# Patient Record
Sex: Male | Born: 1945 | Race: White | Hispanic: No | Smoking: Current every day smoker
Health system: Southern US, Community
[De-identification: ages and names within clinical notes are randomized; demographics above are authoritative.]

## PROBLEM LIST (undated history)

## (undated) DIAGNOSIS — L97919 Non-pressure chronic ulcer of unspecified part of right lower leg with unspecified severity: Secondary | ICD-10-CM

## (undated) DIAGNOSIS — I83019 Varicose veins of right lower extremity with ulcer of unspecified site: Secondary | ICD-10-CM

## (undated) DIAGNOSIS — I4729 Other ventricular tachycardia: Secondary | ICD-10-CM

## (undated) DIAGNOSIS — I255 Ischemic cardiomyopathy: Principal | ICD-10-CM

## (undated) DIAGNOSIS — I5022 Chronic systolic (congestive) heart failure: Secondary | ICD-10-CM

## (undated) DIAGNOSIS — D696 Thrombocytopenia, unspecified: Secondary | ICD-10-CM

## (undated) DIAGNOSIS — Z720 Tobacco use: Secondary | ICD-10-CM

## (undated) DIAGNOSIS — Z95810 Presence of automatic (implantable) cardiac defibrillator: Secondary | ICD-10-CM

## (undated) DIAGNOSIS — J9610 Chronic respiratory failure, unspecified whether with hypoxia or hypercapnia: Principal | ICD-10-CM

## (undated) DIAGNOSIS — I272 Pulmonary hypertension, unspecified: Secondary | ICD-10-CM

## (undated) DIAGNOSIS — J449 Chronic obstructive pulmonary disease, unspecified: Secondary | ICD-10-CM

## (undated) DIAGNOSIS — I10 Essential (primary) hypertension: Secondary | ICD-10-CM

## (undated) DIAGNOSIS — I472 Ventricular tachycardia: Secondary | ICD-10-CM

## (undated) DIAGNOSIS — R55 Syncope and collapse: Secondary | ICD-10-CM

## (undated) DIAGNOSIS — L97929 Non-pressure chronic ulcer of unspecified part of left lower leg with unspecified severity: Secondary | ICD-10-CM

## (undated) DIAGNOSIS — I83029 Varicose veins of left lower extremity with ulcer of unspecified site: Secondary | ICD-10-CM

## (undated) DIAGNOSIS — C189 Malignant neoplasm of colon, unspecified: Secondary | ICD-10-CM

## (undated) DIAGNOSIS — I2510 Atherosclerotic heart disease of native coronary artery without angina pectoris: Secondary | ICD-10-CM

## (undated) DIAGNOSIS — I959 Hypotension, unspecified: Secondary | ICD-10-CM

## (undated) HISTORY — PX: CORONARY ANGIOPLASTY: SHX604

## (undated) HISTORY — DX: Tobacco use: Z72.0

## (undated) HISTORY — DX: Ischemic cardiomyopathy: I25.5

## (undated) HISTORY — PX: COLON SURGERY: SHX602

## (undated) HISTORY — DX: Malignant neoplasm of colon, unspecified: C18.9

## (undated) HISTORY — DX: Chronic obstructive pulmonary disease, unspecified: J44.9

---

## 1995-01-30 HISTORY — PX: OTHER SURGICAL HISTORY: SHX169

## 1995-01-30 HISTORY — PX: CORONARY ARTERY BYPASS GRAFT: SHX141

## 2003-09-25 ENCOUNTER — Inpatient Hospital Stay (HOSPITAL_COMMUNITY): Admission: EM | Admit: 2003-09-25 | Discharge: 2003-09-29 | Payer: Self-pay | Admitting: Emergency Medicine

## 2009-03-23 ENCOUNTER — Encounter: Admission: RE | Admit: 2009-03-23 | Discharge: 2009-03-23 | Payer: Self-pay | Admitting: Family Medicine

## 2010-11-16 NOTE — Discharge Summary (Signed)
NAME:  James Moon, James Moon                        ACCOUNT NO.:  [id number]   MEDICAL RECORD NO.:  [id number]                   PATIENT TYPE:  INP   LOCATION:  3702                                 FACILITY:  [facility]   PHYSICIAN:  Jackie Plum, M.D.             DATE OF BIRTH:  March 15, 1946   DATE OF ADMISSION:  09/24/2003  DATE OF DISCHARGE:                                 DISCHARGE SUMMARY   ADDENDUM:  The patient was to be discharged yesterday, however, on account  of downward trend of his platelet count, he was evaluated by hematology.  The etiology of his thrombocytopenia is overall unclear.  Hematology saw the  patient and they recommended __________ and holding aspirin for now until  the patient is seen by his PCP within one week.  Advised hematology followup  should there be further drop in his platelet count at the outpatient level.  The platelet count was then repeated.  The discharge platelet count is as  noted on the previous discharge summary.  This morning, the patient does not  have any complaints.  He does not have any evidence of any bleeding  diathesis.  His vital signs are within normal limits.  The CBG is 106 mg/dl.  Saturation is 92% on room air.  He is discharged home in stable and  satisfactory condition.                                                Jackie Plum, M.D.    GO/MEDQ  D:  09/29/2003  T:  09/29/2003  Job:  [id number]

## 2010-11-16 NOTE — Discharge Summary (Signed)
NAME:  James Moon, James Moon                        ACCOUNT NO.:  [id number]   MEDICAL RECORD NO.:  [id number]                   PATIENT TYPE:  INP   LOCATION:  3702                                 FACILITY:  [facility]   PHYSICIAN:  James Moon, M.D.             DATE OF BIRTH:  Jul 15, 1945   DATE OF ADMISSION:  09/24/2003  DATE OF DISCHARGE:  09/28/2003                                 DISCHARGE SUMMARY   DISCHARGE DIAGNOSES:  1. Diabetic ketoacidosis, resolved.  2. Altered mental status secondary to diabetic ketoacidosis, resolved.  3. Dehydration with prerenal azotemia secondary to diabetic ketoacidosis,     resolved.  4. Hypothermia secondary to diabetic ketoacidosis, resolved.  The TSH was     within normal limits this hospitalization.  5. Hypotension secondary to dehydration with volume depletion, resolved.  6. History of insulin-dependent diabetes.  7. History of coronary artery disease, status post coronary artery bypass     graft 10 years ago.  8. History of colon cancer, status post resection.  9. History of ongoing severe smoking.     a. The patient counseled during hospitalization about the need to stop        cigarette smoking and the effect of smoking on his general well being.        Outpatient continued reinforcement and counseling recommended per        primary care physician.  10.      Thrombocytopenia.  11.      Rhabdomyolysis secondary to infectious etiology, resolving.   DISCHARGE MEDICATIONS:  The patient is being discharged home on regular  insulin 25 units subcutaneously and 25 units of Lente subcutaneously before  breakfast and at bedtime.  The patient is going to continue his aspirin 325  mg p.o. daily.  New medicines include Avelox 400 mg p.o. daily and Protonix  40 mg p.o. daily.   ACTIVITY:  As tolerated.   DIET:  To be a 2000 calorie ADA diet.   SPECIAL INSTRUCTIONS:  The patient is to report to M.D. if he experiences  any problems with chills or  difficulty breathing.   FOLLOWUP:  A followup appointment will be with his primary care physician,  Dr. Dagoberto Moon, early next week.   REASON FOR HOSPITALIZATION:  Altered mental status change and DKA secondary  to acute bronchitis as the exacerbating factor.   HISTORY OF PRESENT ILLNESS:  The patient presented to the [facility] ED on  September 24, 2003, with generalized weakness, fever, poor appetite, and nausea.  He was brought to the ED because he had been found confused on the floor and  could not be engaged in any meaningful conversation.  The history at that  time was mainly given by the patient's daughter on account of mental status  changes.  However, when the patient came around with resolution of his  mental status changes, he indicated having some cough which was productive  of  yellowish sputum prior to the onset of his worsening symptomatology.  He  also noted that he had not been feeling well for several weeks prior to  admission and was seen at an urgent care where it was felt that he had the  flu.  He had been vomiting as well.   PHYSICAL EXAMINATION:  VITAL SIGNS:  On admission, the patient's BP was  84/48 with a respiratory rate of 25 per minute and a temperature of 91.9  degrees Fahrenheit.  GENERAL APPEARANCE:  According to the admission H&P by Dr. Soyla Moon, the  patient had a shaking spell, was incomprehensible, and could not follow  simple commands.  HEENT:  His oropharynx was dry.  NECK:  Exam was notable for vesicular breath sounds.  CARDIAC:  Exam did not reveal any gallops or murmur.  ABDOMEN:  Soft and nontender.  EXTREMITIES:  Exam did not reveal any edema.  His skin exam did not reveal  any rashes.   LABORATORY DATA:  His admission laboratory work was notable for hyponatremia  at 126 with a glucose of 1155 mg/dl.  He had a CO2 of 12.  His liver  function tests were unremarkable.  He had a white count of 17,300 with a  hemoglobin of 16.9, hematocrit 58.6, and  platelet count of 188.  The initial  set of cardiac enzymes were unremarkable.  The EKG showed sinus rhythm at 61  without any obvious acute ST-T changes in contiguous leads.  On account of  hypoglycemia with acidosis with a pH of 7.1 and increased anion gap, the  patient was admitted to the hospital for altered mental status change with  dehydration and volume depletion secondary to diabetic ketoacidosis.   HOSPITAL COURSE:  Problem #1 - DIABETIC KETOACIDOSIS:  He was admitted to a  telemetry bed.  Aggressive IV fluid resuscitation was instituted on account  of his hypothermia.  ____________ was done, which was within normal limits.  He was put on a warming blankets.  Antibiotics were given in view of his  leukocytosis and concerns for possible sepsis.  His x-rays showed no acute  __________, however, there were increased bronchial markings consistent with  acute bronchitis.  The recommended protocol was instituted with IV regular  insulin.  His electrolytes were judiciously monitored and corrected as  needed.  He did not have any evidence of chest pain or evidence of ischemia.  By the next day, the patient's anion gap had closed and appropriate  adjustments in his insulin management were instituted with sliding scale  insulin and Lantus at night.  Urine cultures done on admission.  The final  report came out to be negative without any growth.  Blood cultures drawn on  September 25, 2003, had no growth any bugs at the time of discharge.  The final  report is pending.  It will need to be followed up at the outpatient level.  The patient is feeling well today.  His endurance level and energy level  have improved.  He is able to take a full diet and maintain this without any  problems.  He has been ambulatory without any problems.  His glucose levels  have improved.   Problem #2 - RHABDOMYOLYSIS:  The patient's CPK was more than 10 on admission and came down some during the hospitalization.   The cause of his  mild rhabdomyolysis is believed to be related to an infectious etiology  (acute bronchitis).  Metabolic causes could also be contributory.  The  patient is going home to continue increased fluid intake.  Total CPK and  renal function will need to be evaluated at the first outpatient visit.  The  patient's discharge CPK is 955.   Problem #3 - THROMBOCYTOPENIA:  The patient's platelet count decreased down  to 77,000 ___________ yesterday morning.  I believe that this may be related  to his acute illness.  There is no evidence of any petechiae or any skin  changes.  He does not have any evidence of acute bleeding.  The platelet  count will be repeated at discharge.  We will also take the opportunity to  get a peripheral blood smear with pathology review post discharge.  He will  be discharged home today.  This will be checked further at the outpatient  level.   On rounds this morning, James Moon is feeling well.  He does not have any  complaints of nausea, vomiting, fever, or chills.  He has no chest pain or  shortness of breath.  His BP is 121/68 mmHg.  His pulse rate is 74 per  minute.  His temperature is 97.9 degrees Fahrenheit.  His CBG was 89 mg/dl  this morning.  He is not in acute distress.  His lungs were clear to  auscultation.  The cardiac exam was notable for a regular rate and rhythm  without any gallops or murmur.  The abdomen was soft and nontender.  Bowel  sounds were present.  They were normoactive.  The extremity exam did not  reveal any edema or cords.  He is alert and oriented x 3.  No acute focal  deficits.  Laboratory work drawn at 0500 hours yesterday morning was notable  for resolution of his leukocytosis with a discharge WBC count of 9.2, a  hemoglobin of 16.5, a hematocrit of 48.6, an MCV of 93.9, and a platelet  count of 77.  The sodium was 149, potassium 4.0, chloride 116, CO2 28,  glucose 208, BUN 26, creatinine 1.0, and calcium 8.1.  Total  CPK 955, MB  26.5, troponin I 0.05.  The total cholesterol was 139, triglycerides 160,  HDL 21, and LDL 86.   His DKA has resolved.  He does not have any significant cardiopulmonary  symptomatology.  His hypothermia is also resolved.  He is deemed appropriate  for discharge today after repeat his CBC as noted above.  The patient is  self-pay.  We therefore asked care management to help him with his  medications.   James Moon' daughter had mentioned that he had not been taking good care  of himself.  Therefore, I took this opportunity to discuss all of the workup  done, the rationale behind this workup, the tailored point of care  recommended at the outpatient level, and the need to be compliant with his  medications and dietary restrictions.  He does not wish to be prescribed a  nicotine patch.  Therefore, continued outpatient encouragement and  reinforcement to be instituted as mentioned above.  DISPOSITION:  James Moon is going home.   CONDITION ON DISCHARGE:  Improved.                                                James Moon, M.D.    GO/MEDQ  D:  09/28/2003  T:  09/28/2003  Job:  [id number]   cc:  Alfonse Alpers. James Moon, M.D.  [address]  [place]  [place] [address]  Fax: [phone]

## 2011-06-01 DIAGNOSIS — I255 Ischemic cardiomyopathy: Secondary | ICD-10-CM

## 2011-06-01 HISTORY — DX: Ischemic cardiomyopathy: I25.5

## 2011-06-01 HISTORY — PX: CORONARY STENT PLACEMENT: SHX1402

## 2011-06-07 ENCOUNTER — Encounter: Payer: Self-pay | Admitting: *Deleted

## 2011-06-07 ENCOUNTER — Emergency Department (HOSPITAL_COMMUNITY): Payer: Medicare Other

## 2011-06-07 ENCOUNTER — Other Ambulatory Visit: Payer: Self-pay

## 2011-06-07 ENCOUNTER — Inpatient Hospital Stay (HOSPITAL_COMMUNITY)
Admission: EM | Admit: 2011-06-07 | Discharge: 2011-06-14 | DRG: 246 | Disposition: A | Discharge status: Home Health | Payer: Medicare Other | Attending: Cardiovascular Disease | Admitting: Cardiovascular Disease

## 2011-06-07 DIAGNOSIS — I10 Essential (primary) hypertension: Secondary | ICD-10-CM | POA: Diagnosis present

## 2011-06-07 DIAGNOSIS — Z951 Presence of aortocoronary bypass graft: Secondary | ICD-10-CM

## 2011-06-07 DIAGNOSIS — E875 Hyperkalemia: Secondary | ICD-10-CM | POA: Diagnosis present

## 2011-06-07 DIAGNOSIS — J449 Chronic obstructive pulmonary disease, unspecified: Secondary | ICD-10-CM

## 2011-06-07 DIAGNOSIS — E872 Acidosis, unspecified: Secondary | ICD-10-CM | POA: Diagnosis present

## 2011-06-07 DIAGNOSIS — Z794 Long term (current) use of insulin: Secondary | ICD-10-CM

## 2011-06-07 DIAGNOSIS — I5023 Acute on chronic systolic (congestive) heart failure: Secondary | ICD-10-CM | POA: Diagnosis present

## 2011-06-07 DIAGNOSIS — J4489 Other specified chronic obstructive pulmonary disease: Secondary | ICD-10-CM | POA: Diagnosis present

## 2011-06-07 DIAGNOSIS — J9610 Chronic respiratory failure, unspecified whether with hypoxia or hypercapnia: Secondary | ICD-10-CM | POA: Diagnosis present

## 2011-06-07 DIAGNOSIS — IMO0001 Reserved for inherently not codable concepts without codable children: Secondary | ICD-10-CM

## 2011-06-07 DIAGNOSIS — E119 Type 2 diabetes mellitus without complications: Secondary | ICD-10-CM | POA: Diagnosis present

## 2011-06-07 DIAGNOSIS — I214 Non-ST elevation (NSTEMI) myocardial infarction: Secondary | ICD-10-CM

## 2011-06-07 DIAGNOSIS — I2510 Atherosclerotic heart disease of native coronary artery without angina pectoris: Secondary | ICD-10-CM

## 2011-06-07 DIAGNOSIS — Z720 Tobacco use: Secondary | ICD-10-CM

## 2011-06-07 DIAGNOSIS — F17200 Nicotine dependence, unspecified, uncomplicated: Secondary | ICD-10-CM | POA: Diagnosis present

## 2011-06-07 DIAGNOSIS — I2589 Other forms of chronic ischemic heart disease: Secondary | ICD-10-CM | POA: Diagnosis present

## 2011-06-07 DIAGNOSIS — I5022 Chronic systolic (congestive) heart failure: Secondary | ICD-10-CM

## 2011-06-07 DIAGNOSIS — I498 Other specified cardiac arrhythmias: Secondary | ICD-10-CM | POA: Diagnosis present

## 2011-06-07 DIAGNOSIS — Z85038 Personal history of other malignant neoplasm of large intestine: Secondary | ICD-10-CM

## 2011-06-07 DIAGNOSIS — J90 Pleural effusion, not elsewhere classified: Secondary | ICD-10-CM | POA: Diagnosis present

## 2011-06-07 DIAGNOSIS — I509 Heart failure, unspecified: Secondary | ICD-10-CM

## 2011-06-07 DIAGNOSIS — Z7982 Long term (current) use of aspirin: Secondary | ICD-10-CM

## 2011-06-07 DIAGNOSIS — I951 Orthostatic hypotension: Secondary | ICD-10-CM | POA: Diagnosis present

## 2011-06-07 DIAGNOSIS — Z833 Family history of diabetes mellitus: Secondary | ICD-10-CM

## 2011-06-07 HISTORY — DX: Atherosclerotic heart disease of native coronary artery without angina pectoris: I25.10

## 2011-06-07 HISTORY — DX: Essential (primary) hypertension: I10

## 2011-06-07 LAB — POCT I-STAT 3, VENOUS BLOOD GAS (G3P V): Acid-Base Excess: 3 mmol/L — ABNORMAL HIGH (ref 0.0–2.0)

## 2011-06-07 LAB — CARDIAC PANEL(CRET KIN+CKTOT+MB+TROPI)
CK, MB: 28.5 ng/mL (ref 0.3–4.0)
Relative Index: 7.6 — ABNORMAL HIGH (ref 0.0–2.5)
Total CK: 288 U/L — ABNORMAL HIGH (ref 7–232)
Troponin I: 1.08 ng/mL (ref <0.30)

## 2011-06-07 LAB — COMPREHENSIVE METABOLIC PANEL
ALT: 43 U/L (ref 0–53)
AST: 53 U/L — ABNORMAL HIGH (ref 0–37)
Albumin: 3.1 g/dL — ABNORMAL LOW (ref 3.5–5.2)
BUN: 28 mg/dL — ABNORMAL HIGH (ref 6–23)
CO2: 29 mEq/L (ref 19–32)
Calcium: 8.8 mg/dL (ref 8.4–10.5)
Creatinine, Ser: 0.92 mg/dL (ref 0.50–1.35)
Glucose, Bld: 171 mg/dL — ABNORMAL HIGH (ref 70–99)
Potassium: 5.2 mEq/L — ABNORMAL HIGH (ref 3.5–5.1)
Total Protein: 7.1 g/dL (ref 6.0–8.3)

## 2011-06-07 LAB — CBC
HCT: 57.7 % — ABNORMAL HIGH (ref 39.0–52.0)
Hemoglobin: 18.1 g/dL — ABNORMAL HIGH (ref 13.0–17.0)
MCH: 31.4 pg (ref 26.0–34.0)
MCH: 31.6 pg (ref 26.0–34.0)
MCHC: 31.5 g/dL (ref 30.0–36.0)
MCHC: 31.9 g/dL (ref 30.0–36.0)
MCV: 99.7 fL (ref 78.0–100.0)
MCV: 99 fL (ref 78.0–100.0)
Platelets: 140 10*3/uL — ABNORMAL LOW (ref 150–400)
RBC: 5.76 MIL/uL (ref 4.22–5.81)
RDW: 15.1 % (ref 11.5–15.5)
WBC: 7.4 10*3/uL (ref 4.0–10.5)

## 2011-06-07 LAB — PRO B NATRIURETIC PEPTIDE: Pro B Natriuretic peptide (BNP): 1498 pg/mL — ABNORMAL HIGH (ref 0–125)

## 2011-06-07 LAB — HEMOGLOBIN A1C: Mean Plasma Glucose: 197 mg/dL — ABNORMAL HIGH (ref <117)

## 2011-06-07 MED ORDER — ASPIRIN 81 MG PO CHEW
324.0000 mg | CHEWABLE_TABLET | Freq: Once | ORAL | Status: AC
  Administered 2011-06-07: 243 mg via ORAL
  Filled 2011-06-07: qty 4

## 2011-06-07 MED ORDER — ASPIRIN 81 MG PO CHEW
324.0000 mg | CHEWABLE_TABLET | Freq: Once | ORAL | Status: DC
  Filled 2011-06-07 (×2): qty 1

## 2011-06-07 MED ORDER — FUROSEMIDE 10 MG/ML IJ SOLN
40.0000 mg | Freq: Two times a day (BID) | INTRAMUSCULAR | Status: DC
  Administered 2011-06-08 – 2011-06-10 (×6): 40 mg via INTRAVENOUS
  Filled 2011-06-07 (×9): qty 4

## 2011-06-07 MED ORDER — SODIUM CHLORIDE 0.9 % IJ SOLN
3.0000 mL | Freq: Two times a day (BID) | INTRAMUSCULAR | Status: DC
  Administered 2011-06-08 – 2011-06-10 (×4): 3 mL via INTRAVENOUS

## 2011-06-07 MED ORDER — SODIUM CHLORIDE 0.9 % IV SOLN: 250.0000 mL | INTRAVENOUS | Status: DC | PRN

## 2011-06-07 MED ORDER — SODIUM CHLORIDE 0.9 % IJ SOLN: 3.0000 mL | INTRAMUSCULAR | Status: DC | PRN

## 2011-06-07 MED ORDER — INSULIN ASPART 100 UNIT/ML ~~LOC~~ SOLN
0.0000 [IU] | Freq: Three times a day (TID) | SUBCUTANEOUS | Status: DC
  Administered 2011-06-08 (×2): 3 [IU] via SUBCUTANEOUS
  Administered 2011-06-08: 1 [IU] via SUBCUTANEOUS
  Administered 2011-06-09 (×2): 9 [IU] via SUBCUTANEOUS
  Administered 2011-06-09: 5 [IU] via SUBCUTANEOUS
  Administered 2011-06-10: 9 [IU] via SUBCUTANEOUS
  Administered 2011-06-10: 5 [IU] via SUBCUTANEOUS
  Administered 2011-06-10: 7 [IU] via SUBCUTANEOUS
  Administered 2011-06-11: 2 [IU] via SUBCUTANEOUS
  Administered 2011-06-11: 7 [IU] via SUBCUTANEOUS
  Administered 2011-06-11 – 2011-06-12 (×2): 5 [IU] via SUBCUTANEOUS
  Filled 2011-06-07: qty 3

## 2011-06-07 MED ORDER — ALBUTEROL SULFATE (5 MG/ML) 0.5% IN NEBU
2.5000 mg | INHALATION_SOLUTION | Freq: Once | RESPIRATORY_TRACT | Status: AC
Start: 2011-06-07 — End: 2011-06-07
  Administered 2011-06-07: 2.5 mg via RESPIRATORY_TRACT
  Filled 2011-06-07: qty 0.5

## 2011-06-07 MED ORDER — IPRATROPIUM BROMIDE 0.02 % IN SOLN: 0.5000 mg | RESPIRATORY_TRACT | Status: DC | PRN

## 2011-06-07 MED ORDER — ROSUVASTATIN CALCIUM 20 MG PO TABS
20.0000 mg | ORAL_TABLET | Freq: Every day | ORAL | Status: DC
  Administered 2011-06-08 – 2011-06-13 (×6): 20 mg via ORAL
  Filled 2011-06-07 (×8): qty 1

## 2011-06-07 MED ORDER — HEPARIN BOLUS VIA INFUSION
4000.0000 [IU] | Freq: Once | INTRAVENOUS | Status: AC
  Administered 2011-06-07: 4000 [IU] via INTRAVENOUS
  Filled 2011-06-07: qty 4000

## 2011-06-07 MED ORDER — NITROGLYCERIN 0.4 MG SL SUBL: 0.4000 mg | SUBLINGUAL_TABLET | SUBLINGUAL | Status: DC | PRN

## 2011-06-07 MED ORDER — LISINOPRIL 5 MG PO TABS
5.0000 mg | ORAL_TABLET | Freq: Every day | ORAL | Status: DC
  Administered 2011-06-08 – 2011-06-09 (×2): 5 mg via ORAL
  Filled 2011-06-07 (×3): qty 1

## 2011-06-07 MED ORDER — ALBUTEROL SULFATE (5 MG/ML) 0.5% IN NEBU
2.5000 mg | INHALATION_SOLUTION | RESPIRATORY_TRACT | Status: DC
  Administered 2011-06-08: 2.5 mg via RESPIRATORY_TRACT
  Filled 2011-06-07 (×2): qty 0.5

## 2011-06-07 MED ORDER — ONDANSETRON HCL 4 MG/2ML IJ SOLN: 4.0000 mg | Freq: Four times a day (QID) | INTRAMUSCULAR | Status: DC | PRN

## 2011-06-07 MED ORDER — FUROSEMIDE 10 MG/ML IJ SOLN
40.0000 mg | Freq: Once | INTRAMUSCULAR | Status: AC
  Administered 2011-06-07: 40 mg via INTRAVENOUS
  Filled 2011-06-07: qty 4

## 2011-06-07 MED ORDER — ACETAMINOPHEN 325 MG PO TABS: 650.0000 mg | ORAL_TABLET | ORAL | Status: DC | PRN

## 2011-06-07 MED ORDER — ASPIRIN 81 MG PO CHEW
81.0000 mg | CHEWABLE_TABLET | Freq: Every day | ORAL | Status: DC
  Administered 2011-06-08 – 2011-06-14 (×5): 81 mg via ORAL
  Filled 2011-06-07 (×4): qty 1

## 2011-06-07 MED ORDER — NITROGLYCERIN 2 % TD OINT
1.0000 [in_us] | TOPICAL_OINTMENT | Freq: Once | TRANSDERMAL | Status: AC
  Administered 2011-06-07: 1 [in_us] via TOPICAL
  Filled 2011-06-07: qty 1

## 2011-06-07 MED ORDER — CARVEDILOL 3.125 MG PO TABS
3.1250 mg | ORAL_TABLET | Freq: Two times a day (BID) | ORAL | Status: DC
  Administered 2011-06-08 – 2011-06-14 (×11): 3.125 mg via ORAL
  Filled 2011-06-07 (×17): qty 1

## 2011-06-07 MED ORDER — HEPARIN SOD (PORCINE) IN D5W 100 UNIT/ML IV SOLN
1200.0000 [IU]/h | INTRAVENOUS | Status: DC
  Administered 2011-06-07: 900 [IU]/h via INTRAVENOUS
  Administered 2011-06-08 – 2011-06-10 (×4): 1200 [IU]/h via INTRAVENOUS
  Filled 2011-06-07 (×7): qty 250

## 2011-06-07 NOTE — ED Notes (Signed)
Cardiology paged for positive Troponin.

## 2011-06-07 NOTE — ED Notes (Signed)
20 gauge in R hand removed due to flushing with difficulty, catheter intact upon removal, pt tolerated procedure well

## 2011-06-07 NOTE — ED Provider Notes (Signed)
History     CSN: [id number] Arrival date & time: 06/07/2011 11:07 AM   First MD Initiated Contact with Patient 06/07/11 1117      Chief Complaint  Patient presents with  . Dizziness    (Consider location/radiation/quality/duration/timing/severity/associated sxs/prior treatment) HPI Patient presents following a near syncopal episode. He notes that over the past 2-3 weeks he has had persistent dyspnea, decreasing exercise capacity, generalized fatigue. These symptoms began insidiously, since onset has worsened without clear provoking factors, and are not relieved by anything. Just prior to presentation the patient that over to pick something up, felt acutely lightheaded, got to the floor and called 911. No chest pain, no nausea, no vomiting, no disorientation, no fevers, no chills. The patient is an insulin-dependent diabetic, with a history of colon cancer as well as long smoking history. Past Medical History  Diagnosis Date  . Diabetes mellitus   . Cancer   . Hypertension     Past Surgical History  Procedure Date  . Colon surgery   . Open heart surgery     History reviewed. No pertinent family history.  History  Substance Use Topics  . Smoking status: Current Everyday Smoker -- 1.0 packs/day  . Smokeless tobacco: Not on file  . Alcohol Use: No      Review of Systems  Constitutional: Negative for fever and chills.  HENT: Negative for sore throat.   Eyes: Negative for visual disturbance.  Respiratory: Positive for shortness of breath. Negative for cough.   Cardiovascular: Positive for leg swelling. Negative for chest pain.  Gastrointestinal: Negative for nausea.  Genitourinary: Negative for dysuria.  Musculoskeletal: Negative for myalgias.  Neurological: Negative for headaches.  Psychiatric/Behavioral: Negative.     Allergies  Review of patient's allergies indicates no known allergies.  Home Medications  No current outpatient prescriptions on file.  There  were no vitals taken for this visit.  Physical Exam  HENT:  Head: Normocephalic and atraumatic.  Eyes: Conjunctivae are normal. Pupils are equal, round, and reactive to light.  Cardiovascular: Tachycardia present.   Pulmonary/Chest: Tachypnea noted. He has decreased breath sounds. He has wheezes.  Abdominal: Soft. He exhibits no distension.  Musculoskeletal: He exhibits no edema and no tenderness.  Neurological: He is alert.  Skin: Skin is warm and dry.  Psychiatric: He has a normal mood and affect.    ED Course  Procedures (including critical care time)   Labs Reviewed  POCT B-TYPE NATRIURETIC PEPTIDE(BNP)  CBC  CARDIAC PANEL(CRET KIN+CKTOT+MB+TROPI)  COMPREHENSIVE METABOLIC PANEL  MAGNESIUM  BLOOD GAS, VENOUS   No results found.   No diagnosis found.   Date: 06/07/2011  Rate:116  Rhythm: sinus tachycardia  QRS Axis: left  Intervals: normal  ST/T Wave abnormalities: nonspecific T wave changes  Conduction Disutrbances:nonspecific intraventricular conduction delay  Narrative Interpretation:   Old EKG Reviewed: unchanged  Pulse ox 97% on 2 L Cardiac monitor 120, sinus tach both abnormal  Cxr: fluid overloaded -reviewed by me  CRITICAL CARE Performed by: Gerhard Munch   Total critical care time: 35  Critical care time was exclusive of separately billable procedures and treating other patients.  Critical care was necessary to treat or prevent imminent or life-threatening deterioration.  Critical care was time spent personally by me on the following activities: development of treatment plan with patient and/or surrogate as well as nursing, discussions with consultants, evaluation of patient's response to treatment, examination of patient, obtaining history from patient or surrogate, ordering and performing treatments and interventions, ordering and  review of laboratory studies, ordering and review of radiographic studies, pulse oximetry and re-evaluation of  patient's condition.   MDM  This 65 year old male with history of prior cardiac intervention now presents following a syncopal episode. Prior to today's episode the patient notes prodromal generalized fatigue and decreasing energy.  On initial exam the patient is tachycardic tachypneic hypoxic. With this presentation of concern for ACS versus CHF versus infectious etiology. The patient's labs are notable for cardiac ischemia. The patient's chest x-ray is suggestive of overload status. Given these findings, the patient's persistent abnormal vital signs and his acidosis will be admitted for further evaluation by the cardiology service.        Gerhard Munch, MD 06/07/11 [phone]

## 2011-06-07 NOTE — ED Notes (Signed)
Talked with Cardiology about positive troponin, informed to keep the bed assignment the same.

## 2011-06-07 NOTE — Progress Notes (Signed)
PHARMACY - ANTICOAGULATION CONSULT INITIAL NOTE  Pharmacy Consult for: Heparin  Indication: chest pain/ACS    Patient Data:   Allergies: No Known Allergies  Patient Measurements: Height: 5\' 10"  (177.8 cm) Weight: 165 lb (74.844 kg) IBW/kg (Calculated) : 73  Adjusted Body Weight: 74.8 kg  Vital Signs: Temp:  [98.3 F (36.8 C)] 98.3 F (36.8 C) (12/07 1115) Pulse Rate:  [110] 110  (12/07 1115) BP: (134)/(65) 134/65 mmHg (12/07 1115) SpO2:  [98 %] 98 % (12/07 1115) Weight:  [165 lb (74.844 kg)] 165 lb (74.844 kg) (12/07 1500)  Intake/Output from previous day: No intake or output data in the 24 hours ending 06/07/11 1619  Labs:  Basename 06/07/11 1153 06/07/11 1146  HGB -- 18.1*  HCT -- 57.4*  PLT -- 154  APTT -- --  LABPROT -- --  INR -- --  HEPARINUNFRC -- --  CREATININE -- 0.92  CKTOTAL 288* --  CKMB 21.9* --  TROPONINI 0.39* --   Estimated Creatinine Clearance: 82.7 ml/min (by C-G formula based on Cr of 0.92).  Medical History: Past Medical History  Diagnosis Date  . Diabetes mellitus   . Cancer   . Hypertension   . CAD (coronary artery disease)     CABG in 1996    Scheduled medications:     . albuterol  2.5 mg Nebulization Once  . albuterol  2.5 mg Nebulization Q4H  . aspirin  324 mg Oral Once  . aspirin  324 mg Oral Once  . aspirin  81 mg Oral Daily  . carvedilol  3.125 mg Oral BID WC  . furosemide  40 mg Intravenous Once  . furosemide  40 mg Intravenous BID  . insulin aspart  0-9 Units Subcutaneous TID WC  . lisinopril  5 mg Oral Daily  . nitroGLYCERIN  1 inch Topical Once  . rosuvastatin  20 mg Oral q1800  . sodium chloride  3 mL Intravenous Q12H     Assessment:  65 y.o. male admitted on 06/07/2011, with NSTEMI. Pharmacy consulted to manage IV heparin.  Goal of Therapy:  1. Heparin level 0.3-0.7 units/ml  Plan:  1. Heparin IV bolus of 4000 units x 1, then IV infusion of 900 units./hr. 2. Heparin level 6 hours after starting  heparin. 3. Daily CBC, heparin level  Chrissy Ealey C. Thad Ranger, PharmD 06/07/2011, 4:19 PM

## 2011-06-07 NOTE — ED Notes (Signed)
Patient states he was getting dressed this am c/o dizziness was walking to the door and had a near syncopal episode dropped to his knees with sob, Denies chest pain no n/v. States  He has been having some dizziness with swelling to his lower ext. X 1 month. States he had already eaten this am and has took his insulin. Cbg was good this am.

## 2011-06-07 NOTE — Consult Note (Addendum)
Cardiology Consult Note    History and Physical   Patient ID: [facility] MRN: [id number], DOB/AGE: 07/15/1963   Admit date: 06/07/2011 Date of Consult: 06/07/2011  Primary Physician: PA at [facility] Primary Cardiologist: None  Pt. Profile: [facility] is a 65 yo with PMHx significant for CAD (CABG x 4 in 1996 utilizing SVGs harvested from R leg), IDDM, HTN, tobacco abuse (50 pack-year hx) and colon cancer (s/p colectomy) presenting to [facility] via EMS with worsening SOB, dizziness and weakness.    Problem List: Past Medical History  Diagnosis Date  . Diabetes mellitus   . Cancer   . Hypertension   . CAD (coronary artery disease)     CABG in 1996    Past Surgical History  Procedure Date  . Colon surgery   . Open heart surgery      Allergies: No Known Allergies  HPI:   He reports a 10-month history of worsening SOB accompanied by lightheadedness, weakness and occasional palpitations experienced after very mild exertion (bending over to tie shoes, walking from one end of the house to the other). He reports worsening ankle swelling, orthopnea, PND and nocturia. He denies chest pain, n/v, abdominal pain, cough, fevers, chills, changes in BM, blood in urine, bleeding, recent numbness, incoordination, imbalance, slurred speech, falling, extended travel and sick contacts.   He states that he has not followed a cardiologist in years and denies any cardiac work-up since open heart surgery. He used to follow a PA at [facility], but has not seen her in "a couple years" due to recent unemployment and lack of health insurance.  Today, he awoke, went to the bathroom and became lightheaded upon bending over. He became weak, short of breath and stumbled to his knees, but did not lose consciousness. 911 was called and he was transported to [facility] ED.  In the ED, EKG revealed questionable lateral ischemia, CK-MB and TnI positive at 21.9 and 0.39, respectively,  CXR revealed interstitial pulmonary edema and small bilateral pleural effusions, no cardiomegaly; of note, potassium, H/H mildly elevated; blood gases: pH 7.21, pCO2 89.3.  Inpatient Medications:     . albuterol  2.5 mg Nebulization Once  . aspirin  324 mg Oral Once  . furosemide  40 mg Intravenous Once  . nitroGLYCERIN  1 inch Topical Once  Outpatient Medications: ASA 81mg  Insulin  (Not in a hospital admission)  Family History  Problem Relation Age of Onset  . Heart attack Father   . Breast cancer Mother   . Diabetes Father      History   Social History  . Marital Status: Widowed    Spouse Name: N/A    Number of Children: N/A  . Years of Education: N/A   Occupational History  . Not on file.   Social History Main Topics  . Smoking status: Current Everyday Smoker -- 1.0 packs/day for 50 years  . Smokeless tobacco: Not on file  . Alcohol Use: No  . Drug Use: No  . Sexually Active: Not on file   Other Topics Concern  . Not on file   Social History Narrative   Lives in [place], has 1 child.  He is widowed .  He works at [employer].     Review of Systems: General: negative for chills, fever, night sweats or weight changes. Cardiovascular: negative for chest pain, positive for dyspnea on exertion, edema, orthopnea, palpitations, paroxysmal nocturnal dyspnea and shortness of breath Dermatological: negative for rash  Respiratory: negative for cough or wheezing Urologic: negative for hematuria Abdominal: negative for nausea, vomiting, diarrhea, bright red blood per rectum, melena, or hematemesis Neurologic: positive for pre-syncope and dizziness, negative for visual changes, syncope Musculoskeletal: positive for fasciculations and tetany All other systems reviewed and are otherwise negative except as noted above.  Physical Exam: Blood pressure 134/65, pulse 110, temperature 98.3 F (36.8 C), temperature source Oral, SpO2 98.00%.  General: Well developed, well  nourished, NAD Head: Normocephalic, atraumatic, sclera injected, pupils asymmetric ("since childhood" per patient), pupillary constriction direct and consensual bilaterally, EOM in tact bilaterally    Neck: Negative for carotid bruits. JVD not elevated. Supple.  Lungs: Rales noted in lower and mid lung bases, no wheezing or rhonchi , slightly decreased breath sounds Heart: Nondisplaced PMI, RRR with S1 S2. No murmurs, rubs, or gallops appreciated.  Abdomen: Soft, non-tender, non-distended with normoactive bowel sounds. No hepatomegaly. No rebound/guarding. No obvious abdominal masses. Msk:  Involuntary movements bilateral feet, strength and tone appears normal for age.  Extremities: 1+ edema bilateral ankles, no clubbing, cyanosis.  Distal pedal pulses are 2+ and equal bilaterally. Neuro: Alert and oriented X 3. Moves all extremities spontaneously. Psych:  Responds to questions appropriately with a normal affect.  Labs: Recent Labs  Basename 06/07/11 1146   WBC 7.6   HGB 18.1*   HCT 57.4*   MCV 99.7   PLT 154    Lab 06/07/11 1146  NA 135  K 5.2*  CL 98  CO2 29  BUN 28*  CREATININE 0.92  CALCIUM 8.8  PROT 7.1  BILITOT 0.2*  ALKPHOS 77  ALT 43  AST 53*  AMYLASE --  LIPASE --  GLUCOSE 171*   No results found for this basename: HGBA1C in the last 72 hours Recent Labs  Basename 06/07/11 1153   CKTOTAL 288*   CKMB 21.9*   CKMBINDEX --   TROPONINI 0.39*   Radiology/Studies: Dg Chest 2 View  06/07/2011  *RADIOLOGY REPORT*  Clinical Data: Near-syncope.  Shortness of breath.  CHEST - 2 VIEW  Comparison: Chest 03/23/2009.  Findings: There is interstitial pulmonary edema and small bilateral pleural effusions.  The patient is status post CABG.  Heart size normal.  IMPRESSION: Congestive heart failure.  Original Report Authenticated By: Bernadene Bell. D'ALESSIO, M.D.    EKG:  Sinus tachycardia at 119 bpm, LAD, ST depressions V5, V6, aVL  ASSESSMENT AND PLAN:   1. NSTEMI- pt with  multiple cardiac risk factors and known CAD in the past with poor cardiac follow-up and no recent cardiac work-up, positive initial CEs  - Will admit to telemetry  - Cycle cardiac enzymes  - Heparin gtt  - Continue home ASA  - Start BB, statin, ACEI  - NTG SL PRN if develops cp  - Plan for cath Monday  2. Acute congestive heart failure- likely in the setting of new ischemia, pt with rales on exam, pulmonary edema on CXR  - Will order BNP  - Lasix 40mg  IV BID  - Daily BMETs  - Strict I/Os, daily weights  - Will order 2D echo  - Hydration based on echo  3. Hyperkalemia- pt with fasciculations and mild tetany; K at 5.2 today  - Diuresing with Lasix  - Continue to monitor  4. IDDM  - SSI per protocol  5. Respiratory acidosis- pH 7.215, pCO2 89.3; O2 sat. 98% on 2L n/c; breathing well now  - Will order nebs  - PFTs after cath  - Repeat ABG in  a couple of days  6. Polycythemia- likely secondary to chronic hypoxemia  - Repeat CBC  7. Tobacco abuse- ~50 pack-year history, will need cessation counseling   Signed, R. Hurman Horn, PA-C 06/07/2011, 3:28 PM  Attending Note:   The patient was seen and examined.  Agree with assessment and plan as noted above.  This patient also has significant COPD.  His Hb. Is 18.  He has an acute respiratory acidosos.  His ABG also suggests that he has elevated CO2.  pH 7.215, pCO2 89.3; O2 sat. 98% on 2L n/c . Plan: Tune up over the weekend. Diurese. Aggressive pulmonary nebs, PFTs,  Cath on Monday   Vesta Mixer, Montez Hageman., MD, [facility] 06/07/2011, 3:30 PM

## 2011-06-07 NOTE — Progress Notes (Signed)
CHF home health consult occurs while inpatient. A referral is being sent to the CHF CM.

## 2011-06-08 ENCOUNTER — Other Ambulatory Visit: Payer: Self-pay

## 2011-06-08 DIAGNOSIS — I214 Non-ST elevation (NSTEMI) myocardial infarction: Secondary | ICD-10-CM

## 2011-06-08 LAB — HEPARIN LEVEL (UNFRACTIONATED)
Heparin Unfractionated: <0.1 IU/mL — ABNORMAL LOW (ref 0.30–0.70)
Heparin Unfractionated: 0.52 IU/mL (ref 0.30–0.70)

## 2011-06-08 LAB — BASIC METABOLIC PANEL
BUN: 25 mg/dL — ABNORMAL HIGH (ref 6–23)
Calcium: 9 mg/dL (ref 8.4–10.5)
Creatinine, Ser: 1.05 mg/dL (ref 0.50–1.35)
GFR calc non Af Amer: 73 mL/min — ABNORMAL LOW (ref >90)
Glucose, Bld: 102 mg/dL — ABNORMAL HIGH (ref 70–99)
Sodium: 139 mEq/L (ref 135–145)

## 2011-06-08 LAB — CARDIAC PANEL(CRET KIN+CKTOT+MB+TROPI)
CK, MB: 22.5 ng/mL (ref 0.3–4.0)
Relative Index: 6.7 — ABNORMAL HIGH (ref 0.0–2.5)
Relative Index: 7.6 — ABNORMAL HIGH (ref 0.0–2.5)
Total CK: 337 U/L — ABNORMAL HIGH (ref 7–232)
Troponin I: 1.63 ng/mL (ref <0.30)
Troponin I: 1.65 ng/mL (ref <0.30)

## 2011-06-08 LAB — GLUCOSE, CAPILLARY
Glucose-Capillary: 207 mg/dL — ABNORMAL HIGH (ref 70–99)
Glucose-Capillary: 226 mg/dL — ABNORMAL HIGH (ref 70–99)
Glucose-Capillary: 237 mg/dL — ABNORMAL HIGH (ref 70–99)
Glucose-Capillary: 57 mg/dL — ABNORMAL LOW (ref 70–99)
Glucose-Capillary: 77 mg/dL (ref 70–99)

## 2011-06-08 LAB — CBC
Hemoglobin: 16.5 g/dL (ref 13.0–17.0)
RBC: 5.35 MIL/uL (ref 4.22–5.81)
WBC: 8.1 10*3/uL (ref 4.0–10.5)

## 2011-06-08 MED ORDER — ALBUTEROL SULFATE (5 MG/ML) 0.5% IN NEBU
2.5000 mg | INHALATION_SOLUTION | Freq: Three times a day (TID) | RESPIRATORY_TRACT | Status: DC
  Administered 2011-06-08 – 2011-06-13 (×12): 2.5 mg via RESPIRATORY_TRACT
  Filled 2011-06-08 (×14): qty 0.5

## 2011-06-08 MED ORDER — HEPARIN BOLUS VIA INFUSION
2000.0000 [IU] | Freq: Once | INTRAVENOUS | Status: AC
  Administered 2011-06-08: 2000 [IU] via INTRAVENOUS
  Filled 2011-06-08: qty 2000

## 2011-06-08 NOTE — Progress Notes (Signed)
Pt arrival at the room via stretcher form ER. Alert and oriented x4. Ambulatory with standby assist due to weakness. Denies any chest pain upon assessment. Skin intact. From home with wife per pt. Pt oriented to room and equipment. No indications of distress noted at this time. Will cont to monitor.

## 2011-06-08 NOTE — Progress Notes (Signed)
ANTICOAGULATION CONSULT NOTE - Follow Up Consult  Pharmacy Consult for Heparin Indication: NSTEMI  No Known Allergies  Patient Measurements: Height: 5\' 10"  (177.8 cm) Weight: 167 lb 6.4 oz (75.932 kg) IBW/kg (Calculated) : 73   Vital Signs: Temp: 97.9 F (36.6 C) (12/08 0523) Temp src: Oral (12/08 0523) BP: 108/61 mmHg (12/08 0523) Pulse Rate: 103  (12/08 0523)  Labs:  Basename 06/08/11 0907 06/08/11 0500 06/07/11 2327 06/07/11 1643 06/07/11 1614 06/07/11 1146  HGB -- 16.5 -- -- 18.4* --  HCT -- 52.6* -- -- 57.7* 57.4*  PLT -- 149* -- -- 140* 154  APTT -- -- -- -- -- --  LABPROT -- -- -- 13.2 -- --  INR -- -- -- 0.98 -- --  HEPARINUNFRC 0.67 -- <0.10* -- -- --  CREATININE -- 1.05 -- -- -- 0.92  CKTOTAL 225 -- 337* -- 366* --  CKMB 17.1* -- 22.5* -- 28.5* --  TROPONINI 1.65* -- 1.63* -- 1.08* --   Estimated Creatinine Clearance: 72.4 ml/min (by C-G formula based on Cr of 1.05).   Medications:  Scheduled:     . albuterol  2.5 mg Nebulization Once  . albuterol  2.5 mg Nebulization Q4H  . aspirin  324 mg Oral Once  . aspirin  324 mg Oral Once  . aspirin  81 mg Oral Daily  . carvedilol  3.125 mg Oral BID WC  . furosemide  40 mg Intravenous Once  . furosemide  40 mg Intravenous BID  . heparin  2,000 Units Intravenous Once  . heparin  4,000 Units Intravenous Once  . insulin aspart  0-9 Units Subcutaneous TID WC  . lisinopril  5 mg Oral Daily  . nitroGLYCERIN  1 inch Topical Once  . rosuvastatin  20 mg Oral q1800  . sodium chloride  3 mL Intravenous Q12H   Infusions:     . heparin 1,200 Units/hr (06/08/11 0146)    Assessment: 65 yo male on heparin for NSTEMI. Heparin level is therapeutic. No bleeding noted. Plan is for cath Mon.  Goal of Therapy:  Heparin level 0.3-0.7 units/ml   Plan:  1. Continue heparin 1200 units/hr (12 ml/hr) 2. Confirmatory heparin level at 15:00  Lovell Sheehan PharmD BCPS 06/08/2011,11:08 AM

## 2011-06-08 NOTE — Progress Notes (Signed)
Patient ID: [facility], male   DOB: May 08, 1946, 65 y.o.   MRN: [id number] SUBJECTIVE:  [facility] was asleep when I walked in the room wearing 2 L of O2. [facility] has severe COPD his initial blood gas, elevated hematocrit, elevated CO2. I am concerned [facility] is retaining and developing CO2 narcosis.  [facility] has a 50-pack-year smoking history. [facility] came in with progressive lower extremity edema and had congestive heart failure on his chest x-ray. [facility] is ruled out for myocardial infarction. [facility] was acidotic on his blood gas.  His output is incomplete.Marland Kitchen [facility] still wearing his blue jeans.  Filed Vitals:   06/07/11 2302 06/08/11 0523 06/08/11 0700 06/08/11 0749  BP: 105/62 108/61    Pulse: 86 103    Temp: 98.3 F (36.8 C) 97.9 F (36.6 C)    TempSrc: Oral Oral    Resp: 18 20    Height: 5\' 10"  (1.778 m)     Weight: 74.9 kg (165 lb 2 oz)  75.932 kg (167 lb 6.4 oz)   SpO2: 94% 92%  94%    Intake/Output Summary (Last 24 hours) at 06/08/11 1312 Last data filed at 06/08/11 1191  Gross per 24 hour  Intake  828.1 ml  Output    400 ml  Net  428.1 ml    LABS: Basic Metabolic Panel:  Basename 06/08/11 0500 06/07/11 1146  NA 139 135  K 4.6 5.2*  CL 98 98  CO2 33* 29  GLUCOSE 102* 171*  BUN 25* 28*  CREATININE 1.05 0.92  CALCIUM 9.0 8.8  MG -- 2.0  PHOS -- --   Liver Function Tests:  Basename 06/07/11 1146  AST 53*  ALT 43  ALKPHOS 77  BILITOT 0.2*  PROT 7.1  ALBUMIN 3.1*   No results found for this basename: LIPASE:2,AMYLASE:2 in the last 72 hours CBC:  Basename 06/08/11 0500 06/07/11 1614  WBC 8.1 7.4  NEUTROABS -- --  HGB 16.5 18.4*  HCT 52.6* 57.7*  MCV 98.3 99.0  PLT 149* 140*   Cardiac Enzymes:  Basename 06/08/11 0907 06/07/11 2327 06/07/11 1614  CKTOTAL 225 337* 366*  CKMB 17.1* 22.5* 28.5*  CKMBINDEX -- -- --  TROPONINI 1.65* 1.63* 1.08*   BNP:  Basename 06/07/11 1453  POCBNP 1498.0*   D-Dimer: No results found for this basename: DDIMER:2 in the last 72  hours Hemoglobin A1C:  Basename 06/07/11 1614  HGBA1C 8.5*   Fasting Lipid Panel: No results found for this basename: CHOL,HDL,LDLCALC,TRIG,CHOLHDL,LDLDIRECT in the last 72 hours Thyroid Function Tests:  Basename 06/07/11 1614  TSH 1.661  T4TOTAL --  T3FREE --  THYROIDAB --   Anemia Panel: No results found for this basename: VITAMINB12,FOLATE,FERRITIN,TIBC,IRON,RETICCTPCT in the last 72 hours  RADIOLOGY: Dg Chest 2 View  06/07/2011  *RADIOLOGY REPORT*  Clinical Data: Near-syncope.  Shortness of breath.  CHEST - 2 VIEW  Comparison: Chest 03/23/2009.  Findings: There is interstitial pulmonary edema and small bilateral pleural effusions.  The patient is status post CABG.  Heart size normal.  IMPRESSION: Congestive heart failure.  Original Report Authenticated By: Bernadene Bell. Maricela Curet, M.D.    PHYSICAL EXAM General: Well developed, well nourished, in no acute distress Head: Eyes PERRLA, No xanthomas.   Normal cephalic and atramatic  Lungs: Markedly reduced breath sounds bilaterally. Mild expiratory wheezing Heart: HRRR, soft S1-S2  Pulses are 2+ & equal.            No carotid bruit. No JVD.  No abdominal bruits. No femoral bruits.  Abdomen: Bowel sounds are positive, abdomen soft and non-tender without masses or                  Hernia's noted. Msk:  Back normal, normal gait. Normal strength and tone for age. Extremities: No clubbing, cyanosis or edema.  DP +1 Neuro: Alert and oriented X 3. Psych:  Good affect, responds appropriately  TELEMETRY: Reviewed telemetry pt in normal sinus rhythm.  ASSESSMENT AND PLAN:  Principal Problem:  *NSTEMI (non-ST elevated myocardial infarction) Active Problems:  CAD (coronary artery disease)  CHF (congestive heart failure)  IDDM (insulin dependent diabetes mellitus)  Tobacco abuse  in addition to the above, [facility] has severe COPD with CO2 retention. I will decrease his O2 to keep his pulse ox around 92%. Careful input and output will be  ordered. We'll check electrolytes in the morning. Catheterization Monday  Valera Castle, MD 06/08/2011 1:12 PM

## 2011-06-08 NOTE — Progress Notes (Signed)
ANTICOAGULATION CONSULT NOTE - Follow Up Consult  Pharmacy Consult for Heparin Indication: NSTEMI  Labs:  Basename 06/08/11 1450 06/08/11 0907 06/08/11 0500 06/07/11 2327 06/07/11 1643 06/07/11 1614 06/07/11 1146  HGB -- -- 16.5 -- -- 18.4* --  HCT -- -- 52.6* -- -- 57.7* 57.4*  PLT -- -- 149* -- -- 140* 154  APTT -- -- -- -- -- -- --  LABPROT -- -- -- -- 13.2 -- --  INR -- -- -- -- 0.98 -- --  HEPARINUNFRC 0.52 0.67 -- <0.10* -- -- --  CREATININE -- -- 1.05 -- -- -- 0.92  CKTOTAL -- 225 -- 337* -- 366* --  CKMB -- 17.1* -- 22.5* -- 28.5* --  TROPONINI -- 1.65* -- 1.63* -- 1.08* --   Assessment: 65 yo male on heparin for NSTEMI. Confirmatory heparin level is therapeutic. No bleeding noted. Plan is for cath Mon.  Goal of Therapy:  Heparin level 0.3-0.7 units/ml   Plan:  1. Continue heparin 1200 units/hr (12 ml/hr) 2. Will f/u am heparin level.  Jeanice Lim, Maryanna Shape PharmD BCPS 06/08/2011,3:44 PM

## 2011-06-08 NOTE — Progress Notes (Signed)
ANTICOAGULATION CONSULT NOTE - Follow Up Consult  Pharmacy Consult for heparin Indication: NSTEMI  No Known Allergies  Patient Measurements: Height: 5\' 10"  (177.8 cm) Weight: 165 lb 2 oz (74.9 kg) IBW/kg (Calculated) : 73   Vital Signs: Temp: 98.3 F (36.8 C) (12/07 2302) Temp src: Oral (12/07 2302) BP: 105/62 mmHg (12/07 2302) Pulse Rate: 86  (12/07 2302)  Labs:  Basename 06/07/11 2327 06/07/11 1643 06/07/11 1614 06/07/11 1153 06/07/11 1146  HGB -- -- 18.4* -- 18.1*  HCT -- -- 57.7* -- 57.4*  PLT -- -- 140* -- 154  APTT -- -- -- -- --  LABPROT -- 13.2 -- -- --  INR -- 0.98 -- -- --  HEPARINUNFRC <0.10* -- -- -- --  CREATININE -- -- -- -- 0.92  CKTOTAL -- -- 366* 288* --  CKMB -- -- 28.5* 21.9* --  TROPONINI -- -- 1.08* 0.39* --   Estimated Creatinine Clearance: 82.7 ml/min (by C-G formula based on Cr of 0.92).   Medications:  Scheduled:    . albuterol  2.5 mg Nebulization Once  . albuterol  2.5 mg Nebulization Q4H  . aspirin  324 mg Oral Once  . aspirin  324 mg Oral Once  . aspirin  81 mg Oral Daily  . carvedilol  3.125 mg Oral BID WC  . furosemide  40 mg Intravenous Once  . furosemide  40 mg Intravenous BID  . heparin  2,000 Units Intravenous Once  . heparin  4,000 Units Intravenous Once  . insulin aspart  0-9 Units Subcutaneous TID WC  . lisinopril  5 mg Oral Daily  . nitroGLYCERIN  1 inch Topical Once  . rosuvastatin  20 mg Oral q1800  . sodium chloride  3 mL Intravenous Q12H   Infusions:    . heparin 900 Units/hr (06/07/11 1956)    Assessment: 65yo male undetectable on heparin with initial dosing for NSTEMI.  Goal of Therapy:  Heparin level 0.3-0.7 units/ml   Plan:  Will give a bolus of 2000 units and increase rate by 4 units/kg/hr to 1200 units/hr and check level in 6hr.  Colleen Can PharmD BCPS 06/08/2011,1:44 AM

## 2011-06-09 DIAGNOSIS — I519 Heart disease, unspecified: Secondary | ICD-10-CM

## 2011-06-09 LAB — CBC
HCT: 51.9 % (ref 39.0–52.0)
Hemoglobin: 16.3 g/dL (ref 13.0–17.0)
RDW: 15.1 % (ref 11.5–15.5)
WBC: 8.4 10*3/uL (ref 4.0–10.5)

## 2011-06-09 LAB — HEPARIN LEVEL (UNFRACTIONATED): Heparin Unfractionated: 0.56 IU/mL (ref 0.30–0.70)

## 2011-06-09 LAB — BLOOD GAS, ARTERIAL
Acid-Base Excess: 15 mmol/L — ABNORMAL HIGH (ref 0.0–2.0)
Bicarbonate: 40.7 mEq/L — ABNORMAL HIGH (ref 20.0–24.0)
TCO2: 42.8 mmol/L (ref 0–100)
pCO2 arterial: 68.4 mmHg (ref 35.0–45.0)

## 2011-06-09 LAB — BASIC METABOLIC PANEL
Chloride: 93 mEq/L — ABNORMAL LOW (ref 96–112)
Creatinine, Ser: 1.01 mg/dL (ref 0.50–1.35)
GFR calc Af Amer: 88 mL/min — ABNORMAL LOW (ref >90)
GFR calc non Af Amer: 76 mL/min — ABNORMAL LOW (ref >90)
Potassium: 4.7 mEq/L (ref 3.5–5.1)

## 2011-06-09 LAB — GLUCOSE, CAPILLARY
Glucose-Capillary: 415 mg/dL — ABNORMAL HIGH (ref 70–99)
Glucose-Capillary: 356 mg/dL — ABNORMAL HIGH (ref 70–99)
Glucose-Capillary: 363 mg/dL — ABNORMAL HIGH (ref 70–99)

## 2011-06-09 NOTE — Progress Notes (Signed)
Patient ID: [facility], male   DOB: 04-08-1946, 65 y.o.   MRN: [id number] SUBJECTIVE: No complaints of CP or SOB. Sleeping a lot. O2 sats in the mid 50's on room air. Diuresed -1520 cc yesterday. On 2 liters of O2 sats in the low 90's, quickly desats per [place] when goes to bathroom.  Filed Vitals:   06/08/11 1500 06/08/11 2016 06/08/11 2159 06/09/11 0417  BP: 100/70 92/52  99/57  Pulse: 70 78  95  Temp: 98 F (36.7 C) 98.3 F (36.8 C)  97.9 F (36.6 C)  TempSrc: Oral Oral  Oral  Resp: 18 19  19   Height:      Weight:    74.571 kg (164 lb 6.4 oz)  SpO2: 92% 92% 90% 91%    Intake/Output Summary (Last 24 hours) at 06/09/11 1151 Last data filed at 06/09/11 0418  Gross per 24 hour  Intake    480 ml  Output   2000 ml  Net  -1520 ml    LABS: Basic Metabolic Panel:  Basename 06/09/11 0610 06/08/11 0500 06/07/11 1146  NA 138 139 --  K 4.7 4.6 --  CL 93* 98 --  CO2 38* 33* --  GLUCOSE 249* 102* --  BUN 27* 25* --  CREATININE 1.01 1.05 --  CALCIUM 8.9 9.0 --  MG -- -- 2.0  PHOS -- -- --   Liver Function Tests:  Basename 06/07/11 1146  AST 53*  ALT 43  ALKPHOS 77  BILITOT 0.2*  PROT 7.1  ALBUMIN 3.1*   No results found for this basename: LIPASE:2,AMYLASE:2 in the last 72 hours CBC:  Basename 06/09/11 0610 06/08/11 0500  WBC 8.4 8.1  NEUTROABS -- --  HGB 16.3 16.5  HCT 51.9 52.6*  MCV 99.2 98.3  PLT 143* 149*   Cardiac Enzymes:  Basename 06/08/11 0907 06/07/11 2327 06/07/11 1614  CKTOTAL 225 337* 366*  CKMB 17.1* 22.5* 28.5*  CKMBINDEX -- -- --  TROPONINI 1.65* 1.63* 1.08*   BNP:  Basename 06/07/11 1453  POCBNP 1498.0*   D-Dimer: No results found for this basename: DDIMER:2 in the last 72 hours Hemoglobin A1C:  Basename 06/07/11 1614  HGBA1C 8.5*   Fasting Lipid Panel: No results found for this basename: CHOL,HDL,LDLCALC,TRIG,CHOLHDL,LDLDIRECT in the last 72 hours Thyroid Function Tests:  Basename 06/07/11 1614  TSH 1.661  T4TOTAL --    T3FREE --  THYROIDAB --   Anemia Panel: No results found for this basename: VITAMINB12,FOLATE,FERRITIN,TIBC,IRON,RETICCTPCT in the last 72 hours  RADIOLOGY: Dg Chest 2 View  06/07/2011  *RADIOLOGY REPORT*  Clinical Data: Near-syncope.  Shortness of breath.  CHEST - 2 VIEW  Comparison: Chest 03/23/2009.  Findings: There is interstitial pulmonary edema and small bilateral pleural effusions.  The patient is status post CABG.  Heart size normal.  IMPRESSION: Congestive heart failure.  Original Report Authenticated By: Bernadene Bell. Maricela Curet, M.D.    PHYSICAL EXAM General: Well developed, well nourished, in no acute distress, central and peripheral cyanosis on RA when I walked into the room Head: Eyes PERRLA, No xanthomas.   Normal cephalic and atramatic  Lungs: Markedly reduced BS and no rales. Heart: HRRR , soft S1 and S2  Pulses are 2+ & equal.            No carotid bruit. No JVD.  No abdominal bruits. No femoral bruits. Abdomen: Bowel sounds are positive, abdomen soft and non-tender without masses or  Hernia's noted. Msk:  Back normal, normal gait. Normal strength and tone for age. Extremities: No clubbing, cyanosis or edema.  DP +1 Neuro: Alert and oriented X 3. Psych:  Good affect, responds appropriately  TELEMETRY: Reviewed telemetry pt in NSR:  ASSESSMENT AND PLAN:  Principal Problem:  *NSTEMI (non-ST elevated myocardial infarction) Active Problems:  CAD (coronary artery disease)  CHF (congestive heart failure)  IDDM (insulin dependent diabetes mellitus)  Tobacco abuse  This man has severe COPD with RA sats in the 50's despite diuresis. Will check ABG on 2 liters with oximetry at 90-92%. Will diurese further today and plan cath tomorrow for ACS and NSTEMI. He is a candidate for PCI or medical therapy only. Indications, risks, benefit discussed...agrees to proceed  Valera Castle, MD 06/09/2011 11:51 AM

## 2011-06-09 NOTE — Progress Notes (Signed)
  Echocardiogram 2D Echocardiogram has been performed.  [facility] Nira Retort 06/09/2011, 9:36 AM

## 2011-06-09 NOTE — Progress Notes (Signed)
ANTICOAGULATION CONSULT NOTE - Follow Up Consult  Pharmacy Consult for heparin Indication: chest pain/ACS  No Known Allergies   Vital Signs: Temp: 97.9 F (36.6 C) (12/09 0417) Temp src: Oral (12/09 0417) BP: 99/57 mmHg (12/09 0417) Pulse Rate: 95  (12/09 0417)  Labs:  Basename 06/09/11 0610 06/08/11 1450 06/08/11 0907 06/08/11 0500 06/07/11 2327 06/07/11 1643 06/07/11 1614 06/07/11 1146  HGB 16.3 -- -- 16.5 -- -- -- --  HCT 51.9 -- -- 52.6* -- -- 57.7* --  PLT 143* -- -- 149* -- -- 140* --  APTT -- -- -- -- -- -- -- --  LABPROT -- -- -- -- -- 13.2 -- --  INR -- -- -- -- -- 0.98 -- --  HEPARINUNFRC 0.56 0.52 0.67 -- -- -- -- --  CREATININE 1.01 -- -- 1.05 -- -- -- 0.92  CKTOTAL -- -- 225 -- 337* -- 366* --  CKMB -- -- 17.1* -- 22.5* -- 28.5* --  TROPONINI -- -- 1.65* -- 1.63* -- 1.08* --   Estimated Creatinine Clearance: 75.3 ml/min (by C-G formula based on Cr of 1.01).   Assessment: On IV heparin for CP. Heparin has been therapeutic. No complication with anticoag noted.   Goal of Therapy:  Heparin level 0.3-0.7 units/ml   Plan:  1. Cont heparin drip at 1200 units/hr 2. F/u with AM heparin level  [facility] 06/09/2011,12:34 PM

## 2011-06-10 ENCOUNTER — Encounter (HOSPITAL_COMMUNITY)
Admission: EM | Disposition: A | Discharge status: Home Health | Payer: Self-pay | Source: Home / Self Care | Attending: Cardiovascular Disease

## 2011-06-10 DIAGNOSIS — I25810 Atherosclerosis of coronary artery bypass graft(s) without angina pectoris: Secondary | ICD-10-CM

## 2011-06-10 DIAGNOSIS — I739 Peripheral vascular disease, unspecified: Secondary | ICD-10-CM

## 2011-06-10 DIAGNOSIS — I2510 Atherosclerotic heart disease of native coronary artery without angina pectoris: Secondary | ICD-10-CM

## 2011-06-10 DIAGNOSIS — J438 Other emphysema: Secondary | ICD-10-CM

## 2011-06-10 DIAGNOSIS — J9610 Chronic respiratory failure, unspecified whether with hypoxia or hypercapnia: Secondary | ICD-10-CM

## 2011-06-10 DIAGNOSIS — I509 Heart failure, unspecified: Secondary | ICD-10-CM

## 2011-06-10 HISTORY — PX: GRAFT(S) ANGIOGRAM: SHX5479

## 2011-06-10 HISTORY — PX: LEFT HEART CATHETERIZATION WITH CORONARY ANGIOGRAM: SHX5451

## 2011-06-10 LAB — POCT I-STAT 3, ART BLOOD GAS (G3+)
O2 Saturation: 71 %
TCO2: 45 mmol/L (ref 0–100)
pCO2 arterial: 72 mmHg (ref 35.0–45.0)
pH, Arterial: 7.382 (ref 7.350–7.450)
pO2, Arterial: 40 mmHg — ABNORMAL LOW (ref 80.0–100.0)

## 2011-06-10 LAB — BASIC METABOLIC PANEL
CO2: 39 mEq/L — ABNORMAL HIGH (ref 19–32)
Chloride: 91 mEq/L — ABNORMAL LOW (ref 96–112)
GFR calc Af Amer: 89 mL/min — ABNORMAL LOW (ref >90)
Potassium: 4.8 mEq/L (ref 3.5–5.1)
Sodium: 136 mEq/L (ref 135–145)

## 2011-06-10 LAB — GLUCOSE, CAPILLARY
Glucose-Capillary: 414 mg/dL — ABNORMAL HIGH (ref 70–99)
Glucose-Capillary: 283 mg/dL — ABNORMAL HIGH (ref 70–99)

## 2011-06-10 LAB — CBC
Hemoglobin: 17.2 g/dL — ABNORMAL HIGH (ref 13.0–17.0)
MCH: 30.7 pg (ref 26.0–34.0)
Platelets: 135 10*3/uL — ABNORMAL LOW (ref 150–400)
RBC: 5.61 MIL/uL (ref 4.22–5.81)
WBC: 7.1 10*3/uL (ref 4.0–10.5)

## 2011-06-10 LAB — HEPARIN LEVEL (UNFRACTIONATED): Heparin Unfractionated: 0.39 IU/mL (ref 0.30–0.70)

## 2011-06-10 SURGERY — LEFT HEART CATHETERIZATION WITH CORONARY ANGIOGRAM
Anesthesia: LOCAL

## 2011-06-10 MED ORDER — HEPARIN (PORCINE) IN NACL 2-0.9 UNIT/ML-% IJ SOLN
INTRAMUSCULAR | Status: AC
  Filled 2011-06-10: qty 2000

## 2011-06-10 MED ORDER — HEPARIN SODIUM (PORCINE) 1000 UNIT/ML IJ SOLN
INTRAMUSCULAR | Status: AC
  Filled 2011-06-10: qty 1

## 2011-06-10 MED ORDER — DIAZEPAM 5 MG PO TABS
5.0000 mg | ORAL_TABLET | ORAL | Status: AC
  Administered 2011-06-10: 5 mg via ORAL
  Filled 2011-06-10 (×2): qty 1

## 2011-06-10 MED ORDER — FLUTICASONE-SALMETEROL 500-50 MCG/DOSE IN AEPB
1.0000 | INHALATION_SPRAY | Freq: Two times a day (BID) | RESPIRATORY_TRACT | Status: DC
  Administered 2011-06-11 – 2011-06-14 (×7): 1 via RESPIRATORY_TRACT
  Filled 2011-06-10: qty 14

## 2011-06-10 MED ORDER — LIDOCAINE HCL (PF) 1 % IJ SOLN
INTRAMUSCULAR | Status: AC
  Filled 2011-06-10: qty 30

## 2011-06-10 MED ORDER — NITROGLYCERIN 0.2 MG/ML ON CALL CATH LAB
INTRAVENOUS | Status: AC
  Filled 2011-06-10: qty 1

## 2011-06-10 MED ORDER — SODIUM CHLORIDE 0.9 % IV SOLN: 1.0000 mL/kg/h | INTRAVENOUS | Status: DC

## 2011-06-10 MED ORDER — SODIUM CHLORIDE 0.9 % IV SOLN
1.0000 mL/kg/h | INTRAVENOUS | Status: DC
  Administered 2011-06-10: 1 mL/kg/h via INTRAVENOUS

## 2011-06-10 MED ORDER — ASPIRIN 81 MG PO CHEW
324.0000 mg | CHEWABLE_TABLET | ORAL | Status: AC
  Administered 2011-06-10: 324 mg via ORAL
  Filled 2011-06-10: qty 4

## 2011-06-10 MED ORDER — TIOTROPIUM BROMIDE MONOHYDRATE 18 MCG IN CAPS
18.0000 ug | ORAL_CAPSULE | Freq: Every day | RESPIRATORY_TRACT | Status: DC
  Administered 2011-06-11 – 2011-06-14 (×4): 18 ug via RESPIRATORY_TRACT
  Filled 2011-06-10: qty 5

## 2011-06-10 MED ORDER — LISINOPRIL 5 MG PO TABS
5.0000 mg | ORAL_TABLET | Freq: Every day | ORAL | Status: DC
  Administered 2011-06-10 – 2011-06-14 (×3): 5 mg via ORAL
  Filled 2011-06-10 (×5): qty 1

## 2011-06-10 MED ORDER — HEPARIN SOD (PORCINE) IN D5W 100 UNIT/ML IV SOLN
1200.0000 [IU]/h | INTRAVENOUS | Status: DC
  Administered 2011-06-11: 1200 [IU]/h via INTRAVENOUS
  Filled 2011-06-10 (×2): qty 250

## 2011-06-10 MED ORDER — CLOPIDOGREL BISULFATE 75 MG PO TABS
300.0000 mg | ORAL_TABLET | Freq: Once | ORAL | Status: AC
  Administered 2011-06-10: 300 mg via ORAL
  Filled 2011-06-10: qty 4

## 2011-06-10 NOTE — Clinical Documentation Improvement (Signed)
CHF DOCUMENTATION CLARIFICATION QUERY  THIS DOCUMENT IS NOT A PERMANENT PART OF THE MEDICAL RECORD  TO RESPOND TO THE THIS QUERY, FOLLOW THE INSTRUCTIONS BELOW:  1. If needed, update documentation for the patient's encounter via the notes activity.  2. Access this query again and click edit on the [employer].  3. After updating, or not, click F2 to complete all highlighted (required) fields concerning your review. Select "additional documentation in the medical record" OR "no additional documentation provided".  4. Click Sign note button.  5. The deficiency will fall out of your InBasket *Please let us know if you are not able to compete this workflow by phone or e-mail (listed below).  Please update your documentation within the medical record to reflect your response to this query.                                                                                    06/10/11  Dear [facility],  In a better effort to capture your patient's severity of illness, reflect appropriate length of stay and utilization of resources, a review of the patient medical record has revealed the following indicators the diagnosis of Heart Failure.     Possible Clinical Conditions?  Acute Systolic Congestive Heart Failure Acute Diastolic Congestive Heart Failure Acute Systolic & Diastolic Congestive Heart Failure Acute on Chronic Systolic Congestive Heart Failure Acute on Chronic Diastolic Congestive Heart Failure Acute on Chronic Systolic & Diastolic  Congestive Heart Failure Other Condition________________________________________ Cannot Clinically Determine   Risk Factors:    Acute CHF noted per progress note of 06/10/11. Please specify Systolic vs Diastolic in the progress notes.  Reviewed: additional documentation in the medical record  Thank You,  Heywood Footman, BSN,  Clinical Documentation Specialist:  Pager: [phone]  Health Information Management [facility]

## 2011-06-10 NOTE — H&P (View-Only) (Signed)
Cardiology Progress Note Patient Name: James Moon Date of Encounter: 06/10/2011, 9:05 AM     Subjective  No overnight events. Pt feeling well this morning. Denies chest pain or shortness of breath.   Objective   Telemetry: Sinus rhythm 70s-90s with occasional PVCs.  Medications: . albuterol  2.5 mg Nebulization TID  . aspirin  81 mg Oral Daily  . carvedilol  3.125 mg Oral BID WC  . furosemide  40 mg Intravenous BID  . insulin aspart  0-9 Units Subcutaneous TID WC  . rosuvastatin  20 mg Oral q1800  . sodium chloride  3 mL Intravenous Q12H    Physical Exam: Temp:  [98.2 F (36.8 C)] 98.2 F (36.8 C) (12/10 0459) Pulse Rate:  [74-88] 88  (12/10 0856) Resp:  [18-19] 18  (12/10 0459) BP: (94-100)/(56-63) 100/63 mmHg (12/10 0856) SpO2:  [88 %-95 %] 88 % (12/10 0842) Weight:  [71.578 kg (157 lb 12.8 oz)] 157 lb 12.8 oz (71.578 kg) (12/10 0459)  General: Elderly white male in no acute distress. Head: Normocephalic, atraumatic, sclera non-icteric, nares are without discharge.  Neck: Supple. Negative for carotid bruits or JVD Lungs: Diminished throughout with fine bibasilar rales. No wheezes or rhonchi. Breathing is unlabored. Heart: RRR S1 S2 without murmurs, rubs, or gallops.  Abdomen: Soft, non-tender, non-distended with normoactive bowel sounds. No rebound/guarding. No obvious abdominal masses. Msk:  Strength and tone appear normal for age. Extremities: Trace bilat pedal edema. No clubbing or cyanosis. Distal pedal pulses are 2+ and equal bilaterally. Neuro: Alert and oriented X 3. Moves all extremities spontaneously. Psych:  Responds to questions appropriately with a normal affect.  Intake/Output Summary (Last 24 hours) at 06/10/11 0905 Last data filed at 06/10/11 0500  Gross per 24 hour  Intake      0 ml  Output   1000 ml  Net  -1000 ml   Labs:  [facility] 06/10/11 0710 06/09/11 0610 06/07/11 1146  NA 136 138 --  K 4.8 4.7 --  CL 91* 93* --  CO2 39* 38*  --  GLUCOSE 376* 249* --  BUN 27* 27* --  CREATININE 1.00 1.01 --  CALCIUM 9.3 8.9 --  MG -- -- 2.0  PHOS -- -- --   Basename 06/07/11 1146  AST 53*  ALT 43  ALKPHOS 77  BILITOT 0.2*  PROT 7.1  ALBUMIN 3.1*   Basename 06/10/11 0710 06/09/11 0610  WBC 7.1 8.4  HGB 17.2* 16.3  HCT 55.2* 51.9  MCV 98.4 99.2  PLT 135* 143*   Basename 06/08/11 0907 06/07/11 2327 06/07/11 1614 06/07/11 1153  CKTOTAL 225 337* 366* 288*  CKMB 17.1* 22.5* 28.5* 21.9*  TROPONINI 1.65* 1.63* 1.08* 0.39*   Basename 06/07/11 1453  POCBNP 1498.0*   Basename 06/07/11 1614  HGBA1C 8.5*   Basename 06/07/11 1614  TSH 1.661     06/09/2011 13:20  pH, Arterial 7.392  pCO2 arterial 68.4 (HH)  pO2, Arterial 67.8 (L)  Bicarbonate 40.7 (H)  TCO2 42.8  Acid-Base Excess 15.0 (H)  O2 Saturation 92.2   Radiology/Studies:   06/09/11 - 2D Echocardiogram - Left ventricle: The cavity size was mildly dilated. Wall thickness was normal. Systolic function was severely reduced. The estimated ejection fraction was in the range of 20% to 25%. There was an increased relative contribution of atrial contraction to ventricular filling. The deceleration time of the early transmitral flow velocity was decreased. Isovolumic relaxation time was decreased. Doppler parameters are consistent with  both elevated ventricular end-diastolic filling pressure and elevated left atrial filling pressure. E/E' medial TDI approximately 30 - Regional wall motion abnormality: Akinesis and scarring of the mid anteroseptal, mid inferoseptal, apical inferior, apical septal, and apical myocardium; severe hypokinesis of the apical anterior, basal-mid inferior, mid anterolateral, and apical lateral myocardium; hypokinesis of the basal inferoseptal and mid inferolateral myocardium; moderate hypokinesis of the mid anterior myocardium. - Mitral valve: Calcified annulus. Mildly thickened leaflets  - Right ventricle: Unable to evaluate RVSP Systolic  function was mildly reduced. - Atrial septum: No defect or patent foramen ovale was identified.  Dg Chest 2 View 06/07/2011   Findings: There is interstitial pulmonary edema and small bilateral pleural effusions.  The patient is status post CABG.  Heart size normal.  IMPRESSION: Congestive heart failure.     Assessment and Plan  65 yo with PMHx significant for CAD (CABG x 4 in 1996 utilizing SVGs harvested from R leg), IDDM, HTN, tobacco abuse (50 pack-year hx) and colon cancer (s/p colectomy) presenting to [facility] ED via EMS with worsening SOB, dizziness and weakness. He had pulmonary edema on CXR and an elevated BNP and ruled in for an NSTEMI.   1. NSTEMI/CAD:  Has not developed any chest pain. Will go for cath today.  - Cont ASA, BB, add ACE inhibitor and statin  2. Acute CHF: Has fine bibasilar rales with trace lower extremity edema. His breathing has improved and he is diuresing well. Echo showed EF 20-25% - Cont Lasix and add back  ACE inhibitor - Strict I/Os, daily weights  3. Respiratory Acidosis - Improved. pH 7.392, pCO2 68.4 - Doing well on 2L [place], however, desats to 50s without O2  4. IDDM - A1C 8.5 - Cont SSI per protocol  5. Tobacco abuse - Tobacco cessation counseling   Signed, [name], [name] PA-C  James Moon is an 65 y.o. male.     Attending Note:   The patient was seen and examined.  Agree with assessment and plan as noted above.  Pt is ready for cath.  He has baseline hypoxia but is feeling about normal.  He has stopped smoking.  ACE inhibitor had been stopped for ? Reason - we have added it back.  Hx of DM and CHF. Renal function.  Vesta Mixer, Montez Hageman., MD, [facility] 06/10/2011, 9:32 AM

## 2011-06-10 NOTE — Procedures (Signed)
  Cardiac Catheterization Procedure Note  Name: James Moon MRN: [id number] DOB: 06/08/1946  Procedure: Left Heart Cath, Selective Coronary Angiography, LV angiography  Indication:     Procedural details: The right groin was prepped, draped, and anesthetized with 1% lidocaine. Using modified Seldinger technique, a 5 French sheath was introduced into the right femoral artery. Standard Judkins catheters were used for coronary angiography and left ventriculography. Catheter exchanges were performed over a guidewire. There was some difficulty advancing the guide wire.  Therefore, I used a Investment banker, corporate and a long exchange wire.  I did a distal aortogram as well.There were no immediate procedural complications. The patient was transferred to the post catheterization recovery area for further monitoring.  Procedural Findings:   Hemodynamics:     AO 94/8     LV 95/51   Coronary angiography:   Coronary dominance: Right  Left mainstem:   Mid 25% stenosis.   Left anterior descending (LAD):   Heavy proximal and mid calcification. Long proximal 70% stenosis. The vessel is large wrapping the apex and is free of high-grade disease.  Left circumflex (LCx):  Moderate size mid obtuse marginal branching free of high-grade disease after an ostial occlusion. Posterior lateral is small to moderate sized and also occluded at the ostium. Both vessels perfuse via vein graft.  Right coronary artery (RCA):  Occluded proximally.  LIMA:  Widely patent.  SVG RCA: Proximal 99%. This is a focal lesion. The remainder of the graft is free of high-grade disease.   SVG OM1/OM2:  Proximal 25% stenosis. Mid 80-90% stenosis. There is a 25% stenosis after the anastomosis of the first obtuse marginal but before the second anastomosis. Mid 80-90% stenosis.  Left ventriculography: Left ventricular systolic function is normal, LVEF is estimated at 25%, there is no significant mitral regurgitation.  Severe apical  akinesis including the anterior apex and inferior apex  Distal aortogram:  Diffuse nonobstructive plaque extending into bilateral femorals.  Final Conclusions:  Native three-vessel coronary artery disease. Saphenous vein graft stenosis as described. Ischemic artery myopathy.  Recommendations: The patient will have percutaneous revascularization of both grafts. He needs to be considered for an ICD. This will have to be considered in the context of severe lung disease. He needs aggressive risk reduction and we talked about the need to stop smoking.  Rollene Rotunda 06/10/2011, 3:16 PM

## 2011-06-10 NOTE — Interval H&P Note (Signed)
History and Physical Interval Note:  06/10/2011 2:37 PM  James Moon  has presented today for surgery, with the diagnosis of chest pain  The various methods of treatment have been discussed with the patient and family. After consideration of risks, benefits and other options for treatment, the patient has consented to  Procedure(s): LEFT HEART CATHETERIZATION WITH CORONARY ANGIOGRAM GRAFT(S) ANGIOGRAM as a surgical intervention .  The patients' history has been reviewed, patient examined, no change in status, stable for surgery.  I have reviewed the patients' chart and labs.  Questions were answered to the patient's satisfaction.    Rollene Rotunda 06/10/2011 2:37 PM

## 2011-06-10 NOTE — Progress Notes (Signed)
Inpatient Diabetes Program Recommendations  AACE/ADA: New Consensus Statement on Inpatient Glycemic Control (2009)  Target Ranges:  Prepandial:   less than 140 mg/dL      Peak postprandial:   less than 180 mg/dL (1-2 hours)      Critically ill patients:  140 - 180 mg/dL   Reason for Visit: According to medication reconciliation, patient was on 70/30 15-25 units bid.  A1C=8.5% indicating sub-optimal glycemic control. CBG's today are 414 and 334 mg/dL.  Inpatient Diabetes Program Recommendations Insulin - Basal: Add Lantus 28 units daily (this is 80% of home insulin basal regimen). Insulin - Meal Coverage: Once eating, consider adding Novolog 4 units tid with meals (hold if patient eats less than 50%)  Note:

## 2011-06-10 NOTE — Progress Notes (Signed)
ANTICOAGULATION CONSULT NOTE - Follow Up Consult  Pharmacy Consult for Heparin Indication: ACS  No Known Allergies   Vital Signs: Temp: 98.2 F (36.8 C) (12/10 0459) Temp src: Oral (12/10 0459) BP: 100/63 mmHg (12/10 0856) Pulse Rate: 88  (12/10 0856)  Labs:  Basename 06/10/11 0710 06/09/11 0610 06/08/11 1450 06/08/11 0907 06/08/11 0500 06/07/11 2327 06/07/11 1643 06/07/11 1614  HGB 17.2* 16.3 -- -- -- -- -- --  HCT 55.2* 51.9 -- -- 52.6* -- -- --  PLT 135* 143* -- -- 149* -- -- --  APTT -- -- -- -- -- -- -- --  LABPROT -- -- -- -- -- -- 13.2 --  INR -- -- -- -- -- -- 0.98 --  HEPARINUNFRC 0.39 0.56 0.52 -- -- -- -- --  CREATININE 1.00 1.01 -- -- 1.05 -- -- --  CKTOTAL -- -- -- 225 -- 337* -- 366*  CKMB -- -- -- 17.1* -- 22.5* -- 28.5*  TROPONINI -- -- -- 1.65* -- 1.63* -- 1.08*   Estimated Creatinine Clearance: 74.6 ml/min (by C-G formula based on Cr of 1).  Assessment: 65yo male with NSTEMI, for cath today.  Heparin level is therapeutic on current rate and cbc is stable.  No bleeding problems noted.  Goal of Therapy:  Heparin level 0.3-0.7 units/ml   Plan:  Will follow-up after cath.  Ellasyn Swilling P 06/10/2011,10:16 AM

## 2011-06-10 NOTE — Consult Note (Signed)
Name: James Moon MRN: [id number] DOB: 1946-05-31    LOS: 3  PCCM CONSULTATION NOTE  Requesting physician:  Shawnie Pons, MD  Reason for consult:  Suspected COPD  History of Present Illness: 65 yo WM active smoker with CAD s/p CABG x 4 in 1996 who presented to [facility] ED on 12/07 with worsening SOB, dizziness and weakness. Cardiac catheterization on 12/10 demonstrated   Estimated EF of 25% and both grafts requiring revascularization.  Pulmonology consultation was requested given hypoxia, hypercarbia and suspected chronic obstructive pulmonary disease.  Today Mr. Egnor reports chronic productive cough, dyspnea (sometimes at rest and usually with mild to moderate exertion).  There was no recent fever or hemoptysis.  There is no chest pain at this time.   Past Medical History  Diagnosis Date  . Diabetes mellitus   . Cancer   . Hypertension   . CAD (coronary artery disease)     CABG in 1996   Past Surgical History  Procedure Date  . Colon surgery   . Open heart surgery    Prior to Admission medications   Medication Sig Start Date End Date Taking? Authorizing Provider  aspirin EC 81 MG tablet Take 81 mg by mouth daily.     Yes Historical Provider, MD  insulin NPH-insulin regular (NOVOLIN 70/30) (70-30) 100 UNIT/ML injection Inject 15-25 Units into the skin 2 (two) times daily with a meal. 15 units in the morning and 25 units in the evening.    Yes Historical Provider, MD   Allergies No Known Allergies  Family History Family History  Problem Relation Age of Onset  . Heart attack Father   . Breast cancer Mother   . Diabetes Father     Social History  reports that he has been smoking.  He does not have any smokeless tobacco history on file. He reports that he does not drink alcohol or use illicit drugs.  Review Of Systems  11 points review of systems is negative with an exception of listed in HPI.  Vital Signs: Temp:  [98.1 F (36.7 C)-98.3 F (36.8 C)] 98.3 F (36.8  C) (12/10 2018) Pulse Rate:  [74-89] 89  (12/10 2018) Resp:  [13-19] 13  (12/10 2018) BP: (94-103)/(56-63) 103/57 mmHg (12/10 2018) SpO2:  [88 %-95 %] 91 % (12/10 2018) Weight:  [71.578 kg (157 lb 12.8 oz)] 157 lb 12.8 oz (71.578 kg) (12/10 0459) I/O last 3 completed shifts: In: 360 [P.O.:360] Out: 2000 [Urine:2000]  Physical Examination: General:  No acute distress Neuro:  Awake, alert and cooperative   HEENT:  PERRL Neck:  No JVD or cervical adenopathy Cardiovascular:  S1/S2, no M/R/G Lungs:  Very diminished bilateral air entry, few bibasilar rales, prolonged expiratory phase Abdomen:  Soft, non tender, non distended Musculoskeletal:  Trace edema LE Skin:  Intact  Labs and Imaging:   CBC:  WBC 7.1, Hb 17.2 ABG:  pH 7.38, PaCO2 72, Pa02 40, HCO3 43 CXR:  Interstitial edema, small bilateral effusions  Assessment and Plan:  Strongly 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  Given need for supplemental oxygen would classify as severe.  Given hypercarbia suspect FEV1 under 1 L.  No evidence of acute exacerbation.  Dyspnea is likely multifactorial form COPD and pulmonary edema related to ischemic cardiomyopathy.  -->encouraged tobacco avoidance -->will continue bronchodilators and start inhaled steroids and Spiriva -->there is no indications for systemic steroids or antibiotics at this time -->will need home oxygen, goal SpO2 >  88% -->needs full PFT and Pulmonary follow up after discharge   Orlean Bradford, M.D. Pulmonary and Critical Care Medicine [facility] Cell: [phone] Pager: [phone]  06/10/2011, 8:55 PM

## 2011-06-10 NOTE — Progress Notes (Addendum)
Cardiology Progress Note Patient Name: James Moon Date of Encounter: 06/10/2011, 9:05 AM     Subjective  No overnight events. Pt feeling well this morning. Denies chest pain or shortness of breath.   Objective   Telemetry: Sinus rhythm 70s-90s with occasional PVCs.  Medications: . albuterol  2.5 mg Nebulization TID  . aspirin  81 mg Oral Daily  . carvedilol  3.125 mg Oral BID WC  . furosemide  40 mg Intravenous BID  . insulin aspart  0-9 Units Subcutaneous TID WC  . rosuvastatin  20 mg Oral q1800  . sodium chloride  3 mL Intravenous Q12H    Physical Exam: Temp:  [98.2 F (36.8 C)] 98.2 F (36.8 C) (12/10 0459) Pulse Rate:  [74-88] 88  (12/10 0856) Resp:  [18-19] 18  (12/10 0459) BP: (94-100)/(56-63) 100/63 mmHg (12/10 0856) SpO2:  [88 %-95 %] 88 % (12/10 0842) Weight:  [71.578 kg (157 lb 12.8 oz)] 157 lb 12.8 oz (71.578 kg) (12/10 0459)  General: Elderly white male in no acute distress. Head: Normocephalic, atraumatic, sclera non-icteric, nares are without discharge.  Neck: Supple. Negative for carotid bruits or JVD Lungs: Diminished throughout with fine bibasilar rales. No wheezes or rhonchi. Breathing is unlabored. Heart: RRR S1 S2 without murmurs, rubs, or gallops.  Abdomen: Soft, non-tender, non-distended with normoactive bowel sounds. No rebound/guarding. No obvious abdominal masses. Msk:  Strength and tone appear normal for age. Extremities: Trace bilat pedal edema. No clubbing or cyanosis. Distal pedal pulses are 2+ and equal bilaterally. Neuro: Alert and oriented X 3. Moves all extremities spontaneously. Psych:  Responds to questions appropriately with a normal affect.  Intake/Output Summary (Last 24 hours) at 06/10/11 0905 Last data filed at 06/10/11 0500  Gross per 24 hour  Intake      0 ml  Output   1000 ml  Net  -1000 ml   Labs:  [facility] 06/10/11 0710 06/09/11 0610 06/07/11 1146  NA 136 138 --  K 4.8 4.7 --  CL 91* 93* --  CO2 39* 38*  --  GLUCOSE 376* 249* --  BUN 27* 27* --  CREATININE 1.00 1.01 --  CALCIUM 9.3 8.9 --  MG -- -- 2.0  PHOS -- -- --   Basename 06/07/11 1146  AST 53*  ALT 43  ALKPHOS 77  BILITOT 0.2*  PROT 7.1  ALBUMIN 3.1*   Basename 06/10/11 0710 06/09/11 0610  WBC 7.1 8.4  HGB 17.2* 16.3  HCT 55.2* 51.9  MCV 98.4 99.2  PLT 135* 143*   Basename 06/08/11 0907 06/07/11 2327 06/07/11 1614 06/07/11 1153  CKTOTAL 225 337* 366* 288*  CKMB 17.1* 22.5* 28.5* 21.9*  TROPONINI 1.65* 1.63* 1.08* 0.39*   Basename 06/07/11 1453  POCBNP 1498.0*   Basename 06/07/11 1614  HGBA1C 8.5*   Basename 06/07/11 1614  TSH 1.661     06/09/2011 13:20  pH, Arterial 7.392  pCO2 arterial 68.4 (HH)  pO2, Arterial 67.8 (L)  Bicarbonate 40.7 (H)  TCO2 42.8  Acid-Base Excess 15.0 (H)  O2 Saturation 92.2   Radiology/Studies:   06/09/11 - 2D Echocardiogram - Left ventricle: The cavity size was mildly dilated. Wall thickness was normal. Systolic function was severely reduced. The estimated ejection fraction was in the range of 20% to 25%. There was an increased relative contribution of atrial contraction to ventricular filling. The deceleration time of the early transmitral flow velocity was decreased. Isovolumic relaxation time was decreased. Doppler parameters are consistent with  both elevated ventricular end-diastolic filling pressure and elevated left atrial filling pressure. E/E' medial TDI approximately 30 - Regional wall motion abnormality: Akinesis and scarring of the mid anteroseptal, mid inferoseptal, apical inferior, apical septal, and apical myocardium; severe hypokinesis of the apical anterior, basal-mid inferior, mid anterolateral, and apical lateral myocardium; hypokinesis of the basal inferoseptal and mid inferolateral myocardium; moderate hypokinesis of the mid anterior myocardium. - Mitral valve: Calcified annulus. Mildly thickened leaflets  - Right ventricle: Unable to evaluate RVSP Systolic  function was mildly reduced. - Atrial septum: No defect or patent foramen ovale was identified.  Dg Chest 2 View 06/07/2011   Findings: There is interstitial pulmonary edema and small bilateral pleural effusions.  The patient is status post CABG.  Heart size normal.  IMPRESSION: Congestive heart failure.     Assessment and Plan  65 yo with PMHx significant for CAD (CABG x 4 in 1996 utilizing SVGs harvested from R leg), IDDM, HTN, tobacco abuse (50 pack-year hx) and colon cancer (s/p colectomy) presenting to [facility] ED via EMS with worsening SOB, dizziness and weakness. He had pulmonary edema on CXR and an elevated BNP and ruled in for an NSTEMI.   1. NSTEMI/CAD:  Has not developed any chest pain. Will go for cath today.  - Cont ASA, BB, add ACE inhibitor and statin  2. Acute CHF: Has fine bibasilar rales with trace lower extremity edema. His breathing has improved and he is diuresing well. Echo showed EF 20-25% - Cont Lasix and add back  ACE inhibitor - Strict I/Os, daily weights  3. Respiratory Acidosis - Improved. pH 7.392, pCO2 68.4 - Doing well on 2L [place], however, desats to 50s without O2  4. IDDM - A1C 8.5 - Cont SSI per protocol  5. Tobacco abuse - Tobacco cessation counseling   Signed, Moon, [name] PA-C  ERHARDT James Moon is an 65 y.o. male.     Attending Note:   The patient was seen and examined.  Agree with assessment and plan as noted above.  Pt is ready for cath.  He has baseline hypoxia but is feeling about normal.  He has stopped smoking.  ACE inhibitor had been stopped for ? Reason - we have added it back.  Hx of DM and CHF. Renal function.  Vesta Mixer, Montez Hageman., MD, [facility] 06/10/2011, 9:32 AM   Addendum:  The patient has acute on chronic systolic heart failure.  Vesta Mixer, Montez Hageman., MD, [facility] 06/10/2011, 3:24 PM

## 2011-06-10 NOTE — H&P (Signed)
Cardiology History and Physical     The note written 06/07/11 and listed as a consult note is actually the History and Physcial.  It is copied below.   Patient ID: James Moon MRN: [id number], DOB/AGE: 65/07/1945   Admit date: 06/07/2011 Date of Consult: 06/10/2011  Primary Physician: PA at [facility] Primary Cardiologist: None  Pt. Profile: James Moon is a 65 yo with PMHx significant for CAD (CABG x 4 in 1996 utilizing SVGs harvested from R leg), IDDM, HTN, tobacco abuse (50 pack-year hx) and colon cancer (s/p colectomy) presenting to [facility] ED via EMS with worsening SOB, dizziness and weakness.    Problem List: Past Medical History  Diagnosis Date  . Diabetes mellitus   . Cancer   . Hypertension   . CAD (coronary artery disease)     CABG in 1996    Past Surgical History  Procedure Date  . Colon surgery   . Open heart surgery      Allergies: No Known Allergies  HPI:   He reports a 13-month history of worsening SOB accompanied by lightheadedness, weakness and occasional palpitations experienced after very mild exertion (bending over to tie shoes, walking from one end of the house to the other). He reports worsening ankle swelling, orthopnea, PND and nocturia. He denies chest pain, n/v, abdominal pain, cough, fevers, chills, changes in BM, blood in urine, bleeding, recent numbness, incoordination, imbalance, slurred speech, falling, extended travel and sick contacts.   He states that he has not followed a cardiologist in years and denies any cardiac work-up since open heart surgery. He used to follow a PA at [facility], but has not seen her in "a couple years" due to recent unemployment and lack of health insurance.  Today, he awoke, went to the bathroom and became lightheaded upon bending over. He became weak, short of breath and stumbled to his knees, but did not lose consciousness. 911 was called and he was transported to [facility] ED.  In the  ED, EKG revealed questionable lateral ischemia, CK-MB and TnI positive at 21.9 and 0.39, respectively, CXR revealed interstitial pulmonary edema and small bilateral pleural effusions, no cardiomegaly; of note, potassium, H/H mildly elevated; blood gases: pH 7.21, pCO2 89.3.  Inpatient Medications:     . albuterol  2.5 mg Nebulization TID  . aspirin  324 mg Oral Once  . aspirin  81 mg Oral Daily  . carvedilol  3.125 mg Oral BID WC  . furosemide  40 mg Intravenous BID  . insulin aspart  0-9 Units Subcutaneous TID WC  . rosuvastatin  20 mg Oral q1800  . sodium chloride  3 mL Intravenous Q12H  . DISCONTD: lisinopril  5 mg Oral Daily  Outpatient Medications: ASA 81mg  Insulin Prescriptions prior to admission  Medication Sig Dispense Refill  . aspirin EC 81 MG tablet Take 81 mg by mouth daily.        . insulin NPH-insulin regular (NOVOLIN 70/30) (70-30) 100 UNIT/ML injection Inject 15-25 Units into the skin 2 (two) times daily with a meal. 15 units in the morning and 25 units in the evening.         Family History  Problem Relation Age of Onset  . Heart attack Father   . Breast cancer Mother   . Diabetes Father      History   Social History  . Marital Status: Widowed    Spouse Name: N/A    Number of Children: N/A  . Years  of Education: N/A   Occupational History  . Not on file.   Social History Main Topics  . Smoking status: Current Everyday Smoker -- 1.0 packs/day for 50 years  . Smokeless tobacco: Not on file  . Alcohol Use: No  . Drug Use: No  . Sexually Active: Not on file   Other Topics Concern  . Not on file   Social History Narrative   Lives in [place], has 1 child.  He is widowed .  He works at [employer].     Review of Systems: General: negative for chills, fever, night sweats or weight changes. Cardiovascular: negative for chest pain, positive for dyspnea on exertion, edema, orthopnea, palpitations, paroxysmal nocturnal dyspnea and shortness of  breath Dermatological: negative for rash Respiratory: negative for cough or wheezing Urologic: negative for hematuria Abdominal: negative for nausea, vomiting, diarrhea, bright red blood per rectum, melena, or hematemesis Neurologic: positive for pre-syncope and dizziness, negative for visual changes, syncope Musculoskeletal: positive for fasciculations and tetany All other systems reviewed and are otherwise negative except as noted above.  Physical Exam: Blood pressure 99/58, pulse 83, temperature 98.2 F (36.8 C), temperature source Oral, resp. rate 18, height 5\' 10"  (1.778 m), weight 157 lb 12.8 oz (71.578 kg), SpO2 91.00%.  General: Well developed, well nourished, NAD Head: Normocephalic, atraumatic, sclera injected, pupils asymmetric ("since childhood" per patient), pupillary constriction direct and consensual bilaterally, EOM in tact bilaterally    Neck: Negative for carotid bruits. JVD not elevated. Supple.  Lungs: Rales noted in lower and mid lung bases, no wheezing or rhonchi , slightly decreased breath sounds Heart: Nondisplaced PMI, RRR with S1 S2. No murmurs, rubs, or gallops appreciated.  Abdomen: Soft, non-tender, non-distended with normoactive bowel sounds. No hepatomegaly. No rebound/guarding. No obvious abdominal masses. Msk:  Involuntary movements bilateral feet, strength and tone appears normal for age.  Extremities: 1+ edema bilateral ankles, no clubbing, cyanosis.  Distal pedal pulses are 2+ and equal bilaterally. Neuro: Alert and oriented X 3. Moves all extremities spontaneously. Psych:  Responds to questions appropriately with a normal affect.  Labs: Recent Labs  Basename 06/09/11 0610 06/08/11 0500   WBC 8.4 8.1   HGB 16.3 16.5   HCT 51.9 52.6*   MCV 99.2 98.3   PLT 143* 149*    Lab 06/09/11 0610 06/08/11 0500 06/07/11 1146  NA 138 139 135  K 4.7 4.6 5.2*  CL 93* 98 98  CO2 38* 33* 29  BUN 27* 25* 28*  CREATININE 1.01 1.05 0.92  CALCIUM 8.9 9.0 8.8   PROT -- -- 7.1  BILITOT -- -- 0.2*  ALKPHOS -- -- 77  ALT -- -- 43  AST -- -- 53*  AMYLASE -- -- --  LIPASE -- -- --  GLUCOSE 249* 102* 171*   Recent Labs  Basename 06/07/11 1614   HGBA1C 8.5*   Recent Labs  Basename 06/08/11 0907 06/07/11 2327 06/07/11 1614   CKTOTAL 225 337* 366*   CKMB 17.1* 22.5* 28.5*   CKMBINDEX -- -- --   TROPONINI 1.65* 1.63* 1.08*   Radiology/Studies: Dg Chest 2 View  06/07/2011  *RADIOLOGY REPORT*  Clinical Data: Near-syncope.  Shortness of breath.  CHEST - 2 VIEW  Comparison: Chest 03/23/2009.  Findings: There is interstitial pulmonary edema and small bilateral pleural effusions.  The patient is status post CABG.  Heart size normal.  IMPRESSION: Congestive heart failure.  Original Report Authenticated By: Bernadene Bell. D'ALESSIO, M.D.    EKG:  Sinus tachycardia at 119  bpm, LAD, ST depressions V5, V6, aVL  ASSESSMENT AND PLAN:   1. NSTEMI- pt with multiple cardiac risk factors and known CAD in the past with poor cardiac follow-up and no recent cardiac work-up, positive initial CEs  - Will admit to telemetry  - Cycle cardiac enzymes  - Heparin gtt  - Continue home ASA  - Start BB, statin, ACEI  - NTG SL PRN if develops cp  - Plan for cath Monday  2. Acute congestive heart failure- likely in the setting of new ischemia, pt with rales on exam, pulmonary edema on CXR  - Will order BNP  - Lasix 40mg  IV BID  - Daily BMETs  - Strict I/Os, daily weights  - Will order 2D echo  - Hydration based on echo  3. Hyperkalemia- pt with fasciculations and mild tetany; K at 5.2 today  - Diuresing with Lasix  - Continue to monitor  4. IDDM  - SSI per protocol  5. Respiratory acidosis- pH 7.215, pCO2 89.3; O2 sat. 98% on 2L n/c; breathing well now  - Will order nebs  - PFTs after cath  - Repeat ABG in a couple of days  6. Polycythemia- likely secondary to chronic hypoxemia  - Repeat CBC  7. Tobacco abuse- ~50 pack-year history, will need cessation  counseling   Signed, R. Hurman Horn, PA-C 06/10/2011, 5:32 AM  Attending Note:   The patient was seen and examined.  Agree with assessment and plan as noted above.  This patient also has significant COPD.  His Hb. Is 18.  He has an acute respiratory acidosos.  His ABG also suggests that he has elevated CO2.  pH 7.215, pCO2 89.3; O2 sat. 98% on 2L n/c . Plan: Tune up over the weekend. Diurese. Aggressive pulmonary nebs, PFTs,  Cath on Monday   Vesta Mixer, Montez Hageman., MD, [facility] 06/10/2011, 5:32 AM

## 2011-06-10 NOTE — Progress Notes (Signed)
   CARE MANAGEMENT NOTE 06/10/2011  Patient:  James Moon, James Moon   Account Number:  [id number]  Date Initiated:  06/10/2011  Documentation initiated by:  Shannan Harper  Subjective/Objective Assessment:   Patient admitted with NSTEMI and CHF component, Hx of severe COPD.     Action/Plan:   Patient lives at home alone, widowed, has one child here in [place].   Anticipated DC Date:     Anticipated DC Plan:        DC Planning Services  CM consult      Choice offered to / List presented to:             Status of service:  In process, will continue to follow Medicare Important Message given?   (If response is "NO", the following Medicare IM given date fields will be blank) Date Medicare IM given:   Date Additional Medicare IM given:    Discharge Disposition:    Per UR Regulation:  Reviewed for med. necessity/level of care/duration of stay  Comments:  Heart Failure Patient:  UR Completed. 06/10/11 0930 Shannan Harper, RN, BSN

## 2011-06-10 NOTE — Progress Notes (Signed)
ANTICOAGULATION CONSULT NOTE - Follow Up Consult  Pharmacy Consult for Heparin to resume 8 hrs after sheath pulled s/p cath Indication: ACS  No Known Allergies   Vital Signs: Temp: 98.1 F (36.7 C) (12/10 1645) Temp src: Oral (12/10 1330) BP: 100/58 mmHg (12/10 1330) Pulse Rate: 76  (12/10 1420)  Labs:  Basename 06/10/11 0710 06/09/11 0610 06/08/11 1450 06/08/11 0907 06/08/11 0500 06/07/11 2327  HGB 17.2* 16.3 -- -- -- --  HCT 55.2* 51.9 -- -- 52.6* --  PLT 135* 143* -- -- 149* --  APTT -- -- -- -- -- --  LABPROT -- -- -- -- -- --  INR -- -- -- -- -- --  HEPARINUNFRC 0.39 0.56 0.52 -- -- --  CREATININE 1.00 1.01 -- -- 1.05 --  CKTOTAL -- -- -- 225 -- 337*  CKMB -- -- -- 17.1* -- 22.5*  TROPONINI -- -- -- 1.65* -- 1.63*   Estimated Creatinine Clearance: 74.6 ml/min (by C-G formula based on Cr of 1).  Assessment: 65yo male with NSTEMI, s/p cath today.  Heparin level was therapeutic this morning on 1200 units/hr.  Sheath pull end time to be 1730 per RN. No bleeding reported. To have PCI 06/11/11.  Goal of Therapy:  Heparin level 0.3-0.7 units/ml   Plan:  Resume heparin 8 hrs s/p sheath pull at 0100 am at rate if 1200 units/hr.  Check 8 hr heparin level if pt not gone to PCI. Len Childs T 06/10/2011,5:23 PM

## 2011-06-10 NOTE — Progress Notes (Signed)
Site area: right groin  Site Prior to Removal:  Level 0  Pressure Applied For   20 MINUTES    Minutes Beginning at 1560   Manual:   yes  Patient Status During Pull: stable Post Pull Groin Site:  Level 0  Post Pull Instructions Given:  yes  Post Pull Pulses Present:  yes  Dressing Applied:  yes  Comments:  Blood pressure 90s systolic during holding then 80's sbp post holding. Dr. Elease Hashimoto notified

## 2011-06-10 NOTE — Progress Notes (Signed)
Patient ID: James Moon, male   DOB: Dec 24, 1945, 65 y.o.   MRN: [id number] Patient stable.  I spoke with his daughter.  I also spoke with the pulmonary service and asked them to see him in consult.  He has severe lung disease by ABG with PO2 of 40 and PCO2 in the 70s.  O2 has been increased to 4L, but just under 90% sat.  He is alert and no evidence of progressive retention.  Also, his LV is down.  Will recheck ABG in am, and reassess status.  His glucoses are also high and will need better treatment.    Shawnie Pons 8:49 PM 06/10/2011

## 2011-06-10 NOTE — Consult Note (Signed)
Pt smokes 1ppd plus. Pt in action stage and says he's very interested in quitting. Wants to quit cold [place]. Mentioned other options including NRT and other medical aids available if unable to quit on his own.Referred to 1-800 quit now for f/u and support. Discussed oral fixation substitutes, second hand smoke and in home smoking policy. Reviewed and gave pt Written education/contact information.

## 2011-06-11 ENCOUNTER — Other Ambulatory Visit: Payer: Self-pay

## 2011-06-11 ENCOUNTER — Encounter (HOSPITAL_COMMUNITY)
Admission: EM | Disposition: A | Discharge status: Home Health | Payer: Self-pay | Source: Home / Self Care | Attending: Cardiovascular Disease

## 2011-06-11 DIAGNOSIS — J9610 Chronic respiratory failure, unspecified whether with hypoxia or hypercapnia: Secondary | ICD-10-CM

## 2011-06-11 DIAGNOSIS — J438 Other emphysema: Secondary | ICD-10-CM

## 2011-06-11 DIAGNOSIS — I25810 Atherosclerosis of coronary artery bypass graft(s) without angina pectoris: Secondary | ICD-10-CM

## 2011-06-11 HISTORY — PX: PERCUTANEOUS CORONARY STENT INTERVENTION (PCI-S): SHX5485

## 2011-06-11 LAB — BASIC METABOLIC PANEL
BUN: 28 mg/dL — ABNORMAL HIGH (ref 6–23)
CO2: 36 mEq/L — ABNORMAL HIGH (ref 19–32)
Chloride: 90 mEq/L — ABNORMAL LOW (ref 96–112)
Creatinine, Ser: 0.97 mg/dL (ref 0.50–1.35)
Glucose, Bld: 305 mg/dL — ABNORMAL HIGH (ref 70–99)

## 2011-06-11 LAB — CBC
HCT: 53.2 % — ABNORMAL HIGH (ref 39.0–52.0)
Hemoglobin: 16.9 g/dL (ref 13.0–17.0)
MCV: 97.1 fL (ref 78.0–100.0)
Platelets: 135 10*3/uL — ABNORMAL LOW (ref 150–400)
RBC: 5.48 MIL/uL (ref 4.22–5.81)
WBC: 5.9 10*3/uL (ref 4.0–10.5)

## 2011-06-11 LAB — GLUCOSE, CAPILLARY
Glucose-Capillary: 288 mg/dL — ABNORMAL HIGH (ref 70–99)
Glucose-Capillary: 311 mg/dL — ABNORMAL HIGH (ref 70–99)
Glucose-Capillary: 256 mg/dL — ABNORMAL HIGH (ref 70–99)
Glucose-Capillary: 186 mg/dL — ABNORMAL HIGH (ref 70–99)
Glucose-Capillary: 319 mg/dL — ABNORMAL HIGH (ref 70–99)

## 2011-06-11 LAB — HEPARIN LEVEL (UNFRACTIONATED): Heparin Unfractionated: 0.36 IU/mL (ref 0.30–0.70)

## 2011-06-11 LAB — BLOOD GAS, ARTERIAL
TCO2: 41.6 mmol/L (ref 0–100)
pCO2 arterial: 65 mmHg (ref 35.0–45.0)
pH, Arterial: 7.402 (ref 7.350–7.450)

## 2011-06-11 SURGERY — PERCUTANEOUS CORONARY STENT INTERVENTION (PCI-S)
Anesthesia: LOCAL

## 2011-06-11 MED ORDER — NITROGLYCERIN 0.2 MG/ML ON CALL CATH LAB
INTRAVENOUS | Status: AC
  Filled 2011-06-11: qty 1

## 2011-06-11 MED ORDER — SODIUM CHLORIDE 0.9 % IV SOLN
1.0000 mL/kg/h | INTRAVENOUS | Status: DC
  Administered 2011-06-11: 1 mL/kg/h via INTRAVENOUS

## 2011-06-11 MED ORDER — SODIUM CHLORIDE 0.9 % IV SOLN
1.0000 mL/kg/h | INTRAVENOUS | Status: AC
  Administered 2011-06-11: 1 mL/kg/h via INTRAVENOUS

## 2011-06-11 MED ORDER — ASPIRIN 81 MG PO CHEW: 324.0000 mg | CHEWABLE_TABLET | ORAL | Status: DC

## 2011-06-11 MED ORDER — CLOPIDOGREL BISULFATE 75 MG PO TABS
300.0000 mg | ORAL_TABLET | Freq: Once | ORAL | Status: AC
  Administered 2011-06-11: 300 mg via ORAL
  Filled 2011-06-11: qty 4

## 2011-06-11 MED ORDER — FUROSEMIDE 10 MG/ML IJ SOLN: 40.0000 mg | Freq: Two times a day (BID) | INTRAMUSCULAR | Status: DC

## 2011-06-11 MED ORDER — HEPARIN (PORCINE) IN NACL 2-0.9 UNIT/ML-% IJ SOLN
INTRAMUSCULAR | Status: AC
  Filled 2011-06-11: qty 2000

## 2011-06-11 MED ORDER — LIDOCAINE HCL (PF) 1 % IJ SOLN
INTRAMUSCULAR | Status: AC
  Filled 2011-06-11: qty 30

## 2011-06-11 MED ORDER — MIDAZOLAM HCL 2 MG/2ML IJ SOLN
INTRAMUSCULAR | Status: AC
  Filled 2011-06-11: qty 2

## 2011-06-11 MED ORDER — SODIUM CHLORIDE 0.9 % IJ SOLN: 3.0000 mL | INTRAMUSCULAR | Status: DC | PRN

## 2011-06-11 MED ORDER — ASPIRIN 81 MG PO CHEW
324.0000 mg | CHEWABLE_TABLET | ORAL | Status: AC
  Administered 2011-06-11: 324 mg via ORAL
  Filled 2011-06-11: qty 4

## 2011-06-11 MED ORDER — BIVALIRUDIN 250 MG IV SOLR
INTRAVENOUS | Status: AC
  Filled 2011-06-11: qty 250

## 2011-06-11 MED ORDER — SODIUM CHLORIDE 0.9 % IJ SOLN
3.0000 mL | Freq: Two times a day (BID) | INTRAMUSCULAR | Status: DC
  Administered 2011-06-11 – 2011-06-14 (×7): 3 mL via INTRAVENOUS

## 2011-06-11 MED ORDER — INSULIN ASPART 100 UNIT/ML ~~LOC~~ SOLN
0.0000 [IU] | Freq: Every day | SUBCUTANEOUS | Status: DC
  Administered 2011-06-11: 4 [IU] via SUBCUTANEOUS

## 2011-06-11 MED ORDER — FUROSEMIDE 10 MG/ML IJ SOLN
40.0000 mg | Freq: Every day | INTRAMUSCULAR | Status: DC
  Administered 2011-06-11 – 2011-06-12 (×2): 40 mg via INTRAVENOUS
  Filled 2011-06-11: qty 4

## 2011-06-11 NOTE — Consult Note (Signed)
Name: NITIN MCKOWEN MRN: [id number] DOB: 15-Dec-1945    LOS: 4  Pulmonary F/u Hosp visit  Requesting physician:  Shawnie Pons, MD  Reason for consult:  Suspected COPD  History of Present Illness: 65 yo WM active smoker with CAD s/p CABG x 4 in 1996 who presented to [facility] ED on 12/07 with worsening SOB, dizziness and weakness. Cardiac catheterization on 12/10 demonstrated   Estimated EF of 25% and both grafts requiring revascularization.  Pulmonology consultation was requested given hypoxia, hypercarbia and suspected chronic obstructive pulmonary disease.   .    Vital Signs: Temp:  [98.1 F (36.7 C)-98.4 F (36.9 C)] 98.4 F (36.9 C) (12/11 1100) Pulse Rate:  [71-89] 71  (12/11 1100) Resp:  [13-22] 17  (12/11 1100) BP: (85-104)/(45-57) 92/51 mmHg (12/11 1100) SpO2:  [89 %-94 %] 94 % (12/11 1300) Weight:  [157 lb 6.5 oz (71.4 kg)] 157 lb 6.5 oz (71.4 kg) (12/11 0625) I/O last 3 completed shifts: In: 360 [P.O.:360] Out: 2550 [Urine:2550]  Physical Examination: General:  No acute distress Neuro:  Awake, alert and cooperative   HEENT:  PERRL Neck:  No JVD or cervical adenopathy Cardiovascular:  S1/S2, no M/R/G Lungs:  Very diminished bilateral air entry, few bibasilar rales, prolonged expiratory phase Abdomen:  Soft, non tender, non distended Musculoskeletal:  Trace edema LE Skin:  Intact  Labs and Imaging:    Lab 06/11/11 0500 06/10/11 0710 06/09/11 0610  NA 136 136 138  K 4.7 4.8 4.7  CL 90* 91* 93*  CO2 36* 39* 38*  BUN 28* 27* 27*  CREATININE 0.97 1.00 1.01  GLUCOSE 305* 376* 249*    Lab 06/11/11 0500 06/10/11 0710 06/09/11 0610  HGB 16.9 17.2* 16.3  HCT 53.2* 55.2* 51.9  WBC 5.9 7.1 8.4  PLT 135* 135* 143*      Assessment and Plan:  COPD, s/p smoking cessation at admit/ with hypercarbia but well compensated on 02 at present, needs PFT"s  -->strongly encouraged tobacco avoidance -->will continue bronchodilators and start inhaled steroids and  Spiriva -->there is no indications for systemic steroids or antibiotics at this time -->will need home oxygen, goal SpO2 > 88% -->needs full PFT now if feasible and Pulmonary follow up after discharge   Sandrea Hughs, MD Pulmonary and Critical Care Medicine [facility] Cell [phone]

## 2011-06-11 NOTE — H&P (View-Only) (Signed)
TELEMETRY: Reviewed telemetry pt in NSR: Filed Vitals:   06/11/11 0625 06/11/11 0700 06/11/11 0800 06/11/11 0930  BP:   104/54 98/47  Pulse:   79 79  Temp:      TempSrc:      Resp:   18 15  Height:      Weight: 71.4 kg (157 lb 6.5 oz)     SpO2:  94% 91% 89%    Intake/Output Summary (Last 24 hours) at 06/11/11 0955 Last data filed at 06/10/11 2232  Gross per 24 hour  Intake    360 ml  Output   1550 ml  Net  -1190 ml    SUBJECTIVE No chest pain or SOB. No groin complication.  LABS: Basic Metabolic Panel:  Basename 06/11/11 0500 06/10/11 0710  NA 136 136  K 4.7 4.8  CL 90* 91*  CO2 36* 39*  GLUCOSE 305* 376*  BUN 28* 27*  CREATININE 0.97 1.00  CALCIUM 8.9 9.3  MG -- --  PHOS -- --   Liver Function Tests: No results found for this basename: AST:2,ALT:2,ALKPHOS:2,BILITOT:2,PROT:2,ALBUMIN:2 in the last 72 hours No results found for this basename: LIPASE:2,AMYLASE:2 in the last 72 hours CBC:  Basename 06/11/11 0500 06/10/11 0710  WBC 5.9 7.1  NEUTROABS -- --  HGB 16.9 17.2*  HCT 53.2* 55.2*  MCV 97.1 98.4  PLT 135* 135*   Cardiac Enzymes: No results found for this basename: CKTOTAL:3,CKMB:3,CKMBINDEX:3,TROPONINI:3 in the last 72 hours BNP: No components found with this basename: POCBNP:3 D-Dimer: No results found for this basename: DDIMER:2 in the last 72 hours Hemoglobin A1C: No results found for this basename: HGBA1C in the last 72 hours Fasting Lipid Panel: No results found for this basename: CHOL,HDL,LDLCALC,TRIG,CHOLHDL,LDLDIRECT in the last 72 hours Thyroid Function Tests: No results found for this basename: TSH,T4TOTAL,FREET3,T3FREE,THYROIDAB in the last 72 hours Anemia Panel: No results found for this basename: VITAMINB12,FOLATE,FERRITIN,TIBC,IRON,RETICCTPCT in the last 72 hours  Radiology/Studies:  Dg Chest 2 View  06/07/2011  *RADIOLOGY REPORT*  Clinical Data: Near-syncope.  Shortness of breath.  CHEST - 2 VIEW  Comparison: Chest 03/23/2009.   Findings: There is interstitial pulmonary edema and small bilateral pleural effusions.  The patient is status post CABG.  Heart size normal.  IMPRESSION: Congestive heart failure.  Original Report Authenticated By: Bernadene Bell. Maricela Curet, M.D.    PHYSICAL EXAM General: Well developed, chronically ill appearing, in no acute distress. Head: Normocephalic, atraumatic, sclera non-icteric, no xanthomas, nares are without discharge. Neck: Negative for carotid bruits. JVD not elevated. Lungs: Clear bilaterally to auscultation without wheezes, rales, or rhonchi. Breathing is unlabored. Heart: RRR S1 S2 without murmurs, rubs, or gallops.  Abdomen: Soft, non-tender, non-distended with normoactive bowel sounds. No hepatomegaly. No rebound/guarding. No obvious abdominal masses. Msk:  Strength and tone appears normal for age. Extremities: No clubbing, cyanosis or edema.  Distal pedal pulses are 2+ and equal bilaterally.No right groin hematoma. Neuro: Alert and oriented X 3. Moves all extremities spontaneously. Psych:  Responds to questions appropriately with a normal affect.  ASSESSMENT AND PLAN: 1. NSTEMI. S/p CABG 1996. Severe focal vein graft disease in SVG to OM and SVG to RCA. [place] to LAD is patent.  2. CHF with ischemic cardiomyopathy. EF 25 %. Compensated. 3. COPD with chronic hypoxemia and hypercarbia.  4. IDDM 5. Tobacco abuse.  Plan: Appreciate pulmonary input. Patient is chronically compensated on O2. Will proceed with PCI of SVGs today. Patient is not a candidate for redo CABG due to significant COPD. Vein graft PCI is  higher risk due to old grafts, DM, and poor LV function. Discussed risk of MI, CHF, arrhythmias, and death with patient. Of options PCI offers best chance for favorable outcome. Patient loaded with Plavix. Questions answered. He is agreeable to proceed.   Principal Problem:  *NSTEMI (non-ST elevated myocardial infarction) Active Problems:  CAD (coronary artery disease)  CHF  (congestive heart failure)  IDDM (insulin dependent diabetes mellitus)  Tobacco abuse    Signed, Patrick Salemi [place] MD

## 2011-06-11 NOTE — Progress Notes (Signed)
ANTICOAGULATION CONSULT NOTE - Follow Up Consult  Pharmacy Consult for Heparin Indication: NSTEMI  No Known Allergies  Patient Measurements: Height: 5\' 10"  (177.8 cm) Weight: 157 lb 6.5 oz (71.4 kg) IBW/kg (Calculated) : 73  Adjusted Body Weight:   Vital Signs: Temp: 98.4 F (36.9 C) (12/11 0531) Temp src: Oral (12/11 0531) BP: 98/47 mmHg (12/11 0930) Pulse Rate: 79  (12/11 0930)  Labs:  Basename 06/11/11 1020 06/11/11 0500 06/10/11 0710 06/09/11 0610  HGB -- 16.9 17.2* --  HCT -- 53.2* 55.2* 51.9  PLT -- 135* 135* 143*  APTT -- -- -- --  LABPROT -- -- -- --  INR -- -- -- --  HEPARINUNFRC 0.36 -- 0.39 0.56  CREATININE -- 0.97 1.00 1.01  CKTOTAL -- -- -- --  CKMB -- -- -- --  TROPONINI -- -- -- --   Estimated Creatinine Clearance: 76.7 ml/min (by C-G formula based on Cr of 0.97).   Medications:  Heparin 1200 units/hr  Assessment: 65yom on heparin for NSTEMI. Heparin was restarted last PM s/p cath. Heparin level (0.36) continues to be therapeutic on 1200 units/hr. Pt has PCI scheduled for this afternoon. - H/H and Plts stable - No significant bleeding reported  Goal of Therapy:   Heparin level 0.3-0.7 units/ml   Plan:  1. Continue heparin 1200 units/hr  (12 ml/hr) 2. Follow-up post-PCI orders  Cleon Dew [phone] 06/11/2011,11:50 AM

## 2011-06-11 NOTE — Interval H&P Note (Signed)
History and Physical Interval Note:  06/11/2011 3:09 PM  James Moon  has presented today for surgery, with the diagnosis of Intervention  The various methods of treatment have been discussed with the patient and family. After consideration of risks, benefits and other options for treatment, the patient has consented to  Procedure(s): PERCUTANEOUS CORONARY STENT INTERVENTION (PCI-S) as a surgical intervention .  The patients' history has been reviewed, patient examined, no change in status, stable for surgery.  I have reviewed the patients' chart and labs.  Questions were answered to the patient's satisfaction.     Theron Arista [place]

## 2011-06-11 NOTE — Progress Notes (Signed)
TELEMETRY: Reviewed telemetry pt in NSR: Filed Vitals:   06/11/11 0625 06/11/11 0700 06/11/11 0800 06/11/11 0930  BP:   104/54 98/47  Pulse:   79 79  Temp:      TempSrc:      Resp:   18 15  Height:      Weight: 71.4 kg (157 lb 6.5 oz)     SpO2:  94% 91% 89%    Intake/Output Summary (Last 24 hours) at 06/11/11 0955 Last data filed at 06/10/11 2232  Gross per 24 hour  Intake    360 ml  Output   1550 ml  Net  -1190 ml    SUBJECTIVE No chest pain or SOB. No groin complication.  LABS: Basic Metabolic Panel:  Basename 06/11/11 0500 06/10/11 0710  NA 136 136  K 4.7 4.8  CL 90* 91*  CO2 36* 39*  GLUCOSE 305* 376*  BUN 28* 27*  CREATININE 0.97 1.00  CALCIUM 8.9 9.3  MG -- --  PHOS -- --   Liver Function Tests: No results found for this basename: AST:2,ALT:2,ALKPHOS:2,BILITOT:2,PROT:2,ALBUMIN:2 in the last 72 hours No results found for this basename: LIPASE:2,AMYLASE:2 in the last 72 hours CBC:  Basename 06/11/11 0500 06/10/11 0710  WBC 5.9 7.1  NEUTROABS -- --  HGB 16.9 17.2*  HCT 53.2* 55.2*  MCV 97.1 98.4  PLT 135* 135*   Cardiac Enzymes: No results found for this basename: CKTOTAL:3,CKMB:3,CKMBINDEX:3,TROPONINI:3 in the last 72 hours BNP: No components found with this basename: POCBNP:3 D-Dimer: No results found for this basename: DDIMER:2 in the last 72 hours Hemoglobin A1C: No results found for this basename: HGBA1C in the last 72 hours Fasting Lipid Panel: No results found for this basename: CHOL,HDL,LDLCALC,TRIG,CHOLHDL,LDLDIRECT in the last 72 hours Thyroid Function Tests: No results found for this basename: TSH,T4TOTAL,FREET3,T3FREE,THYROIDAB in the last 72 hours Anemia Panel: No results found for this basename: VITAMINB12,FOLATE,FERRITIN,TIBC,IRON,RETICCTPCT in the last 72 hours  Radiology/Studies:  Dg Chest 2 View  06/07/2011  *RADIOLOGY REPORT*  Clinical Data: Near-syncope.  Shortness of breath.  CHEST - 2 VIEW  Comparison: Chest 03/23/2009.   Findings: There is interstitial pulmonary edema and small bilateral pleural effusions.  The patient is status post CABG.  Heart size normal.  IMPRESSION: Congestive heart failure.  Original Report Authenticated By: THOMAS L. D'ALESSIO, M.D.    PHYSICAL EXAM General: Well developed, chronically ill appearing, in no acute distress. Head: Normocephalic, atraumatic, sclera non-icteric, no xanthomas, nares are without discharge. Neck: Negative for carotid bruits. JVD not elevated. Lungs: Clear bilaterally to auscultation without wheezes, rales, or rhonchi. Breathing is unlabored. Heart: RRR S1 S2 without murmurs, rubs, or gallops.  Abdomen: Soft, non-tender, non-distended with normoactive bowel sounds. No hepatomegaly. No rebound/guarding. No obvious abdominal masses. Msk:  Strength and tone appears normal for age. Extremities: No clubbing, cyanosis or edema.  Distal pedal pulses are 2+ and equal bilaterally.No right groin hematoma. Neuro: Alert and oriented X 3. Moves all extremities spontaneously. Psych:  Responds to questions appropriately with a normal affect.  ASSESSMENT AND PLAN: 1. NSTEMI. S/p CABG 1996. Severe focal vein graft disease in SVG to OM and SVG to RCA. [place] to LAD is patent.  2. CHF with ischemic cardiomyopathy. EF 25 %. Compensated. 3. COPD with chronic hypoxemia and hypercarbia.  4. IDDM 5. Tobacco abuse.  Plan: Appreciate pulmonary input. Patient is chronically compensated on O2. Will proceed with PCI of SVGs today. Patient is not a candidate for redo CABG due to significant COPD. Vein graft PCI is   higher risk due to old grafts, DM, and poor LV function. Discussed risk of MI, CHF, arrhythmias, and death with patient. Of options PCI offers best chance for favorable outcome. Patient loaded with Plavix. Questions answered. He is agreeable to proceed.   Principal Problem:  *NSTEMI (non-ST elevated myocardial infarction) Active Problems:  CAD (coronary artery disease)  CHF  (congestive heart failure)  IDDM (insulin dependent diabetes mellitus)  Tobacco abuse    Signed, Peter Jordan MD   

## 2011-06-11 NOTE — Progress Notes (Signed)
Inpatient Diabetes Program Recommendations  AACE/ADA: New Consensus Statement on Inpatient Glycemic Control (2009)  Target Ranges:  Prepandial:   less than 140 mg/dL      Peak postprandial:   less than 180 mg/dL (1-2 hours)      Critically ill patients:  140 - 180 mg/dL   CBGs 29/56: 213/ 086/ 283/ 279 mg/dl CBGs today: 578/ 469 mg/dl  Inpatient Diabetes Program Recommendations Insulin - Basal: Add Lantus 28 units daily (this is 80% of home insulin basal regimen). Insulin - Meal Coverage: Once eating, consider adding Novolog 4 units tid with meals (hold if patient eats less than 50%)  Note: Pt normally takes 70/30 insulin at home (15 units in the morning with breakfast & 25 units in the evening with supper). CBGs are elevated during this hospitalization b/c patient is not getting any basal insulin.  Please either start Lantus and meal coverage OR restart home 70/30 insulin this evening with supper after his procedure.

## 2011-06-11 NOTE — Op Note (Signed)
CARDIAC CATH NOTE  Name: James Moon MRN: [id number] DOB: 01-14-1946  Procedure: PTCA and stenting of the saphenous vein graft sequentially to the first and second obtuse marginal branches, saphenous vein graft to the PDA.  Indication: 65 year old white male status post coronary bypass surgery in 1996 who presents with a non-ST elevation myocardial infarction. He had severe stenoses in the saphenous vein graft to the first and second obtuse marginal branches and the saphenous vein graft to the PDA. Ejection fraction was severely reduced at 25%. He has severe COPD and is felt not to be candidate for redo bypass. We elected to proceed with percutaneous intervention of both vein grafts.  Procedural Details: The right groin was prepped, draped, and anesthetized with 1% lidocaine. Using the modified Seldinger technique, a 6 Fr sheath was introduced into the right femoral artery.  Weight-based bivalirudin was given for anticoagulation. Once a therapeutic ACT was achieved, a 6 French left Amplatz 1 guide catheter was inserted.  A filter wire was used to cross the lesion in the saphenous vein graft to the obtuse marginal branches.  The lesion was predilated with a 2.5 mm balloon.  The lesion was then stented with a 3.5 x 38 mm Promus element plus stent.  The stent was dilated to 11 atmospheres with the stent balloon. The filter wire was then retrieved with a retrieval device. On removal there was moderate thrombotic material within the filter. Following PCI, there was 0% residual stenosis and TIMI-3 flow. Final angiography confirmed an excellent result.   We then approached the saphenous vein graft to the PDA. A 6 [place] multipurpose guide was inserted. A filter wire was used to cross the lesion. This lesion was primarily stented with a 3.0 x 20 mm Promus element stent. This was deployed at 11 atmospheres with the stent balloon. The filter wire was then retrieved with a retrieval device. Again there was  modest thrombotic material within the filter. Following PCI, there was 0% residual stenosis and TIMI grade 3 flow. Final angiography confirmed an excellent result. The patient tolerated the procedure well. There were no immediate procedural complications. Femoral hemostasis was achieved with manual compression. The patient was transferred to the post catheterization recovery area for further monitoring.  Lesion Data: Vessel #1: Saphenous vein graft to the first and second obtuse marginal branches. Percent stenosis (pre): 90% TIMI-flow (pre):  3 Stent:  3.5 x 38 mm Promus element plus Percent stenosis (post): 0% TIMI-flow (post): 3  Vessel #2: Saphenous vein graft to the PDA. Percent stenosis (pre): 95% TIMI flow (pre): 3 Stent: 3.0 x 20 mm Promus element Percent stenosis (post): 0% TIMI flow (post): 3.  Conclusions:  1. Successful stenting of the saphenous vein graft to the first and second obtuse marginal branches with a drug-eluting stent. 2. Successful stenting of the saphenous vein graft to the PDA with a drug-eluting stent.  Recommendations: Continue dual antiplatelet therapy for at least one year.  Peter [place] 06/11/2011, 4:26 PM

## 2011-06-12 ENCOUNTER — Inpatient Hospital Stay (HOSPITAL_COMMUNITY): Payer: Medicare Other

## 2011-06-12 ENCOUNTER — Other Ambulatory Visit: Payer: Self-pay

## 2011-06-12 DIAGNOSIS — I2510 Atherosclerotic heart disease of native coronary artery without angina pectoris: Secondary | ICD-10-CM

## 2011-06-12 LAB — CBC
HCT: 53.1 % — ABNORMAL HIGH (ref 39.0–52.0)
Hemoglobin: 16.9 g/dL (ref 13.0–17.0)
MCH: 30.8 pg (ref 26.0–34.0)
MCHC: 31.8 g/dL (ref 30.0–36.0)
MCV: 96.9 fL (ref 78.0–100.0)
Platelets: 123 K/uL — ABNORMAL LOW (ref 150–400)
RBC: 5.48 MIL/uL (ref 4.22–5.81)
RDW: 14.3 % (ref 11.5–15.5)
WBC: 5.9 K/uL (ref 4.0–10.5)

## 2011-06-12 LAB — BASIC METABOLIC PANEL WITH GFR
BUN: 25 mg/dL — ABNORMAL HIGH (ref 6–23)
CO2: 35 meq/L — ABNORMAL HIGH (ref 19–32)
Calcium: 9.6 mg/dL (ref 8.4–10.5)
Chloride: 90 meq/L — ABNORMAL LOW (ref 96–112)
Creatinine, Ser: 0.97 mg/dL (ref 0.50–1.35)
GFR calc Af Amer: >90 mL/min
GFR calc non Af Amer: 85 mL/min — ABNORMAL LOW
Glucose, Bld: 260 mg/dL — ABNORMAL HIGH (ref 70–99)
Potassium: 4.6 meq/L (ref 3.5–5.1)
Sodium: 137 meq/L (ref 135–145)

## 2011-06-12 LAB — GLUCOSE, CAPILLARY
Glucose-Capillary: 87 mg/dL (ref 70–99)
Glucose-Capillary: 260 mg/dL — ABNORMAL HIGH (ref 70–99)

## 2011-06-12 MED ORDER — HEART ATTACK BOUNCING BOOK
Freq: Once | Status: AC
  Administered 2011-06-12: 22:00:00
  Filled 2011-06-12: qty 1

## 2011-06-12 MED ORDER — INSULIN ASPART 100 UNIT/ML ~~LOC~~ SOLN
4.0000 [IU] | Freq: Three times a day (TID) | SUBCUTANEOUS | Status: DC
  Administered 2011-06-12 – 2011-06-14 (×6): 4 [IU] via SUBCUTANEOUS

## 2011-06-12 MED ORDER — INSULIN ASPART 100 UNIT/ML ~~LOC~~ SOLN
0.0000 [IU] | Freq: Three times a day (TID) | SUBCUTANEOUS | Status: DC
  Administered 2011-06-12: 20 [IU] via SUBCUTANEOUS
  Administered 2011-06-13: 11 [IU] via SUBCUTANEOUS
  Administered 2011-06-13: 4 [IU] via SUBCUTANEOUS
  Administered 2011-06-14: 3 [IU] via SUBCUTANEOUS
  Filled 2011-06-12: qty 3

## 2011-06-12 MED ORDER — ALBUTEROL SULFATE (5 MG/ML) 0.5% IN NEBU
2.5000 mg | INHALATION_SOLUTION | Freq: Once | RESPIRATORY_TRACT | Status: AC
  Administered 2011-06-12: 2.5 mg via RESPIRATORY_TRACT

## 2011-06-12 MED ORDER — CLOPIDOGREL BISULFATE 75 MG PO TABS
75.0000 mg | ORAL_TABLET | Freq: Every day | ORAL | Status: DC
  Administered 2011-06-12 – 2011-06-14 (×3): 75 mg via ORAL
  Filled 2011-06-12 (×3): qty 1

## 2011-06-12 MED ORDER — INSULIN ASPART 100 UNIT/ML ~~LOC~~ SOLN
0.0000 [IU] | Freq: Every day | SUBCUTANEOUS | Status: DC
  Administered 2011-06-12: 3 [IU] via SUBCUTANEOUS
  Administered 2011-06-13: 2 [IU] via SUBCUTANEOUS

## 2011-06-12 MED ORDER — INSULIN GLARGINE 100 UNIT/ML ~~LOC~~ SOLN
10.0000 [IU] | SUBCUTANEOUS | Status: DC
  Administered 2011-06-13 – 2011-06-14 (×2): 10 [IU] via SUBCUTANEOUS

## 2011-06-12 MED ORDER — BIOTENE DRY MOUTH MT LIQD
15.0000 mL | Freq: Two times a day (BID) | OROMUCOSAL | Status: DC
  Administered 2011-06-12 – 2011-06-14 (×4): 15 mL via OROMUCOSAL

## 2011-06-12 MED ORDER — INSULIN GLARGINE 100 UNIT/ML ~~LOC~~ SOLN
18.0000 [IU] | Freq: Every day | SUBCUTANEOUS | Status: DC
  Administered 2011-06-12 – 2011-06-13 (×2): 18 [IU] via SUBCUTANEOUS
  Filled 2011-06-12: qty 3

## 2011-06-12 MED ORDER — INSULIN GLARGINE 100 UNIT/ML ~~LOC~~ SOLN
10.0000 [IU] | Freq: Once | SUBCUTANEOUS | Status: AC
  Administered 2011-06-12: 10 [IU] via SUBCUTANEOUS

## 2011-06-12 MED FILL — Dextrose Inj 5%: INTRAVENOUS | Qty: 50 | Status: AC

## 2011-06-12 NOTE — Plan of Care (Signed)
Problem: Phase III Progression Outcomes Goal: Dyspnea controlled with activity Outcome: Completed/Met Date Met:  06/12/11 No complaints of SOB with ambulation

## 2011-06-12 NOTE — Progress Notes (Signed)
Minimal bruising at access site. James Moon

## 2011-06-12 NOTE — Progress Notes (Signed)
Cardiology Progress Note Patient Name: James Moon Date of Encounter: 06/12/2011, 8:06 AM     Subjective  No overnight events. Pt feeling well this morning. He had successful PCI of his 2 SVG lesions yesterday.   Objective   Telemetry: Sinus rhythm 70s-90s with occasional PVCs.  Medications: . albuterol  2.5 mg Nebulization TID  . aspirin  81 mg Oral Daily  . carvedilol  3.125 mg Oral BID WC  . furosemide  40 mg Intravenous BID  . insulin aspart  0-9 Units Subcutaneous TID WC  . rosuvastatin  20 mg Oral q1800  . sodium chloride  3 mL Intravenous Q12H    Physical Exam: Temp:  [98.1 F (36.7 C)-98.7 F (37.1 C)] 98.7 F (37.1 C) (12/12 0441) Pulse Rate:  [71-90] 77  (12/12 0441) Resp:  [11-17] 11  (12/11 2024) BP: (91-102)/(47-62) 101/49 mmHg (12/12 0441) SpO2:  [82 %-94 %] 94 % (12/12 0700) Weight:  [153 lb 14.1 oz (69.8 kg)] 153 lb 14.1 oz (69.8 kg) (12/12 0441)  General: Elderly white male in no acute distress. Head: Normocephalic, atraumatic, sclera non-icteric, nares are without discharge.  Neck: Supple. Negative for carotid bruits or JVD Lungs: Diminished throughout with fine bibasilar rales. No wheezes or rhonchi. Breathing is unlabored. Heart: RRR S1 S2 without murmurs, rubs, or gallops.  Abdomen: Soft, non-tender, non-distended with normoactive bowel sounds. No rebound/guarding. No obvious abdominal masses. Msk:  Strength and tone appear normal for age. Extremities: Trace bilat pedal edema. No clubbing or cyanosis. Distal pedal pulses are 2+ and equal bilaterally. Neuro: Alert and oriented X 3. Moves all extremities spontaneously. Psych:  Responds to questions appropriately with a normal affect.  Intake/Output Summary (Last 24 hours) at 06/12/11 0806 Last data filed at 06/12/11 0600  Gross per 24 hour  Intake    103 ml  Output   1425 ml  Net  -1322 ml   Labs:  [facility] 06/10/11 0710 06/09/11 0610 06/07/11 1146  NA 136 138 --  K 4.8 4.7 --  CL  91* 93* --  CO2 39* 38* --  GLUCOSE 376* 249* --  BUN 27* 27* --  CREATININE 1.00 1.01 --  CALCIUM 9.3 8.9 --  MG -- -- 2.0  PHOS -- -- --   Basename 06/07/11 1146  AST 53*  ALT 43  ALKPHOS 77  BILITOT 0.2*  PROT 7.1  ALBUMIN 3.1*   Basename 06/10/11 0710 06/09/11 0610  WBC 7.1 8.4  HGB 17.2* 16.3  HCT 55.2* 51.9  MCV 98.4 99.2  PLT 135* 143*   Basename 06/08/11 0907 06/07/11 2327 06/07/11 1614 06/07/11 1153  CKTOTAL 225 337* 366* 288*  CKMB 17.1* 22.5* 28.5* 21.9*  TROPONINI 1.65* 1.63* 1.08* 0.39*   Basename 06/07/11 1453  POCBNP 1498.0*   Basename 06/07/11 1614  HGBA1C 8.5*   Basename 06/07/11 1614  TSH 1.661     06/09/2011 13:20  pH, Arterial 7.392  pCO2 arterial 68.4 (HH)  pO2, Arterial 67.8 (L)  Bicarbonate 40.7 (H)  TCO2 42.8  Acid-Base Excess 15.0 (H)  O2 Saturation 92.2   Radiology/Studies:   06/09/11 - 2D Echocardiogram - Left ventricle: The cavity size was mildly dilated. Wall thickness was normal. Systolic function was severely reduced. The estimated ejection fraction was in the range of 20% to 25%. There was an increased relative contribution of atrial contraction to ventricular filling. The deceleration time of the early transmitral flow velocity was decreased. Isovolumic relaxation time was decreased. Doppler  parameters are consistent with both elevated ventricular end-diastolic filling pressure and elevated left atrial filling pressure. E/E' medial TDI approximately 30 - Regional wall motion abnormality: Akinesis and scarring of the mid anteroseptal, mid inferoseptal, apical inferior, apical septal, and apical myocardium; severe hypokinesis of the apical anterior, basal-mid inferior, mid anterolateral, and apical lateral myocardium; hypokinesis of the basal inferoseptal and mid inferolateral myocardium; moderate hypokinesis of the mid anterior myocardium. - Mitral valve: Calcified annulus. Mildly thickened leaflets  - Right ventricle: Unable to  evaluate RVSP Systolic function was mildly reduced. - Atrial septum: No defect or patent foramen ovale was identified.  Dg Chest 2 View 06/07/2011   Findings: There is interstitial pulmonary edema and small bilateral pleural effusions.  The patient is status post CABG.  Heart size normal.  IMPRESSION: Congestive heart failure.     Assessment and Plan  65 yo with PMHx significant for CAD (CABG x 4 in 1996 utilizing SVGs harvested from R leg), IDDM, HTN, tobacco abuse (50 pack-year hx) and colon cancer (s/p colectomy) presenting to [facility] ED via EMS with worsening SOB, dizziness and weakness. He had pulmonary edema on CXR and an elevated BNP and ruled in for an NSTEMI.   1. NSTEMI/CAD:  He had PCI of his SVG to OM1 and OM2 and SVG to PDA.  Tolerated procedure well.  No angina.  2. Acute CHF:  Echo showed EF 20-25%.  He is on coreg, Lisinopril, Lasix.  He is just out of a NSTEMI and he was not on optimal therapy.  His LV function may improve - otherwise he may need an AICD in 3 months.  Repeat  3. COPD: - He has baseline hypoxemia and hypercarbia at rest.  His sat was 85% at rest on RA.  He walked with cardiac rehab this am .  He desaturated down to 84% on 4 lpm [place].  The nurse turned his O2 up to 6 liters and his O2 sat improved to 88%.  PFTs to be done today.  I think he has severe lung disease.  Appreciate pulmonary's input re: DC home O2 needs and follow up.  4. IDDM - He was on Insulin 70/30 - 15 units in AM, 25 units in PM at home.  Will add Lantus 10 units in the AM and 18 units in PM.  He'll need close follow up with his primary.  5. Tobacco abuse - Tobacco cessation counseling   Anticipate DC tomorrow.  Would appreciate input from pulmonary.  Vesta Mixer, Montez Hageman., MD, [facility] 06/12/2011, 8:21 AM

## 2011-06-12 NOTE — Plan of Care (Signed)
Problem: Phase III Progression Outcomes Goal: Fluid volume status improved Outcome: Completed/Met Date Met:  06/12/11 Lasix was d/c'd

## 2011-06-12 NOTE — Plan of Care (Signed)
Problem: Phase II Progression Outcomes Goal: Dyspnea controlled with activity Outcome: Progressing Per cardiac rehab note, pt desats at 6L O2 [place], but denies any SOB.

## 2011-06-12 NOTE — Progress Notes (Signed)
CARDIAC REHAB PHASE I   PRE:  Rate/Rhythm: 89 SR  BP:  Supine:   Sitting: 101/57  Standing:    SaO2: 94 4L 83 RA  MODE:  Ambulation: 680 ft   POST:  Rate/Rhythem: 101 ST  BP:  Supine:   Sitting: 120/60  Standing:    SaO2: 83 4L after approx. 100 ft. Increased O2 to 6L( 88% during 91% after) 1610-9604 On arrival pt on O2 4L sat 94 %. O2 d/c sat down to 83% after 3 minutes, while just sitting on side of bed.. O2 reapplied 4L sat back to 92%. Ambulated on 4L after approx. 100 ft. Sat 83%.O2  increased to 6L. Ambulated on 6L during walk sat 88%, after 92%. He tolerated ambulation well denies any cp or SOB. States that  this breathing is much since he has been in the hospital. After walk pt to wheelchair going for PFT.  James Moon

## 2011-06-12 NOTE — Plan of Care (Signed)
Problem: Phase I Progression Outcomes Goal: Hemodynamically stable Outcome: Progressing Blood pressures have been running low (SBP 80s-90s)

## 2011-06-13 ENCOUNTER — Inpatient Hospital Stay (HOSPITAL_COMMUNITY): Payer: Medicare Other

## 2011-06-13 DIAGNOSIS — J438 Other emphysema: Secondary | ICD-10-CM

## 2011-06-13 DIAGNOSIS — J9610 Chronic respiratory failure, unspecified whether with hypoxia or hypercapnia: Secondary | ICD-10-CM

## 2011-06-13 DIAGNOSIS — I214 Non-ST elevation (NSTEMI) myocardial infarction: Secondary | ICD-10-CM

## 2011-06-13 DIAGNOSIS — J449 Chronic obstructive pulmonary disease, unspecified: Secondary | ICD-10-CM

## 2011-06-13 LAB — GLUCOSE, CAPILLARY
Glucose-Capillary: 277 mg/dL — ABNORMAL HIGH (ref 70–99)
Glucose-Capillary: 226 mg/dL — ABNORMAL HIGH (ref 70–99)

## 2011-06-13 LAB — CBC
MCH: 31 pg (ref 26.0–34.0)
MCHC: 32.2 g/dL (ref 30.0–36.0)
MCV: 96.3 fL (ref 78.0–100.0)
Platelets: 124 10*3/uL — ABNORMAL LOW (ref 150–400)
RDW: 14.3 % (ref 11.5–15.5)

## 2011-06-13 MED ORDER — LISINOPRIL 5 MG PO TABS: 5.0000 mg | ORAL_TABLET | Freq: Every day | ORAL | Status: DC

## 2011-06-13 MED ORDER — CLOPIDOGREL BISULFATE 75 MG PO TABS: 75.0000 mg | ORAL_TABLET | Freq: Every day | ORAL | Status: DC

## 2011-06-13 MED ORDER — ALBUTEROL SULFATE (5 MG/ML) 0.5% IN NEBU: 2.5000 mg | INHALATION_SOLUTION | RESPIRATORY_TRACT | Status: DC | PRN

## 2011-06-13 MED ORDER — FLUTICASONE-SALMETEROL 500-50 MCG/DOSE IN AEPB: 1.0000 | INHALATION_SPRAY | Freq: Two times a day (BID) | RESPIRATORY_TRACT | Status: DC

## 2011-06-13 MED ORDER — FUROSEMIDE 40 MG PO TABS: 40.0000 mg | ORAL_TABLET | Freq: Every day | ORAL | Status: DC

## 2011-06-13 MED ORDER — ROSUVASTATIN CALCIUM 20 MG PO TABS: 20.0000 mg | ORAL_TABLET | Freq: Every day | ORAL | Status: DC

## 2011-06-13 MED ORDER — TIOTROPIUM BROMIDE MONOHYDRATE 18 MCG IN CAPS: 18.0000 ug | ORAL_CAPSULE | Freq: Every day | RESPIRATORY_TRACT | Status: DC

## 2011-06-13 MED ORDER — NITROGLYCERIN 0.4 MG SL SUBL: 0.4000 mg | SUBLINGUAL_TABLET | SUBLINGUAL | Status: DC | PRN

## 2011-06-13 MED ORDER — CARVEDILOL 3.125 MG PO TABS: 3.1250 mg | ORAL_TABLET | Freq: Two times a day (BID) | ORAL | Status: DC

## 2011-06-13 NOTE — Discharge Summary (Signed)
Discharge Summary   Patient ID: James Moon,  MRN: [id number], DOB/AGE: Mar 30, 1946 65 y.o.  Admit date: 06/07/2011 Discharge date: 06/13/2011  Discharge Diagnoses Principal Problem:  *NSTEMI (non-ST elevated myocardial infarction) Active Problems:  CAD (coronary artery disease)  CHF (congestive heart failure)  IDDM (insulin dependent diabetes mellitus)  Tobacco abuse  COPD (chronic obstructive pulmonary disease)   Allergies No Known Allergies  Procedures:   2D echocardiogram without contrast Study Conclusions  - Left ventricle: The cavity size was mildly dilated. Wall thickness was normal. Systolic function was severely reduced. The estimated ejection fraction was in the range of 20% to 25%. There was an increased relative contribution of atrial contraction to ventricular filling. The deceleration time of the early transmitral flow velocity was decreased. Isovolumic relaxation time was decreased. Doppler parameters are consistent with both elevated ventricular end-diastolic filling pressure and elevated left atrial filling pressure. E/E' medial TDI approximately 30 - Regional wall motion abnormality: Akinesis and scarring of the mid anteroseptal, mid inferoseptal, apical inferior, apical septal, and apical myocardium; severe hypokinesis of the apical anterior, basal-mid inferior, mid anterolateral, and apical lateral myocardium; hypokinesis of the basal inferoseptal and mid inferolateral myocardium; moderate hypokinesis of the mid anterior myocardium. - Mitral valve: Calcified annulus. Mildly thickened leaflets . - Right ventricle: Unable to evaluate RVSP Systolic function was mildly reduced. - Atrial septum: No defect or patent foramen ovale was identified.  ------------------------------------------------------------ Labs, prior tests, procedures, and surgery: Coronary artery bypass grafting.  Transthoracic echocardiography. M-mode, complete  2D, spectral Doppler, and color Doppler. Height: Height: 177.8cm. Height: 70in. Weight: Weight: 74.6kg. Weight: 164.1lb. Body mass index: BMI: 23.6kg/m^2. Body surface area: BSA: 1.79m^2. Blood pressure: 99/57. Patient status: Inpatient.  ------------------------------------------------------------  ------------------------------------------------------------ Left ventricle: The cavity size was mildly dilated. Wall thickness was normal. Systolic function was severely reduced. The estimated ejection fraction was in the range of 20% to 25%. Regional wall motion abnormalities: Akinesis and scarring of the mid anteroseptal, mid inferoseptal, apical inferior, apical septal, and apical myocardium; severe hypokinesis of the apical anterior, basal-mid inferior, mid anterolateral, and apical lateral myocardium; hypokinesis of the basal inferoseptal and mid inferolateral myocardium; moderate hypokinesis of the mid anterior myocardium. There was an increased relative contribution of atrial contraction to ventricular filling. The deceleration time of the early transmitral flow velocity was decreased. Isovolumic relaxation time was decreased. The tissue Doppler parameters were abnormal. Abnormal relaxation with increased filling pressures. Doppler parameters are consistent with both elevated ventricular end-diastolic filling pressure and elevated left atrial filling pressure. E/E' medial TDI approximately 30  ------------------------------------------------------------ Aortic valve: Mildly thickened, mildly calcified leaflets. Doppler: No significant regurgitation.  ------------------------------------------------------------ Aorta: The aorta was poorly visualized, normal, not dilated, and non-diseased.  ------------------------------------------------------------ Mitral valve: Calcified annulus. Mildly thickened leaflets . Doppler: No significant regurgitation. Peak gradient: 8mm Hg  (D).  ------------------------------------------------------------ Left atrium: The atrium was at the upper limits of normal in size.  ------------------------------------------------------------ Atrial septum: No defect or patent foramen ovale was identified.  ------------------------------------------------------------ Right ventricle: Unable to evaluate RVSP The cavity size was normal. Systolic function was mildly reduced.  ------------------------------------------------------------ Right atrium: The atrium was normal in size.  ------------------------------------------------------------ Pericardium: There was no pericardial effusion.  ------------------------------------------------------------ Systemic veins: Inferior vena cava: The vessel was normal in size; the respirophasic diameter changes were in the normal range (= 50%); findings are consistent with normal central venous pressure.  ------------------------------------------------------------ Post procedure conclusions Ascending Aorta:  - The aorta was poorly visualized, normal, not dilated, and non-diseased.  Cardiac Catheterization  Lesion Data:  Vessel #1: Saphenous vein graft to the first and second obtuse marginal branches.  Percent stenosis (pre): 90%  TIMI-flow (pre): 3  Stent: 3.5 x 38 mm Promus element plus  Percent stenosis (post): 0%  TIMI-flow (post): 3  Vessel #2: Saphenous vein graft to the PDA.  Percent stenosis (pre): 95%  TIMI flow (pre): 3  Stent: 3.0 x 20 mm Promus element  Percent stenosis (post): 0%  TIMI flow (post): 3.  Conclusions:  1. Successful stenting of the saphenous vein graft to the first and second obtuse marginal branches with a drug-eluting stent.  2. Successful stenting of the saphenous vein graft to the PDA with a drug-eluting stent.  History of Present Illness: Mr. Carchi is a 65 yo with PMHx significant for CAD (CABG x 4 in 1996 utilizing SVGs harvested from R  leg), IDDM, HTN, tobacco abuse (50 pack-year hx) and colon cancer (s/p colectomy) admitted to [facility] hospital with NSTEMI and acute CHF.   He reported a 60-month history of worsening SOB accompanied by lightheadedness, weakness and occasional palpitations experienced after very mild exertion (bending over to tie shoes, walking from one end of the house to the other) with associated worsening ankle swelling, orthopnea, PND and nocturia. He denied chest pain, n/v, abdominal pain.  He has not followed a cardiologist "in years" and denies any cardiac work-up since CABG. He used to follow a PA at [facility], but has not seen her in "a couple years" due to recent unemployment and lack of health insurance.  On 12/07, he awoke, went to the bathroom and became lightheaded upon bending over. He became weak, short of breath and stumbled to his knees, but did not lose consciousness. 911 was called and he was transported to [facility] ED.   In the ED, EKG revealed questionable lateral ischemia, CK-MB and TnI positive at 21.9 and 0.39, respectively, CXR revealed interstitial pulmonary edema and small bilateral pleural effusions, no cardiomegaly; of note, potassium, H/H mildly elevated; blood gases: pH 7.21, pCO2 89.3.   Hospital Course   He was subsequently admitted for NSTEMI with acute CHF. Due to lack of medical follow-up, chronic conditions were managed prior to any cardiac intervention. He was diuresed with Lasix and started on aggressive pulmonary nebs. He was found to be retained CO2 and oxygenation was carefully adjusted.   He diuresed well and breathing improved. 2D echocardiogram revealed the above details, most notably, LVEF 20-25%, akinesis and scarring of the mid anteroseptal, mid inferoseptal, apical inferior, apical septal, and apical myocardium; severe hypokinesis of the apical anterior, basal-mid inferior, mid anterolateral, and apical lateral myocardium; hypokinesis of the basal inferoseptal  and mid inferolateral myocardium; moderate hypokinesis of the mid anterior myocardium and severely reduced systolic dysfunction with mildly dilated LV, wall thickness normal. ACEI was subsequently added.   He underwent cardiac catheterization on 12/10 revealing the above details, most notably including successful stenting of the saphenous vein graft to the first and second obtuse marginal branches with a drug-eluting stent and successful stenting of the saphenous vein graft to the PDA with a drug-eluting stent.   He tolerated the procedure well and his symptoms improved. His BGs were poorly controlled after cath, and insulin was adjusted with resolution. O2 sats remained normal at rest; however, on ambulation decreased to mid 80s with 4L of O2, although asymptomatic. PFTs revealed longstanding obstructive disease. Pulmonary consult recommended COPD management with nebs outpatient.   He will be discharged on ASA, Plavix, BB, Statin, Nitroglycerin  in addition to COPD nebs and low-dose Lasix PO. He has scheduled appointments with [facility] and Pulmonology. He will need close follow-up with PCP for adequate diabetes management. It was emphasized how imperative smoking cessation would be to his lung function and overall quality of health.   Discharge Vitals:  Blood pressure 87/42, pulse 78, temperature 98.8 F (37.1 C), temperature source Oral, resp. rate 20, height 5\' 10"  (1.778 m), weight 68.8 kg (151 lb 10.8 oz), SpO2 97.00%.   Labs: Recent Labs  [facility] 06/13/11 0405 06/12/11 0610   WBC 6.5 5.9   HGB 16.6 16.9   HCT 51.5 53.1*   MCV 96.3 96.9   PLT 124* 123*   No results found for this basename: VITAMINB12,FOLATE,FERRITIN,TIBC,IRON,RETICCTPCT in the last 72 hours No results found for this basename: DDIMER:2 in the last 72 hours  Lab 06/12/11 0610 06/11/11 0500 06/10/11 0710 06/07/11 1146  NA 137 136 136 --  K 4.6 4.7 4.8 --  CL 90* 90* 91* --  CO2 35* 36* 39* --  BUN 25* 28* 27*  --  CREATININE 0.97 0.97 1.00 --  CALCIUM 9.6 8.9 9.3 --  PROT -- -- -- 7.1  BILITOT -- -- -- 0.2*  ALKPHOS -- -- -- 77  ALT -- -- -- 43  AST -- -- -- 53*  AMYLASE -- -- -- --  LIPASE -- -- -- --  GLUCOSE 260* 305* 376* --   Disposition: stable, in good condition  Discharge Orders    Future Appointments: Provider: Department: Dept Phone: Center:   06/26/2011 9:30 AM Sandrea Hughs, MD [facility] [phone] None   07/01/2011 8:30 AM Rosalio Macadamia, NP [facility] [phone] None     Future Orders Please Complete By Expires   Ambulatory referral to Home Health      Comments:   Please evaluate FINNEAN CERAMI for admission to [facility].  Disciplines requested: Nursing and Home Health Aide  Services to provide: Oxygen Via Nasal Cannula at 3 liters/minute at rest, 4 liters/minute on exertion  Physician to follow patient's care (the person listed here will be responsible for signing ongoing orders): PCP  Requested Start of Care Date: Tomorrow for RN, today prior to discharge for oxygen  Special Instructions:  [facility] RN needed for CHF management, HH aide for oxygen outpatient     Follow-up Information    Follow up with Sandrea Hughs, MD on 06/26/2011. (At 9:30AM for COPD management. )    Contact information:   [address] [facility] [address] [place] [address] [place] [address] [phone]       Follow up with Norma Fredrickson, NP on 07/01/2011. (At 8:30AM for recent hospitalization follow-up. You will see Norma Fredrickson for Dr. Elease Hashimoto (cardiology).)    Contact information:   [address] [place] [address] [phone]          Discharge Medications:  Current Discharge Medication List    START taking these medications   Details  albuterol (PROVENTIL) (5 MG/ML) 0.5% nebulizer solution Take 0.5 mLs (2.5 mg total) by nebulization every 4 (four) hours as needed for wheezing or shortness of breath. Qty: 20 mL, Refills: 3     carvedilol (COREG) 3.125 MG tablet Take 1 tablet (3.125 mg total) by mouth 2 (two) times daily with a meal. Qty: 60 tablet, Refills: 3    clopidogrel (PLAVIX) 75 MG tablet Take 1 tablet (75 mg total) by mouth daily with breakfast. Qty: 30 tablet,  Refills: 3    Fluticasone-Salmeterol (ADVAIR) 500-50 MCG/DOSE AEPB Inhale 1 puff into the lungs 2 (two) times daily. Qty: 60 each, Refills: 3    furosemide (LASIX) 40 MG tablet Take 1 tablet (40 mg total) by mouth daily. Qty: 30 tablet, Refills: 3    lisinopril (PRINIVIL,ZESTRIL) 5 MG tablet Take 1 tablet (5 mg total) by mouth daily. Qty: 30 tablet, Refills: 3    nitroGLYCERIN (NITROSTAT) 0.4 MG SL tablet Place 1 tablet (0.4 mg total) under the tongue every 5 (five) minutes as needed for chest pain. Qty: 25 tablet, Refills: 3    rosuvastatin (CRESTOR) 20 MG tablet Take 1 tablet (20 mg total) by mouth daily at 6 PM. Qty: 30 tablet, Refills: 3    tiotropium (SPIRIVA) 18 MCG inhalation capsule Place 1 capsule (18 mcg total) into inhaler and inhale daily. Qty: 30 capsule, Refills: 3      CONTINUE these medications which have NOT CHANGED   Details  aspirin EC 81 MG tablet Take 81 mg by mouth daily.      insulin NPH-insulin regular (NOVOLIN 70/30) (70-30) 100 UNIT/ML injection Inject 15-25 Units into the skin 2 (two) times daily with a meal. 15 units in the morning and 25 units in the evening.         Outstanding Labs/Studies: None  Duration of Discharge Encounter: Greater than 30 minutes including physician time.  Signed, R. Hurman Horn, PA-C 06/13/2011, 5:09 PM  Attending Note:   The patient was seen and examined.  Agree with assessment and plan as noted above.  See my note from today  Alvia Grove., MD, [facility] 06/14/2011, 8:39 AM

## 2011-06-13 NOTE — Progress Notes (Signed)
Pt resting on bed ,o2 sat 84% on room air. o2 4l /m [place] applied ,o2 sat monitored

## 2011-06-13 NOTE — Progress Notes (Signed)
CARDIAC REHAB PHASE I   PRE:  Rate/Rhythm: 77 SR  BP:  Supine: 78/42 left arm 79/45 right arm  Sitting:   Standing: 63/36   SaO2: 97 3L 86 RA  MODE:  Ambulation: 680 ft   POST:  Rate/Rhythem: 99 SR  BP:  Supine:   Sitting: 94/57  Standing:     SaO2: 87-89 4L 0740-0910  On arrival pt in bed,with head of bed up BP down he denies any dizziness.Stood pt BP 63/36 continues to deny any dizziness.O2 on 3L 97 %. O2 d/c with him sitting on side of bed after 4 minutes RA sat 86 %. O2 replaced.sat improved to 91% on 3L. Assisted X 1 and used O2 4L to ambulate. O2 sat stayed 87- 89% on O2 4L throughout and after walk. Pt denies any dizziness, cp or SOB with walking. To recliner after walk with call light in reach. Completed discharge education with pt. He declines Outpt CRP , not interested.  Beatrix Fetters

## 2011-06-13 NOTE — Progress Notes (Signed)
Patient Name: James Moon Date of Encounter: 06/13/2011     Principal Problem:  *NSTEMI (non-ST elevated myocardial infarction) Active Problems:  CAD (coronary artery disease)  CHF (congestive heart failure)  IDDM (insulin dependent diabetes mellitus)  Tobacco abuse    SUBJECTIVE: Pt sitting in bed, awake and alert. Denies cp, SOB, dizziness, diaphoresis, palpitations, n/v or abdominal pain.    OBJECTIVE  Filed Vitals:   06/12/11 2009 06/12/11 2022 06/13/11 0033 06/13/11 0538  BP: 90/43  92/55 85/48  Pulse: 78  79 87  Temp: 98.3 F (36.8 C)  98.9 F (37.2 C) 98.4 F (36.9 C)  TempSrc: Oral  Oral Oral  Resp: 18  14 17   Height:      Weight:    68.8 kg (151 lb 10.8 oz)  SpO2: 94% 92% 92% 93%    Intake/Output Summary (Last 24 hours) at 06/13/11 0751 Last data filed at 06/12/11 1829  Gross per 24 hour  Intake    480 ml  Output      0 ml  Net    480 ml   Weight change: -1 kg (-2 lb 3.3 oz)  PHYSICAL EXAM  General: Well developed, well nourished, NAD Head: Normocephalic, atraumatic, sclera injected Neck: Supple without bruits or JVD. Lungs:  Distant breath sounds, resp regular and unlabored, no rales, wheezes or rhonchi Heart: RRR no s3, s4, or murmurs, rubs, heaves, gallops or thrills Abdomen: Soft, non-tender, non-distended, BS + x 4. No hepatomegaly.  Msk:  Strength and tone appears normal for age. Extremities: No clubbing, cyanosis or edema. DP/PT/Radials 2+ and equal bilaterally. Neuro: Alert and oriented X 3. Moves all extremities spontaneously. Psych: Normal affect.  LABS:  Recent Labs  [facility] 06/13/11 0405 06/12/11 0610   WBC 6.5 5.9   HGB 16.6 16.9   HCT 51.5 53.1*   MCV 96.3 96.9   PLT 124* 123*   No results found for this basename: VITAMINB12,FOLATE,FERRITIN,TIBC,IRON,RETICCTPCT in the last 72 hours No results found for this basename: DDIMER:2 in the last 72 hours  Lab 06/12/11 0610 06/11/11 0500 06/10/11 0710 06/07/11 1146  NA 137  136 136 --  K 4.6 4.7 4.8 --  CL 90* 90* 91* --  CO2 35* 36* 39* --  BUN 25* 28* 27* --  CREATININE 0.97 0.97 1.00 --  CALCIUM 9.6 8.9 9.3 --  PROT -- -- -- 7.1  BILITOT -- -- -- 0.2*  ALKPHOS -- -- -- 77  ALT -- -- -- 43  AST -- -- -- 53*  AMYLASE -- -- -- --  LIPASE -- -- -- --  GLUCOSE 260* 305* 376* --   No results found for this basename: HGBA1C in the last 72 hours No results found for this basename: CKTOTAL:3,CKMB:3,CKMBINDEX:3,TROPONINI:3 in the last 72 hours No components found with this basename: POCBNP No results found for this basename: CHOL,HDL,LDLCALC,TRIG,CHOLHDL,LDLDIRECT in the last 72 hours No results found for this basename: TSH,T4TOTAL,FREET3,T3FREE,THYROIDAB in the last 72 hours   TELE: NSR, 70-80 bpm, occasional PVCs, no new ST-T wave changes   ECG: None reported    Radiology/Studies:   PFT  preliminary PFTs indicate reduced diffusing capacity, severe airway obstruction and overinflation characteristic of emphysema with reversibility to nebs  Current Medications:     . albuterol  2.5 mg Nebulization TID  . albuterol  2.5 mg Nebulization Once  . antiseptic oral rinse  15 mL Mouth Rinse BID  . aspirin  81 mg Oral Daily  . carvedilol  3.125 mg  Oral BID WC  . clopidogrel  75 mg Oral Q breakfast  . Fluticasone-Salmeterol  1 puff Inhalation BID  . heart attack bouncing book   Does not apply Once  . insulin aspart  0-20 Units Subcutaneous TID WC  . insulin aspart  0-5 Units Subcutaneous QHS  . insulin aspart  4 Units Subcutaneous TID WC  . insulin glargine  10 Units Subcutaneous Once  . insulin glargine  10 Units Subcutaneous Q0700  . insulin glargine  18 Units Subcutaneous QHS  . lisinopril  5 mg Oral Daily  . rosuvastatin  20 mg Oral q1800  . sodium chloride  3 mL Intravenous Q12H  . tiotropium  18 mcg Inhalation Daily  . DISCONTD: furosemide  40 mg Intravenous Daily  . DISCONTD: insulin aspart  0-5 Units Subcutaneous QHS  . DISCONTD: insulin  aspart  0-9 Units Subcutaneous TID WC    ASSESSMENT AND PLAN: 65 yo with PMHx significant for CAD (CABG x 4 in 1996 utilizing SVGs harvested from R leg), IDDM, HTN, tobacco abuse (50 pack-year hx) and colon cancer (s/p colectomy) presenting to [facility] ED via EMS with worsening SOB, dizziness and weakness. He had pulmonary edema on CXR and an elevated BNP and ruled in for an NSTEMI.   1. NSTEMI/CAD- s/p PCI of SVG to OM1 and OM2, SVG to PDA; recovering well, denies complaints today  2. Acute CHF- reduced EF (20-25%) per echo 12/09 with diffuse akinesis and severe hypokinesis; on Coreg, Lisinopril; Lasix was held yesterday due to hypotension with SBP 70s. He has diuresed well (~ -4L net), dry on exam. Due to recent NSTEMI and the fact that he is on optimal therapy now after several years lacking adequate follow-up, LV function may improve. If not ICD would be warranted (3 months per Dr. Harvie Bridge note). Repeat 2D echocardiogram outpatient.  3. Orthostatic hypotension- pt's BP dropped from SBP 90s sitting (which seems to be his baseline), to SBP 63 standing. He denied dizziness, weakness or instability. Lasix has been held.   4. COPD- preliminary PFTs indicate reduced diffusing capacity, severe airway obstruction and overinflation characteristic of emphysema with reversibility to nebs; pt has 50+ pack year history; curretly 97% on 3L O2 at rest, per nurse, saturation off O2 dropped to 83%, pt's 4L O2 reapplied, upon ambulating in hallway, desatted to 83% with "blue earlobes" per nurse, increased to 6L, saturation increased to 88%, he was asymptomatic; breathing overall improved since admission; will call pulmonary for recs    5. IDDM- BGs ran high yesterday to the 400s, insulin was titrated, better-controlled today; will need to be further monitored and managed outpatient  6. Tobacco abuse- counseled on quitting  Signed, R. Hurman Horn, PA-C 06/13/2011, 7:51 AM  Attending Note:   The patient was  seen and examined.  Agree with assessment and plan as noted above.  See my note from today.  Pt has severe CHF and is on appropriate meds.  We'll need to hold Lasix if he continues to have orthostasis.  Also has significant COPD.  Will need close follow up with pulmonary.  Vesta Mixer, Montez Hageman., MD, [facility] 06/13/2011, 8:46 AM

## 2011-06-13 NOTE — Progress Notes (Signed)
Cardiology Progress Note Patient Name: James Moon Date of Encounter: 06/13/2011, 8:41 AM     Subjective  No overnight events. Pt feeling well this morning. He had successful PCI of his 2 SVG lesions Tuesday.  PFTs yesterday reveal severe COPD with reduced DLCO.  He has had continued issues with hypoxemia - especially with ambulation.  He's requiring 4 lpm of [place] O2 to maintain sats.    Objective   Telemetry: Sinus rhythm 70s-90s with occasional PVCs.  Medications: . albuterol  2.5 mg Nebulization TID  . aspirin  81 mg Oral Daily  . carvedilol  3.125 mg Oral BID WC  . furosemide  40 mg Intravenous BID  . insulin aspart  0-9 Units Subcutaneous TID WC  . rosuvastatin  20 mg Oral q1800  . sodium chloride  3 mL Intravenous Q12H    Physical Exam: Temp:  [98.3 F (36.8 C)-98.9 F (37.2 C)] 98.4 F (36.9 C) (12/13 0538) Pulse Rate:  [76-87] 87  (12/13 0538) Resp:  [14-18] 17  (12/13 0538) BP: (79-92)/(43-55) 85/48 mmHg (12/13 0538) SpO2:  [87 %-94 %] 93 % (12/13 0538) Weight:  [151 lb 10.8 oz (68.8 kg)] 151 lb 10.8 oz (68.8 kg) (12/13 0538)  General: Elderly white male in no acute distress. Head: Normocephalic, atraumatic, sclera non-icteric, nares are without discharge.  Neck: Supple. Negative for carotid bruits or JVD Lungs: Diminished throughout with fine bibasilar rales. No wheezes or rhonchi. Breathing is unlabored. Heart: RRR S1 S2 without murmurs, rubs, or gallops.  Abdomen: Soft, non-tender, non-distended with normoactive bowel sounds. No rebound/guarding. No obvious abdominal masses. Msk:  Strength and tone appear normal for age. Extremities: Trace bilat pedal edema. No clubbing or cyanosis. Distal pedal pulses are 2+ and equal bilaterally. Neuro: Alert and oriented X 3. Moves all extremities spontaneously. Psych:  Responds to questions appropriately with a normal affect.  Intake/Output Summary (Last 24 hours) at 06/13/11 0841 Last data filed at 06/12/11  1829  Gross per 24 hour  Intake    480 ml  Output      0 ml  Net    480 ml   Labs:  Basename 06/10/11 0710 06/09/11 0610 06/07/11 1146  NA 136 138 --  K 4.8 4.7 --  CL 91* 93* --  CO2 39* 38* --  GLUCOSE 376* 249* --  BUN 27* 27* --  CREATININE 1.00 1.01 --  CALCIUM 9.3 8.9 --  MG -- -- 2.0  PHOS -- -- --                         Basename 06/10/11 0710 06/09/11 0610  WBC 7.1 8.4  HGB 17.2* 16.3  HCT 55.2* 51.9  MCV 98.4 99.2  PLT 135* 143*   Basename 06/08/11 0907 06/07/11 2327 06/07/11 1614 06/07/11 1153  CKTOTAL 225 337* 366* 288*  CKMB 17.1* 22.5* 28.5* 21.9*  TROPONINI 1.65* 1.63* 1.08* 0.39*   Basename 06/07/11 1453  POCBNP 1498.0*   Basename 06/07/11 1614  HGBA1C 8.5*   Basename 06/07/11 1614  TSH 1.661     06/09/2011 13:20  pH, Arterial 7.392  pCO2 arterial 68.4 (HH)  pO2, Arterial 67.8 (L)  Bicarbonate 40.7 (H)  TCO2 42.8  Acid-Base Excess 15.0 (H)  O2 Saturation 92.2   Radiology/Studies:   06/09/11 - 2D Echocardiogram - Left ventricle: The cavity size was mildly dilated. Wall thickness was normal. Systolic function was severely reduced. The estimated ejection fraction  was in the range of 20% to 25%. There was an increased relative contribution of atrial contraction to ventricular filling. The deceleration time of the early transmitral flow velocity was decreased. Isovolumic relaxation time was decreased. Doppler parameters are consistent with both elevated ventricular end-diastolic filling pressure and elevated left atrial filling pressure. E/E' medial TDI approximately 30 - Regional wall motion abnormality: Akinesis and scarring of the mid anteroseptal, mid inferoseptal, apical inferior, apical septal, and apical myocardium; severe hypokinesis of the apical anterior, basal-mid inferior, mid anterolateral, and apical lateral myocardium; hypokinesis of the basal inferoseptal and mid inferolateral myocardium; moderate hypokinesis of the mid anterior  myocardium. - Mitral valve: Calcified annulus. Mildly thickened leaflets  - Right ventricle: Unable to evaluate RVSP Systolic function was mildly reduced. - Atrial septum: No defect or patent foramen ovale was identified.  Dg Chest 2 View 06/07/2011   Findings: There is interstitial pulmonary edema and small bilateral pleural effusions.  The patient is status post CABG.  Heart size normal.  IMPRESSION: Congestive heart failure.     Assessment and Plan  65 yo with PMHx significant for CAD (CABG x 4 in 1996 utilizing SVGs harvested from R leg), IDDM, HTN, tobacco abuse (50 pack-year hx) and colon cancer (s/p colectomy) presenting to [facility] ED via EMS with worsening SOB, dizziness and weakness. He had pulmonary edema on CXR and an elevated BNP and ruled in for an NSTEMI.   1. NSTEMI/CAD:  He had PCI of his SVG to OM1 and OM2 and SVG to PDA.  Tolerated procedure well.  No angina.  2. Acute CHF:  Echo showed EF 20-25%.  He is on coreg, Lisinopril, Lasix.  He is just out of a NSTEMI and he was not on optimal therapy.  His LV function may improve - otherwise he may need an AICD in 3 months.  Repeat  3. COPD: - He has baseline hypoxemia and hypercarbia at rest.  His sat was 85% at rest on RA.  He walked with cardiac rehab this am .  He desaturated down to 84% on 4 lpm [place].  The nurse turned his O2 up to 6 liters and his O2 sat improved to 88%.  PFTs to be done today.  I think he has severe lung disease.  Appreciate pulmonary's input re: DC home O2 needs and follow up.  4. IDDM - He was on Insulin 70/30 - 15 units in AM, 25 units in PM at  He'll need close follow up with his primary.  5. Tobacco abuse - Tobacco cessation counseling   Anticipate DC later today.  Would appreciate input from pulmonary.    Return to work after he sees [name] and pulmonary  in the clinic 2-3 weeks   Vesta Mixer, Montez Hageman., MD, [facility] 06/13/2011, 8:41 AM

## 2011-06-13 NOTE — Progress Notes (Signed)
Pt remained stable throughout the night. VSS. R groin site level 0.

## 2011-06-13 NOTE — Progress Notes (Signed)
Name: James Moon MRN: [id number] DOB: 1945-09-24    LOS: 6  Pulmonary F/u Hosp visit  Requesting physician:  Shawnie Pons, MD  Reason for consult:  Suspected COPD  History of Present Illness: 52 yowm  active smoker with CAD s/p CABG x 4 in 1996 who presented to [facility] ED on 12/07 with worsening SOB, dizziness and weakness. Cardiac catheterization on 12/10 demonstrated  EF of 25% and both grafts requiring revascularization, on 12/11.  Pulmonology consultation was requested given hypoxia, hypercarbia and suspected chronic obstructive pulmonary disease.  Overnight  did fine but wearing 02 now 24 h per day, 3lpm at rest and 4lpm walking  Vital Signs: Temp:  [98.3 F (36.8 C)-98.9 F (37.2 C)] 98.4 F (36.9 C) (12/13 0538) Pulse Rate:  [76-87] 87  (12/13 0538) Resp:  [14-18] 17  (12/13 0538) BP: (79-94)/(43-57) 94/57 mmHg (12/13 0922) SpO2:  [87 %-94 %] 93 % (12/13 0931) Weight:  [68.8 kg (151 lb 10.8 oz)] 151 lb 10.8 oz (68.8 kg) (12/13 0538) I/O last 3 completed shifts: In: 583 [P.O.:580; I.V.:3] Out: 1425 [Urine:1425]  Physical Examination: General:  No acute distress Neuro:  Awake, alert and cooperative   HEENT:  PERRL Neck:  No JVD or cervical adenopathy Cardiovascular:  S1/S2, no M/R/G Lungs:  Very diminished bilateral air entry, no wheeze.  Abdomen:  Soft, non tender, non distended Musculoskeletal:  Trace edema LE Skin:  Intact  Labs and Imaging:    Lab 06/12/11 0610 06/11/11 0500 06/10/11 0710  NA 137 136 136  K 4.6 4.7 4.8  CL 90* 90* 91*  CO2 35* 36* 39*  BUN 25* 28* 27*  CREATININE 0.97 0.97 1.00  GLUCOSE 260* 305* 376*    Lab 06/13/11 0405 06/12/11 0610 06/11/11 0500  HGB 16.6 16.9 16.9  HCT 51.5 53.1* 53.2*  WBC 6.5 5.9 5.9  PLT 124* 123* 135*   - PFT's 06/12/11 FEV1  1.20 (35%) ratio is 50   DLC0  110%   Assessment and Plan:  COPD, s/p smoking cessation at admit/ with hypercarbia but well compensated on 02 at present, PFTs suggest marked  obstructive disease. He still has significant hypoxia w/ exertion.  Plan/rec: -strongly encouraged tobacco avoidance -will continue bronchodilators with inhaled steroids and Spiriva plus prn albuterol -there is no indication for systemic steroids or antibiotics at this time -will need home oxygen,  3lpm at rest and 4lpm with activity -follow up with Lexani Corona on 12/26 @0930   Cad complicated by systolic heart failure w/ ef in 25% range. Plan: Per cards.   Hyperglycemia CBG (last 3)   Basename 06/13/11 0832 06/13/11 0630 06/12/11 2142  GLUCAP 90 102* 260*  plan: -resolved.   Smoker  I emphasized that although we never turn away smokers from the pulmonary clinic, we do ask that they understand that the recommendations that we make  won't work nearly as well in the presence of continued cigarette exposure.  In fact, we may very well  reach a point where we can't promise to help the patient if he/she can't quit smoking. (We can and will promise to try to help, we just can't promise what we recommend will really work)    Sandrea Hughs, MD Pulmonary and Critical Care Medicine [facility] Cell [phone]

## 2011-06-13 NOTE — Progress Notes (Signed)
   Heart Failure CARE MANAGEMENT NOTE 06/13/2011  Patient:  James Moon, James Moon   Account Number:  [id number]  Date Initiated:  06/10/2011  Documentation initiated by:  Shannan Harper  Subjective/Objective Assessment:   Patient admitted with NSTEMI and CHF component, Hx of severe COPD.     Action/Plan:   Patient lives at home alone, widowed, has one child here in [place].   Anticipated DC Date:  06/13/2011   Anticipated DC Plan:  HOME W HOME HEALTH SERVICES      DC Planning Services  CM consult      [facility] Choice  HOME HEALTH  DURABLE MEDICAL EQUIPMENT   Choice offered to / List presented to:  C-1 Patient   DME arranged  OXYGEN      DME agency  [facility]     [facility] arranged  HH-1 RN  HH-10 DISEASE MANAGEMENT      HH agency  [facility]   Status of service:  Completed, signed off Medicare Important Message given?   (If response is "NO", the following Medicare IM given date fields will be blank) Date Medicare IM given:   Date Additional Medicare IM given:    Discharge Disposition:  HOME W HOME HEALTH SERVICES  Per UR Regulation:  Reviewed for med. necessity/level of care/duration of stay  Comments:  Heart Failure Patient:  Met with patient to discuss availability of resouces, education, needs at discharge.  Discussed home oxygen and choice provided. Also discussed HHRN on discharge for CHF management and patient agreed and was also provided choice. He decided to go with [facility] on both needs.  Hilda Lias and Darian notified with [facility] and referral placed in [facility].  Will continue to assist as needed.  Also provided patient with a HF education packet.  UR Concurrent Review Completed. 06/13/11 1138 Shannan Harper, RN, BSN  UR Completed. 06/10/11 0930 Shannan Harper, RN, BSN

## 2011-06-14 LAB — CBC
Hemoglobin: 16.4 g/dL (ref 13.0–17.0)
MCHC: 31.7 g/dL (ref 30.0–36.0)
Platelets: 123 10*3/uL — ABNORMAL LOW (ref 150–400)
RDW: 14.4 % (ref 11.5–15.5)

## 2011-06-14 LAB — POCT ACTIVATED CLOTTING TIME: Activated Clotting Time: 155 seconds

## 2011-06-14 MED ORDER — INSULIN NPH ISOPHANE & REGULAR (70-30) 100 UNIT/ML ~~LOC~~ SUSP: 25.0000 [IU] | Freq: Two times a day (BID) | SUBCUTANEOUS | Status: DC

## 2011-06-14 NOTE — Progress Notes (Signed)
Cardiology Progress Note Patient Name: James Moon Date of Encounter: 06/14/2011, 8:14 AM     Subjective  No overnight events. Pt feeling well this morning. He had successful PCI of his 2 SVG lesions Tuesday.  PFTs reveal severe COPD with reduced DLCO.  He has had continued issues with hypoxemia - especially with ambulation.  He's requiring 4 lpm of [place] O2 to maintain sats.   He was to go home yesterday but home O2 could not be arranged.  Those arrangements have now been achieved and he is ready to [facility] home.   Objective   Telemetry: Sinus rhythm 70s-90s with occasional PVCs.  Medications: . albuterol  2.5 mg Nebulization TID  . aspirin  81 mg Oral Daily  . carvedilol  3.125 mg Oral BID WC  . furosemide  40 mg Intravenous BID  . insulin aspart  0-9 Units Subcutaneous TID WC  . rosuvastatin  20 mg Oral q1800  . sodium chloride  3 mL Intravenous Q12H    Physical Exam: Temp:  [98.2 F (36.8 C)-98.8 F (37.1 C)] 98.7 F (37.1 C) (12/14 0602) Pulse Rate:  [75-78] 78  (12/14 0602) Resp:  [15-20] 15  (12/14 0602) BP: (87-105)/(42-61) 90/57 mmHg (12/14 0602) SpO2:  [92 %-98 %] 97 % (12/14 0602) Weight:  [151 lb 0.2 oz (68.5 kg)] 151 lb 0.2 oz (68.5 kg) (12/14 0602)  General: Elderly white male in no acute distress. Head: Normocephalic, atraumatic, sclera non-icteric, nares are without discharge.  Neck: Supple. Negative for carotid bruits or JVD Lungs: Diminished throughout with fine bibasilar rales. No wheezes or rhonchi. Breathing is unlabored. Heart: RRR S1 S2 without murmurs, rubs, or gallops.  Abdomen: Soft, non-tender, non-distended with normoactive bowel sounds. No rebound/guarding. No obvious abdominal masses. Msk:  Strength and tone appear normal for age. Extremities: Trace bilat pedal edema. No clubbing or cyanosis. Distal pedal pulses are 2+ and equal bilaterally. Neuro: Alert and oriented X 3. Moves all extremities spontaneously. Psych:  Responds to questions  appropriately with a normal affect.  Intake/Output Summary (Last 24 hours) at 06/14/11 0814 Last data filed at 06/13/11 2200  Gross per 24 hour  Intake    440 ml  Output      0 ml  Net    440 ml   Labs:  Basename 06/10/11 0710 06/09/11 0610 06/07/11 1146  NA 136 138 --  K 4.8 4.7 --  CL 91* 93* --  CO2 39* 38* --  GLUCOSE 376* 249* --  BUN 27* 27* --  CREATININE 1.00 1.01 --  CALCIUM 9.3 8.9 --  MG -- -- 2.0  PHOS -- -- --                         Basename 06/10/11 0710 06/09/11 0610  WBC 7.1 8.4  HGB 17.2* 16.3  HCT 55.2* 51.9  MCV 98.4 99.2  PLT 135* 143*   Basename 06/08/11 0907 06/07/11 2327 06/07/11 1614 06/07/11 1153  CKTOTAL 225 337* 366* 288*  CKMB 17.1* 22.5* 28.5* 21.9*  TROPONINI 1.65* 1.63* 1.08* 0.39*   Basename 06/07/11 1453  POCBNP 1498.0*   Basename 06/07/11 1614  HGBA1C 8.5*   Basename 06/07/11 1614  TSH 1.661     06/09/2011 13:20  pH, Arterial 7.392  pCO2 arterial 68.4 (HH)  pO2, Arterial 67.8 (L)  Bicarbonate 40.7 (H)  TCO2 42.8  Acid-Base Excess 15.0 (H)  O2 Saturation 92.2   Radiology/Studies:  06/09/11 - 2D Echocardiogram - Left ventricle: The cavity size was mildly dilated. Wall thickness was normal. Systolic function was severely reduced. The estimated ejection fraction was in the range of 20% to 25%. There was an increased relative contribution of atrial contraction to ventricular filling. The deceleration time of the early transmitral flow velocity was decreased. Isovolumic relaxation time was decreased. Doppler parameters are consistent with both elevated ventricular end-diastolic filling pressure and elevated left atrial filling pressure. E/E' medial TDI approximately 30 - Regional wall motion abnormality: Akinesis and scarring of the mid anteroseptal, mid inferoseptal, apical inferior, apical septal, and apical myocardium; severe hypokinesis of the apical anterior, basal-mid inferior, mid anterolateral, and apical lateral  myocardium; hypokinesis of the basal inferoseptal and mid inferolateral myocardium; moderate hypokinesis of the mid anterior myocardium. - Mitral valve: Calcified annulus. Mildly thickened leaflets  - Right ventricle: Unable to evaluate RVSP Systolic function was mildly reduced. - Atrial septum: No defect or patent foramen ovale was identified.  Dg Chest 2 View 06/07/2011   Findings: There is interstitial pulmonary edema and small bilateral pleural effusions.  The patient is status post CABG.  Heart size normal.  IMPRESSION: Congestive heart failure.     Assessment and Plan  65 yo with PMHx significant for CAD (CABG x 4 in 1996 utilizing SVGs harvested from R leg), IDDM, HTN, tobacco abuse (50 pack-year hx) and colon cancer (s/p colectomy) presenting to [facility] ED via EMS with worsening SOB, dizziness and weakness. He had pulmonary edema on CXR and an elevated BNP and ruled in for an NSTEMI.   1. NSTEMI/CAD:  He had PCI of his SVG to OM1 and OM2 and SVG to PDA.  Tolerated procedure well.  No angina.  2. Acute CHF:  Echo showed EF 20-25%.  He is on coreg, Lisinopril, Lasix.  He is just out of a NSTEMI and he was not on optimal therapy.  His LV function may improve - otherwise he may need an AICD in 3 months.  Repeat  3. COPD: - He has baseline hypoxemia and hypercarbia at rest.  His sat was 85% at rest on RA.  He walked with cardiac rehab this am .  He desaturated down to 84% on 4 lpm [place].  The nurse turned his O2 up to 6 liters and his O2 sat improved to 88%.  Dr. Sherene Sires has seen.  He needs O2 3 lpm at rest and 4 lpm with exertion.  4. IDDM - He was on Insulin 70/30 - 15 units in AM, 25 units in PM at  He'll need close follow up with his primary.  5. Tobacco abuse - Tobacco cessation counseling   Anticipate DC later today.     Return to work after he sees [name] and pulmonary  in the clinic 2-3 weeks   Vesta Mixer, Montez Hageman., MD, [facility] 06/14/2011, 8:14 AM

## 2011-06-14 NOTE — Progress Notes (Signed)
06-14-11 On 06-13-11 received on-call call from hospital operator on behalf of unit 2500. Spoke with patient's nurse who states pt needs home 02 and HHC RN. Asked nursing to fax orders, face sheet, progress notes to [facility]. Confirmed with [facility] after hours that necessary information was received. Confirmed that O2 would be delivered to patient's room around 930pm or so. Made patient's nurse aware of [facility] plan.   Raiford Noble, RN,BSN, CM (on-call) [phone]

## 2011-06-14 NOTE — Progress Notes (Signed)
An order for James Moon aide for oxygen and HH RN for CHF management was completed yesterday in anticipation for discharge yesterday evening. Unfortunately, pt's O2 did not arrive until well into the evening.   Additionally, pt's Novolin 70/30 insulin was adjusted this morning due to high BGs throughout his admission. I called Diabetes Management who had made recs previously, and they converted insulin dosing while inpatient to 70/30 Novolin at home. He will go home with this new regimen.   He is advised to follow-up with his PCP and monitor blood sugars closely. I called PCP in an attempt to schedule an appointment. He has not been seen since 2010, and there is a block on his account. He was advised to call the billing department to remove this hold. I will put in an order for [facility] SW in an effort to establish care outpatient in the interim, while financial obstacles are addressed by the patient.   Jacqulyn Bath, PA-C 06/14/2011 9:07 AM  We have consulted with the diabetes team and have made some adjustments to his insulin.  He should see his medical doctor for further recs.  Vesta Mixer, Montez Hageman., MD, [facility] 06/14/2011, 9:16 AM

## 2011-06-14 NOTE — Progress Notes (Signed)
CARDIAC REHAB PHASE I   PRE:  Rate/Rhythm: 84 SR    BP: sitting 95/55    SaO2: 95 3L   MODE:  Ambulation: 680 ft   POST:  Rate/Rhythm: 108 ST    BP: sitting 100/75     SaO2: 90 4L  No c/o ambulating on 4L, supervision. Reiterated wearing O2, daily wts, decreasing salt and smoking cessation. [id number]  Harriet Masson CES, ACSM

## 2011-06-14 NOTE — Discharge Summary (Signed)
Discharge Summary   Patient ID: James Moon,  MRN: [id number], DOB/AGE: January 21, 1946 65 y.o.  Admit date: 06/07/2011 Discharge date: 06/14/2011  Discharge Diagnoses Principal Problem:  *NSTEMI (non-ST elevated myocardial infarction) Active Problems:  CAD (coronary artery disease)  CHF (congestive heart failure)  IDDM (insulin dependent diabetes mellitus)  Tobacco abuse  COPD (chronic obstructive pulmonary disease)   Allergies No Known Allergies  Procedures:   2D echocardiogram without contrast Study Conclusions  - Left ventricle: The cavity size was mildly dilated. Wall thickness was normal. Systolic function was severely reduced. The estimated ejection fraction was in the range of 20% to 25%. There was an increased relative contribution of atrial contraction to ventricular filling. The deceleration time of the early transmitral flow velocity was decreased. Isovolumic relaxation time was decreased. Doppler parameters are consistent with both elevated ventricular end-diastolic filling pressure and elevated left atrial filling pressure. E/E' medial TDI approximately 30 - Regional wall motion abnormality: Akinesis and scarring of the mid anteroseptal, mid inferoseptal, apical inferior, apical septal, and apical myocardium; severe hypokinesis of the apical anterior, basal-mid inferior, mid anterolateral, and apical lateral myocardium; hypokinesis of the basal inferoseptal and mid inferolateral myocardium; moderate hypokinesis of the mid anterior myocardium. - Mitral valve: Calcified annulus. Mildly thickened leaflets . - Right ventricle: Unable to evaluate RVSP Systolic function was mildly reduced. - Atrial septum: No defect or patent foramen ovale was identified.  ------------------------------------------------------------ Labs, prior tests, procedures, and surgery: Coronary artery bypass grafting.  Transthoracic echocardiography. M-mode, complete  2D, spectral Doppler, and color Doppler. Height: Height: 177.8cm. Height: 70in. Weight: Weight: 74.6kg. Weight: 164.1lb. Body mass index: BMI: 23.6kg/m^2. Body surface area: BSA: 1.45m^2. Blood pressure: 99/57. Patient status: Inpatient.  ------------------------------------------------------------  ------------------------------------------------------------ Left ventricle: The cavity size was mildly dilated. Wall thickness was normal. Systolic function was severely reduced. The estimated ejection fraction was in the range of 20% to 25%. Regional wall motion abnormalities: Akinesis and scarring of the mid anteroseptal, mid inferoseptal, apical inferior, apical septal, and apical myocardium; severe hypokinesis of the apical anterior, basal-mid inferior, mid anterolateral, and apical lateral myocardium; hypokinesis of the basal inferoseptal and mid inferolateral myocardium; moderate hypokinesis of the mid anterior myocardium. There was an increased relative contribution of atrial contraction to ventricular filling. The deceleration time of the early transmitral flow velocity was decreased. Isovolumic relaxation time was decreased. The tissue Doppler parameters were abnormal. Abnormal relaxation with increased filling pressures. Doppler parameters are consistent with both elevated ventricular end-diastolic filling pressure and elevated left atrial filling pressure. E/E' medial TDI approximately 30  ------------------------------------------------------------ Aortic valve: Mildly thickened, mildly calcified leaflets. Doppler: No significant regurgitation.  ------------------------------------------------------------ Aorta: The aorta was poorly visualized, normal, not dilated, and non-diseased.  ------------------------------------------------------------ Mitral valve: Calcified annulus. Mildly thickened leaflets . Doppler: No significant regurgitation. Peak gradient: 8mm Hg  (D).  ------------------------------------------------------------ Left atrium: The atrium was at the upper limits of normal in size.  ------------------------------------------------------------ Atrial septum: No defect or patent foramen ovale was identified.  ------------------------------------------------------------ Right ventricle: Unable to evaluate RVSP The cavity size was normal. Systolic function was mildly reduced.  ------------------------------------------------------------ Right atrium: The atrium was normal in size.  ------------------------------------------------------------ Pericardium: There was no pericardial effusion.  ------------------------------------------------------------ Systemic veins: Inferior vena cava: The vessel was normal in size; the respirophasic diameter changes were in the normal range (= 50%); findings are consistent with normal central venous pressure.  ------------------------------------------------------------ Post procedure conclusions Ascending Aorta:  - The aorta was poorly visualized, normal, not dilated, and non-diseased.  Cardiac Catheterization  Lesion Data:  Vessel #1: Saphenous vein graft to the first and second obtuse marginal branches.  Percent stenosis (pre): 90%  TIMI-flow (pre): 3  Stent: 3.5 x 38 mm Promus element plus  Percent stenosis (post): 0%  TIMI-flow (post): 3  Vessel #2: Saphenous vein graft to the PDA.  Percent stenosis (pre): 95%  TIMI flow (pre): 3  Stent: 3.0 x 20 mm Promus element  Percent stenosis (post): 0%  TIMI flow (post): 3.  Conclusions:  1. Successful stenting of the saphenous vein graft to the first and second obtuse marginal branches with a drug-eluting stent.  2. Successful stenting of the saphenous vein graft to the PDA with a drug-eluting stent.  History of Present Illness: Mr. Cromwell is a 65 yo with PMHx significant for CAD (CABG x 4 in 1996 utilizing SVGs harvested from R  leg), IDDM, HTN, tobacco abuse (50 pack-year hx) and colon cancer (s/p colectomy) admitted to [facility] hospital with NSTEMI and acute CHF.   He reported a 14-month history of worsening SOB accompanied by lightheadedness, weakness and occasional palpitations experienced after very mild exertion (bending over to tie shoes, walking from one end of the house to the other) with associated worsening ankle swelling, orthopnea, PND and nocturia. He denied chest pain, n/v, abdominal pain.  He has not followed a cardiologist "in years" and denies any cardiac work-up since CABG. He used to follow a PA at [facility], but has not seen her in "a couple years" due to recent unemployment and lack of health insurance.  On 12/07, he awoke, went to the bathroom and became lightheaded upon bending over. He became weak, short of breath and stumbled to his knees, but did not lose consciousness. 911 was called and he was transported to [facility] ED.   In the ED, EKG revealed questionable lateral ischemia, CK-MB and TnI positive at 21.9 and 0.39, respectively, CXR revealed interstitial pulmonary edema and small bilateral pleural effusions, no cardiomegaly; of note, potassium, H/H mildly elevated; blood gases: pH 7.21, pCO2 89.3.   Hospital Course   He was subsequently admitted for NSTEMI with acute CHF. Due to lack of medical follow-up, chronic conditions were managed prior to any cardiac intervention. He was diuresed with Lasix and started on aggressive pulmonary nebs. He was found to be retained CO2 and oxygenation was carefully adjusted.   He diuresed well and breathing improved. 2D echocardiogram revealed the above details, most notably, LVEF 20-25%, akinesis and scarring of the mid anteroseptal, mid inferoseptal, apical inferior, apical septal, and apical myocardium; severe hypokinesis of the apical anterior, basal-mid inferior, mid anterolateral, and apical lateral myocardium; hypokinesis of the basal inferoseptal  and mid inferolateral myocardium; moderate hypokinesis of the mid anterior myocardium and severely reduced systolic dysfunction with mildly dilated LV, wall thickness normal. ACEI was subsequently added.   He underwent cardiac catheterization on 12/10 revealing the above details, most notably including successful stenting of the saphenous vein graft to the first and second obtuse marginal branches with a drug-eluting stent and successful stenting of the saphenous vein graft to the PDA with a drug-eluting stent.   He tolerated the procedure well and his symptoms improved. His BGs were poorly controlled after cath, and insulin was adjusted with resolution. O2 sats remained normal at rest; however, on ambulation decreased to mid 80s with 4L of O2, although asymptomatic. PFTs revealed longstanding obstructive disease. Pulmonary consult recommended COPD management with nebs outpatient.   He will be discharged on ASA, Plavix, BB, Statin, Nitroglycerin  in addition to COPD nebs and low-dose Lasix PO. He has scheduled appointments with [facility] and Pulmonology. He will need close follow-up with PCP for adequate diabetes management. It was emphasized how imperative smoking cessation would be to his lung function and overall quality of health.   Discharge Vitals:  Blood pressure 95/55, pulse 79, temperature 97.5 F (36.4 C), temperature source Oral, resp. rate 18, height 5\' 10"  (1.778 m), weight 68.5 kg (151 lb 0.2 oz), SpO2 98.00%.   Labs: Recent Labs  Basename 06/14/11 0424 06/13/11 0405   WBC 6.9 6.5   HGB 16.4 16.6   HCT 51.7 51.5   MCV 96.6 96.3   PLT 123* 124*   No results found for this basename: VITAMINB12,FOLATE,FERRITIN,TIBC,IRON,RETICCTPCT in the last 72 hours No results found for this basename: DDIMER:2 in the last 72 hours  Lab 06/12/11 0610 06/11/11 0500 06/10/11 0710 06/07/11 1146  NA 137 136 136 --  K 4.6 4.7 4.8 --  CL 90* 90* 91* --  CO2 35* 36* 39* --  BUN 25* 28* 27* --   CREATININE 0.97 0.97 1.00 --  CALCIUM 9.6 8.9 9.3 --  PROT -- -- -- 7.1  BILITOT -- -- -- 0.2*  ALKPHOS -- -- -- 77  ALT -- -- -- 43  AST -- -- -- 53*  AMYLASE -- -- -- --  LIPASE -- -- -- --  GLUCOSE 260* 305* 376* --   Disposition: stable, in good condition  Discharge Orders    Future Appointments: Provider: Department: Dept Phone: Center:   06/26/2011 9:30 AM Sandrea Hughs, MD [facility] [phone] None   07/01/2011 8:30 AM Rosalio Macadamia, NP [facility] [phone] None     Future Orders Please Complete By Expires   Ambulatory referral to Home Health      Comments:   Please evaluate LAVAUGHN HABERLE for admission to [facility].  Disciplines requested: Medical Social Work  Services to provide: Other: to help patient establish care after this admission. He has insurance/financial obstacles preventing him from following up with PCP.   Physician to follow patient's care (the person listed here will be responsible for signing ongoing orders): Other: follow-up provider established by SW  Requested Start of Care Date: Tomorrow  Special Instructions:  Pt has many chronic issues that must be managed on an outpatient basis. It is imperative that he establish care soon (1-2 weeks) after discharge and be following regularly. This has been discussed with the patient. He agrees.   Ambulatory referral to Home Health      Comments:   Please evaluate DEONTAY LADNIER for admission to [facility].  Disciplines requested: Nursing and Home Health Aide  Services to provide: Oxygen Via Nasal Cannula at 3 liters/minute at rest, 4 liters/minute on exertion  Physician to follow patient's care (the person listed here will be responsible for signing ongoing orders): PCP  Requested Start of Care Date: Tomorrow for RN, today prior to discharge for oxygen  Special Instructions:  [facility] RN needed for CHF management, HH aide for oxygen outpatient     Follow-up Information    Follow  up with Sandrea Hughs, MD on 06/26/2011. (At 9:30AM for COPD management. )    Contact information:   [address] [facility] [address] [place] [address] [place] [address] [phone]       Follow up with Norma Fredrickson, NP on 07/01/2011. (At 8:30AM for recent hospitalization follow-up. You will see Norma Fredrickson for Dr. Elease Hashimoto (  cardiology).)    Contact information:   [address] [place] [address] [phone]          Discharge Medications:  Current Discharge Medication List    START taking these medications   Details  albuterol (PROVENTIL) (5 MG/ML) 0.5% nebulizer solution Take 0.5 mLs (2.5 mg total) by nebulization every 4 (four) hours as needed for wheezing or shortness of breath. Qty: 20 mL, Refills: 3    carvedilol (COREG) 3.125 MG tablet Take 1 tablet (3.125 mg total) by mouth 2 (two) times daily with a meal. Qty: 60 tablet, Refills: 3    clopidogrel (PLAVIX) 75 MG tablet Take 1 tablet (75 mg total) by mouth daily with breakfast. Qty: 30 tablet, Refills: 3    Fluticasone-Salmeterol (ADVAIR) 500-50 MCG/DOSE AEPB Inhale 1 puff into the lungs 2 (two) times daily. Qty: 60 each, Refills: 3    furosemide (LASIX) 40 MG tablet Take 1 tablet (40 mg total) by mouth daily. Qty: 30 tablet, Refills: 3    lisinopril (PRINIVIL,ZESTRIL) 5 MG tablet Take 1 tablet (5 mg total) by mouth daily. Qty: 30 tablet, Refills: 3    nitroGLYCERIN (NITROSTAT) 0.4 MG SL tablet Place 1 tablet (0.4 mg total) under the tongue every 5 (five) minutes as needed for chest pain. Qty: 25 tablet, Refills: 3    rosuvastatin (CRESTOR) 20 MG tablet Take 1 tablet (20 mg total) by mouth daily at 6 PM. Qty: 30 tablet, Refills: 3    tiotropium (SPIRIVA) 18 MCG inhalation capsule Place 1 capsule (18 mcg total) into inhaler and inhale daily. Qty: 30 capsule, Refills: 3      CONTINUE these medications which have NOT CHANGED   Details  aspirin EC 81 MG tablet Take 81 mg by  mouth daily.      insulin NPH-insulin regular (NOVOLIN 70/30) (70-30) 100 UNIT/ML injection Inject 15-25 Units into the skin 2 (two) times daily with a meal. 15 units in the morning and 25 units in the evening.         Outstanding Labs/Studies: None  Duration of Discharge Encounter: Greater than 30 minutes including physician time.  Signed, R. Hurman Horn, PA-C 06/14/2011, 9:22 AM  Attending Note:   The patient was seen and examined.  Agree with assessment and plan as noted above.  See my note from today  Alvia Grove., MD, [facility] 06/14/2011, 9:22 AM

## 2011-06-18 ENCOUNTER — Telehealth: Payer: Self-pay | Admitting: *Deleted

## 2011-06-18 MED ORDER — ATORVASTATIN CALCIUM 40 MG PO TABS: 40.0000 mg | ORAL_TABLET | Freq: Every day | ORAL | Status: DC

## 2011-06-18 NOTE — Telephone Encounter (Signed)
[facility] from [facility]/ nurse stephanie edwards rn asking for diff med to replace crestor as the cost is to much for pt. I called pt after dr Elease Hashimoto reviewed and new rx sent in, pt aware to start taking and reviewed importance of reducing cholesterol. Called [facility] nurse to update.

## 2011-06-21 ENCOUNTER — Telehealth: Payer: Self-pay | Admitting: Internal Medicine

## 2011-06-21 DIAGNOSIS — J449 Chronic obstructive pulmonary disease, unspecified: Secondary | ICD-10-CM

## 2011-06-21 NOTE — Telephone Encounter (Signed)
Order placed and faxed to [facility]. Carron Curie, CMA

## 2011-06-26 ENCOUNTER — Ambulatory Visit (INDEPENDENT_AMBULATORY_CARE_PROVIDER_SITE_OTHER): Payer: Medicare Other | Admitting: Internal Medicine

## 2011-06-26 ENCOUNTER — Other Ambulatory Visit (INDEPENDENT_AMBULATORY_CARE_PROVIDER_SITE_OTHER): Payer: Medicare Other

## 2011-06-26 ENCOUNTER — Telehealth: Payer: Self-pay | Admitting: Nurse Practitioner

## 2011-06-26 ENCOUNTER — Encounter: Payer: Self-pay | Admitting: Internal Medicine

## 2011-06-26 VITALS — BP 112/68 | HR 95 | Temp 97.4°F | Ht 70.0 in | Wt 158.8 lb

## 2011-06-26 DIAGNOSIS — R0989 Other specified symptoms and signs involving the circulatory and respiratory systems: Secondary | ICD-10-CM

## 2011-06-26 DIAGNOSIS — I509 Heart failure, unspecified: Secondary | ICD-10-CM

## 2011-06-26 DIAGNOSIS — R0600 Dyspnea, unspecified: Secondary | ICD-10-CM | POA: Insufficient documentation

## 2011-06-26 DIAGNOSIS — R0609 Other forms of dyspnea: Secondary | ICD-10-CM

## 2011-06-26 DIAGNOSIS — J449 Chronic obstructive pulmonary disease, unspecified: Secondary | ICD-10-CM

## 2011-06-26 LAB — BASIC METABOLIC PANEL
BUN: 37 mg/dL — ABNORMAL HIGH (ref 6–23)
CO2: 32 mEq/L (ref 19–32)
Calcium: 9.5 mg/dL (ref 8.4–10.5)
Creatinine, Ser: 1.2 mg/dL (ref 0.4–1.5)
Glucose, Bld: 397 mg/dL — ABNORMAL HIGH (ref 70–99)

## 2011-06-26 LAB — CBC WITH DIFFERENTIAL/PLATELET
Basophils Absolute: 0 10*3/uL (ref 0.0–0.1)
Eosinophils Absolute: 0.3 10*3/uL (ref 0.0–0.7)
Lymphocytes Relative: 13.2 % (ref 12.0–46.0)
MCHC: 32.5 g/dL (ref 30.0–36.0)
MCV: 94.8 fl (ref 78.0–100.0)
Monocytes Absolute: 0.5 10*3/uL (ref 0.1–1.0)
Neutrophils Relative %: 74.9 % (ref 43.0–77.0)
Platelets: 174 10*3/uL (ref 150.0–400.0)
RDW: 14.4 % (ref 11.5–14.6)

## 2011-06-26 NOTE — Progress Notes (Signed)
Subjective:     Patient ID: James Moon, male   DOB: 1945-10-21, 65 y.o.   MRN: [id number]  HPI 69 yowm quit smoking at admit to [facility] 12/7 with chf/ihd/copd dx by pft's at FEV1 35%  06/26/2011 hosp  f/u ov/James Moon cc no sob on 02 24h per day, not smoking, resuming nl desired activities with no limitations so far. No cough   Sleeping ok without nocturnal  or early am exacerbation  of respiratory  c/o's or need for noct saba. Also denies any obvious fluctuation of symptoms with weather or environmental changes or other aggravating or alleviating factors except as outlined above   ROS  At present neg for  any significant sore throat, dysphagia, itching, sneezing,  nasal congestion or excess/ purulent secretions,  fever, chills, sweats, unintended wt loss, pleuritic or exertional cp, hempoptysis, orthopnea pnd or leg swelling.  Also denies presyncope, palpitations, heartburn, abdominal pain, nausea, vomiting, diarrhea  or change in bowel or urinary habits, dysuria,hematuria,  rash, arthralgias, visual complaints, headache, numbness weakness or ataxia.     Review of Systems     Objective:   Physical Exam    pleasant amb wm nad Wt  06/26/2011  158  HEENT mild turbinate edema.  Oropharynx no thrush or excess pnd or cobblestoning.  No JVD or cervical adenopathy. Mild accessory muscle hypertrophy. Trachea midline, nl thryroid. Chest was hyperinflated by percussion with diminished breath sounds and moderate increased exp time without wheeze. Hoover sign positive at end  inspiration. Regular rate and rhythm without murmur gallop or rub or increase P2 or edema.  Abd: no hsm, nl excursion. Ext warm without cyanosis or clubbing.    CXR  06/26/2011 : ordered   Assessment:          Plan:

## 2011-06-26 NOTE — Assessment & Plan Note (Signed)
BNP 1498 > 149 and clinically now well compensated albeit on coreg and ace inhibitors which can (but aren't) prove problematic in pts with airways disorders so no change in rx needed

## 2011-06-26 NOTE — Assessment & Plan Note (Signed)
-   PFT's 06/12/11 FEV1  1.20 (35%) ratio is 50   DLC0  110%   - 06/26/2011  Walked RA x 3 laps @ 185 ft each stopped due to  End of study, sats 88%   DDX of  difficult airways managment all start with A and  include Adherence, Ace Inhibitors, Acid Reflux, Active Sinus Disease, Alpha 1 Antitripsin deficiency, Anxiety masquerading as Airways dz,  ABPA,  allergy(esp in young), Aspiration (esp in elderly), Adverse effects of DPI,  Active smokers, plus two Bs  = Bronchiectasis and Beta blocker use..and one C= CHF    GOLD III marked improvement since  Active smoking and chf addressed > try taper advair down if not off   No longer appears 02 dep so use it prn until next ov then regroup

## 2011-06-26 NOTE — Patient Instructions (Signed)
Please remember to go to the lab and x-ray department downstairs for your tests - we will call you with the results when they are available.    Ok to use the 02  And the nebulizer as needed for now  When your advair runs out, change to Advair 250 twice until your visit  Please schedule a follow up office visit in 4 weeks, sooner if needed with all medications in hand

## 2011-06-26 NOTE — Telephone Encounter (Signed)
Patient dropped off FMLA form from [employer] requesting immediate completion. Explained processing time (7-10 days) and the need for office visit to be completed 12/31 with Norma Fredrickson. Patient paid fee and if needed may request a note to present to employer after his follow-up visit. Form forwarded to [employer].

## 2011-06-28 ENCOUNTER — Encounter: Payer: Self-pay | Admitting: *Deleted

## 2011-06-28 ENCOUNTER — Telehealth: Payer: Self-pay | Admitting: *Deleted

## 2011-06-28 NOTE — Telephone Encounter (Signed)
Forwarded to [employer] np,

## 2011-06-28 NOTE — Telephone Encounter (Signed)
Message copied by Antony Odea on Fri Jun 28, 2011  1:57 PM ------      Message from: Bary Leriche      Created: Fri Jun 28, 2011  8:35 AM       Good Morning,   I'm sending a FMLA to Shawn Route NP for Dr. Harvie Bridge patient Chanetta Marshall D. Speckman dob 2046/04/10.  He has an appointment on Monday the 31st at 8:30am, with Ms. Lowella Fairy.            Thank you,  Elease Hashimoto @ [employer]

## 2011-06-28 NOTE — Progress Notes (Signed)
Quick Note:  Line still busy, will send letter for pt to call for results. ______

## 2011-07-01 ENCOUNTER — Ambulatory Visit (INDEPENDENT_AMBULATORY_CARE_PROVIDER_SITE_OTHER): Payer: Medicare Other | Admitting: Nurse Practitioner

## 2011-07-01 ENCOUNTER — Encounter: Payer: Self-pay | Admitting: Nurse Practitioner

## 2011-07-01 VITALS — BP 100/62 | HR 60 | Ht 70.0 in | Wt 158.6 lb

## 2011-07-01 DIAGNOSIS — I214 Non-ST elevation (NSTEMI) myocardial infarction: Secondary | ICD-10-CM

## 2011-07-01 DIAGNOSIS — I509 Heart failure, unspecified: Secondary | ICD-10-CM

## 2011-07-01 DIAGNOSIS — I5020 Unspecified systolic (congestive) heart failure: Secondary | ICD-10-CM

## 2011-07-01 DIAGNOSIS — Z720 Tobacco use: Secondary | ICD-10-CM

## 2011-07-01 DIAGNOSIS — F17200 Nicotine dependence, unspecified, uncomplicated: Secondary | ICD-10-CM

## 2011-07-01 LAB — BASIC METABOLIC PANEL
BUN: 31 mg/dL — ABNORMAL HIGH (ref 6–23)
CO2: 32 mEq/L (ref 19–32)
Calcium: 9.6 mg/dL (ref 8.4–10.5)
Chloride: 94 mEq/L — ABNORMAL LOW (ref 96–112)
Creatinine, Ser: 1.1 mg/dL (ref 0.4–1.5)
GFR: 70.54 mL/min (ref >60.00)
Glucose, Bld: 172 mg/dL — ABNORMAL HIGH (ref 70–99)
Potassium: 4.9 mEq/L (ref 3.5–5.1)
Sodium: 136 mEq/L (ref 135–145)

## 2011-07-01 MED ORDER — LISINOPRIL 5 MG PO TABS: 5.0000 mg | ORAL_TABLET | Freq: Two times a day (BID) | ORAL | Status: DC

## 2011-07-01 NOTE — Patient Instructions (Signed)
Lets increase the Lisinopril to 5 mg two times a day.  You may return to work.  I would like to see you in 2 weeks.  We are going to check your labs today and again when you come back.  Weigh yourself each morning and record. Take extra dose of diuretic for weight gain of 3 pounds in 24 hours.  Call the office if you have any concerns. Limit sodium intake. Goal is to have less than 2000 mg (2gm) of salt per day.  Call the [facility] office at [phone] if you have any questions, problems or concerns.

## 2011-07-01 NOTE — Assessment & Plan Note (Signed)
He has had recent NSTEMI with DES to the SVG to the 1st and 2nd OM and SVG to the PD. He is on Plavix. He is doing well. I will let him go back to work on January 4th. Patient is agreeable to this plan and will call if any problems develop in the interim.

## 2011-07-01 NOTE — Assessment & Plan Note (Signed)
EF is low. We will need to up titrate his medicines. He will need repeat echo 3 mos post MI. I have increased his Lisinopril to 5 mg BID. We will check a BMET today and on return. I will see him in 2 weeks. He is weighing each morning and trying to watch his salt.

## 2011-07-01 NOTE — Progress Notes (Signed)
James Moon Date of Birth: Jun 01, 1946 Medical Record #[id number]  History of Present Illness: James Moon is seen today for a follow up visit. He is seen for Dr. Elease Hashimoto. He has had a recent NSTEMI with DES to the SVG to the 1st and 2nd OM and the SVG to the PD. His EF is 20%. He is on low dose ACE and beta blocker along with Plavix and diuretic.  He comes in today. He is doing very well. He has no complaint. Says his breathing is greatly improved. No shortness of breath. Off of oxygen per Dr. Sherene Sires. Has also stopped smoking. No chest pain. No dizzy or lightheaded. He is anxious to return to work. Says he cannot afford his medicines if he does not return. 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   Current Outpatient Prescriptions on File Prior to Visit  Medication Sig Dispense Refill  . aspirin EC 81 MG tablet Take 81 mg by mouth daily.        Marland Kitchen atorvastatin (LIPITOR) 40 MG tablet Take 1 tablet (40 mg total) by mouth daily.  30 tablet  11  . carvedilol (COREG) 3.125 MG tablet Take 1 tablet (3.125 mg total) by mouth 2 (two) times daily with a meal.  60 tablet  3  . clopidogrel (PLAVIX) 75 MG tablet Take 1 tablet (75 mg total) by mouth daily with breakfast.  30 tablet  3  . Fluticasone-Salmeterol (ADVAIR) 500-50 MCG/DOSE AEPB Inhale 1 puff into the lungs 2 (two) times daily.  60 each  3  . furosemide (LASIX) 40 MG tablet Take 1 tablet (40 mg total) by mouth daily.  30 tablet  3  . ibuprofen (ADVIL,MOTRIN) 200 MG tablet Take 200 mg by mouth every 6 (six) hours as needed.        . insulin NPH-insulin regular (NOVOLIN 70/30) (70-30) 100 UNIT/ML injection Inject 25 Units into the skin 2 (two) times daily with a meal.  10 mL  12  . lisinopril (PRINIVIL,ZESTRIL) 5 MG tablet Take 1 tablet (5 mg total) by mouth daily.  30 tablet  3  . nitroGLYCERIN (NITROSTAT) 0.4 MG SL tablet Place 1 tablet (0.4 mg total) under the tongue every 5 (five) minutes as needed for chest pain.  25  tablet  3  . tiotropium (SPIRIVA) 18 MCG inhalation capsule Place 1 capsule (18 mcg total) into inhaler and inhale daily.  30 capsule  3    No Known Allergies  Past Medical History  Diagnosis Date  . Diabetes mellitus   . Colon cancer     s/p colectomy   . Hypertension   . CAD (coronary artery disease)     CABG in 1996  . NSTEMI (non-ST elevated myocardial infarction) Dec. 2012    DES x 2 to SVG to 1st and 2nd OM and SVG to PDA  . Left ventricular dysfunction Dec. 2012    EF is 20 to 25%  . Tobacco abuse   . COPD (chronic obstructive pulmonary disease)     Past Surgical History  Procedure Date  . Colon surgery   . Open heart surgery   . Coronary stent placement Dec. 2012    DES to SVG to 1st and 2nd OM and SVG to the PDA    History  Smoking status  . Former Smoker -- 1.0 packs/day for 50 years  . Types: Cigarettes  . Quit date: 06/07/2011  Smokeless tobacco  . Never Used  History  Alcohol Use No    Family History  Problem Relation Age of Onset  . Heart attack Father   . Diabetes Father   . Breast cancer Mother     Review of Systems: The review of systems is per the HPI.  No CHF symptoms. Weights are stable at home. No longer SOB. Off oxygen. No problems with his groin.  All other systems were reviewed and are negative.  Physical Exam: BP 100/62  Pulse 60  Ht 5\' 10"  (1.778 m)  Wt 158 lb 9.6 oz (71.94 kg)  BMI 22.76 kg/m2 Patient is very pleasant and in no acute distress. Skin is warm andvery dry. Color is normal.  HEENT is unremarkable. Normocephalic/atraumatic. PERRL. Sclera are nonicteric. Neck is supple. No masses. No JVD. Lungs are clear. Cardiac exam shows a regular rate and rhythm. No S3 noted. Abdomen is soft. Extremities are without edema. Gait and ROM are intact. No gross neurologic deficits noted.   LABORATORY DATA: BMET is pending today.  Assessment / Plan:

## 2011-07-01 NOTE — Assessment & Plan Note (Signed)
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

## 2011-07-03 ENCOUNTER — Telehealth: Payer: Self-pay | Admitting: Internal Medicine

## 2011-07-03 NOTE — Telephone Encounter (Signed)
Notes Recorded by Sandrea Hughs, MD on 06/26/2011 at 3:21 PM Call patient : Studies are unremarkable, no change in recs though blood sugar is running higher and will need close f/u [id number]

## 2011-07-04 NOTE — Telephone Encounter (Signed)
I spoke with patient about results and he verbalized understanding and had no questions 

## 2011-07-15 ENCOUNTER — Encounter: Payer: Self-pay | Admitting: Nurse Practitioner

## 2011-07-15 ENCOUNTER — Ambulatory Visit (INDEPENDENT_AMBULATORY_CARE_PROVIDER_SITE_OTHER): Payer: Medicare Other | Admitting: Nurse Practitioner

## 2011-07-15 VITALS — BP 100/60 | HR 72 | Ht 70.5 in | Wt 158.8 lb

## 2011-07-15 DIAGNOSIS — F17200 Nicotine dependence, unspecified, uncomplicated: Secondary | ICD-10-CM

## 2011-07-15 DIAGNOSIS — Z720 Tobacco use: Secondary | ICD-10-CM

## 2011-07-15 DIAGNOSIS — I2510 Atherosclerotic heart disease of native coronary artery without angina pectoris: Secondary | ICD-10-CM

## 2011-07-15 DIAGNOSIS — I509 Heart failure, unspecified: Secondary | ICD-10-CM

## 2011-07-15 DIAGNOSIS — R0600 Dyspnea, unspecified: Secondary | ICD-10-CM

## 2011-07-15 DIAGNOSIS — R0609 Other forms of dyspnea: Secondary | ICD-10-CM

## 2011-07-15 MED ORDER — FUROSEMIDE 40 MG PO TABS: 40.0000 mg | ORAL_TABLET | Freq: Every day | ORAL | Status: DC | PRN

## 2011-07-15 NOTE — Assessment & Plan Note (Signed)
Cessation is encouraged

## 2011-07-15 NOTE — Assessment & Plan Note (Signed)
EF is 20%. He is on low dose ACE and beta blocker. BP and symptoms of dizziness will limit up titration of his medicines.

## 2011-07-15 NOTE — Progress Notes (Signed)
James Moon Date of Birth: 10-25-1945 Medical Record #[id number]  History of Present Illness: James Moon is seen back today for his 2 week check. He is seen for Dr. Elease Hashimoto. He has had recent NSTEMI with DES x 2 to the SVG to the 1st and 2nd OM and the SVG to the PD. EF is 20%. He has returned to work. We are trying to titrate his medicines up to optimal dosages with plans for repeat echo. Lisinopril was increased at his last visit to BID. He can only afford generic medicines, with $4 being the most idea. He is runs out of his coreg and Plavix today until Wednesday when he gets paid.   He comes in today. He is doing well. No chest pain. He is back smoking a few cigarettes. He is not short of breath. No cough. No swelling. He is back at work and doing well. Only complaint is being dizzy and lightheaded. He has checked some blood pressures at work. Readings have been low - 75/55 and 85/60.   Current Outpatient Prescriptions on File Prior to Visit  Medication Sig Dispense Refill  . aspirin EC 81 MG tablet Take 81 mg by mouth daily.        Marland Kitchen atorvastatin (LIPITOR) 40 MG tablet Take 1 tablet (40 mg total) by mouth daily.  30 tablet  11  . carvedilol (COREG) 3.125 MG tablet Take 1 tablet (3.125 mg total) by mouth 2 (two) times daily with a meal.  60 tablet  3  . clopidogrel (PLAVIX) 75 MG tablet Take 1 tablet (75 mg total) by mouth daily with breakfast.  30 tablet  3  . Fluticasone-Salmeterol (ADVAIR) 500-50 MCG/DOSE AEPB Inhale 1 puff into the lungs 2 (two) times daily.  60 each  3  . furosemide (LASIX) 40 MG tablet Take 1 tablet (40 mg total) by mouth daily.  30 tablet  3  . ibuprofen (ADVIL,MOTRIN) 200 MG tablet Take 200 mg by mouth every 6 (six) hours as needed.        . insulin NPH-insulin regular (NOVOLIN 70/30) (70-30) 100 UNIT/ML injection Inject 25 Units into the skin 2 (two) times daily with a meal.  10 mL  12  . lisinopril (PRINIVIL,ZESTRIL) 5 MG tablet Take 1 tablet (5 mg total) by  mouth 2 (two) times daily.  60 tablet  3  . nitroGLYCERIN (NITROSTAT) 0.4 MG SL tablet Place 1 tablet (0.4 mg total) under the tongue every 5 (five) minutes as needed for chest pain.  25 tablet  3  . tiotropium (SPIRIVA) 18 MCG inhalation capsule Place 1 capsule (18 mcg total) into inhaler and inhale daily.  30 capsule  3    No Known Allergies  Past Medical History  Diagnosis Date  . Diabetes mellitus   . Colon cancer     s/p colectomy   . Hypertension   . CAD (coronary artery disease)     CABG in 1996  . NSTEMI (non-ST elevated myocardial infarction) Dec. 2012    DES x 2 to SVG to 1st and 2nd OM and SVG to PDA  . Left ventricular dysfunction Dec. 2012    EF is 20 to 25%  . Tobacco abuse   . COPD (chronic obstructive pulmonary disease)     Past Surgical History  Procedure Date  . Colon surgery   . Open heart surgery   . Coronary stent placement Dec. 2012    DES to SVG to 1st and 2nd OM and SVG  to the PDA    History  Smoking status  . Current Some Day Smoker -- 1.0 packs/day for 50 years  . Types: Cigarettes  . Last Attempt to Quit: 06/07/2011  Smokeless tobacco  . Never Used    History  Alcohol Use No    Family History  Problem Relation Age of Onset  . Heart attack Father   . Diabetes Father   . Breast cancer Mother     Review of Systems: The review of systems is per the HPI.  All other systems were reviewed and are negative.  Physical Exam: BP 98/68  Pulse 72  Ht 5' 10.5" (1.791 m)  Wt 158 lb 12.8 oz (72.031 kg)  BMI 22.46 kg/m2 Patient is very pleasant and in no acute distress. Skin is warm and dry. Color is normal.  HEENT is unremarkable. Normocephalic/atraumatic. PERRL. Sclera are nonicteric. Neck is supple. No masses. No JVD. Lungs show decreased breath sounds. Cardiac exam shows a regular rate and rhythm. Heart tones are distant. Abdomen is soft. Extremities are without edema. Gait and ROM are intact. No gross neurologic deficits  noted.   LABORATORY DATA:   Assessment / Plan:

## 2011-07-15 NOTE — Assessment & Plan Note (Signed)
Currently denies any shortness of breath. Smoking cessation is encouraged.

## 2011-07-15 NOTE — Patient Instructions (Addendum)
Use the Lasix (furosemide) only as needed for weight gain, swelling, etc.  Weigh yourself each morning and record. Take your dose of diuretic for weight gain of 3 pounds in 24 hours.   Limit sodium intake. Goal is to have less than 2000 mg (2gm) of salt per day.   Stay on your other medicines.  Check some blood pressures for me while you are at work.   We will see you in 3 weeks.  Call the [facility] office at [phone] if you have any questions, problems or concerns.   No Smoking!

## 2011-07-15 NOTE — Assessment & Plan Note (Addendum)
He has had recent NSTEMI with DES to the SVG to the 1st and 2nd OM and SVG to the PD. EF is 20%. He is on low dose ACE and beta blocker. He remains dizzy. I do not think he is going to be able to tolerate further titration of his medicines. We will change the Lasix to just prn. He is encouraged to not smoke. I will see him again in 3 weeks. I gave him samples of his Plavix. Patient is agreeable to this plan and will call if any problems develop in the interim.

## 2011-07-16 ENCOUNTER — Telehealth: Payer: Self-pay | Admitting: Cardiovascular Disease

## 2011-07-16 NOTE — Telephone Encounter (Signed)
New Problem   [facility] [phone] ext  [phone], wants to verify Dr. Elease Hashimoto received statement of medical necessity from blood glucose test strips and lancets.  Please return call for confirmation or refax.

## 2011-07-16 NOTE — Telephone Encounter (Signed)
NEED TO SEND GLUCOSE ITEMS TO PCP. [facility] INFORMED AND I GAVE PCP NAME/NUMBER

## 2011-07-24 ENCOUNTER — Encounter: Payer: Self-pay | Admitting: Internal Medicine

## 2011-07-24 ENCOUNTER — Ambulatory Visit (INDEPENDENT_AMBULATORY_CARE_PROVIDER_SITE_OTHER): Payer: Medicare Other | Admitting: Internal Medicine

## 2011-07-24 VITALS — BP 108/60 | HR 80 | Temp 97.6°F | Ht 70.0 in | Wt 161.2 lb

## 2011-07-24 DIAGNOSIS — J449 Chronic obstructive pulmonary disease, unspecified: Secondary | ICD-10-CM

## 2011-07-24 NOTE — Assessment & Plan Note (Signed)
-   PFT's 06/12/11 FEV1  1.20 (35%) ratio is 50   DLC0  110%   - 06/26/2011  Walked RA x 3 laps @ 185 ft each stopped due to  End of study, sats 88%  His symptoms don't match his pft's which need to be repeated now that he's so much better.  I reviewed the Flethcher curve with patient that basically indicates  if you quit smoking when your best day FEV1 is still well preserved (which his appears to be, either that or he's much more sedentary)  it is highly unlikely you will progress to severe disease and informed the patient there was no medication on the market that has proven to change the curve or the likelihood of progression.  Therefore stopping smoking and maintaining abstinence is the most important aspect of care, not choice of inhalers or for that matter, doctors.

## 2011-07-24 NOTE — Progress Notes (Signed)
Subjective:     Patient ID: James Moon, male   DOB: 11-07-1945    MRN: [id number]  HPI 89 yowm quit smoking at admit to [facility] 12/7 with chf/ihd/copd dx by pft's at [facility] 35% 06/2011  06/26/2011 hosp  f/u ov/Raesean Bartoletti cc no sob on 02 24h per day, not smoking, resuming nl desired activities with no limitations so far. No cough rec Please remember to go to the lab and x-ray department downstairs for your tests - we will call you with the results when they are available.    Ok to use the 02  And the nebulizer as needed for now  When your advair runs out, change to Advair 250 twice daily  until your visit  Please schedule a follow up office visit in 4 weeks, sooner if needed with all medications in hand     07/24/2011 f/u ov/Alaine Loughney still smoking 4 per day cc breathing better than last visit. Denies chest pain, chest tightness, cough.  Exercising on treadmill  1.5 level x 10-15 h No need for 02 or neb at this point   Sleeping ok without nocturnal  or early am exacerbation  of respiratory  c/o's or need for noct saba. Also denies any obvious fluctuation of symptoms with weather or environmental changes or other aggravating or alleviating factors except as outlined above   ROS  At present neg for  any significant sore throat, dysphagia, itching, sneezing,  nasal congestion or excess/ purulent secretions,  fever, chills, sweats, unintended wt loss, pleuritic or exertional cp, hempoptysis, orthopnea pnd or leg swelling.  Also denies presyncope, palpitations, heartburn, abdominal pain, nausea, vomiting, diarrhea  or change in bowel or urinary habits, dysuria,hematuria,  rash, arthralgias, visual complaints, headache, numbness weakness or ataxia.         Objective:   Physical Exam    pleasant amb wm nad Wt  06/26/2011  158 > 07/24/2011  161 HEENT mild turbinate edema.  Oropharynx no thrush or excess pnd or cobblestoning.  No JVD or cervical adenopathy. Mild accessory muscle hypertrophy. Trachea  midline, nl thryroid. Chest was hyperinflated by percussion with diminished breath sounds and moderate increased exp time without wheeze. Hoover sign positive at end  inspiration. Regular rate and rhythm without murmur gallop or rub or increase P2 or edema.  Abd: no hsm, nl excursion. Ext warm without cyanosis or clubbing.    CXR  06/26/2011 : Ordered but not done   Assessment:          Plan:

## 2011-07-24 NOTE — Patient Instructions (Addendum)
Please schedule a follow up visit in 3 months but call sooner if needed for PFT's   ADD NEVER WENT TO CXR > will call to remind but needs to done on return to f/u ? ILD)

## 2011-07-25 ENCOUNTER — Other Ambulatory Visit: Payer: Self-pay | Admitting: Internal Medicine

## 2011-07-25 DIAGNOSIS — J449 Chronic obstructive pulmonary disease, unspecified: Secondary | ICD-10-CM

## 2011-07-29 ENCOUNTER — Ambulatory Visit (INDEPENDENT_AMBULATORY_CARE_PROVIDER_SITE_OTHER)
Admission: RE | Admit: 2011-07-29 | Discharge: 2011-07-29 | Disposition: A | Discharge status: Home / Self Care | Payer: Medicare Other | Source: Ambulatory Visit | Attending: Internal Medicine | Admitting: Internal Medicine

## 2011-07-29 DIAGNOSIS — J449 Chronic obstructive pulmonary disease, unspecified: Secondary | ICD-10-CM

## 2011-08-05 ENCOUNTER — Ambulatory Visit (INDEPENDENT_AMBULATORY_CARE_PROVIDER_SITE_OTHER): Payer: Medicare Other | Admitting: Nurse Practitioner

## 2011-08-05 ENCOUNTER — Encounter: Payer: Self-pay | Admitting: Nurse Practitioner

## 2011-08-05 VITALS — BP 108/68 | HR 76 | Ht 70.0 in | Wt 161.2 lb

## 2011-08-05 DIAGNOSIS — Z720 Tobacco use: Secondary | ICD-10-CM

## 2011-08-05 DIAGNOSIS — I2510 Atherosclerotic heart disease of native coronary artery without angina pectoris: Secondary | ICD-10-CM

## 2011-08-05 DIAGNOSIS — I519 Heart disease, unspecified: Secondary | ICD-10-CM

## 2011-08-05 DIAGNOSIS — F17200 Nicotine dependence, unspecified, uncomplicated: Secondary | ICD-10-CM

## 2011-08-05 DIAGNOSIS — I509 Heart failure, unspecified: Secondary | ICD-10-CM

## 2011-08-05 NOTE — Assessment & Plan Note (Signed)
Cessation is encouraged once again.

## 2011-08-05 NOTE — Assessment & Plan Note (Signed)
No chest pain. Remains on his Plavix. Samples were given.

## 2011-08-05 NOTE — Progress Notes (Signed)
[name] Date of Birth: 09-27-1945 Medical Record #[id number]  History of Present Illness: Mr. Pacer is seen today for a 3 week check. He is seen for Dr. Elease Hashimoto. He has had recent NSTEMI with DES x 2 to the SVG to the 1st and 2nd OM and the SVG to the PD. EF is 20%. He is on Plavix for one year. He remains on low dose ACE and beta blocker. He was having issues with dizziness and lightheadedness due to low blood pressures at his last visit and we changed his Lasix to just prn.   He comes in today. He is doing well. He is not as lightheaded or dizzy. No chest pain. Not really short of breath. Still smoking, but down to 3 to 4 cigarettes per day. No palpitations reported. He continues to work without problems.   Current Outpatient Prescriptions on File Prior to Visit  Medication Sig Dispense Refill  . aspirin EC 81 MG tablet Take 81 mg by mouth daily.        Marland Kitchen atorvastatin (LIPITOR) 40 MG tablet Take 1 tablet (40 mg total) by mouth daily.  30 tablet  11  . carvedilol (COREG) 3.125 MG tablet Take 1 tablet (3.125 mg total) by mouth 2 (two) times daily with a meal.  60 tablet  3  . clopidogrel (PLAVIX) 75 MG tablet Take 1 tablet (75 mg total) by mouth daily with breakfast.  30 tablet  3  . Fluticasone-Salmeterol (ADVAIR) 500-50 MCG/DOSE AEPB Inhale 1 puff into the lungs 2 (two) times daily.  60 each  3  . furosemide (LASIX) 40 MG tablet Take 1 tablet (40 mg total) by mouth daily as needed.  30 tablet  3  . ibuprofen (ADVIL,MOTRIN) 200 MG tablet Take 200 mg by mouth every 6 (six) hours as needed.        . insulin NPH-insulin regular (NOVOLIN 70/30) (70-30) 100 UNIT/ML injection Inject 25 Units into the skin 2 (two) times daily with a meal.  10 mL  12  . lisinopril (PRINIVIL,ZESTRIL) 5 MG tablet Take 1 tablet (5 mg total) by mouth 2 (two) times daily.  60 tablet  3  . nitroGLYCERIN (NITROSTAT) 0.4 MG SL tablet Place 1 tablet (0.4 mg total) under the tongue every 5 (five) minutes as needed  for chest pain.  25 tablet  3  . tiotropium (SPIRIVA) 18 MCG inhalation capsule Place 1 capsule (18 mcg total) into inhaler and inhale daily.  30 capsule  3    No Known Allergies  Past Medical History  Diagnosis Date  . Diabetes mellitus   . Colon cancer     s/p colectomy   . Hypertension   . CAD (coronary artery disease)     CABG in 1996  . NSTEMI (non-ST elevated myocardial infarction) Dec. 2012    DES x 2 to SVG to 1st and 2nd OM and SVG to PDA  . Left ventricular dysfunction Dec. 2012    EF is 20 to 25%  . Tobacco abuse   . COPD (chronic obstructive pulmonary disease)     Past Surgical History  Procedure Date  . Colon surgery   . Open heart surgery   . Coronary stent placement Dec. 2012    DES to SVG to 1st and 2nd OM and SVG to the PDA    History  Smoking status  . Current Some Day Smoker -- 1.0 packs/day for 50 years  . Types: Cigarettes  . Last Attempt to Quit:  06/07/2011  Smokeless tobacco  . Never Used    History  Alcohol Use No    Family History  Problem Relation Age of Onset  . Heart attack Father   . Diabetes Father   . Breast cancer Mother     Review of Systems: The review of systems is per the HPI.  All other systems were reviewed and are negative.  Physical Exam: BP 108/68  Pulse 76  Ht 5\' 10"  (1.778 m)  Wt 161 lb 3.2 oz (73.12 kg)  BMI 23.13 kg/m2 Patient is very pleasant and in no acute distress. Skin is warm and dry. Color is normal.  HEENT is unremarkable. Normocephalic/atraumatic. PERRL. Sclera are nonicteric. Neck is supple. No masses. No JVD. Lungs are clear with decreased breath sounds. Cardiac exam shows a regular rate and rhythm. Abdomen is soft. Extremities are without edema. Gait and ROM are intact. No gross neurologic deficits noted.  LABORATORY DATA:   Assessment / Plan:

## 2011-08-05 NOTE — Assessment & Plan Note (Signed)
He appears compensated. I do not think he will tolerate up titration of his medicines due to dizziness. We will plan on checking an echo after March 11th (90 days post MI) and then I will have him see Dr. Elease Hashimoto to discuss. Hopefully we will have seen improvement in his EF. If not, would consider ICD referral. Patient is agreeable to this plan and will call if any problems develop in the interim.

## 2011-08-05 NOTE — Patient Instructions (Signed)
We will get an ultrasound of your heart next month.  You will see Dr. Elease Hashimoto to discuss those results.  Stay on your current medicines.  Work on your smoking.   Call the [facility] office at [phone] if you have any questions, problems or concerns.

## 2011-08-23 ENCOUNTER — Other Ambulatory Visit: Payer: Self-pay | Admitting: Internal Medicine

## 2011-08-23 MED ORDER — FLUTICASONE-SALMETEROL 250-50 MCG/DOSE IN AEPB: 1.0000 | INHALATION_SPRAY | Freq: Two times a day (BID) | RESPIRATORY_TRACT | Status: DC

## 2011-08-23 NOTE — Telephone Encounter (Signed)
Patient was told at OV on 07/24/11 to finish the Advair 500/50 and then switch over to Advair 250/50. Pt needs RX for the 250/50 dosage. RX sent.

## 2011-09-06 NOTE — Progress Notes (Signed)
Quick Note:  Spoke with pt and notified of results per Dr. Sherene Sires. Pt verbalized understanding and denied any questions. Pt states will keep appt 10/22/11 for PFT's and with Dr. Sherene Sires. ______

## 2011-09-09 ENCOUNTER — Ambulatory Visit (HOSPITAL_COMMUNITY): Payer: Medicare Other | Attending: Cardiology

## 2011-09-09 ENCOUNTER — Other Ambulatory Visit: Payer: Self-pay

## 2011-09-09 DIAGNOSIS — R0609 Other forms of dyspnea: Secondary | ICD-10-CM | POA: Insufficient documentation

## 2011-09-09 DIAGNOSIS — I10 Essential (primary) hypertension: Secondary | ICD-10-CM | POA: Insufficient documentation

## 2011-09-09 DIAGNOSIS — E119 Type 2 diabetes mellitus without complications: Secondary | ICD-10-CM | POA: Insufficient documentation

## 2011-09-09 DIAGNOSIS — I519 Heart disease, unspecified: Secondary | ICD-10-CM

## 2011-09-09 DIAGNOSIS — I509 Heart failure, unspecified: Secondary | ICD-10-CM

## 2011-09-09 DIAGNOSIS — J4489 Other specified chronic obstructive pulmonary disease: Secondary | ICD-10-CM | POA: Insufficient documentation

## 2011-09-09 DIAGNOSIS — Z794 Long term (current) use of insulin: Secondary | ICD-10-CM | POA: Insufficient documentation

## 2011-09-09 DIAGNOSIS — I501 Left ventricular failure: Secondary | ICD-10-CM | POA: Insufficient documentation

## 2011-09-09 DIAGNOSIS — J449 Chronic obstructive pulmonary disease, unspecified: Secondary | ICD-10-CM | POA: Insufficient documentation

## 2011-09-09 DIAGNOSIS — R0989 Other specified symptoms and signs involving the circulatory and respiratory systems: Secondary | ICD-10-CM | POA: Insufficient documentation

## 2011-10-02 ENCOUNTER — Ambulatory Visit (INDEPENDENT_AMBULATORY_CARE_PROVIDER_SITE_OTHER): Payer: Medicare Other | Admitting: Cardiovascular Disease

## 2011-10-02 ENCOUNTER — Encounter: Payer: Self-pay | Admitting: Cardiovascular Disease

## 2011-10-02 VITALS — BP 109/68 | HR 61 | Ht 70.0 in | Wt 158.0 lb

## 2011-10-02 DIAGNOSIS — I509 Heart failure, unspecified: Secondary | ICD-10-CM

## 2011-10-02 DIAGNOSIS — I2510 Atherosclerotic heart disease of native coronary artery without angina pectoris: Secondary | ICD-10-CM

## 2011-10-02 NOTE — Patient Instructions (Addendum)
Your physician recommends that you schedule a follow-up appointment in: 3 months or a little sooner   You have been referred to EP for eval for ICD   Your physician recommends that you return for lab work in: 3 months BMET, BNP, fasting lipids, hepatic panel.

## 2011-10-02 NOTE — Assessment & Plan Note (Signed)
James Moon is doing quite well from a clinical standpoint. He's not had any episodes of chest pain or shortness of breath. He's basically symptomatically.  His recent echocardiogram revealed persistently depressed left ventricular systolic function. By my reading the EF is between 25-30%. I think that he needs to be referred to physiology for consideration of an ICD. I don't think that he needs criteria for biventricular pacemaker. His echocardiogram does not show any significant LV dyssynchrony. I don't think he would necessarily benefit from a biventricular pacemaker. I cemented in 3 months.  Obesities not having any angina or worsening symptoms of heart failure.  He encouraged him to stop smoking.

## 2011-10-02 NOTE — Progress Notes (Signed)
James Moon Date of Birth  [birth year] [facility]     [place] Office  [address]    [address]   [address] [place], [place]  [address]    [place], [place]  [address] [phone]  Fax  [phone]  [phone]  Fax [phone]  Problem List: 1. CAD  2. CHF - EF 25-30%  History of Present Illness:  James Moon is a 66 year old gentleman with a history of coronary artery disease. He status post non-ST segment elevation myocardial infarction in December. He had 2 stents placed. His ejection fraction was noted to be severely depressed at that time. He's been on good medical therapy. He returned for an echocardiogram last week which revealed persistently depressed left systolic function with ejection fraction by my reading of 25-30%. He has anterior and apical akinesis.  He's not had any symptoms of chest pain or shortness breath. He is overall done quite well. 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 He's not had any worsening symptoms. He denies any syncope or presyncope. He denies any PND or orthopnea.   Current Outpatient Prescriptions on File Prior to Visit  Medication Sig Dispense Refill  . aspirin EC 81 MG tablet Take 81 mg by mouth daily.        Marland Kitchen atorvastatin (LIPITOR) 40 MG tablet Take 1 tablet (40 mg total) by mouth daily.  30 tablet  11  . carvedilol (COREG) 3.125 MG tablet Take 1 tablet (3.125 mg total) by mouth 2 (two) times daily with a meal.  60 tablet  3  . clopidogrel (PLAVIX) 75 MG tablet Take 1 tablet (75 mg total) by mouth daily with breakfast.  30 tablet  3  . Fluticasone-Salmeterol (ADVAIR DISKUS) 250-50 MCG/DOSE AEPB Inhale 1 puff into the lungs 2 (two) times daily.  60 each  5  . furosemide (LASIX) 40 MG tablet Take 1 tablet (40 mg total) by mouth daily as needed.  30 tablet  3  . ibuprofen (ADVIL,MOTRIN) 200 MG tablet Take 200 mg by mouth every 6 (six) hours as needed.        . insulin NPH-insulin  regular (NOVOLIN 70/30) (70-30) 100 UNIT/ML injection Inject 25 Units into the skin 2 (two) times daily with a meal.  10 mL  12  . lisinopril (PRINIVIL,ZESTRIL) 5 MG tablet Take 1 tablet (5 mg total) by mouth 2 (two) times daily.  60 tablet  3  . nitroGLYCERIN (NITROSTAT) 0.4 MG SL tablet Place 1 tablet (0.4 mg total) under the tongue every 5 (five) minutes as needed for chest pain.  25 tablet  3  . tiotropium (SPIRIVA) 18 MCG inhalation capsule Place 1 capsule (18 mcg total) into inhaler and inhale daily.  30 capsule  3    No Known Allergies  Past Medical History  Diagnosis Date  . Diabetes mellitus   . Colon cancer     s/p colectomy   . Hypertension   . CAD (coronary artery disease)     CABG in 1996  . NSTEMI (non-ST elevated myocardial infarction) Dec. 11, 2012    DES x 2 to SVG to 1st and 2nd OM and SVG to PDA  . Left ventricular dysfunction Dec. 2012    EF is 20 to 25%  . Tobacco abuse   . COPD (chronic obstructive pulmonary disease)     Past Surgical History  Procedure Date  . Colon surgery   . Open  heart surgery   . Coronary stent placement Dec. 2012    DES to SVG to 1st and 2nd OM and SVG to the PDA    History  Smoking status  . Current Some Day Smoker -- 1.0 packs/day for 50 years  . Types: Cigarettes  . Last Attempt to Quit: 06/07/2011  Smokeless tobacco  . Never Used    History  Alcohol Use No    Family History  Problem Relation Age of Onset  . Heart attack Father   . Diabetes Father   . Breast cancer Mother     Reviw of Systems:  Reviewed in the HPI.  All other systems are negative.  Physical Exam: Blood pressure 109/68, pulse 61, height 5\' 10"  (1.778 m), weight 158 lb (71.668 kg). General: Well developed, well nourished, in no acute distress.  Head: Normocephalic, atraumatic, sclera non-icteric, mucus membranes are moist,   Neck: Supple. Carotids are 2 + without bruits. No JVD  Lungs: Clear bilaterally to auscultation.  Heart: regular  rate.  normal  S1 S2. There is a soft systolic murmur.  Abdomen: Soft, non-tender, non-distended with normal bowel sounds. No hepatomegaly. No rebound/guarding. No masses.  Msk:  Strength and tone are normal  Extremities: No clubbing or cyanosis. He has trace ankle edema. There some chronic stasis changes.  Neuro: Alert and oriented X 3. Moves all extremities spontaneously.  Psych:  Responds to questions appropriately with a normal affect.  ECG: 10/02/2011. Sinus bradycardia at 57 beats a minute. He hasn't left axis deviation. There is nonspecific IVCD. He has T-wave inversions in the lateral leads.  Assessment / Plan:

## 2011-10-16 ENCOUNTER — Other Ambulatory Visit: Payer: Self-pay | Admitting: Physician Assistant

## 2011-10-22 ENCOUNTER — Ambulatory Visit (INDEPENDENT_AMBULATORY_CARE_PROVIDER_SITE_OTHER): Payer: Medicare Other | Admitting: Internal Medicine

## 2011-10-22 ENCOUNTER — Encounter: Payer: Self-pay | Admitting: Internal Medicine

## 2011-10-22 ENCOUNTER — Ambulatory Visit (INDEPENDENT_AMBULATORY_CARE_PROVIDER_SITE_OTHER)
Admission: RE | Admit: 2011-10-22 | Discharge: 2011-10-22 | Disposition: A | Discharge status: Home / Self Care | Payer: Medicare Other | Source: Ambulatory Visit | Attending: Internal Medicine | Admitting: Internal Medicine

## 2011-10-22 VITALS — BP 90/60 | HR 70 | Temp 98.0°F | Ht 70.0 in | Wt 162.0 lb

## 2011-10-22 DIAGNOSIS — J449 Chronic obstructive pulmonary disease, unspecified: Secondary | ICD-10-CM

## 2011-10-22 DIAGNOSIS — F17200 Nicotine dependence, unspecified, uncomplicated: Secondary | ICD-10-CM

## 2011-10-22 LAB — PULMONARY FUNCTION TEST

## 2011-10-22 NOTE — Patient Instructions (Signed)
No change in your lung medications  Completely stop smoking before smoking completely stops you  Please remember to go to thex-ray department downstairs for your tests - we will call you with the results when they are available.  Please schedule a follow up visit in 6 months but call sooner if needed

## 2011-10-22 NOTE — Assessment & Plan Note (Signed)
I reviewed the Flethcher curve with patient that basically indicates  if you quit smoking when your best day FEV1 is still well preserved (which his still is)  it is highly unlikely you will progress to severe disease and informed the patient there was no medication on the market that has proven to change the curve or the likelihood of progression.  Therefore stopping smoking and maintaining abstinence is the most important aspect of care, not choice of inhalers or for that matter, doctors.    > 3 min discussion  I emphasized that although we never turn away smokers from the pulmonary clinic, we do ask that they understand that the recommendations that we make  won't work nearly as well in the presence of continued cigarette exposure.  In fact, we may very well  reach a point where we can't promise to help the patient if he/she can't quit smoking. (We can and will promise to try to help, we just can't promise what we recommend will really work)

## 2011-10-22 NOTE — Progress Notes (Signed)
Subjective:     Patient ID: James Moon, male   DOB: 11-Nov-1945    MRN: [id number]  HPI 20 yowm quit smoking at admit to [facility] 06/07/11 with chf/ihd/copd dx by pft's at FEV1 35% 06/2011 up to 50% 10/22/2011   06/26/2011 hosp  f/u ov/Dawanna Grauberger cc no sob on 02 24h per day, not smoking, resuming nl desired activities with no limitations so far. No cough rec Ok to use the 02  And the nebulizer as needed for now When your advair runs out, change to Advair 250 twice daily  until your visit       07/24/2011 f/u ov/Benen Weida still smoking 4 per day cc breathing better than last visit. Denies chest pain, chest tightness, cough.  Exercising on treadmill  1.5 level x 10-15 h No need for 02 or neb at this point rec    10/22/2011 f/u ov/Aleister Lady still smoking cc doe x [employer] walking but overall much better and no cough or need for daytime saba at all while on advair and spiriva.  No overt hb or sinus complaints. No longer using 02 or neb at all daytime, no overt hb or sinus complaints or variability to doe.    Sleeping ok without nocturnal  or early am exacerbation  of respiratory  c/o's or need for noct saba. Also denies any obvious fluctuation of symptoms with weather or environmental changes or other aggravating or alleviating factors except as outlined above   ROS  At present neg for  any significant sore throat, dysphagia, itching, sneezing,  nasal congestion or excess/ purulent secretions,  fever, chills, sweats, unintended wt loss, pleuritic or exertional cp, hempoptysis, orthopnea pnd or leg swelling.  Also denies presyncope, palpitations, heartburn, abdominal pain, nausea, vomiting, diarrhea  or change in bowel or urinary habits, dysuria,hematuria,  rash, arthralgias, visual complaints, headache, numbness weakness or ataxia.         Objective:   Physical Exam    pleasant amb wm nad Wt  06/26/2011  158 > 07/24/2011  161 > 10/22/2011  162 HEENT mild turbinate edema.  Oropharynx no thrush or excess pnd  or cobblestoning.  No JVD or cervical adenopathy. Mild accessory muscle hypertrophy. Trachea midline, nl thryroid. Chest was hyperinflated by percussion with diminished breath sounds and moderate increased exp time without wheeze. Hoover sign positive at end  inspiration. Regular rate and rhythm without murmur gallop or rub or increase P2 or edema.  Abd: no hsm, nl excursion. Ext warm without cyanosis or clubbing.    CXR  10/22/2011 :  No acute cardiopulmonary abnormality.     Assessment:          Plan:

## 2011-10-22 NOTE — Assessment & Plan Note (Addendum)
-   PFT's 06/12/11 FEV1  1.20 (35%) ratio is 50   DLC0  110%   - PFT's 10/22/2011 FEV11.56 (50%) ratio is 47 and DLCO 58% corrects to 69% - no better p B2   - 06/26/2011  Walked RA x 3 laps @ 185 ft each stopped due to  End of study, sats 88%   GOLD II/III Clearly improved not using neb or 02 but still smoking (discussed separately) - still on acei but appears to be tolerating well    Each maintenance medication was reviewed in detail including most importantly the difference between maintenance and as needed and under what circumstances the prns are to be used.  Please see instructions for details which were reviewed in writing and the patient given a copy.     Needs ono RA before stopping 02

## 2011-10-22 NOTE — Progress Notes (Signed)
PFT done today. 

## 2011-10-23 NOTE — Progress Notes (Signed)
Quick Note:  Spoke with pt and notified of results per Dr. Wert. Pt verbalized understanding and denied any questions.  ______ 

## 2011-10-25 ENCOUNTER — Other Ambulatory Visit: Payer: Self-pay | Admitting: Physician Assistant

## 2011-10-28 ENCOUNTER — Other Ambulatory Visit: Payer: Self-pay

## 2011-10-28 NOTE — Telephone Encounter (Signed)
I called pt and he had bad cell reception and stated he will call back later

## 2011-10-29 ENCOUNTER — Encounter: Payer: Self-pay | Admitting: Internal Medicine

## 2011-10-29 ENCOUNTER — Telehealth: Payer: Self-pay | Admitting: Internal Medicine

## 2011-10-29 ENCOUNTER — Other Ambulatory Visit: Payer: Self-pay | Admitting: Physician Assistant

## 2011-10-29 ENCOUNTER — Institutional Professional Consult (permissible substitution): Payer: Medicare Other | Admitting: Internal Medicine

## 2011-10-29 DIAGNOSIS — J9610 Chronic respiratory failure, unspecified whether with hypoxia or hypercapnia: Secondary | ICD-10-CM | POA: Insufficient documentation

## 2011-10-29 DIAGNOSIS — J449 Chronic obstructive pulmonary disease, unspecified: Secondary | ICD-10-CM

## 2011-10-29 MED ORDER — TIOTROPIUM BROMIDE MONOHYDRATE 18 MCG IN CAPS: 18.0000 ug | ORAL_CAPSULE | Freq: Every day | RESPIRATORY_TRACT | Status: DC

## 2011-10-29 NOTE — Telephone Encounter (Signed)
Called, spoke with pt.  He is requesting refills for spiriva - Engineer, civil (consulting).  Rx sent.  Pt aware.

## 2011-10-29 NOTE — Telephone Encounter (Signed)
Per MW- ONO on RA indicates pt needs to start using o2 with sleep- 2 lpm.   I have already sent order to [facility]. LMTCB for the pt

## 2011-10-30 ENCOUNTER — Other Ambulatory Visit: Payer: Self-pay | Admitting: *Deleted

## 2011-10-30 MED ORDER — TIOTROPIUM BROMIDE MONOHYDRATE 18 MCG IN CAPS: 18.0000 ug | ORAL_CAPSULE | Freq: Every day | RESPIRATORY_TRACT | Status: DC

## 2011-10-30 NOTE — Telephone Encounter (Signed)
Spoke with pt and notified of results per Dr. Wert. Pt verbalized understanding and denied any questions. 

## 2011-11-04 ENCOUNTER — Encounter: Payer: Self-pay | Admitting: Internal Medicine

## 2011-11-04 ENCOUNTER — Other Ambulatory Visit: Payer: Self-pay | Admitting: Physician Assistant

## 2011-11-06 ENCOUNTER — Other Ambulatory Visit: Payer: Self-pay | Admitting: Nurse Practitioner

## 2011-11-12 ENCOUNTER — Encounter: Payer: Self-pay | Admitting: Internal Medicine

## 2011-11-12 ENCOUNTER — Ambulatory Visit (INDEPENDENT_AMBULATORY_CARE_PROVIDER_SITE_OTHER): Payer: Medicare Other | Admitting: Internal Medicine

## 2011-11-12 VITALS — BP 126/62 | HR 68 | Ht 70.0 in | Wt 164.8 lb

## 2011-11-12 DIAGNOSIS — J449 Chronic obstructive pulmonary disease, unspecified: Secondary | ICD-10-CM

## 2011-11-12 DIAGNOSIS — F17200 Nicotine dependence, unspecified, uncomplicated: Secondary | ICD-10-CM

## 2011-11-12 DIAGNOSIS — I2589 Other forms of chronic ischemic heart disease: Secondary | ICD-10-CM

## 2011-11-12 DIAGNOSIS — I255 Ischemic cardiomyopathy: Secondary | ICD-10-CM

## 2011-11-12 MED ORDER — SPIRONOLACTONE 25 MG PO TABS: 25.0000 mg | ORAL_TABLET | Freq: Every day | ORAL | Status: DC

## 2011-11-12 NOTE — Progress Notes (Signed)
HPI  James Moon is a 65 y.o. male Seen at request of LG and Dr PNahser  He has recent NSTEMI with DES x 2 to the SVG to the 1st and 2nd OM and the SVG to the PD. EF was 20%  After prolonged Guideline directed medical therapy his EF remains 30-35 %  He has modest DOE < 1 flt of stairs and has recently developed peripheral edema which he blames on bunch of [employer]wiches  Intermittent palps without LH or SOB  No neuro sx susggestive of Stroke  Past Medical History  Diagnosis Date  . Diabetes mellitus   . Colon cancer     s/p colectomy   . Hypertension   . CAD (coronary artery disease)     CABG in 1996  . NSTEMI (non-ST elevated myocardial infarction) Dec. 11, 2012    DES x 2 to SVG to 1st and 2nd OM and SVG to PDA  . Left ventricular dysfunction Dec. 2012    EF is 20 to 25%  . Tobacco abuse   . COPD (chronic obstructive pulmonary disease)     Past Surgical History  Procedure Date  . Colon surgery   . Open heart surgery   . Coronary stent placement Dec. 2012    DES to SVG to 1st and 2nd OM and SVG to the PDA    Current Outpatient Prescriptions  Medication Sig Dispense Refill  . aspirin EC 81 MG tablet Take 81 mg by mouth daily.        . atorvastatin (LIPITOR) 40 MG tablet Take 1 tablet (40 mg total) by mouth daily.  30 tablet  11  . carvedilol (COREG) 3.125 MG tablet TAKE ONE TABLET BY MOUTH TWICE DAILY WITH A MEAL  60 tablet  6  . clopidogrel (PLAVIX) 75 MG tablet TAKE ONE TABLET BY MOUTH EVERY DAY WITH  BREAKFAST  30 tablet  5  . Fluticasone-Salmeterol (ADVAIR DISKUS) 250-50 MCG/DOSE AEPB Inhale 1 puff into the lungs 2 (two) times daily.  60 each  5  . furosemide (LASIX) 40 MG tablet Take 1 tablet (40 mg total) by mouth daily as needed.  30 tablet  3  . insulin NPH-insulin regular (NOVOLIN 70/30) (70-30) 100 UNIT/ML injection Inject 25 Units into the skin 2 (two) times daily with a meal.  10 mL  12  . lisinopril (PRINIVIL,ZESTRIL) 5 MG tablet TAKE ONE TABLET BY  MOUTH TWICE DAILY  60 tablet  7  . nitroGLYCERIN (NITROSTAT) 0.4 MG SL tablet Place 1 tablet (0.4 mg total) under the tongue every 5 (five) minutes as needed for chest pain.  25 tablet  3  . tiotropium (SPIRIVA) 18 MCG inhalation capsule Place 1 capsule (18 mcg total) into inhaler and inhale daily.  30 capsule  5  . DISCONTD: Fluticasone-Salmeterol (ADVAIR) 500-50 MCG/DOSE AEPB Inhale 1 puff into the lungs daily.        No Known Allergies  Review of Systems negative except from HPI and PMH  Physical Exam BP 126/62  Pulse 68  Ht 5' 10" (1.778 m)  Wt 164 lb 12.8 oz (74.753 kg)  BMI 23.65 kg/m2 Well developed and well nourished in no acute distress HENT normal E scleral and icterus clear Neck Supple JVP 7-8; carotids brisk and full Clear to ausculation Regular rate and rhythm, no murmurs gallops or rub Soft with active bowel sounds No clubbing cyanosis none Edema Alert and oriented, grossly normal motor and sensory function Skin Warm and Dry     ECG NSR 76  15/12/39 LAD PAC Assessment and  Plan  

## 2011-11-12 NOTE — Patient Instructions (Addendum)
Your physician has recommended that you have a defibrillator inserted. An implantable cardioverter defibrillator (ICD) is a small device that is placed in your chest or, in rare cases, your abdomen. This device uses electrical pulses or shocks to help control life-threatening, irregular heartbeats that could lead the heart to suddenly stop beating (sudden cardiac arrest). Leads are attached to the ICD that goes into your heart. This is done in the hospital and usually requires an overnight stay. Please see the instruction sheet given to you today for more information.  Please call Dr. Odessa Fleming nurse, Herbert Seta, and let me know what date you would like to be scheduled for your defibrillator. - 5/22, 5/24, 6/3, 6/5, 6/12, 6/14, 6/18, 6/21, 6/24, 6/26  Your physician has recommended you make the following change in your medication:  1) Start Aldactone (spironolactone) 25 mg one tablet by mouth daily.

## 2011-11-12 NOTE — Assessment & Plan Note (Signed)
Reiterated the importance of stopping smoking

## 2011-11-12 NOTE — Assessment & Plan Note (Signed)
Long discussion re strageties for stopping smoking >12 min

## 2011-11-12 NOTE — Assessment & Plan Note (Signed)
He has persitent lV dysfunction despite Guideline directed medical therapy He is a candidate for ICD implantation for primary prevention  Have reviewed the potential benefits and risks of ICD implantation including but not limited to death, perforation of heart or lung, lead dislodgement, infection,  device malfunction and inappropriate shocks.  The patient  expresses understanding  and is willing to proceed.    We have also discussed enrolloment in the analyze ST trial and will have him discuss this with the research RN

## 2011-11-14 ENCOUNTER — Encounter: Payer: Self-pay | Admitting: *Deleted

## 2011-11-29 ENCOUNTER — Encounter (HOSPITAL_COMMUNITY): Payer: Self-pay | Admitting: Pharmacy Technician

## 2011-12-02 ENCOUNTER — Other Ambulatory Visit: Payer: Self-pay | Admitting: Internal Medicine

## 2011-12-02 DIAGNOSIS — I2589 Other forms of chronic ischemic heart disease: Secondary | ICD-10-CM

## 2011-12-03 NOTE — Progress Notes (Signed)
Addended by: Reine Just on: 12/03/2011 07:06 PM   Modules accepted: Orders

## 2011-12-04 ENCOUNTER — Other Ambulatory Visit (INDEPENDENT_AMBULATORY_CARE_PROVIDER_SITE_OTHER): Payer: Medicare Other

## 2011-12-04 ENCOUNTER — Telehealth: Payer: Self-pay | Admitting: *Deleted

## 2011-12-04 ENCOUNTER — Telehealth: Payer: Self-pay | Admitting: Cardiovascular Disease

## 2011-12-04 DIAGNOSIS — J449 Chronic obstructive pulmonary disease, unspecified: Secondary | ICD-10-CM

## 2011-12-04 DIAGNOSIS — I2589 Other forms of chronic ischemic heart disease: Secondary | ICD-10-CM

## 2011-12-04 DIAGNOSIS — I255 Ischemic cardiomyopathy: Secondary | ICD-10-CM

## 2011-12-04 LAB — CBC WITH DIFFERENTIAL/PLATELET
Basophils Relative: 1.1 % (ref 0.0–3.0)
Eosinophils Relative: 4.6 % (ref 0.0–5.0)
HCT: 48 % (ref 39.0–52.0)
Lymphs Abs: 1.3 10*3/uL (ref 0.7–4.0)
MCV: 98.3 fl (ref 78.0–100.0)
Monocytes Absolute: 0.6 10*3/uL (ref 0.1–1.0)
RBC: 4.89 Mil/uL (ref 4.22–5.81)
WBC: 6.7 10*3/uL (ref 4.5–10.5)

## 2011-12-04 LAB — BASIC METABOLIC PANEL
BUN: 25 mg/dL — ABNORMAL HIGH (ref 6–23)
Calcium: 9.3 mg/dL (ref 8.4–10.5)
GFR: 66.96 mL/min (ref >60.00)
Glucose, Bld: 152 mg/dL — ABNORMAL HIGH (ref 70–99)
Potassium: 5.5 mEq/L — ABNORMAL HIGH (ref 3.5–5.1)

## 2011-12-04 NOTE — Telephone Encounter (Signed)
Walk in pt Form " pt Dropped Off FMLA" sent to [employer]  For Completion 12/04/11/KM

## 2011-12-04 NOTE — Telephone Encounter (Signed)
Spoke with pt and advised him to stop taking spironolactone due to elevated K+(per dr taylor-DOD)--pt agrees

## 2011-12-05 NOTE — Telephone Encounter (Signed)
Posted Nancy's message to the patient's labs.

## 2011-12-10 ENCOUNTER — Other Ambulatory Visit: Payer: Self-pay | Admitting: *Deleted

## 2011-12-10 MED ORDER — SODIUM CHLORIDE 0.9 % IJ SOLN: 3.0000 mL | Freq: Two times a day (BID) | INTRAMUSCULAR | Status: DC

## 2011-12-10 MED ORDER — CEFAZOLIN SODIUM-DEXTROSE 2-3 GM-% IV SOLR
2.0000 g | INTRAVENOUS | Status: DC
  Filled 2011-12-10 (×2): qty 50

## 2011-12-10 MED ORDER — SODIUM CHLORIDE 0.45 % IV SOLN
INTRAVENOUS | Status: DC
  Administered 2011-12-11: 07:00:00 via INTRAVENOUS

## 2011-12-10 MED ORDER — SODIUM CHLORIDE 0.9 % IJ SOLN: 3.0000 mL | INTRAMUSCULAR | Status: DC | PRN

## 2011-12-10 MED ORDER — CHLORHEXIDINE GLUCONATE 4 % EX LIQD
60.0000 mL | Freq: Once | CUTANEOUS | Status: DC
  Filled 2011-12-10: qty 60

## 2011-12-10 MED ORDER — SODIUM CHLORIDE 0.9 % IV SOLN: 250.0000 mL | INTRAVENOUS | Status: DC

## 2011-12-10 MED ORDER — SODIUM CHLORIDE 0.9 % IR SOLN
80.0000 mg | Status: DC
  Filled 2011-12-10: qty 2

## 2011-12-11 ENCOUNTER — Encounter (HOSPITAL_COMMUNITY)
Admission: RE | Disposition: A | Discharge status: Home / Self Care | Payer: Self-pay | Source: Ambulatory Visit | Attending: Internal Medicine

## 2011-12-11 ENCOUNTER — Ambulatory Visit (HOSPITAL_COMMUNITY)
Admission: RE | Admit: 2011-12-11 | Discharge: 2011-12-12 | Disposition: A | Discharge status: Home / Self Care | Payer: Medicare Other | Source: Ambulatory Visit | Attending: Internal Medicine | Admitting: Internal Medicine

## 2011-12-11 DIAGNOSIS — I2589 Other forms of chronic ischemic heart disease: Secondary | ICD-10-CM

## 2011-12-11 DIAGNOSIS — E119 Type 2 diabetes mellitus without complications: Secondary | ICD-10-CM | POA: Insufficient documentation

## 2011-12-11 DIAGNOSIS — J4489 Other specified chronic obstructive pulmonary disease: Secondary | ICD-10-CM | POA: Insufficient documentation

## 2011-12-11 DIAGNOSIS — I252 Old myocardial infarction: Secondary | ICD-10-CM | POA: Insufficient documentation

## 2011-12-11 DIAGNOSIS — I10 Essential (primary) hypertension: Secondary | ICD-10-CM | POA: Insufficient documentation

## 2011-12-11 DIAGNOSIS — F17200 Nicotine dependence, unspecified, uncomplicated: Secondary | ICD-10-CM | POA: Insufficient documentation

## 2011-12-11 DIAGNOSIS — J449 Chronic obstructive pulmonary disease, unspecified: Secondary | ICD-10-CM | POA: Insufficient documentation

## 2011-12-11 DIAGNOSIS — I2510 Atherosclerotic heart disease of native coronary artery without angina pectoris: Secondary | ICD-10-CM | POA: Insufficient documentation

## 2011-12-11 HISTORY — PX: IMPLANTABLE CARDIOVERTER DEFIBRILLATOR IMPLANT: SHX5473

## 2011-12-11 LAB — GLUCOSE, CAPILLARY
Glucose-Capillary: 108 mg/dL — ABNORMAL HIGH (ref 70–99)
Glucose-Capillary: 52 mg/dL — ABNORMAL LOW (ref 70–99)
Glucose-Capillary: 146 mg/dL — ABNORMAL HIGH (ref 70–99)
Glucose-Capillary: 122 mg/dL — ABNORMAL HIGH (ref 70–99)
Glucose-Capillary: 401 mg/dL — ABNORMAL HIGH (ref 70–99)
Glucose-Capillary: 374 mg/dL — ABNORMAL HIGH (ref 70–99)

## 2011-12-11 LAB — BASIC METABOLIC PANEL
BUN: 26 mg/dL — ABNORMAL HIGH (ref 6–23)
CO2: 28 mEq/L (ref 19–32)
Calcium: 9.3 mg/dL (ref 8.4–10.5)
Creatinine, Ser: 1.01 mg/dL (ref 0.50–1.35)

## 2011-12-11 SURGERY — IMPLANTABLE CARDIOVERTER DEFIBRILLATOR IMPLANT
Anesthesia: LOCAL

## 2011-12-11 MED ORDER — NITROGLYCERIN 0.4 MG SL SUBL: 0.4000 mg | SUBLINGUAL_TABLET | SUBLINGUAL | Status: DC | PRN

## 2011-12-11 MED ORDER — ASPIRIN EC 81 MG PO TBEC
81.0000 mg | DELAYED_RELEASE_TABLET | Freq: Every day | ORAL | Status: DC
Start: 2011-12-11 — End: 2011-12-11
  Filled 2011-12-11: qty 1

## 2011-12-11 MED ORDER — INSULIN ASPART PROT & ASPART (70-30 MIX) 100 UNIT/ML ~~LOC~~ SUSP
25.0000 [IU] | Freq: Two times a day (BID) | SUBCUTANEOUS | Status: DC
  Administered 2011-12-11: 25 [IU] via SUBCUTANEOUS
  Filled 2011-12-11 (×2): qty 3

## 2011-12-11 MED ORDER — INSULIN NPH ISOPHANE & REGULAR (70-30) 100 UNIT/ML ~~LOC~~ SUSP: 25.0000 [IU] | Freq: Two times a day (BID) | SUBCUTANEOUS | Status: DC

## 2011-12-11 MED ORDER — DEXTROSE-NACL 5-0.45 % IV SOLN: INTRAVENOUS | Status: DC

## 2011-12-11 MED ORDER — ATORVASTATIN CALCIUM 40 MG PO TABS
40.0000 mg | ORAL_TABLET | Freq: Every day | ORAL | Status: DC
  Administered 2011-12-11: 40 mg via ORAL
  Filled 2011-12-11 (×2): qty 1

## 2011-12-11 MED ORDER — FLUTICASONE-SALMETEROL 250-50 MCG/DOSE IN AEPB
1.0000 | INHALATION_SPRAY | Freq: Two times a day (BID) | RESPIRATORY_TRACT | Status: DC
  Administered 2011-12-12: 1 via RESPIRATORY_TRACT
  Filled 2011-12-11: qty 14

## 2011-12-11 MED ORDER — ACETAMINOPHEN 325 MG PO TABS
325.0000 mg | ORAL_TABLET | ORAL | Status: DC | PRN
  Administered 2011-12-11 (×2): 650 mg via ORAL
  Filled 2011-12-11 (×2): qty 2

## 2011-12-11 MED ORDER — SODIUM CHLORIDE 0.9 % IV SOLN
INTRAVENOUS | Status: DC
  Administered 2011-12-11: 21:00:00 via INTRAVENOUS

## 2011-12-11 MED ORDER — ASPIRIN EC 81 MG PO TBEC
81.0000 mg | DELAYED_RELEASE_TABLET | Freq: Every day | ORAL | Status: DC
  Administered 2011-12-12: 81 mg via ORAL
  Filled 2011-12-11: qty 1

## 2011-12-11 MED ORDER — CLOPIDOGREL BISULFATE 75 MG PO TABS
75.0000 mg | ORAL_TABLET | Freq: Every day | ORAL | Status: DC
  Administered 2011-12-12: 75 mg via ORAL
  Filled 2011-12-11: qty 1

## 2011-12-11 MED ORDER — HEPARIN (PORCINE) IN NACL 2-0.9 UNIT/ML-% IJ SOLN
INTRAMUSCULAR | Status: AC
  Filled 2011-12-11: qty 1000

## 2011-12-11 MED ORDER — MUPIROCIN 2 % EX OINT
TOPICAL_OINTMENT | Freq: Two times a day (BID) | CUTANEOUS | Status: DC
  Administered 2011-12-11: 1 via NASAL
  Filled 2011-12-11 (×3): qty 22

## 2011-12-11 MED ORDER — MIDAZOLAM HCL 5 MG/5ML IJ SOLN
INTRAMUSCULAR | Status: AC
  Filled 2011-12-11: qty 5

## 2011-12-11 MED ORDER — CEFAZOLIN SODIUM 1-5 GM-% IV SOLN
1.0000 g | Freq: Four times a day (QID) | INTRAVENOUS | Status: AC
  Administered 2011-12-11 – 2011-12-12 (×3): 1 g via INTRAVENOUS
  Filled 2011-12-11 (×3): qty 50

## 2011-12-11 MED ORDER — CARVEDILOL 3.125 MG PO TABS
3.1250 mg | ORAL_TABLET | Freq: Two times a day (BID) | ORAL | Status: DC
  Administered 2011-12-11 – 2011-12-12 (×2): 3.125 mg via ORAL
  Filled 2011-12-11 (×4): qty 1

## 2011-12-11 MED ORDER — TIOTROPIUM BROMIDE MONOHYDRATE 18 MCG IN CAPS
18.0000 ug | ORAL_CAPSULE | Freq: Every day | RESPIRATORY_TRACT | Status: DC
  Administered 2011-12-12: 18 ug via RESPIRATORY_TRACT
  Filled 2011-12-11: qty 5

## 2011-12-11 MED ORDER — SPIRONOLACTONE 25 MG PO TABS
25.0000 mg | ORAL_TABLET | Freq: Every day | ORAL | Status: DC
  Administered 2011-12-12: 25 mg via ORAL
  Filled 2011-12-11: qty 1

## 2011-12-11 MED ORDER — FENTANYL CITRATE 0.05 MG/ML IJ SOLN
INTRAMUSCULAR | Status: AC
  Filled 2011-12-11: qty 2

## 2011-12-11 MED ORDER — LISINOPRIL 5 MG PO TABS
5.0000 mg | ORAL_TABLET | Freq: Two times a day (BID) | ORAL | Status: DC
  Administered 2011-12-12: 5 mg via ORAL
  Filled 2011-12-11 (×3): qty 1

## 2011-12-11 MED ORDER — ONDANSETRON HCL 4 MG/2ML IJ SOLN: 4.0000 mg | Freq: Four times a day (QID) | INTRAMUSCULAR | Status: DC | PRN

## 2011-12-11 MED ORDER — LIDOCAINE HCL (PF) 1 % IJ SOLN
INTRAMUSCULAR | Status: AC
  Filled 2011-12-11: qty 60

## 2011-12-11 MED ORDER — DEXTROSE 50 % IV SOLN
INTRAVENOUS | Status: AC
  Filled 2011-12-11: qty 50

## 2011-12-11 MED ORDER — INSULIN ASPART 100 UNIT/ML ~~LOC~~ SOLN
8.0000 [IU] | Freq: Once | SUBCUTANEOUS | Status: AC
  Administered 2011-12-11: 8 [IU] via SUBCUTANEOUS

## 2011-12-11 MED ORDER — DEXTROSE-NACL 5-0.45 % IV SOLN
INTRAVENOUS | Status: AC
  Administered 2011-12-11: 11:00:00 via INTRAVENOUS

## 2011-12-11 NOTE — Progress Notes (Signed)
Patient received from cath lab via stretcher post ICD implant. Surgical site C/D/I. VSS. Patient alert/ oriented, denies pain. Lurline Idol [facility]

## 2011-12-11 NOTE — Research (Signed)
ANALYSE ST Informed Consent   Subject Name: James Moon  Subject met inclusion and exclusion criteria.  The informed consent form, study requirements and expectations were reviewed with the subject and questions and concerns were addressed prior to the signing of the consent form.  The subject verbalized understanding of the trail requirements.  The subject agreed to participate in the ANALYZE ST trial and signed the informed consent.  The informed consent was obtained prior to performance of any protocol-specific procedures for the subject.  A copy of the signed informed consent was given to the subject and a copy was placed in the subject's medical record.  [name] 12/04/2011

## 2011-12-11 NOTE — CV Procedure (Signed)
Dict  [phone]

## 2011-12-11 NOTE — Interval H&P Note (Signed)
History and Physical Interval Note:  12/11/2011 7:43 AM  James Moon  has presented today for surgery, with the diagnosis of hf/asm  The various methods of treatment have been discussed with the patient and family. After consideration of risks, benefits and other options for treatment, the patient has consented to  Procedure(s) (LRB): IMPLANTABLE CARDIOVERTER DEFIBRILLATOR IMPLANT (N/A) as a surgical intervention .  The patients' history has been reviewed, patient examined, no change in status, stable for surgery.  I have reviewed the patients' chart and labs.  Questions were answered to the patient's satisfaction.     Sherryl Manges

## 2011-12-11 NOTE — Progress Notes (Signed)
Pt Glycemia is 401 before receiving evening dose of Novolog- protamine 70/30, paged physician on call, will continue to monitor.

## 2011-12-11 NOTE — Op Note (Signed)
NAMEJHONATAN, James Moon NO.:  [id number]  MEDICAL RECORD NO.:  [id number]  LOCATION:  [facility]                         FACILITY:  [facility]  PHYSICIAN:  Duke Salvia, MD, FACCDATE OF BIRTH:  26-Apr-1946  DATE OF PROCEDURE:  12/11/2011 DATE OF DISCHARGE:                              OPERATIVE REPORT   SURGEON:  Duke Salvia, MD, [facility]  PREOPERATIVE DIAGNOSIS:  Ischemic cardiomyopathy.  POSTOPERATIVE DIAGNOSIS:  Ischemic cardiomyopathy.  PROCEDURE:  Single-chamber defibrillator implantation.  DESCRIPTION OF PROCEDURE:  Following obtaining informed consent, the patient was brought to the electrophysiology laboratory and placed on the fluoroscopic table in supine position.  After routine prep and drape of the left upper chest, lidocaine was infiltrated in the prepectoral subclavicular region.  An incision was made and carried down to layer of the prepectoral fascia using electrocautery and sharp dissection.  A pocket was formed.  Hemostasis was obtained.  Thereafter, attention was turned to gain access to the extrathoracic left subclavian vein, which was accomplished without difficulty and without the aspiration or puncture of the artery.  Number of attempts were required.  A 9.5-French sheath was placed.  A [employer] lead, model 4098, serial number [id number] was placed under fluoroscopic guidance to the right ventricular apex with bipolar R-wave was 14.2 with a pacing impedance of 44, threshold of 1 volt at 0.5.  The lead was secured to the prepectoral fascia and then attached to a [employer] Fortify ICD, serial number [id number].  Through the device, the bipolar R-wave was 12 with a pacing impedance of 450, a threshold of 1 volt at 0.5.  High-voltage impedance was 70 ohms.  The pocket was copiously irrigated with antibiotic containing saline solution.  Hemostasis was assured.  The leads and pulse generator were placed in the pocket, secured to  the prepectoral fascia, and the wound was then closed in 3 layers in normal fashion.  The wound was washed, dried, and a benzoin and Steri-Strip dressing was applied.  Needle counts, sponge counts, and instrument counts were correct at the end of the procedure according to the staff.  The patient tolerated the procedure without apparent complication.     Duke Salvia, MD, [facility]     SCK/MEDQ  D:  12/11/2011  T:  12/11/2011  Job:  [id number]

## 2011-12-11 NOTE — H&P (View-Only) (Signed)
HPI  James Moon is a 66 y.o. male Seen at request of LG and Dr Publishing rights manager  He has recent NSTEMI with DES x 2 to the SVG to the 1st and 2nd OM and the SVG to the PD. EF was 20%  After prolonged Guideline directed medical therapy his EF remains 30-35 %  He has modest DOE < 1 flt of stairs and has recently developed peripheral edema which he blames on bunch of [employer] Sandwiches  Intermittent palps without LH or SOB  No neuro sx susggestive of Stroke  Past Medical History  Diagnosis Date  . Diabetes mellitus   . Colon cancer     s/p colectomy   . Hypertension   . CAD (coronary artery disease)     CABG in 1996  . NSTEMI (non-ST elevated myocardial infarction) Dec. 11, 2012    DES x 2 to SVG to 1st and 2nd OM and SVG to PDA  . Left ventricular dysfunction Dec. 2012    EF is 20 to 25%  . Tobacco abuse   . COPD (chronic obstructive pulmonary disease)     Past Surgical History  Procedure Date  . Colon surgery   . Open heart surgery   . Coronary stent placement Dec. 2012    DES to SVG to 1st and 2nd OM and SVG to the PDA    Current Outpatient Prescriptions  Medication Sig Dispense Refill  . aspirin EC 81 MG tablet Take 81 mg by mouth daily.        Marland Kitchen atorvastatin (LIPITOR) 40 MG tablet Take 1 tablet (40 mg total) by mouth daily.  30 tablet  11  . carvedilol (COREG) 3.125 MG tablet TAKE ONE TABLET BY MOUTH TWICE DAILY WITH A MEAL  60 tablet  6  . clopidogrel (PLAVIX) 75 MG tablet TAKE ONE TABLET BY MOUTH EVERY DAY WITH  BREAKFAST  30 tablet  5  . Fluticasone-Salmeterol (ADVAIR DISKUS) 250-50 MCG/DOSE AEPB Inhale 1 puff into the lungs 2 (two) times daily.  60 each  5  . furosemide (LASIX) 40 MG tablet Take 1 tablet (40 mg total) by mouth daily as needed.  30 tablet  3  . insulin NPH-insulin regular (NOVOLIN 70/30) (70-30) 100 UNIT/ML injection Inject 25 Units into the skin 2 (two) times daily with a meal.  10 mL  12  . lisinopril (PRINIVIL,ZESTRIL) 5 MG tablet TAKE ONE TABLET BY  MOUTH TWICE DAILY  60 tablet  7  . nitroGLYCERIN (NITROSTAT) 0.4 MG SL tablet Place 1 tablet (0.4 mg total) under the tongue every 5 (five) minutes as needed for chest pain.  25 tablet  3  . tiotropium (SPIRIVA) 18 MCG inhalation capsule Place 1 capsule (18 mcg total) into inhaler and inhale daily.  30 capsule  5  . DISCONTD: Fluticasone-Salmeterol (ADVAIR) 500-50 MCG/DOSE AEPB Inhale 1 puff into the lungs daily.        No Known Allergies  Review of Systems negative except from HPI and PMH  Physical Exam BP 126/62  Pulse 68  Ht 5\' 10"  (1.778 m)  Wt 164 lb 12.8 oz (74.753 kg)  BMI 23.65 kg/m2 Well developed and well nourished in no acute distress HENT normal E scleral and icterus clear Neck Supple JVP 7-8; carotids brisk and full Clear to ausculation Regular rate and rhythm, no murmurs gallops or rub Soft with active bowel sounds No clubbing cyanosis none Edema Alert and oriented, grossly normal motor and sensory function Skin Warm and Dry  ECG NSR 76  15/12/39 LAD PAC Assessment and  Plan

## 2011-12-12 ENCOUNTER — Encounter: Payer: Self-pay | Admitting: *Deleted

## 2011-12-12 ENCOUNTER — Encounter (HOSPITAL_COMMUNITY): Payer: Self-pay | Admitting: General Practice

## 2011-12-12 ENCOUNTER — Observation Stay (HOSPITAL_COMMUNITY): Payer: Medicare Other

## 2011-12-12 DIAGNOSIS — I2589 Other forms of chronic ischemic heart disease: Secondary | ICD-10-CM

## 2011-12-12 DIAGNOSIS — Z95810 Presence of automatic (implantable) cardiac defibrillator: Secondary | ICD-10-CM | POA: Insufficient documentation

## 2011-12-12 LAB — GLUCOSE, CAPILLARY: Glucose-Capillary: 192 mg/dL — ABNORMAL HIGH (ref 70–99)

## 2011-12-12 NOTE — Progress Notes (Signed)
  Patient Name: James Moon      SUBJECTIVE: withoug complaint  No cp or sob  Past Medical History  Diagnosis Date  . Diabetes mellitus   . Colon cancer     s/p colectomy   . Hypertension   . CAD (coronary artery disease)     CABG in 1996  . NSTEMI (non-ST elevated myocardial infarction) Dec. 11, 2012    DES x 2 to SVG to 1st and 2nd OM and SVG to PDA  . Ischemic cardiomyopathy Dec. 2012    EF is 20 to 25%>30-35% echo 3/13  . Tobacco abuse   . COPD (chronic obstructive pulmonary disease)     PHYSICAL EXAM Filed Vitals:   12/11/11 1500 12/11/11 1600 12/11/11 2118 12/12/11 0500  BP: 118/59 125/63 104/63 112/58  Pulse: 65 68 65 64  Temp:   98.3 F (36.8 C) 98.1 F (36.7 C)  TempSrc:   Oral Oral  Resp:    20  Height:      Weight:      SpO2:   95% 95%   Well developed and nourished in no acute distress HENT normal Neck supple with JVP-flat Clear Pocket w/o hematoma Regular rate and rhythm, no murmurs or gallops Abd-soft with active BS No Clubbing cyanosis edema Skin-warm and dry A & Oriented  Grossly normal sensory and motor function   TELEMETRY: Reviewed telemetry pt in NSR:    Intake/Output Summary (Last 24 hours) at 12/12/11 0718 Last data filed at 12/11/11 1700  Gross per 24 hour  Intake    800 ml  Output    900 ml  Net   -100 ml    LABS: Basic Metabolic Panel:  Lab 12/11/11 1610  NA 140  K 4.2  CL 102  CO2 28  GLUCOSE 116*  BUN 26*  CREATININE 1.01  CALCIUM 9.3  MG --  PHOS --    Device Interrogation: normal device function   ASSESSMENT AND PLAN:  S/p IMplant for primary prevention F/u wound clinic 10-12 days,  F/u Nahser as scheduled F/u SK 3months   Signed, Sherryl Manges MD  12/12/2011

## 2011-12-12 NOTE — Progress Notes (Signed)
D/C orders received.  D/C instructions and medications reviewed with pt.  Pt verbalizes understanding.  Pt displays no s/s of distress.  Pt d/c'ed to home.

## 2011-12-12 NOTE — Discharge Summary (Signed)
ELECTROPHYSIOLOGY DISCHARGE SUMMARY    Patient ID: James Moon,  MRN: [id number], DOB/AGE: 1945-10-14 66 y.o.  Admit date: 12/11/2011 Discharge date: 12/12/2011  Primary Care Physician: James Espy, MD Primary Cardiologist: James Hashimoto, MD Primary Electrophysiologist: James Mount, MD  Primary Discharge Diagnosis:  1. Ischemic cardiomyopathy, EF 30-35% - s/p single chamber ICD implantation for primary prevention of SCD  Secondary Discharge Diagnoses:  1. CAD s/p CABG in 1996, s/p recent NSTEMI with DES x 2 to the SVG to the 1st and 2nd OM and the SVG to the PD December 2012 2. Diabetes mellitus  3. HTN 4. COPD 5. Tobacco abuse 6. Colon cancer s/p colectomy   Procedures This Admission:   1. Single chamber ICD implantation  [employer] lead, model [id number], serial number [id number] was placed under fluoroscopic guidance to the right ventricular apex with bipolar R-wave was 14.2 with a pacing impedance of 44, threshold of 1 volt at 0.5. The lead was secured to the prepectoral fascia and then attached to a [employer] Fortify ICD, serial number [id number]. Through the device, the bipolar R-wave was 12 with a pacing impedance of 450, a threshold of 1 volt at 0.5. High-voltage impedance was 70 ohms.  History and Hospital Course:  James Moon is a 66 year old gentleman with an ischemic cardiomyopathy, EF 30-35%, who was evaluated as an outpatient for ICD therapy for primary prevention of SCD. He recently underwent PCI and despite revascularization and optimal medical therapy, his LVEF remains low. He underwent single chamber ICD implantation on 12/11/2011. He tolerated the procedure well without any immediate complication. He remained hemodynamically stable and afebrile. His chest x-ray shows stable lead placement without pneumothorax. His device interrogation showed normal ICD function with stable lead parameters. His implant site is intact without significant bleeding or  hematoma. He has been ambulating without difficulty. He has been seen, examined and deemed stable for discharge by Dr. Berton Moon.  Discharge Vitals: Blood pressure 104/60, pulse 70, temperature 98.1 F (36.7 C), temperature source Oral, resp. rate 20, height 5\' 10"  (1.778 m), weight 155 lb (70.308 kg), SpO2 95.00%.   Labs: Lab Results  Component Value Date   WBC 6.7 12/04/2011   HGB 15.8 12/04/2011   HCT 48.0 12/04/2011   MCV 98.3 12/04/2011   PLT 128.0* 12/04/2011    Lab 12/11/11 0655  NA 140  K 4.2  CL 102  CO2 28  BUN 26*  CREATININE 1.01  CALCIUM 9.3  PROT --  BILITOT --  ALKPHOS --  ALT --  AST --  GLUCOSE 116*   Disposition:  The patient is being discharged in stable condition.  Follow-up: Follow-up Information    Follow up with James Moon on 12/25/2011. (At 3:30 PM for wound check)    Contact information:   [address] [place] [address] [phone]   Follow up with James Manges, MD on 03/03/2012. (At 10:15 AM)    Contact information:   [address] [place] [address] [phone]   Discharge Medications:   TAKE these medications     aspirin EC 81 MG tablet   Take 81 mg by mouth daily.      atorvastatin 40 MG tablet   Commonly known as: LIPITOR   Take 40 mg by mouth daily.      carvedilol 3.125 MG tablet   Commonly known as: COREG   Take 3.125 mg by mouth 2 (two)  times daily with a meal.      clopidogrel 75 MG tablet   Commonly known as: PLAVIX   Take 75 mg by mouth daily.      fluticasone-Salmeterol 250-50 MCG/DOSE Aepb   Commonly known as: ADVAIR   Inhale 1 puff into the lungs every 12 (twelve) hours.      furosemide 40 MG tablet   Commonly known as: LASIX   Take 40 mg by mouth daily as needed. For fluid      insulin NPH-insulin regular (70-30) 100 UNIT/ML injection   Commonly known as: NOVOLIN 70/30   Inject 25 Units into the skin 2 (two) times daily with a  meal.      lisinopril 5 MG tablet   Commonly known as: PRINIVIL,ZESTRIL   Take 5 mg by mouth 2 (two) times daily.      nitroglycerin 0.4 MG SL tablet   Commonly known as: NITROSTAT   Place 0.4 mg under the tongue every 5 (five) minutes as needed. For chest pain      spironolactone 25 MG tablet   Commonly known as: ALDACTONE   Take 25 mg by mouth daily.      titropium 18 MCG inhalation capsule   Commonly known as: SPIRIVA   Place 18 mcg into inhaler and inhale daily.      Duration of Discharge Encounter: Greater than 30 minutes including physician time.  Signed, James Duff, PA-C 12/12/2011, 10:15 AM

## 2011-12-12 NOTE — Progress Notes (Signed)
UR Completed Yani Coventry Graves-Bigelow, RN,BSN [phone]  

## 2011-12-12 NOTE — Care Management Note (Signed)
    Page 1 of 1   12/12/2011     10:02:57 AM   CARE MANAGEMENT NOTE 12/12/2011  Patient:  James Moon, DILEONARDO   Account Number:  [id number]  Date Initiated:  12/12/2011  Documentation initiated by:  GRAVES-BIGELOW,Layton Naves  Subjective/Objective Assessment:   Pt in for ICD. No needs from CM at this time. Plan for d/c today.     Action/Plan:   Anticipated DC Date:  12/12/2011   Anticipated DC Plan:  HOME/SELF CARE      DC Planning Services  CM consult      Choice offered to / List presented to:             Status of service:  Completed, signed off Medicare Important Message given?   (If response is "NO", the following Medicare IM given date fields will be blank) Date Medicare IM given:   Date Additional Medicare IM given:    Discharge Disposition:  HOME/SELF CARE  Per UR Regulation:  Reviewed for med. necessity/level of care/duration of stay  If discussed at Long Length of Stay Meetings, dates discussed:    Comments:

## 2011-12-12 NOTE — Discharge Instructions (Signed)
   Supplemental Discharge Instructions for  Pacemaker/Defibrillator Patients  Activity No heavy lifting or vigorous activity with your left/right arm for 6 to 8 weeks.  Do not raise your left/right arm above your head for one week.  Gradually raise your affected arm as drawn below.           06/15                       06/16                       06/17                      06/18       NO DRIVING for 1 week; you may begin driving on 12/19/2011. WOUND CARE   Keep the wound area clean and dry.  Do not get this area wet for one week. No showers for one week; you may shower on 12/19/2011.   The tape/steri-strips on your wound will fall off; do not pull them off.  No bandage is needed on the site.  DO  NOT apply any creams, oils, or ointments to the wound area.   If you notice any drainage or discharge from the wound, any swelling or bruising at the site, or you develop a fever > 101? F after you are discharged home, call the office at once.  Special Instructions   You are still able to use cellular telephones; use the ear opposite the side where you have your pacemaker/defibrillator.  Avoid carrying your cellular phone near your device.   When traveling through airports, show security personnel your identification card to avoid being screened in the metal detectors.  Ask the security personnel to use the hand wand.   Avoid arc welding equipment, MRI testing (magnetic resonance imaging), TENS units (transcutaneous nerve stimulators).  Call the office for questions about other devices.   Avoid electrical appliances that are in poor condition or are not properly grounded.   Microwave ovens are safe to be near or to operate.  Additional information for defibrillator patients should your device go off:   If your device goes off ONCE and you feel fine afterward, notify the device clinic nurses.   If your device goes off ONCE and you do not feel well afterward, call 911.   If your device goes off  TWICE, call 911.   If your device goes off THREE times in one day, call 911.  DO NOT DRIVE YOURSELF OR A FAMILY MEMBER WITH A DEFIBRILLATOR TO THE HOSPITAL--CALL 911.  

## 2011-12-13 ENCOUNTER — Telehealth: Payer: Self-pay | Admitting: *Deleted

## 2011-12-13 NOTE — Telephone Encounter (Signed)
Pt called in and stated that his icd shock him today.  Reported this to [employer] in the device clinic.  Gunnar Fusi attempted to call pt but had to leave a message.

## 2011-12-19 ENCOUNTER — Telehealth: Payer: Self-pay | Admitting: *Deleted

## 2011-12-19 NOTE — Telephone Encounter (Signed)
Copied from staff messages:  Good morning, I am sending Dr. Graciela Husbands a fmla for James Moon t review. He wants to be called to pick up when completed. The back to work date is high lighted on the form. Thank you, Elease Hashimoto  The patient called this morning for his paperwork. This has been completed and he is aware. The original was placed at the front desk for pick up and a copy will be given to medical records for scanning.

## 2011-12-23 ENCOUNTER — Other Ambulatory Visit (INDEPENDENT_AMBULATORY_CARE_PROVIDER_SITE_OTHER): Payer: Medicare Other

## 2011-12-23 ENCOUNTER — Ambulatory Visit (INDEPENDENT_AMBULATORY_CARE_PROVIDER_SITE_OTHER): Payer: Medicare Other | Admitting: Cardiovascular Disease

## 2011-12-23 ENCOUNTER — Encounter: Payer: Self-pay | Admitting: Cardiovascular Disease

## 2011-12-23 ENCOUNTER — Encounter: Payer: Self-pay | Admitting: *Deleted

## 2011-12-23 VITALS — BP 105/61 | HR 90 | Ht 70.0 in | Wt 160.8 lb

## 2011-12-23 DIAGNOSIS — I509 Heart failure, unspecified: Secondary | ICD-10-CM

## 2011-12-23 DIAGNOSIS — R0602 Shortness of breath: Secondary | ICD-10-CM

## 2011-12-23 LAB — BASIC METABOLIC PANEL
BUN: 34 mg/dL — ABNORMAL HIGH (ref 6–23)
Chloride: 100 mEq/L (ref 96–112)
GFR: 73.49 mL/min (ref >60.00)
Glucose, Bld: 300 mg/dL — ABNORMAL HIGH (ref 70–99)
Potassium: 5.1 mEq/L (ref 3.5–5.1)

## 2011-12-23 LAB — BRAIN NATRIURETIC PEPTIDE: Pro B Natriuretic peptide (BNP): 250 pg/mL — ABNORMAL HIGH (ref 0.0–100.0)

## 2011-12-23 LAB — HEPATIC FUNCTION PANEL
AST: 24 U/L (ref 0–37)
Albumin: 3.6 g/dL (ref 3.5–5.2)

## 2011-12-23 LAB — LIPID PANEL
Cholesterol: 83 mg/dL (ref 0–200)
VLDL: 12.8 mg/dL (ref 0.0–40.0)

## 2011-12-23 MED ORDER — CARVEDILOL 3.125 MG PO TABS: 3.1250 mg | ORAL_TABLET | Freq: Two times a day (BID) | ORAL | Status: DC

## 2011-12-23 MED ORDER — LISINOPRIL 5 MG PO TABS: 5.0000 mg | ORAL_TABLET | Freq: Two times a day (BID) | ORAL | Status: DC

## 2011-12-23 NOTE — Assessment & Plan Note (Addendum)
James Moon seems to be doing well. He now has an ICD. We'll continue with the standard congestive heart failure medication. He still has some episodes of shortness of breath.  We'll check a BNP today. We are also scheduled to check fasting lipids, the MIP, and hepatic profile.'ll see him again in 6 months for an office visit, EKG, and fasting labs.  He status post ICD placement. His wound looks good. He still has Steri-Strips on.  Operative note under permitting him to return to work on 12/24/2011. He can work without restrictions.

## 2011-12-23 NOTE — Patient Instructions (Addendum)
Your physician recommends that you return for lab work in: today bmet bnp fasting labs  Your physician wants you to follow-up in: 6 months  You will receive a reminder letter in the mail two months in advance. If you don't receive a letter, please call our office to schedule the follow-up appointment.  May return to work 12/24/11

## 2011-12-23 NOTE — Progress Notes (Signed)
James Moon Date of Birth  Dec 10, 1945 [facility]     [place] [facility]  [address]    [address]   [address] [place], [place]  [address]    [place], [place]  [address] [phone]  Fax  [phone]  [phone]  Fax [phone]  Problem List: 1. CAD - CABG 1996, s/p DES 2012 2. CHF - EF 25-30% 3. AICD placement  History of Present Illness:  James Moon is a 66 year old gentleman with a history of coronary artery disease. He status post non-ST segment elevation myocardial infarction in December. He had 2 stents placed. His ejection fraction was noted to be severely depressed at that time. He's been on good medical therapy. He returned for an echocardiogram last week which revealed persistently depressed left systolic function with ejection fraction by my reading of 25-30%. He has anterior and apical akinesis.  He's not had any symptoms of chest pain or shortness breath. He is overall done quite well. 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 He's not had any worsening symptoms. He denies any syncope or presyncope. He denies any PND or orthopnea.  He has seen Dr. Graciela Husbands. An AICD was placed on 12/11/11.     Current Outpatient Prescriptions on File Prior to Visit  Medication Sig Dispense Refill  . aspirin EC 81 MG tablet Take 81 mg by mouth daily.        Marland Kitchen atorvastatin (LIPITOR) 40 MG tablet Take 40 mg by mouth daily.      . carvedilol (COREG) 3.125 MG tablet Take 3.125 mg by mouth 2 (two) times daily with a meal.      . clopidogrel (PLAVIX) 75 MG tablet Take 75 mg by mouth daily.      . Fluticasone-Salmeterol (ADVAIR) 250-50 MCG/DOSE AEPB Inhale 1 puff into the lungs every 12 (twelve) hours.      . insulin NPH-insulin regular (NOVOLIN 70/30) (70-30) 100 UNIT/ML injection Inject 25 Units into the skin 2 (two) times daily with a meal.      . lisinopril (PRINIVIL,ZESTRIL) 5 MG tablet Take 5 mg by mouth 2 (two) times daily.       . nitroGLYCERIN (NITROSTAT) 0.4 MG SL tablet Place 0.4 mg under the tongue every 5 (five) minutes as needed. For chest pain      . spironolactone (ALDACTONE) 25 MG tablet Take 25 mg by mouth daily.      Marland Kitchen tiotropium (SPIRIVA) 18 MCG inhalation capsule Place 18 mcg into inhaler and inhale daily.        No Known Allergies  Past Medical History  Diagnosis Date  . Diabetes mellitus   . Colon cancer     s/p colectomy   . Hypertension   . CAD (coronary artery disease)     CABG in 1996  . NSTEMI (non-ST elevated myocardial infarction) Dec. 11, 2012    DES x 2 to SVG to 1st and 2nd OM and SVG to PDA  . Ischemic cardiomyopathy Dec. 2012    EF is 20 to 25%>30-35% echo 3/13  . Tobacco abuse   . COPD (chronic obstructive pulmonary disease)     Past Surgical History  Procedure Date  . Colon surgery   . Open heart surgery   . Coronary stent placement Dec. 2012    DES to SVG to 1st and 2nd OM and SVG to the PDA    History  Smoking status  . Current  Everyday Smoker -- 0.5 packs/day for 50 years  . Types: Cigarettes  Smokeless tobacco  . Never Used    History  Alcohol Use No    Family History  Problem Relation Age of Onset  . Heart attack Father   . Diabetes Father   . Breast cancer Mother     Reviw of Systems:  Reviewed in the HPI.  All other systems are negative.  Physical Exam: Blood pressure 105/61, pulse 90, height 5\' 10"  (1.778 m), weight 160 lb 12.8 oz (72.938 kg). General: Well developed, well nourished, in no acute distress.  Head: Normocephalic, atraumatic, sclera non-icteric, mucus membranes are moist,   Neck: Supple. Carotids are 2 + without bruits. No JVD  Lungs: Clear bilaterally to auscultation.  He has an ICD in his left subclavian area. It is well healed. There is no hematoma or redness. He still has the Steri-Strips in place.  Heart: regular rate.  normal  S1 S2. There is a soft systolic murmur.  Abdomen: Soft, non-tender, non-distended with normal  bowel sounds. No hepatomegaly. No rebound/guarding. No masses.  Msk:  Strength and tone are normal  Extremities: No clubbing or cyanosis. He has trace ankle edema. There some chronic stasis changes.  Neuro: Alert and oriented X 3. Moves all extremities spontaneously.  Psych:  Responds to questions appropriately with a normal affect.  ECG: 10/02/2011. Sinus bradycardia at 57 beats a minute. He hasn't left axis deviation. There is nonspecific IVCD. He has T-wave inversions in the lateral leads.  Assessment / Plan:

## 2011-12-25 ENCOUNTER — Ambulatory Visit: Payer: Medicare Other

## 2011-12-26 ENCOUNTER — Ambulatory Visit: Payer: Medicare Other

## 2011-12-26 ENCOUNTER — Encounter: Payer: Self-pay | Admitting: Internal Medicine

## 2011-12-26 ENCOUNTER — Ambulatory Visit (INDEPENDENT_AMBULATORY_CARE_PROVIDER_SITE_OTHER): Payer: Medicare Other | Admitting: *Deleted

## 2011-12-26 DIAGNOSIS — I255 Ischemic cardiomyopathy: Secondary | ICD-10-CM

## 2011-12-26 DIAGNOSIS — I2589 Other forms of chronic ischemic heart disease: Secondary | ICD-10-CM

## 2011-12-26 LAB — ICD DEVICE OBSERVATION
DEV-0020ICD: NEGATIVE
HV IMPEDENCE: 66 Ohm
RV LEAD AMPLITUDE: 12 mv
TOT-0010: 4
TZAT-0004FASTVT: 8
TZAT-0012FASTVT: 200 ms
TZAT-0018FASTVT: NEGATIVE
TZAT-0019FASTVT: 7.5 V
TZON-0003FASTVT: 300 ms
TZON-0004FASTVT: 30
TZON-0004SLOWVT: 40
TZON-0005SLOWVT: 6
TZON-0010FASTVT: 40 ms
TZON-0010SLOWVT: 40 ms
TZST-0001FASTVT: 3
TZST-0001FASTVT: 4
TZST-0003FASTVT: 890 V
VF: 0

## 2011-12-26 NOTE — Progress Notes (Signed)
Wound check-ICD 

## 2012-01-06 ENCOUNTER — Ambulatory Visit: Payer: Medicare Other

## 2012-01-10 ENCOUNTER — Other Ambulatory Visit: Payer: Self-pay | Admitting: Physician Assistant

## 2012-01-13 ENCOUNTER — Other Ambulatory Visit: Payer: Self-pay | Admitting: *Deleted

## 2012-01-13 NOTE — Telephone Encounter (Signed)
Opened in Error.

## 2012-03-03 ENCOUNTER — Encounter: Payer: Medicare Other | Admitting: Internal Medicine

## 2012-03-18 ENCOUNTER — Encounter: Payer: Self-pay | Admitting: Internal Medicine

## 2012-03-18 ENCOUNTER — Ambulatory Visit (INDEPENDENT_AMBULATORY_CARE_PROVIDER_SITE_OTHER): Payer: Medicare Other | Admitting: Internal Medicine

## 2012-03-18 VITALS — BP 110/68 | HR 71 | Temp 98.0°F | Wt 160.8 lb

## 2012-03-18 DIAGNOSIS — J449 Chronic obstructive pulmonary disease, unspecified: Secondary | ICD-10-CM

## 2012-03-18 DIAGNOSIS — J9610 Chronic respiratory failure, unspecified whether with hypoxia or hypercapnia: Secondary | ICD-10-CM

## 2012-03-18 NOTE — Progress Notes (Signed)
Subjective:     Patient ID: James Moon, male   DOB: 1945-12-31    MRN: [id number]  HPI 70 yowm quit smoking at admit to [facility] 06/07/11 with chf/ihd/copd dx by pft's at FEV1 35% 06/2011 up to 50% 10/22/2011   06/26/2011 hosp  f/u ov/Wert cc no sob on 02 24h per day, not smoking, resuming nl desired activities with no limitations so far. No cough rec Ok to use the 02  And the nebulizer as needed for now When your advair runs out, change to Advair 250 twice daily  until your visit    10/22/2011 f/u ov/Wert still smoking cc doe x [employer] walking but overall much better and no cough or need for daytime saba at all while on advair and spiriva.  No overt hb or sinus complaints. No longer using 02 or neb at all daytime, no overt hb or sinus complaints or variability to doe. rec No change in your lung medications Completely stop smoking before smoking completely stops you      03/18/2012 f/u ov/Wert cc cough worse x one month, stopped smoking and cough resolved to his satisfaction on just advair twice daily and off spiriva and no worse, no longer using 02 at hs, not limited by sob. No obvious daytime variabilty or cp or chest tightness, subjective wheeze overt sinus or hb symptoms. No unusual exp hx      Sleeping ok without nocturnal  or early am exacerbation  of respiratory  c/o's or need for noct saba. Also denies any obvious fluctuation of symptoms with weather or environmental changes or other aggravating or alleviating factors except as outlined above   ROS  At present neg for  any significant sore throat, dysphagia, itching, sneezing,  nasal congestion or excess/ purulent secretions,  fever, chills, sweats, unintended wt loss, pleuritic or exertional cp, hempoptysis, orthopnea pnd or leg swelling.  Also denies presyncope, palpitations, heartburn, abdominal pain, nausea, vomiting, diarrhea  or change in bowel or urinary habits, dysuria,hematuria,  rash, arthralgias, visual complaints, headache,  numbness weakness or ataxia.         Objective:   Physical Exam    pleasant amb wm nad Wt  06/26/2011  158 > 07/24/2011  161 > 10/22/2011  162 > 03/18/2012  160 HEENT mild turbinate edema.  Oropharynx no thrush or excess pnd or cobblestoning.  No JVD or cervical adenopathy. Mild accessory muscle hypertrophy. Trachea midline, nl thryroid. Chest was hyperinflated by percussion with diminished breath sounds and moderate increased exp time without wheeze. Hoover sign positive at end  inspiration. Regular rate and rhythm without murmur gallop or rub or increase P2 or edema.  Abd: no hsm, nl excursion. Ext warm without cyanosis or clubbing.    CXR  10/22/2011 :  No acute cardiopulmonary abnormality.     Assessment:          Plan:

## 2012-03-18 NOTE — Patient Instructions (Addendum)
Please see patient coordinator before you leave today  to schedule overnight 02 on Room air to see if it's ok to stop 02  Reduce the advair to one puff each am to see if it makes any difference now that you're not smoking(congratulations!)  Please schedule a follow up visit in 3 months but call sooner if needed

## 2012-03-19 NOTE — Assessment & Plan Note (Signed)
-   PFT's 06/12/11 FEV1  1.20 (35%) ratio is 50   DLC0  110%   - PFT's 10/22/2011 FEV11.56 (50%) ratio is 47  And no better with B2 and DLCO 58% corrects to 69%   - 06/26/2011  Walked RA x 3 laps @ 185 ft each stopped due to  End of study, sats 88%   Adequate control on present rx, reviewed.  I reviewed the Flethcher curve with patient that basically indicates  if you quit smoking when your best day FEV1 is still well preserved(as his is) it is highly unlikely you will progress to severe disease and informed the patient there was no medication on the market that has proven to change the curve or the likelihood of progression.  Therefore stopping smoking and maintaining abstinence is the most important aspect of care, not choice of inhalers or for that matter, doctors.      Each maintenance medication was reviewed in detail including most importantly the difference between maintenance and as needed and under what circumstances the prns are to be used.  Please see instructions for details which were reviewed in writing and the patient given a copy.

## 2012-03-31 ENCOUNTER — Encounter: Payer: Self-pay | Admitting: Internal Medicine

## 2012-03-31 ENCOUNTER — Ambulatory Visit (INDEPENDENT_AMBULATORY_CARE_PROVIDER_SITE_OTHER): Payer: Medicare Other | Admitting: Internal Medicine

## 2012-03-31 VITALS — BP 95/56 | HR 55 | Ht 70.0 in | Wt 160.4 lb

## 2012-03-31 DIAGNOSIS — I255 Ischemic cardiomyopathy: Secondary | ICD-10-CM

## 2012-03-31 DIAGNOSIS — I509 Heart failure, unspecified: Secondary | ICD-10-CM

## 2012-03-31 DIAGNOSIS — I2589 Other forms of chronic ischemic heart disease: Secondary | ICD-10-CM

## 2012-03-31 DIAGNOSIS — Z95810 Presence of automatic (implantable) cardiac defibrillator: Secondary | ICD-10-CM

## 2012-03-31 LAB — ICD DEVICE OBSERVATION
BRDY-0002RV: 40 {beats}/min
DEV-0020ICD: NEGATIVE
DEVICE MODEL ICD: 827666
RV LEAD AMPLITUDE: 12 mv
TZAT-0001FASTVT: 1
TZAT-0020FASTVT: 1 ms
TZON-0003SLOWVT: 350 ms
TZON-0004SLOWVT: 40
TZON-0005FASTVT: 6
TZON-0005SLOWVT: 6
TZST-0001FASTVT: 2
TZST-0003FASTVT: 845 V
VENTRICULAR PACING ICD: 1 pct

## 2012-03-31 NOTE — Patient Instructions (Addendum)
Your physician recommends that you schedule a follow-up appointment in: 3 months with device clinic  Your physician wants you to follow-up in: 6 months with Dr Graciela Husbands.  You will receive a reminder letter in the mail two months in advance. If you don't receive a letter, please call our office to schedule the follow-up appointment.   Your physician recommends that you schedule a follow-up appointment in: 3 months with Dr. Elease Hashimoto.

## 2012-03-31 NOTE — Assessment & Plan Note (Signed)
The patient's device was interrogated.  The information was reviewed.  Output reprogrammed to maximize longevity

## 2012-03-31 NOTE — Progress Notes (Signed)
Patient Care Team: Hollice Espy, MD as PCP - General (Family Medicine)   HPI  James Moon is a 66 y.o. male Seen in followup for ICD implanted June 2013 for primary prevention in the setting of ischemic heart disease with prior bypass surgery and depressed left ventricular function.  He has stopped smoking. He feels better. He's had no significant chest pain or shortness of breath.  Lipids were checked 2; his LDL was 31  Past Medical History  Diagnosis Date  . Diabetes mellitus   . Colon cancer     s/p colectomy   . Hypertension   . CAD (coronary artery disease)     CABG in 1996  . NSTEMI (non-ST elevated myocardial infarction) Dec. 11, 2012    DES x 2 to SVG to 1st and 2nd OM and SVG to PDA  . Ischemic cardiomyopathy Dec. 2012    EF is 20 to 25%>30-35% echo 3/13  . Tobacco abuse   . COPD (chronic obstructive pulmonary disease)     Past Surgical History  Procedure Date  . Colon surgery   . Open heart surgery   . Coronary stent placement Dec. 2012    DES to SVG to 1st and 2nd OM and SVG to the PDA    Current Outpatient Prescriptions  Medication Sig Dispense Refill  . aspirin EC 81 MG tablet Take 81 mg by mouth daily.        Marland Kitchen atorvastatin (LIPITOR) 40 MG tablet Take 40 mg by mouth daily.      . carvedilol (COREG) 3.125 MG tablet Take 1 tablet (3.125 mg total) by mouth 2 (two) times daily with a meal.  180 tablet  1  . clopidogrel (PLAVIX) 75 MG tablet Take 75 mg by mouth daily.      . Fluticasone-Salmeterol (ADVAIR) 250-50 MCG/DOSE AEPB Inhale 1 puff into the lungs daily.       . furosemide (LASIX) 40 MG tablet TAKE ONE TABLET BY MOUTH DAILY  90 tablet  1  . insulin NPH-insulin regular (NOVOLIN 70/30) (70-30) 100 UNIT/ML injection Inject 25 Units into the skin 2 (two) times daily with a meal. 20units in the am and 25units in the pm      . lisinopril (PRINIVIL,ZESTRIL) 5 MG tablet Take 1 tablet (5 mg total) by mouth 2 (two) times daily.  180 tablet  1  .  nitroGLYCERIN (NITROSTAT) 0.4 MG SL tablet Place 0.4 mg under the tongue every 5 (five) minutes as needed. For chest pain      . spironolactone (ALDACTONE) 25 MG tablet Take 25 mg by mouth daily.        No Known Allergies  Review of Systems negative except from HPI and PMH  Physical Exam BP 95/56  Pulse 55  Ht 5\' 10"  (1.778 m)  Wt 160 lb 6.4 oz (72.757 kg)  BMI 23.02 kg/m2  SpO2 96% Well developed and well nourished in no acute distress HENT normal E scleral and icterus clear Neck Supple JVP flat; carotids brisk and full Clear to ausculation Device pocket well healed; without hematoma or erythema Mild atophy Regular rate and rhythm, no murmurs gallops or rub Soft with active bowel sounds No clubbing cyanosis Trace  Edema Alert and oriented, grossly normal motor and sensory function Skin Warm and Dry  Electrocardiogram demonstrates sinus rhythm at 50 Intervals 1412/44  axis is leftward at -64 T wave inversions anterolaterally these have been variable over the last year or 2  Assessment and  Plan

## 2012-03-31 NOTE — Assessment & Plan Note (Signed)
Stable class II symptoms 

## 2012-04-17 ENCOUNTER — Encounter: Payer: Self-pay | Admitting: Internal Medicine

## 2012-04-30 ENCOUNTER — Other Ambulatory Visit: Payer: Self-pay | Admitting: Cardiovascular Disease

## 2012-04-30 NOTE — Telephone Encounter (Signed)
Fax Received. Refill Completed. James Moon (R.M.A)   

## 2012-06-03 ENCOUNTER — Other Ambulatory Visit: Payer: Self-pay | Admitting: *Deleted

## 2012-06-03 MED ORDER — CLOPIDOGREL BISULFATE 75 MG PO TABS: 75.0000 mg | ORAL_TABLET | Freq: Every day | ORAL | Status: DC

## 2012-06-03 NOTE — Telephone Encounter (Signed)
Fax Received. Refill Completed. James Moon (R.M.A)   

## 2012-06-17 ENCOUNTER — Encounter: Payer: Self-pay | Admitting: Cardiovascular Disease

## 2012-06-17 ENCOUNTER — Ambulatory Visit (INDEPENDENT_AMBULATORY_CARE_PROVIDER_SITE_OTHER): Payer: Medicare Other | Admitting: *Deleted

## 2012-06-17 ENCOUNTER — Ambulatory Visit (INDEPENDENT_AMBULATORY_CARE_PROVIDER_SITE_OTHER): Payer: Medicare Other | Admitting: Cardiovascular Disease

## 2012-06-17 ENCOUNTER — Encounter: Payer: Self-pay | Admitting: Internal Medicine

## 2012-06-17 VITALS — BP 102/60 | HR 81 | Ht 70.0 in | Wt 152.0 lb

## 2012-06-17 DIAGNOSIS — I2510 Atherosclerotic heart disease of native coronary artery without angina pectoris: Secondary | ICD-10-CM

## 2012-06-17 DIAGNOSIS — I255 Ischemic cardiomyopathy: Secondary | ICD-10-CM

## 2012-06-17 DIAGNOSIS — I509 Heart failure, unspecified: Secondary | ICD-10-CM

## 2012-06-17 DIAGNOSIS — I2589 Other forms of chronic ischemic heart disease: Secondary | ICD-10-CM

## 2012-06-17 LAB — ICD DEVICE OBSERVATION
HV IMPEDENCE: 74 Ohm
PACEART VT: 0
RV LEAD IMPEDENCE ICD: 400 Ohm
RV LEAD THRESHOLD: 0.75 V
TOT-0009: 0
TZAT-0001FASTVT: 1
TZAT-0004FASTVT: 8
TZAT-0019FASTVT: 7.5 V
TZAT-0020FASTVT: 1 ms
TZON-0003SLOWVT: 350 ms
TZON-0004FASTVT: 30
TZON-0004SLOWVT: 40
TZON-0005FASTVT: 6
TZON-0005SLOWVT: 6
TZON-0010FASTVT: 40 ms
TZST-0003FASTVT: 845 V
VF: 0

## 2012-06-17 NOTE — Patient Instructions (Addendum)
Your physician wants you to follow-up in: 6 months.   You will receive a reminder letter in the mail two months in advance. If you don't receive a letter, please call our office to schedule the follow-up appointment.  Your physician recommends that you return for lab work in: at your next appt. (fasting).  (lipid, liver, bmet).

## 2012-06-17 NOTE — Progress Notes (Signed)
James Moon Date of Birth  August 25, 1945 [place]     [place] [facility]  [address]    [address]   [address] [place], [place]  [address]    [place], [place]  [address] [phone]  Fax  [phone]  [phone]  Fax [phone]  Problem List: 1. CAD - CABG 1996, s/p DES 2012 2. CHF - EF 25-30% 3. AICD placement  History of Present Illness:  James Moon is a 66 year old gentleman with a history of coronary artery disease. He status post non-ST segment elevation myocardial infarction in December. He had 2 stents placed. His ejection fraction was noted to be severely depressed at that time. He's been on good medical therapy. He returned for an echocardiogram last week which revealed persistently depressed left systolic function with ejection fraction by my reading of 25-30%. He has anterior and apical akinesis.  He's not had any symptoms of chest pain or shortness breath. He is overall done quite well. 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 He's not had any worsening symptoms. He denies any syncope or presyncope. He denies any PND or orthopnea.  He has seen Dr. Graciela Husbands. An AICD was placed on 12/11/11.    Dec. 18, 2013  James Moon is a 66 yo with the above noted hx.  He has developed a runny nose and cough - I suspect it is a a viral illness.  He still smokes. He he denies any chest pain or shortness of breath. He still works 4 days week as a International aid/development worker.  Current Outpatient Prescriptions on File Prior to Visit  Medication Sig Dispense Refill  . aspirin EC 81 MG tablet Take 81 mg by mouth daily.        Marland Kitchen atorvastatin (LIPITOR) 40 MG tablet Take 40 mg by mouth daily.      . carvedilol (COREG) 3.125 MG tablet Take 1 tablet (3.125 mg total) by mouth 2 (two) times daily with a meal.  180 tablet  1  . clopidogrel (PLAVIX) 75 MG tablet Take 1 tablet (75 mg total) by mouth daily.  30 tablet  4  . Fluticasone-Salmeterol (ADVAIR)  250-50 MCG/DOSE AEPB Inhale 1 puff into the lungs daily.       . furosemide (LASIX) 40 MG tablet TAKE ONE TABLET BY MOUTH DAILY  90 tablet  1  . insulin NPH-insulin regular (NOVOLIN 70/30) (70-30) 100 UNIT/ML injection Inject 25 Units into the skin 2 (two) times daily with a meal. 20units in the am and 25units in the pm      . lisinopril (PRINIVIL,ZESTRIL) 5 MG tablet Take 1 tablet (5 mg total) by mouth 2 (two) times daily.  180 tablet  1  . nitroGLYCERIN (NITROSTAT) 0.4 MG SL tablet Place 0.4 mg under the tongue every 5 (five) minutes as needed. For chest pain      . spironolactone (ALDACTONE) 25 MG tablet Take 25 mg by mouth daily.        No Known Allergies  Past Medical History  Diagnosis Date  . Diabetes mellitus   . Colon cancer     s/p colectomy   . Hypertension   . CAD (coronary artery disease)     CABG in 1996  . NSTEMI (non-ST elevated myocardial infarction) Dec. 11, 2012    DES x 2 to SVG to 1st and 2nd OM and SVG to PDA  . Ischemic cardiomyopathy Dec. 2012  EF is 20 to 25%>30-35% echo 3/13  . Tobacco abuse   . COPD (chronic obstructive pulmonary disease)     Past Surgical History  Procedure Date  . Colon surgery   . Open heart surgery   . Coronary stent placement Dec. 2012    DES to SVG to 1st and 2nd OM and SVG to the PDA    History  Smoking status  . Former Smoker -- 0.5 packs/day for 50 years  . Types: Cigarettes  . Start date: 02/26/2012  Smokeless tobacco  . Never Used    History  Alcohol Use No    Family History  Problem Relation Age of Onset  . Heart attack Father   . Diabetes Father   . Breast cancer Mother     Reviw of Systems:  Reviewed in the HPI.  All other systems are negative.  Physical Exam: Blood pressure 102/60, pulse 81, height 5\' 10"  (1.778 m), weight 152 lb (68.947 kg), SpO2 93.00%. General: Well developed, well nourished, in no acute distress.  Head: Normocephalic, atraumatic, sclera non-icteric, mucus membranes are moist,    Neck: Supple. Carotids are 2 + without bruits. No JVD  Lungs: Clear bilaterally to auscultation.  He has an ICD in his left subclavian area. It is well healed. There is no hematoma or redness. He still has the Steri-Strips in place.  Heart: regular rate.  normal  S1 S2. There is a soft systolic murmur.  Abdomen: Soft, non-tender, non-distended with normal bowel sounds. No hepatomegaly. No rebound/guarding. No masses.  Msk:  Strength and tone are normal  Extremities: No clubbing or cyanosis. He has trace ankle edema. There some chronic stasis changes.  Neuro: Alert and oriented X 3. Moves all extremities spontaneously.  Psych:  Responds to questions appropriately with a normal affect.  ECG: 10/02/2011. Sinus bradycardia at 57 beats a minute. He hasn't left axis deviation. There is nonspecific IVCD. He has T-wave inversions in the lateral leads.  Assessment / Plan:

## 2012-06-17 NOTE — Assessment & Plan Note (Addendum)
Teoman has done well. He's not had any episodes of chest pain. We'll continue with the same medications.

## 2012-06-17 NOTE — Progress Notes (Signed)
ICD check by industry for research 

## 2012-06-17 NOTE — Assessment & Plan Note (Signed)
His ejection fraction is 20-25%. We'll continue with medical therapy. At this point his blood pressure is rather low. I do not think it we can necessarily increase his carvedilol although I would like to try to get his dose up a little higher. We'll continue to reassess this. We have discontinued his Aldactone since he was unable to take it anyway. He had developed some hyperkalemia.

## 2012-06-18 ENCOUNTER — Other Ambulatory Visit: Payer: Self-pay | Admitting: Cardiovascular Disease

## 2012-06-18 MED ORDER — LISINOPRIL 5 MG PO TABS: 5.0000 mg | ORAL_TABLET | Freq: Two times a day (BID) | ORAL | Status: DC

## 2012-06-22 ENCOUNTER — Other Ambulatory Visit: Payer: Self-pay

## 2012-06-22 MED ORDER — ATORVASTATIN CALCIUM 40 MG PO TABS: 40.0000 mg | ORAL_TABLET | Freq: Every day | ORAL | Status: DC

## 2012-07-20 ENCOUNTER — Other Ambulatory Visit: Payer: Self-pay | Admitting: Cardiovascular Disease

## 2012-07-20 MED ORDER — FUROSEMIDE 40 MG PO TABS: 40.0000 mg | ORAL_TABLET | Freq: Every day | ORAL | Status: DC

## 2012-07-22 ENCOUNTER — Other Ambulatory Visit: Payer: Self-pay | Admitting: *Deleted

## 2012-07-22 NOTE — Telephone Encounter (Signed)
Opened in Error.

## 2012-08-28 ENCOUNTER — Encounter (INDEPENDENT_AMBULATORY_CARE_PROVIDER_SITE_OTHER): Payer: Medicare Other

## 2012-08-28 ENCOUNTER — Other Ambulatory Visit: Payer: Self-pay

## 2012-08-28 ENCOUNTER — Encounter: Payer: Self-pay | Admitting: Internal Medicine

## 2012-08-28 ENCOUNTER — Ambulatory Visit (INDEPENDENT_AMBULATORY_CARE_PROVIDER_SITE_OTHER): Payer: Medicare Other | Admitting: *Deleted

## 2012-08-28 DIAGNOSIS — R0989 Other specified symptoms and signs involving the circulatory and respiratory systems: Secondary | ICD-10-CM

## 2012-08-28 DIAGNOSIS — I2589 Other forms of chronic ischemic heart disease: Secondary | ICD-10-CM

## 2012-08-28 DIAGNOSIS — I255 Ischemic cardiomyopathy: Secondary | ICD-10-CM

## 2012-08-28 DIAGNOSIS — I509 Heart failure, unspecified: Secondary | ICD-10-CM

## 2012-08-28 LAB — ICD DEVICE OBSERVATION
RV LEAD AMPLITUDE: 12 mv
RV LEAD IMPEDENCE ICD: 400 Ohm
TZAT-0012FASTVT: 200 ms
TZAT-0013FASTVT: 1
TZAT-0018FASTVT: NEGATIVE
TZAT-0020FASTVT: 1 ms
TZON-0003FASTVT: 300 ms
TZON-0003SLOWVT: 350 ms
TZON-0005FASTVT: 6
TZON-0010SLOWVT: 40 ms
TZST-0001FASTVT: 2
TZST-0001FASTVT: 3
TZST-0001FASTVT: 4
TZST-0003FASTVT: 890 V
VENTRICULAR PACING ICD: 1 pct

## 2012-08-28 NOTE — Progress Notes (Signed)
ICD check by industry for research 

## 2012-09-10 ENCOUNTER — Other Ambulatory Visit: Payer: Self-pay | Admitting: Internal Medicine

## 2012-10-21 ENCOUNTER — Other Ambulatory Visit: Payer: Self-pay | Admitting: *Deleted

## 2012-10-21 MED ORDER — CARVEDILOL 3.125 MG PO TABS: 3.1250 mg | ORAL_TABLET | Freq: Two times a day (BID) | ORAL | Status: DC

## 2012-10-21 NOTE — Telephone Encounter (Signed)
Fax Received. Refill Completed. Trenyce Loera Chowoe (R.M.A)   

## 2012-11-03 ENCOUNTER — Telehealth: Payer: Self-pay | Admitting: Cardiovascular Disease

## 2012-11-03 NOTE — Telephone Encounter (Signed)
Labs faxed

## 2012-11-03 NOTE — Telephone Encounter (Signed)
New problem   James Moon/[employer] need 12/23/2011 lipid panel results fax to [phone].

## 2012-11-04 ENCOUNTER — Telehealth: Payer: Self-pay | Admitting: Cardiovascular Disease

## 2012-11-04 NOTE — Telephone Encounter (Signed)
Received request from Nurse.   To: [facility] [place] Fax number: [phone] Attention:Donna Rothell Lipids Faxed  11/04/12/KM

## 2012-12-09 ENCOUNTER — Ambulatory Visit (INDEPENDENT_AMBULATORY_CARE_PROVIDER_SITE_OTHER): Payer: Medicare Other | Admitting: Internal Medicine

## 2012-12-09 ENCOUNTER — Encounter: Payer: Self-pay | Admitting: Internal Medicine

## 2012-12-09 VITALS — BP 94/49 | HR 62 | Ht 70.0 in | Wt 150.0 lb

## 2012-12-09 DIAGNOSIS — I2589 Other forms of chronic ischemic heart disease: Secondary | ICD-10-CM

## 2012-12-09 DIAGNOSIS — I2510 Atherosclerotic heart disease of native coronary artery without angina pectoris: Secondary | ICD-10-CM

## 2012-12-09 DIAGNOSIS — Z95810 Presence of automatic (implantable) cardiac defibrillator: Secondary | ICD-10-CM

## 2012-12-09 DIAGNOSIS — I5022 Chronic systolic (congestive) heart failure: Secondary | ICD-10-CM

## 2012-12-09 DIAGNOSIS — R609 Edema, unspecified: Secondary | ICD-10-CM

## 2012-12-09 DIAGNOSIS — I255 Ischemic cardiomyopathy: Secondary | ICD-10-CM

## 2012-12-09 LAB — BASIC METABOLIC PANEL
CO2: 25 mEq/L (ref 19–32)
Calcium: 9.2 mg/dL (ref 8.4–10.5)
GFR: 68.1 mL/min (ref >60.00)
Glucose, Bld: 107 mg/dL — ABNORMAL HIGH (ref 70–99)
Potassium: 5 mEq/L (ref 3.5–5.1)
Sodium: 140 mEq/L (ref 135–145)

## 2012-12-09 LAB — ICD DEVICE OBSERVATION
DEV-0020ICD: NEGATIVE
DEVICE MODEL ICD: 827666
FVT: 0
PACEART VT: 0
RV LEAD IMPEDENCE ICD: 375 Ohm
TOT-0007: 0
TOT-0008: 0
TOT-0009: 0
TZAT-0004FASTVT: 8
TZAT-0012FASTVT: 200 ms
TZAT-0018FASTVT: NEGATIVE
TZAT-0019FASTVT: 7.5 V
TZON-0003FASTVT: 300 ms
TZON-0004FASTVT: 30
TZON-0004SLOWVT: 40
TZON-0005SLOWVT: 6
TZON-0010SLOWVT: 40 ms
TZST-0001FASTVT: 3
TZST-0001FASTVT: 4
TZST-0003FASTVT: 890 V
VENTRICULAR PACING ICD: 0.01 pct

## 2012-12-09 LAB — CBC WITH DIFFERENTIAL/PLATELET
Basophils Relative: 0.8 % (ref 0.0–3.0)
Eosinophils Relative: 4.3 % (ref 0.0–5.0)
Hemoglobin: 14.5 g/dL (ref 13.0–17.0)
Lymphocytes Relative: 17.6 % (ref 12.0–46.0)
MCHC: 32.1 g/dL (ref 30.0–36.0)
Monocytes Relative: 9.3 % (ref 3.0–12.0)
Neutro Abs: 4.4 10*3/uL (ref 1.4–7.7)
Neutrophils Relative %: 68 % (ref 43.0–77.0)
RBC: 4.69 Mil/uL (ref 4.22–5.81)
WBC: 6.5 10*3/uL (ref 4.5–10.5)

## 2012-12-09 MED ORDER — FUROSEMIDE 40 MG PO TABS: ORAL_TABLET | ORAL | Status: DC

## 2012-12-09 NOTE — Assessment & Plan Note (Signed)
The patient's device was interrogated.  The information was reviewed. No changes were made in the programming.    

## 2012-12-09 NOTE — Progress Notes (Signed)
Patient Care Team: Hollice Espy, MD as PCP - General (Family Medicine)   HPI  James Moon is a 67 y.o. male seen in followup for ICD implanted June 2013 for primary prevention in the setting of ischemic heart disease with prior bypass surgery and depressed left ventricular function.  He has stopped smoking. Again  Now not for 30 days He feels better. He's had no significant chest pain or shortness of breath. He has however noted significant peripheral edema.  We have reviewed his diet. He is maybe prepared foods including TV dinners. He is unaware of sodium intake. He is also concerned that he feels like he is losing weight. His caloric intake he think is reasonable    Past Medical History  Diagnosis Date  . Diabetes mellitus   . Colon cancer     s/p colectomy   . Hypertension   . CAD (coronary artery disease)     CABG in 1996  . NSTEMI (non-ST elevated myocardial infarction) Dec. 11, 2012    DES x 2 to SVG to 1st and 2nd OM and SVG to PDA  . Ischemic cardiomyopathy Dec. 2012    EF is 20 to 25%>30-35% echo 3/13  . Tobacco abuse   . COPD (chronic obstructive pulmonary disease)     Past Surgical History  Procedure Laterality Date  . Colon surgery    . Open heart surgery    . Coronary stent placement  Dec. 2012    DES to SVG to 1st and 2nd OM and SVG to the PDA    Current Outpatient Prescriptions  Medication Sig Dispense Refill  . ADVAIR DISKUS 250-50 MCG/DOSE AEPB INHALE ONE DOSE BY MOUTH TWICE DAILY  60 each  0  . aspirin EC 81 MG tablet Take 81 mg by mouth daily.        Marland Kitchen atorvastatin (LIPITOR) 40 MG tablet Take 1 tablet (40 mg total) by mouth daily.  90 tablet  3  . carvedilol (COREG) 3.125 MG tablet Take 1 tablet (3.125 mg total) by mouth 2 (two) times daily with a meal.  180 tablet  3  . clopidogrel (PLAVIX) 75 MG tablet Take 1 tablet (75 mg total) by mouth daily.  30 tablet  4  . Fluticasone-Salmeterol (ADVAIR) 250-50 MCG/DOSE AEPB Inhale 1 puff into the  lungs daily.       . furosemide (LASIX) 40 MG tablet Take 1 tablet (40 mg total) by mouth daily.  90 tablet  1  . insulin NPH-insulin regular (NOVOLIN 70/30) (70-30) 100 UNIT/ML injection 20units in the am and 20units in the pm      . lisinopril (PRINIVIL,ZESTRIL) 5 MG tablet Take 1 tablet (5 mg total) by mouth 2 (two) times daily.  180 tablet  3  . nitroGLYCERIN (NITROSTAT) 0.4 MG SL tablet Place 0.4 mg under the tongue every 5 (five) minutes as needed. For chest pain       No current facility-administered medications for this visit.    No Known Allergies  Review of Systems negative except from HPI and PMH  Physical Exam BP 94/49  Pulse 62  Ht 5\' 10"  (1.778 m)  Wt 150 lb (68.04 kg)  BMI 21.52 kg/m2 Well developed and well nourished in no acute distress HENT normal E scleral and icterus clear Neck Supple JVP 8-10; carotids brisk and full Clear to ausculation   Regular rate and rhythm, 2/6 ys m  Soft with active bowel sounds No clubbing cyanosis 3+ Edema Alert and  oriented, grossly normal motor and sensory function Skin Warm and Dry    Assessment and  Plan

## 2012-12-09 NOTE — Assessment & Plan Note (Signed)
He has significant bilateral edema. We'll increase his Lasix from 40 daily to 40 alternating with 80. I reviewed with him also the importance of being aware and decreasing his sodium intake. We will be seeing Dr. Elease Hashimoto in 2 weeks time. We will plan at this point to check a metabolic profile to make sure there is no changes in renal function which may be contributing. He is also felt like there has been weight loss not withstanding his edema and not withstanding his caloric intake. We'll check a TSH and a CBC.

## 2012-12-09 NOTE — Assessment & Plan Note (Signed)
Continue current medications. Blood pressure precludes further up titration of the addition of Aldactone. He does not have significant dizziness so will not adjust his medications downward

## 2012-12-09 NOTE — Patient Instructions (Addendum)
Your physician has recommended you make the following change in your medication: take 40 mg of Furosemide one day then alternate 80 mg the next day until follow up with Dr Elease Hashimoto on 6/25  Your physician recommends that you return for lab work in: today  Your physician wants you to follow-up in: 12 months. You will receive a reminder letter in the mail two months in advance. If you don't receive a letter, please call our office to schedule the follow-up appointment.

## 2012-12-09 NOTE — Assessment & Plan Note (Signed)
Without symptoms of angina continue current meds

## 2012-12-09 NOTE — Assessment & Plan Note (Signed)
As above.

## 2012-12-23 ENCOUNTER — Telehealth: Payer: Self-pay | Admitting: *Deleted

## 2012-12-23 ENCOUNTER — Encounter: Payer: Self-pay | Admitting: Cardiovascular Disease

## 2012-12-23 ENCOUNTER — Ambulatory Visit (INDEPENDENT_AMBULATORY_CARE_PROVIDER_SITE_OTHER): Payer: Medicare Other | Admitting: Cardiovascular Disease

## 2012-12-23 VITALS — BP 110/60 | HR 64 | Ht 70.0 in | Wt 142.0 lb

## 2012-12-23 DIAGNOSIS — I5022 Chronic systolic (congestive) heart failure: Secondary | ICD-10-CM

## 2012-12-23 DIAGNOSIS — J4489 Other specified chronic obstructive pulmonary disease: Secondary | ICD-10-CM

## 2012-12-23 DIAGNOSIS — I2510 Atherosclerotic heart disease of native coronary artery without angina pectoris: Secondary | ICD-10-CM

## 2012-12-23 DIAGNOSIS — E875 Hyperkalemia: Secondary | ICD-10-CM

## 2012-12-23 DIAGNOSIS — J449 Chronic obstructive pulmonary disease, unspecified: Secondary | ICD-10-CM

## 2012-12-23 DIAGNOSIS — I509 Heart failure, unspecified: Secondary | ICD-10-CM

## 2012-12-23 LAB — BASIC METABOLIC PANEL
BUN: 57 mg/dL — ABNORMAL HIGH (ref 6–23)
BUN: 56 mg/dL — ABNORMAL HIGH (ref 6–23)
CO2: 30 mEq/L (ref 19–32)
CO2: 28 mEq/L (ref 19–32)
Calcium: 9.6 mg/dL (ref 8.4–10.5)
Chloride: 100 mEq/L (ref 96–112)
Creatinine, Ser: 1.5 mg/dL (ref 0.4–1.5)
Creatinine, Ser: 1.4 mg/dL (ref 0.4–1.5)
GFR: 52.85 mL/min — ABNORMAL LOW (ref >60.00)
Glucose, Bld: 84 mg/dL (ref 70–99)
Glucose, Bld: 165 mg/dL — ABNORMAL HIGH (ref 70–99)
Potassium: 6.5 mEq/L (ref 3.5–5.1)
Sodium: 136 mEq/L (ref 135–145)

## 2012-12-23 NOTE — Assessment & Plan Note (Signed)
We discussed the fact that he should stop smoking.  He was surprised that he had emphysema.  Marland Kitchen

## 2012-12-23 NOTE — Assessment & Plan Note (Signed)
Pt seems to be stable months.   He needs to make sure that he is avoiding salt. He has had some leg edema which resolved with the increased dose of Lasix but this also caused him to have some orthostatic hypotension.  We'll check a basic metabolic profile today since he increased his Lasix several weeks ago.    I have encouraged him once again to stop smoking as this will help prevent his COPD from worsening.  I suspect a lot of his peripheral edema is due to his COPD and subsequent right heart failure.  He should continue the same medications. I'll see him in 6 months.

## 2012-12-23 NOTE — Progress Notes (Signed)
K+ 6.5 PER CALL FROM LAB, DR NAHSER TO BE NOTIFIED.

## 2012-12-23 NOTE — Telephone Encounter (Signed)
James Moon with lab called critical Potassium of 6.5 Message given to Pasty Spillers RN

## 2012-12-23 NOTE — Progress Notes (Signed)
James Moon Date of Birth  1945/11/30 [facility]     [place] [facility]  [address]    [address]   [address] [place], [place]  [address]    [place], [place]  [address] [phone]  Fax  [phone]  [phone]  Fax [phone]  Problem List: 1. CAD - CABG 1996, s/p DES 2012 2. CHF - EF 25-30% 3. AICD placement  History of Present Illness:  James Moon is a 67 year old gentleman with a history of coronary artery disease. He status post non-ST segment elevation myocardial infarction in December. He had 2 stents placed. His ejection fraction was noted to be severely depressed at that time. He's been on good medical therapy. He returned for an echocardiogram last week which revealed persistently depressed left systolic function with ejection fraction by my reading of 25-30%. He has anterior and apical akinesis.  He's not had any symptoms of chest pain or shortness breath. He is overall done quite well. 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 He's not had any worsening symptoms. He denies any syncope or presyncope. He denies any PND or orthopnea.  He has seen Dr. Graciela Husbands. An AICD was placed on 12/11/11.    Dec. 18, 2013  James Moon is a 67 yo with the above noted hx.  He has developed a runny nose and cough - I suspect it is a a viral illness.  He still smokes. He he denies any chest pain or shortness of breath. He still works 4 days week as a International aid/development worker.  December 23, 2012:  He restarted smoking again.  He is active at work and walks occasionally.  He had some leg swelling and Dr.  Graciela Husbands increased his lasix to 40 QD with 80 mg  3 times a week.   He has an AICD.    The leg edema has improved but he is having some episodes of orthostasis.   Current Outpatient Prescriptions on File Prior to Visit  Medication Sig Dispense Refill  . ADVAIR DISKUS 250-50 MCG/DOSE AEPB INHALE ONE DOSE BY MOUTH TWICE DAILY  60 each  0  . aspirin  EC 81 MG tablet Take 81 mg by mouth daily.        Marland Kitchen atorvastatin (LIPITOR) 40 MG tablet Take 1 tablet (40 mg total) by mouth daily.  90 tablet  3  . carvedilol (COREG) 3.125 MG tablet Take 1 tablet (3.125 mg total) by mouth 2 (two) times daily with a meal.  180 tablet  3  . clopidogrel (PLAVIX) 75 MG tablet Take 1 tablet (75 mg total) by mouth daily.  30 tablet  4  . Fluticasone-Salmeterol (ADVAIR) 250-50 MCG/DOSE AEPB Inhale 1 puff into the lungs daily.       . furosemide (LASIX) 40 MG tablet Take 40 mg one day and (alternate) 80 mg the next day X 3 weeks  45 tablet  1  . insulin NPH-insulin regular (NOVOLIN 70/30) (70-30) 100 UNIT/ML injection 20units in the am and 20units in the pm      . lisinopril (PRINIVIL,ZESTRIL) 5 MG tablet Take 1 tablet (5 mg total) by mouth 2 (two) times daily.  180 tablet  3  . nitroGLYCERIN (NITROSTAT) 0.4 MG SL tablet Place 0.4 mg under the tongue every 5 (five) minutes as needed. For chest pain       No current facility-administered medications on file prior to visit.    No  Known Allergies  Past Medical History  Diagnosis Date  . Diabetes mellitus   . Colon cancer     s/p colectomy   . Hypertension   . CAD (coronary artery disease)     CABG in 1996  . NSTEMI (non-ST elevated myocardial infarction) Dec. 11, 2012    DES x 2 to SVG to 1st and 2nd OM and SVG to PDA  . Ischemic cardiomyopathy Dec. 2012    EF is 20 to 25%>30-35% echo 3/13  . Tobacco abuse   . COPD (chronic obstructive pulmonary disease)     Past Surgical History  Procedure Laterality Date  . Colon surgery    . Open heart surgery    . Coronary stent placement  Dec. 2012    DES to SVG to 1st and 2nd OM and SVG to the PDA    History  Smoking status  . Former Smoker -- 0.50 packs/day for 50 years  . Types: Cigarettes  . Start date: 02/26/2012  Smokeless tobacco  . Never Used    History  Alcohol Use No    Family History  Problem Relation Age of Onset  . Heart attack Father    . Diabetes Father   . Breast cancer Mother     Reviw of Systems:  Reviewed in the HPI.  All other systems are negative.  Physical Exam: Blood pressure 110/60, pulse 64, height 5\' 10"  (1.778 m), weight 142 lb (64.411 kg). General: Well developed, well nourished, in no acute distress.  Head: Normocephalic, atraumatic, sclera non-icteric, mucus membranes are moist,   Neck: Supple. Carotids are 2 + without bruits. No JVD  Lungs: Clear bilaterally to auscultation.  He has an ICD in his left subclavian area. It is well healed. There is no hematoma or redness. He still has the Steri-Strips in place.  Heart: regular rate.  normal  S1 S2. There is a soft systolic murmur.  Abdomen: Soft, non-tender, non-distended with normal bowel sounds. No hepatomegaly. No rebound/guarding. No masses.  Msk:  Strength and tone are normal  Extremities: No clubbing or cyanosis. He has trace ankle edema. There some chronic stasis changes.  Neuro: Alert and oriented X 3. Moves all extremities spontaneously.  Psych:  Responds to questions appropriately with a normal affect.  ECG: 10/02/2011. Sinus bradycardia at 57 beats a minute. He hasn't left axis deviation. There is nonspecific IVCD. He has T-wave inversions in the lateral leads.  Assessment / Plan:

## 2012-12-23 NOTE — Addendum Note (Signed)
Addended by: Awilda Bill on: 12/23/2012 02:12 PM   Modules accepted: Orders

## 2012-12-23 NOTE — Telephone Encounter (Signed)
PT HAD ELEVATED K+ TODAY OF 6.5, PT TO COME BACK IN TODAY FOR LAB REDRAW FOR VERIFICATION. PT VERBALIZED UNDERSTANDING.

## 2012-12-23 NOTE — Patient Instructions (Addendum)
Your physician wants you to follow-up in: 6 months  You will receive a reminder letter in the mail two months in advance. If you don't receive a letter, please call our office to schedule the follow-up appointment.  Your physician recommends that you return for lab work in: today/ bmet  Your physician discussed the hazards of tobacco use. Tobacco use cessation is recommended and techniques and options to help you quit were discussed.

## 2012-12-23 NOTE — Telephone Encounter (Signed)
Discussed elevated k+6.6, pt eating a lot of high k+ foods/ reviewed food list to avoid and redraw in 2 weeks, told to drink more water to flush and BUN was elevated also. Pt verbalized understanding and agreed to plan.

## 2013-01-06 ENCOUNTER — Other Ambulatory Visit (INDEPENDENT_AMBULATORY_CARE_PROVIDER_SITE_OTHER): Payer: Medicare Other

## 2013-01-06 DIAGNOSIS — E875 Hyperkalemia: Secondary | ICD-10-CM

## 2013-01-06 LAB — BASIC METABOLIC PANEL
BUN: 38 mg/dL — ABNORMAL HIGH (ref 6–23)
Calcium: 9.1 mg/dL (ref 8.4–10.5)
Chloride: 100 mEq/L (ref 96–112)
Creatinine, Ser: 1.4 mg/dL (ref 0.4–1.5)

## 2013-02-11 ENCOUNTER — Ambulatory Visit (INDEPENDENT_AMBULATORY_CARE_PROVIDER_SITE_OTHER): Payer: Medicare Other | Admitting: *Deleted

## 2013-02-11 DIAGNOSIS — I255 Ischemic cardiomyopathy: Secondary | ICD-10-CM

## 2013-02-11 DIAGNOSIS — I2589 Other forms of chronic ischemic heart disease: Secondary | ICD-10-CM

## 2013-02-11 LAB — ICD DEVICE OBSERVATION
BRDY-0002RV: 40 {beats}/min
FVT: 0
PACEART VT: 0
RV LEAD AMPLITUDE: 12 mv
RV LEAD THRESHOLD: 0.75 V
TOT-0007: 0
TZON-0003SLOWVT: 350 ms
TZON-0004SLOWVT: 40
TZON-0005SLOWVT: 6
TZON-0010FASTVT: 40 ms
TZON-0010SLOWVT: 40 ms
TZST-0001FASTVT: 2
TZST-0001FASTVT: 3
VF: 0

## 2013-02-11 NOTE — Progress Notes (Signed)
ICD check by industry for research 

## 2013-03-02 ENCOUNTER — Other Ambulatory Visit: Payer: Self-pay | Admitting: Cardiovascular Disease

## 2013-03-06 ENCOUNTER — Encounter: Payer: Self-pay | Admitting: Internal Medicine

## 2013-04-06 ENCOUNTER — Other Ambulatory Visit: Payer: Self-pay

## 2013-04-06 MED ORDER — CLOPIDOGREL BISULFATE 75 MG PO TABS: 75.0000 mg | ORAL_TABLET | Freq: Every day | ORAL | Status: DC

## 2013-04-09 ENCOUNTER — Other Ambulatory Visit: Payer: Self-pay | Admitting: Internal Medicine

## 2013-04-13 ENCOUNTER — Other Ambulatory Visit: Payer: Self-pay

## 2013-04-13 ENCOUNTER — Telehealth: Payer: Self-pay | Admitting: *Deleted

## 2013-04-13 MED ORDER — FUROSEMIDE 40 MG PO TABS: 40.0000 mg | ORAL_TABLET | Freq: Every day | ORAL | Status: DC

## 2013-04-13 NOTE — Telephone Encounter (Signed)
Confirmed dose furosemide 40 mg daily, patient request 90 day supply

## 2013-04-21 ENCOUNTER — Inpatient Hospital Stay (HOSPITAL_COMMUNITY)
Admission: EM | Admit: 2013-04-21 | Discharge: 2013-04-23 | DRG: 189 | Disposition: A | Discharge status: Home / Self Care | Payer: Medicare Other | Attending: Cardiovascular Disease | Admitting: Cardiovascular Disease

## 2013-04-21 ENCOUNTER — Other Ambulatory Visit: Payer: Self-pay

## 2013-04-21 ENCOUNTER — Emergency Department (HOSPITAL_COMMUNITY): Payer: Medicare Other

## 2013-04-21 ENCOUNTER — Encounter (HOSPITAL_COMMUNITY): Payer: Self-pay | Admitting: Emergency Medicine

## 2013-04-21 DIAGNOSIS — W19XXXA Unspecified fall, initial encounter: Secondary | ICD-10-CM

## 2013-04-21 DIAGNOSIS — I2510 Atherosclerotic heart disease of native coronary artery without angina pectoris: Secondary | ICD-10-CM | POA: Diagnosis present

## 2013-04-21 DIAGNOSIS — I5022 Chronic systolic (congestive) heart failure: Secondary | ICD-10-CM

## 2013-04-21 DIAGNOSIS — IMO0002 Reserved for concepts with insufficient information to code with codable children: Secondary | ICD-10-CM | POA: Diagnosis present

## 2013-04-21 DIAGNOSIS — J4489 Other specified chronic obstructive pulmonary disease: Secondary | ICD-10-CM | POA: Diagnosis present

## 2013-04-21 DIAGNOSIS — E119 Type 2 diabetes mellitus without complications: Secondary | ICD-10-CM | POA: Diagnosis present

## 2013-04-21 DIAGNOSIS — Z91199 Patient's noncompliance with other medical treatment and regimen due to unspecified reason: Secondary | ICD-10-CM

## 2013-04-21 DIAGNOSIS — Z7982 Long term (current) use of aspirin: Secondary | ICD-10-CM

## 2013-04-21 DIAGNOSIS — F17200 Nicotine dependence, unspecified, uncomplicated: Secondary | ICD-10-CM | POA: Diagnosis present

## 2013-04-21 DIAGNOSIS — R0600 Dyspnea, unspecified: Secondary | ICD-10-CM

## 2013-04-21 DIAGNOSIS — R55 Syncope and collapse: Secondary | ICD-10-CM

## 2013-04-21 DIAGNOSIS — I255 Ischemic cardiomyopathy: Secondary | ICD-10-CM | POA: Diagnosis present

## 2013-04-21 DIAGNOSIS — R609 Edema, unspecified: Secondary | ICD-10-CM

## 2013-04-21 DIAGNOSIS — R531 Weakness: Secondary | ICD-10-CM

## 2013-04-21 DIAGNOSIS — I5023 Acute on chronic systolic (congestive) heart failure: Secondary | ICD-10-CM

## 2013-04-21 DIAGNOSIS — J449 Chronic obstructive pulmonary disease, unspecified: Secondary | ICD-10-CM | POA: Diagnosis present

## 2013-04-21 DIAGNOSIS — I10 Essential (primary) hypertension: Secondary | ICD-10-CM | POA: Diagnosis present

## 2013-04-21 DIAGNOSIS — Z95810 Presence of automatic (implantable) cardiac defibrillator: Secondary | ICD-10-CM | POA: Diagnosis present

## 2013-04-21 DIAGNOSIS — Z794 Long term (current) use of insulin: Secondary | ICD-10-CM

## 2013-04-21 DIAGNOSIS — IMO0001 Reserved for inherently not codable concepts without codable children: Secondary | ICD-10-CM

## 2013-04-21 DIAGNOSIS — Z951 Presence of aortocoronary bypass graft: Secondary | ICD-10-CM

## 2013-04-21 DIAGNOSIS — J9610 Chronic respiratory failure, unspecified whether with hypoxia or hypercapnia: Principal | ICD-10-CM

## 2013-04-21 DIAGNOSIS — I252 Old myocardial infarction: Secondary | ICD-10-CM

## 2013-04-21 DIAGNOSIS — R9431 Abnormal electrocardiogram [ECG] [EKG]: Secondary | ICD-10-CM

## 2013-04-21 DIAGNOSIS — Z85038 Personal history of other malignant neoplasm of large intestine: Secondary | ICD-10-CM

## 2013-04-21 DIAGNOSIS — S0081XA Abrasion of other part of head, initial encounter: Secondary | ICD-10-CM

## 2013-04-21 DIAGNOSIS — Z9119 Patient's noncompliance with other medical treatment and regimen: Secondary | ICD-10-CM

## 2013-04-21 DIAGNOSIS — Z7902 Long term (current) use of antithrombotics/antiplatelets: Secondary | ICD-10-CM

## 2013-04-21 DIAGNOSIS — R5381 Other malaise: Secondary | ICD-10-CM

## 2013-04-21 DIAGNOSIS — Z9861 Coronary angioplasty status: Secondary | ICD-10-CM

## 2013-04-21 DIAGNOSIS — W010XXA Fall on same level from slipping, tripping and stumbling without subsequent striking against object, initial encounter: Secondary | ICD-10-CM | POA: Diagnosis present

## 2013-04-21 DIAGNOSIS — R0902 Hypoxemia: Secondary | ICD-10-CM

## 2013-04-21 DIAGNOSIS — Z79899 Other long term (current) drug therapy: Secondary | ICD-10-CM

## 2013-04-21 DIAGNOSIS — I2589 Other forms of chronic ischemic heart disease: Secondary | ICD-10-CM | POA: Diagnosis present

## 2013-04-21 HISTORY — DX: Chronic systolic (congestive) heart failure: I50.22

## 2013-04-21 HISTORY — DX: Chronic respiratory failure, unspecified whether with hypoxia or hypercapnia: J96.10

## 2013-04-21 LAB — CBC WITH DIFFERENTIAL/PLATELET
Eosinophils Absolute: 0.1 10*3/uL (ref 0.0–0.7)
Eosinophils Relative: 1 % (ref 0–5)
HCT: 52 % (ref 39.0–52.0)
Hemoglobin: 16.6 g/dL (ref 13.0–17.0)
Lymphocytes Relative: 14 % (ref 12–46)
Lymphs Abs: 0.9 10*3/uL (ref 0.7–4.0)
MCH: 33.1 pg (ref 26.0–34.0)
MCV: 103.6 fL — ABNORMAL HIGH (ref 78.0–100.0)
Monocytes Absolute: 0.4 10*3/uL (ref 0.1–1.0)
Monocytes Relative: 6 % (ref 3–12)
Neutro Abs: 5 10*3/uL (ref 1.7–7.7)
Platelets: 138 10*3/uL — ABNORMAL LOW (ref 150–400)
RBC: 5.02 MIL/uL (ref 4.22–5.81)

## 2013-04-21 LAB — PROTIME-INR
INR: 1.03 (ref 0.00–1.49)
Prothrombin Time: 13.3 seconds (ref 11.6–15.2)

## 2013-04-21 LAB — URINALYSIS, ROUTINE W REFLEX MICROSCOPIC
Ketones, ur: NEGATIVE mg/dL
Leukocytes, UA: NEGATIVE
Nitrite: NEGATIVE
Specific Gravity, Urine: 1.008 (ref 1.005–1.030)
Urobilinogen, UA: 0.2 mg/dL (ref 0.0–1.0)
pH: 6 (ref 5.0–8.0)

## 2013-04-21 LAB — COMPREHENSIVE METABOLIC PANEL
BUN: 33 mg/dL — ABNORMAL HIGH (ref 6–23)
CO2: 33 mEq/L — ABNORMAL HIGH (ref 19–32)
Calcium: 9.4 mg/dL (ref 8.4–10.5)
Creatinine, Ser: 1.19 mg/dL (ref 0.50–1.35)
GFR calc Af Amer: 71 mL/min — ABNORMAL LOW (ref >90)
GFR calc non Af Amer: 61 mL/min — ABNORMAL LOW (ref >90)
Glucose, Bld: 285 mg/dL — ABNORMAL HIGH (ref 70–99)
Total Protein: 7.5 g/dL (ref 6.0–8.3)

## 2013-04-21 MED ORDER — ASPIRIN 300 MG RE SUPP
300.0000 mg | RECTAL | Status: AC
  Filled 2013-04-21: qty 1

## 2013-04-21 MED ORDER — ASPIRIN EC 81 MG PO TBEC
81.0000 mg | DELAYED_RELEASE_TABLET | Freq: Every day | ORAL | Status: DC
  Administered 2013-04-22 – 2013-04-23 (×2): 81 mg via ORAL
  Filled 2013-04-21 (×2): qty 1

## 2013-04-21 MED ORDER — CARVEDILOL 3.125 MG PO TABS
3.1250 mg | ORAL_TABLET | Freq: Two times a day (BID) | ORAL | Status: DC
  Filled 2013-04-21: qty 1

## 2013-04-21 MED ORDER — ASPIRIN EC 81 MG PO TBEC: 81.0000 mg | DELAYED_RELEASE_TABLET | Freq: Every day | ORAL | Status: DC

## 2013-04-21 MED ORDER — NITROGLYCERIN 0.4 MG SL SUBL: 0.4000 mg | SUBLINGUAL_TABLET | SUBLINGUAL | Status: DC | PRN

## 2013-04-21 MED ORDER — INSULIN ASPART PROT & ASPART (70-30 MIX) 100 UNIT/ML ~~LOC~~ SUSP
20.0000 [IU] | Freq: Every day | SUBCUTANEOUS | Status: DC
  Administered 2013-04-22 – 2013-04-23 (×2): 20 [IU] via SUBCUTANEOUS
  Filled 2013-04-21: qty 10

## 2013-04-21 MED ORDER — ATORVASTATIN CALCIUM 40 MG PO TABS: 40.0000 mg | ORAL_TABLET | Freq: Every day | ORAL | Status: DC

## 2013-04-21 MED ORDER — LISINOPRIL 5 MG PO TABS
5.0000 mg | ORAL_TABLET | Freq: Two times a day (BID) | ORAL | Status: DC
  Administered 2013-04-21 – 2013-04-22 (×2): 5 mg via ORAL
  Filled 2013-04-21 (×3): qty 1

## 2013-04-21 MED ORDER — CARVEDILOL 3.125 MG PO TABS
3.1250 mg | ORAL_TABLET | Freq: Two times a day (BID) | ORAL | Status: DC
  Administered 2013-04-21 – 2013-04-22 (×2): 3.125 mg via ORAL
  Filled 2013-04-21 (×4): qty 1

## 2013-04-21 MED ORDER — FUROSEMIDE 40 MG PO TABS
40.0000 mg | ORAL_TABLET | Freq: Every day | ORAL | Status: DC
  Administered 2013-04-22 – 2013-04-23 (×2): 40 mg via ORAL
  Filled 2013-04-21 (×2): qty 1

## 2013-04-21 MED ORDER — MOMETASONE FURO-FORMOTEROL FUM 100-5 MCG/ACT IN AERO
2.0000 | INHALATION_SPRAY | Freq: Two times a day (BID) | RESPIRATORY_TRACT | Status: DC
  Administered 2013-04-21 – 2013-04-23 (×3): 2 via RESPIRATORY_TRACT
  Filled 2013-04-21: qty 8.8

## 2013-04-21 MED ORDER — ATORVASTATIN CALCIUM 40 MG PO TABS
40.0000 mg | ORAL_TABLET | Freq: Every day | ORAL | Status: DC
  Administered 2013-04-22 – 2013-04-23 (×2): 40 mg via ORAL
  Filled 2013-04-21 (×2): qty 1

## 2013-04-21 MED ORDER — CLOPIDOGREL BISULFATE 75 MG PO TABS: 75.0000 mg | ORAL_TABLET | Freq: Every day | ORAL | Status: DC

## 2013-04-21 MED ORDER — CLOPIDOGREL BISULFATE 75 MG PO TABS
75.0000 mg | ORAL_TABLET | Freq: Every day | ORAL | Status: DC
  Administered 2013-04-22 – 2013-04-23 (×2): 75 mg via ORAL
  Filled 2013-04-21 (×2): qty 1

## 2013-04-21 MED ORDER — ACETAMINOPHEN 325 MG PO TABS: 650.0000 mg | ORAL_TABLET | ORAL | Status: DC | PRN

## 2013-04-21 MED ORDER — ASPIRIN 81 MG PO CHEW
324.0000 mg | CHEWABLE_TABLET | ORAL | Status: AC
  Administered 2013-04-21: 324 mg via ORAL
  Filled 2013-04-21: qty 4

## 2013-04-21 MED ORDER — HEPARIN (PORCINE) IN NACL 100-0.45 UNIT/ML-% IJ SOLN
1100.0000 [IU]/h | INTRAMUSCULAR | Status: DC
  Administered 2013-04-21: 900 [IU]/h via INTRAVENOUS
  Administered 2013-04-22: 1100 [IU]/h via INTRAVENOUS
  Filled 2013-04-21 (×3): qty 250

## 2013-04-21 MED ORDER — HEPARIN BOLUS VIA INFUSION
4000.0000 [IU] | Freq: Once | INTRAVENOUS | Status: AC
  Administered 2013-04-21: 4000 [IU] via INTRAVENOUS
  Filled 2013-04-21: qty 4000

## 2013-04-21 MED ORDER — INSULIN ASPART PROT & ASPART (70-30 MIX) 100 UNIT/ML ~~LOC~~ SUSP
20.0000 [IU] | Freq: Once | SUBCUTANEOUS | Status: AC
  Administered 2013-04-21: 20 [IU] via SUBCUTANEOUS
  Filled 2013-04-21: qty 10

## 2013-04-21 MED ORDER — LISINOPRIL 5 MG PO TABS: 5.0000 mg | ORAL_TABLET | Freq: Two times a day (BID) | ORAL | Status: DC

## 2013-04-21 MED ORDER — ONDANSETRON HCL 4 MG/2ML IJ SOLN: 4.0000 mg | Freq: Four times a day (QID) | INTRAMUSCULAR | Status: DC | PRN

## 2013-04-21 NOTE — ED Notes (Signed)
Report given to floor nurse, Revonda Standard, RN. Nurse has no further questions upon report given.

## 2013-04-21 NOTE — ED Notes (Signed)
Pt oxygen saturation dropped to 68%. Upon assessment, pts oxygen had come unhooked from wall. Pt rehooked to oxygen and educated on breathing. Pts oxygen saturation increased to 90s. NAD noted. Pt alert and mentating appropriately.

## 2013-04-21 NOTE — ED Notes (Signed)
Per EMS: pt was helping sister in law outside on concrete pad; pt became suddenly dizzy, pt fell face forward on hands and on face with abrasions to nose, forehead, left palm and right knee; alert and oriented; negative on stroke scale; cleared c-spine; Pt denies pain; pt sats initially 89% on room air pt is a smoker; pt not on oxygen at home; pupils are unequal but this is pt normal; pt has pitting edema to both ankles; clear lungs; 12 lead shows LBBB; V4R clear; BP 110/60 HR 75 CBG 259 pt is diabetic and normally takes 20 units of 70/30 but he had run out last night. 18 right AC

## 2013-04-21 NOTE — H&P (Addendum)
ADMISSION HISTORY AND PHYSICAL   Date: 04/21/2013               Patient Name:  James Moon MRN: [id number]  DOB: Mar 23, 1946 Age / Sex: 67 y.o., male        PCP: Hollice Espy Primary Cardiologist: Cullen Vanallen         History of Present Illness: Patient is a 67 y.o. male with a PMHx of chronic systolic CHF, CAD, COPD, ICD , DM  who was admitted to [facility] on 04/21/2013 for evaluation of syncope.   He was at his sister's house today to help with some bill paying.  He says that he turned around too fast and lost his footing. It appears that he told the ER doctors earlier today that he became very dizzy and then passed out. He fell and cracked his nose and 4 and on the pavement.  He has an ICD that has the capability to monitor ST segment changes. He apparently had some ST segment changes earlier today and our Designer, industrial/product [facility]) received and alert. He apparently did not get the vibration that signals that his device is about an alert.  In the emergency room he has some mild ST segment depression in the inferior lateral leads with ST segment elevation in lead aVL. He denies any chest pain. He denies any shortness of breath. There was some documentation of such significant O2 saturation when his oxygen line was unplugged from a wall. His O2 sats were 60s.  He admits to eating a lot of salt. He eats TV dinners frequently.   Medications: Outpatient medications:  No current facility-administered medications for this encounter.   Current Outpatient Prescriptions  Medication Sig Dispense Refill  . aspirin EC 81 MG tablet Take 81 mg by mouth daily.       Marland Kitchen atorvastatin (LIPITOR) 40 MG tablet Take 40 mg by mouth daily.      . carvedilol (COREG) 3.125 MG tablet Take 3.125 mg by mouth 2 (two) times daily with a meal.      . clopidogrel (PLAVIX) 75 MG tablet Take 75 mg by mouth daily.      . Fluticasone-Salmeterol (ADVAIR) 250-50 MCG/DOSE AEPB Inhale 1 puff into the lungs every  12 (twelve) hours.      . furosemide (LASIX) 40 MG tablet Take 40 mg by mouth daily.      . insulin NPH-regular (NOVOLIN 70/30) (70-30) 100 UNIT/ML injection Inject 20-25 Units into the skin daily with breakfast.      . lisinopril (PRINIVIL,ZESTRIL) 5 MG tablet Take 5 mg by mouth 2 (two) times daily.      . nitroGLYCERIN (NITROSTAT) 0.4 MG SL tablet Place 0.4 mg under the tongue every 5 (five) minutes as needed. For chest pain         No Known Allergies   Past Medical History  Diagnosis Date  . Diabetes mellitus   . Colon cancer     s/p colectomy   . Hypertension   . CAD (coronary artery disease)     CABG in 1996  . NSTEMI (non-ST elevated myocardial infarction) Dec. 11, 2012    DES x 2 to SVG to 1st and 2nd OM and SVG to PDA  . Ischemic cardiomyopathy Dec. 2012    EF is 20 to 25%>30-35% echo 3/13  . Tobacco abuse   . COPD (chronic obstructive pulmonary disease)     Past Surgical History  Procedure Laterality Date  .  Colon surgery    . Open heart surgery    . Coronary stent placement  Dec. 2012    DES to SVG to 1st and 2nd OM and SVG to the PDA    Family History  Problem Relation Age of Onset  . Heart attack Father   . Diabetes Father   . Breast cancer Mother     Social History:  reports that he has been smoking Cigarettes.  He started smoking about 13 months ago. He has a 25 pack-year smoking history. He has never used smokeless tobacco. He reports that he does not drink alcohol or use illicit drugs.   Review of Systems: Constitutional:  denies fever, chills, diaphoresis, appetite change and fatigue.  HEENT: denies photophobia, eye pain, redness, hearing loss, ear pain, congestion, sore throat, rhinorrhea, sneezing, neck pain, neck stiffness and tinnitus.  Respiratory: admits to SOB, DOE,    Cardiovascular: denies chest pain, palpitations and leg swelling.  Gastrointestinal: denies nausea, vomiting, abdominal pain, diarrhea, constipation, blood in stool.    Genitourinary: denies dysuria, urgency, frequency, hematuria, flank pain and difficulty urinating.  Musculoskeletal: denies  myalgias, back pain, joint swelling, arthralgias and gait problem.   Skin: denies pallor, rash and wound.  Neurological: denies dizziness, seizures, syncope, weakness, light-headedness, numbness and headaches.   Hematological: denies adenopathy, easy bruising, personal or family bleeding history.  Psychiatric/ Behavioral: denies suicidal ideation, mood changes, confusion, nervousness, sleep disturbance and agitation.    Physical Exam: BP 114/54  Pulse 63  Temp(Src) 98.2 F (36.8 C) (Oral)  Resp 18  SpO2 100%  General: Vital signs reviewed and noted. Well-developed, well-nourished, in no acute distress; alert, appropriate and cooperative throughout examination.  Head: Normocephalic,  He has trauma to his nose and forehead  Neck: Supple. Negative for carotid bruits. JVD not elevated.  Lungs:  Few bilateral wheezes  Heart: RR with S1 S2. No murmurs, rubs, or gallops appreciated.  Abdomen:  Soft, non-tender, non-distended with normoactive bowel sounds. No hepatomegaly. No rebound/guarding. No obvious abdominal masses  MSK: Strength and the appear normal for age.  Extremities: No clubbing or cyanosis. 2+ edema.    Distal pedal pulses are 2+ and equal bilaterally.  Neurologic: Alert and oriented X 3. Moves all extremities spontaneously.  His history seems to vary somewhat.   Psych:       Lab results: Basic Metabolic Panel:  Recent Labs Lab 04/21/13 1435  NA 140  K 4.9  CL 97  CO2 33*  GLUCOSE 285*  BUN 33*  CREATININE 1.19  CALCIUM 9.4    Liver Function Tests:  Recent Labs Lab 04/21/13 1435  AST 23  ALT 15  ALKPHOS 72  BILITOT 0.7  PROT 7.5  ALBUMIN 3.7   No results found for this basename: LIPASE, AMYLASE,  in the last 168 hours  CBC:  Recent Labs Lab 04/21/13 1435  WBC 6.4  NEUTROABS 5.0  HGB 16.6  HCT 52.0  MCV 103.6*  PLT  138*    Cardiac Enzymes:  Recent Labs Lab 04/21/13 1436  TROPONINI <0.30    BNP: No components found with this basename: POCBNP,   CBG: No results found for this basename: GLUCAP,  in the last 168 hours  Coagulation Studies:  Recent Labs  04/21/13 1435  LABPROT 13.3  INR 1.03     Other results: EKG :  NSR with ST depression in the inferior and lateral leads.  ST elevation in aVL.  Imaging: Dg Chest 2 View  04/21/2013  CLINICAL DATA:  Near syncopal event  EXAM: CHEST  2 VIEW  COMPARISON:  12/12/2011  FINDINGS: A defibrillator is again noted. The cardiac shadow is stable. Postsurgical changes are seen. The lungs are clear bilaterally. Mild interstitial changes are noted. Hyperinflation is again seen.  IMPRESSION: COPD without acute abnormality.   Electronically Signed   By: Alcide Clever M.D.   On: 04/21/2013 14:29   Ct Head Wo Contrast  04/21/2013   CLINICAL DATA:  Near syncope.  EXAM: CT HEAD WITHOUT CONTRAST  TECHNIQUE: Contiguous axial images were obtained from the base of the skull through the vertex without intravenous contrast.  COMPARISON:  None.  FINDINGS: No skull fracture or intracranial hemorrhage.  Prominent small vessel disease type changes without CT evidence of large acute infarct.  Atrophy without hydrocephalus.  No intracranial mass lesion noted on this unenhanced exam.  Prominent vascular calcifications.  IMPRESSION: No skull fracture or intracranial hemorrhage.  Prominent small vessel disease type changes without CT evidence of large acute infarct.  Atrophy without hydrocephalus.  Prominent vascular calcifications.   Electronically Signed   By: Bridgett Larsson M.D.   On: 04/21/2013 15:29        Assessment & Plan:  1. Syncopal episode: The patient presents with an episode of probable syncope. His history has changed somewhat throughout the day. He initially told emergency room doctor that he became dizzy and then passed out. Today when I arrived he stated  that he stumbled and lost his footing.  Abdomen Mr. Alejandro  For a  Long time, no that sometimes his history can be somewhat unreliable.  Interestingly, he has an ICD that is designed to monitor ST segment changes. He has been having some ST segment changes throughout the day and her research coordinator received a notice that he was having ST segment depression.  He denies receiving any vibration that would indicate that his device since about a wedding.  His initial troponin level is negative. We will collect serial  troponin levels. We'll observe him overnight. He needs something from a congestive heart failure standpoint anyway.  2. Chronic systolic congestive heart failure: the patient has been chronic systolic congestive heart failure. He is very noncompliant with his diet. He eats TV dinners almost every night and eats lots of salty foods.  We will give him some IV Lasix during this admission and we'll try to get him to reduce the salt in his diet.  3. Coronary artery disease: the patient has a history of known coronary disease. Denies any chest pain but he has had some ST segment depression in the inferior leads as well as ST segment elevation in aVL. We'll collect cardiac enzymes. DVT PPX -   4. COPD: The patient continues to smoke.   Vesta Mixer, Montez Hageman., MD, [facility] 04/21/2013, 5:44 PM

## 2013-04-21 NOTE — ED Provider Notes (Signed)
CSN: [id number]     Arrival date & time 04/21/13  1316 History   First MD Initiated Contact with Patient 04/21/13 1335     Chief Complaint  Patient presents with  . Near Syncope   (Consider location/radiation/quality/duration/timing/severity/associated sxs/prior Treatment) HPI Pt with history as below brought to the ED via EMS after falling on concrete patio just prior to arrival. He states he just felt very weak all of a sudden and fell to the ground, hitting his face on the concrete, but denies any LOC. Denies CP, SOB prior to episode. He reports mild headache but otherwise feels well now.   Past Medical History  Diagnosis Date  . Diabetes mellitus   . Colon cancer     s/p colectomy   . Hypertension   . CAD (coronary artery disease)     CABG in 1996  . NSTEMI (non-ST elevated myocardial infarction) Dec. 11, 2012    DES x 2 to SVG to 1st and 2nd OM and SVG to PDA  . Ischemic cardiomyopathy Dec. 2012    EF is 20 to 25%>30-35% echo 3/13  . Tobacco abuse   . COPD (chronic obstructive pulmonary disease)    Past Surgical History  Procedure Laterality Date  . Colon surgery    . Open heart surgery    . Coronary stent placement  Dec. 2012    DES to SVG to 1st and 2nd OM and SVG to the PDA   Family History  Problem Relation Age of Onset  . Heart attack Father   . Diabetes Father   . Breast cancer Mother    History  Substance Use Topics  . Smoking status: Current Every Day Smoker -- 0.50 packs/day for 50 years    Types: Cigarettes    Start date: 02/26/2012  . Smokeless tobacco: Never Used     Comment: he has tried to quit many times.  . Alcohol Use: No    Review of Systems All other systems reviewed and are negative except as noted in HPI.   Allergies  Review of patient's allergies indicates no known allergies.  Home Medications   Current Outpatient Rx  Name  Route  Sig  Dispense  Refill  . ADVAIR DISKUS 250-50 MCG/DOSE AEPB      INHALE ONE DOSE BY MOUTH TWICE  DAILY   60 each   0     Needs appt for any additional refills   . aspirin EC 81 MG tablet   Oral   Take 81 mg by mouth daily.           Marland Kitchen atorvastatin (LIPITOR) 40 MG tablet   Oral   Take 1 tablet (40 mg total) by mouth daily.   90 tablet   3   . carvedilol (COREG) 3.125 MG tablet   Oral   Take 1 tablet (3.125 mg total) by mouth 2 (two) times daily with a meal.   180 tablet   3   . clopidogrel (PLAVIX) 75 MG tablet   Oral   Take 1 tablet (75 mg total) by mouth daily.   30 tablet   3   . Fluticasone-Salmeterol (ADVAIR) 250-50 MCG/DOSE AEPB   Inhalation   Inhale 1 puff into the lungs daily.          . furosemide (LASIX) 40 MG tablet   Oral   Take 1 tablet (40 mg total) by mouth daily.   90 tablet   3     This is a 90  day supply   . insulin NPH-insulin regular (NOVOLIN 70/30) (70-30) 100 UNIT/ML injection      20units in the am and 20units in the pm         . lisinopril (PRINIVIL,ZESTRIL) 5 MG tablet   Oral   Take 1 tablet (5 mg total) by mouth 2 (two) times daily.   180 tablet   3   . nitroGLYCERIN (NITROSTAT) 0.4 MG SL tablet   Sublingual   Place 0.4 mg under the tongue every 5 (five) minutes as needed. For chest pain          BP 108/68  Pulse 73  Temp(Src) 98.2 F (36.8 C) (Oral)  Resp 20  SpO2 99% Physical Exam  Nursing note and vitals reviewed. Constitutional: He is oriented to person, place, and time. He appears well-developed and well-nourished.  HENT:  Head: Normocephalic.  Abrasion forehead and nose  Eyes: EOM are normal. Pupils are equal, round, and reactive to light.  Neck: Normal range of motion. Neck supple.  Cardiovascular: Normal rate, normal heart sounds and intact distal pulses.   Pulmonary/Chest: Effort normal and breath sounds normal.  Abdominal: Bowel sounds are normal. He exhibits no distension. There is no tenderness.  Musculoskeletal: Normal range of motion. He exhibits no edema and no tenderness.  Neurological: He  is alert and oriented to person, place, and time. He has normal strength. No cranial nerve deficit or sensory deficit.  Skin: Skin is warm and dry. No rash noted.  Psychiatric: He has a normal mood and affect.    ED Course  Procedures (including critical care time) Labs Review Labs Reviewed  CBC WITH DIFFERENTIAL - Abnormal; Notable for the following:    MCV 103.6 (*)    Platelets 138 (*)    Neutrophils Relative % 79 (*)    All other components within normal limits  COMPREHENSIVE METABOLIC PANEL - Abnormal; Notable for the following:    CO2 33 (*)    Glucose, Bld 285 (*)    BUN 33 (*)    GFR calc non Af Amer 61 (*)    GFR calc Af Amer 71 (*)    All other components within normal limits  URINALYSIS, ROUTINE W REFLEX MICROSCOPIC - Abnormal; Notable for the following:    Glucose, UA 250 (*)    All other components within normal limits  TROPONIN I  PROTIME-INR  APTT   Imaging Review Dg Chest 2 View  04/21/2013   CLINICAL DATA:  Near syncopal event  EXAM: CHEST  2 VIEW  COMPARISON:  12/12/2011  FINDINGS: A defibrillator is again noted. The cardiac shadow is stable. Postsurgical changes are seen. The lungs are clear bilaterally. Mild interstitial changes are noted. Hyperinflation is again seen.  IMPRESSION: COPD without acute abnormality.   Electronically Signed   By: Alcide Clever M.D.   On: 04/21/2013 14:29   Ct Head Wo Contrast  04/21/2013   CLINICAL DATA:  Near syncope.  EXAM: CT HEAD WITHOUT CONTRAST  TECHNIQUE: Contiguous axial images were obtained from the base of the skull through the vertex without intravenous contrast.  COMPARISON:  None.  FINDINGS: No skull fracture or intracranial hemorrhage.  Prominent small vessel disease type changes without CT evidence of large acute infarct.  Atrophy without hydrocephalus.  No intracranial mass lesion noted on this unenhanced exam.  Prominent vascular calcifications.  IMPRESSION: No skull fracture or intracranial hemorrhage.  Prominent  small vessel disease type changes without CT evidence of large acute infarct.  Atrophy  without hydrocephalus.  Prominent vascular calcifications.   Electronically Signed   By: Bridgett Larsson M.D.   On: 04/21/2013 15:29    Date: 04/21/2013  Rate: 68  Rhythm: normal sinus rhythm  QRS Axis: normal  Intervals: normal  ST/T Wave abnormalities: ST depressions inferiorly  Conduction Disutrbances:none  Narrative Interpretation:   Old EKG Reviewed: changes noted, ST depressions are new     MDM   1. Weakness   2. Fall, initial encounter   3. Abrasion of face, initial encounter   4. EKG abnormalities     Labs and imaging reviewed and unremarkable. ST changes on EKG. No dysrhythmias per St. Jude rep, but ?ST changes on this device which is designed to monitor ST segments and alert the patient if he has persistent elevation or depression. Spoke with [facility] cardiology who will come evaluation the patient.     Charles B. Bernette Mayers, MD 04/21/13 [phone]

## 2013-04-21 NOTE — ED Notes (Signed)
Consulting civil engineer at [employer]

## 2013-04-21 NOTE — Progress Notes (Signed)
ANTICOAGULATION CONSULT NOTE - Initial Consult  Pharmacy Consult for heparin Indication: chest pain/ACS  No Known Allergies  Patient Measurements: Wt= 64kg Ht= 5' 10'' IBW= 73  Vital Signs: Temp: 98.6 F (37 C) (10/22 1849) Temp src: Oral (10/22 1849) BP: 115/64 mmHg (10/22 1849) Pulse Rate: 71 (10/22 1830)  Labs:  Recent Labs  04/21/13 1435 04/21/13 1436 04/21/13 1807  HGB 16.6  --   --   HCT 52.0  --   --   PLT 138*  --   --   APTT 28  --   --   LABPROT 13.3  --   --   INR 1.03  --   --   CREATININE 1.19  --   --   TROPONINI  --  <0.30 <0.30    The CrCl is unknown because both a height and weight (above a minimum accepted value) are required for this calculation.   Medical History: Past Medical History  Diagnosis Date  . Diabetes mellitus   . Colon cancer     s/p colectomy   . Hypertension   . CAD (coronary artery disease)     CABG in 1996  . NSTEMI (non-ST elevated myocardial infarction) Dec. 11, 2012    DES x 2 to SVG to 1st and 2nd OM and SVG to PDA  . Ischemic cardiomyopathy Dec. 2012    EF is 20 to 25%>30-35% echo 3/13  . Tobacco abuse   . COPD (chronic obstructive pulmonary disease)      Assessment: 67 yo male here with syncope and for r/o ACS to begin heparin.  Goal of Therapy:  Heparin level 0.3-0.7 units/ml Monitor platelets by anticoagulation protocol: Yes   Plan:  -Heparin bolus 4000 units IV followed by 900 units/hr (~14 units/kg/hr) -Heparin level in 6 hours and daily wth CBC daily  Harland German, Pharm D 04/21/2013 7:27 PM

## 2013-04-21 NOTE — ED Notes (Signed)
Dr Sheldon at bedside  

## 2013-04-22 ENCOUNTER — Inpatient Hospital Stay (HOSPITAL_COMMUNITY): Payer: Medicare Other

## 2013-04-22 ENCOUNTER — Encounter (HOSPITAL_COMMUNITY): Payer: Self-pay | Admitting: General Practice

## 2013-04-22 DIAGNOSIS — R9431 Abnormal electrocardiogram [ECG] [EKG]: Secondary | ICD-10-CM

## 2013-04-22 DIAGNOSIS — I5023 Acute on chronic systolic (congestive) heart failure: Secondary | ICD-10-CM

## 2013-04-22 DIAGNOSIS — J449 Chronic obstructive pulmonary disease, unspecified: Secondary | ICD-10-CM

## 2013-04-22 DIAGNOSIS — I509 Heart failure, unspecified: Secondary | ICD-10-CM

## 2013-04-22 DIAGNOSIS — R0609 Other forms of dyspnea: Secondary | ICD-10-CM

## 2013-04-22 DIAGNOSIS — J4489 Other specified chronic obstructive pulmonary disease: Secondary | ICD-10-CM

## 2013-04-22 DIAGNOSIS — R0902 Hypoxemia: Secondary | ICD-10-CM

## 2013-04-22 DIAGNOSIS — J9610 Chronic respiratory failure, unspecified whether with hypoxia or hypercapnia: Principal | ICD-10-CM

## 2013-04-22 LAB — LIPID PANEL
Cholesterol: 71 mg/dL (ref 0–200)
HDL: 36 mg/dL — ABNORMAL LOW (ref >39)
LDL Cholesterol: 24 mg/dL (ref 0–99)
Total CHOL/HDL Ratio: 2 RATIO
Triglycerides: 54 mg/dL (ref <150)
VLDL: 11 mg/dL (ref 0–40)

## 2013-04-22 LAB — GLUCOSE, CAPILLARY
Glucose-Capillary: 118 mg/dL — ABNORMAL HIGH (ref 70–99)
Glucose-Capillary: 161 mg/dL — ABNORMAL HIGH (ref 70–99)

## 2013-04-22 LAB — D-DIMER, QUANTITATIVE (NOT AT ARMC): D-Dimer, Quant: 1.07 ug/mL-FEU — ABNORMAL HIGH (ref 0.00–0.48)

## 2013-04-22 LAB — CBC
HCT: 45.3 % (ref 39.0–52.0)
Platelets: 125 10*3/uL — ABNORMAL LOW (ref 150–400)
RDW: 14.6 % (ref 11.5–15.5)
WBC: 6.1 10*3/uL (ref 4.0–10.5)

## 2013-04-22 LAB — HEPARIN LEVEL (UNFRACTIONATED): Heparin Unfractionated: 0.15 IU/mL — ABNORMAL LOW (ref 0.30–0.70)

## 2013-04-22 LAB — TROPONIN I: Troponin I: <0.3 ng/mL (ref <0.30)

## 2013-04-22 LAB — PRO B NATRIURETIC PEPTIDE: Pro B Natriuretic peptide (BNP): 1054 pg/mL — ABNORMAL HIGH (ref 0–125)

## 2013-04-22 MED ORDER — INFLUENZA VAC SPLIT QUAD 0.5 ML IM SUSP
0.5000 mL | Freq: Once | INTRAMUSCULAR | Status: AC
Start: 2013-04-22 — End: 2013-04-22
  Administered 2013-04-22: 0.5 mL via INTRAMUSCULAR
  Filled 2013-04-22: qty 0.5

## 2013-04-22 MED ORDER — HEPARIN BOLUS VIA INFUSION
2000.0000 [IU] | Freq: Once | INTRAVENOUS | Status: AC
  Administered 2013-04-22: 2000 [IU] via INTRAVENOUS
  Filled 2013-04-22: qty 2000

## 2013-04-22 MED ORDER — INFLUENZA VAC SPLIT QUAD 0.5 ML IM SUSP: 0.5000 mL | INTRAMUSCULAR | Status: DC

## 2013-04-22 MED ORDER — ALBUTEROL SULFATE (5 MG/ML) 0.5% IN NEBU: 2.5000 mg | INHALATION_SOLUTION | RESPIRATORY_TRACT | Status: DC | PRN

## 2013-04-22 MED ORDER — INSULIN ASPART 100 UNIT/ML ~~LOC~~ SOLN
0.0000 [IU] | Freq: Three times a day (TID) | SUBCUTANEOUS | Status: DC
  Administered 2013-04-22: 2 [IU] via SUBCUTANEOUS
  Administered 2013-04-23: 3 [IU] via SUBCUTANEOUS
  Administered 2013-04-23: 1 [IU] via SUBCUTANEOUS
  Administered 2013-04-23: 2 [IU] via SUBCUTANEOUS

## 2013-04-22 MED ORDER — CARVEDILOL 3.125 MG PO TABS
3.1250 mg | ORAL_TABLET | Freq: Two times a day (BID) | ORAL | Status: DC
  Administered 2013-04-22 – 2013-04-23 (×3): 3.125 mg via ORAL
  Filled 2013-04-22 (×4): qty 1

## 2013-04-22 MED ORDER — IOHEXOL 350 MG/ML SOLN
100.0000 mL | Freq: Once | INTRAVENOUS | Status: AC | PRN
  Administered 2013-04-22: 100 mL via INTRAVENOUS

## 2013-04-22 NOTE — Progress Notes (Addendum)
Pt was being assessed for possible dc this AM. Given reported O2 desat in ER, we took him off oxygen and O2 sat was 81% on RA requiring 4L to come up to 91-92. He required 6L O2 to stay at 88% with ambulation. Did have increased WOB but no significant subjective complaints. He was remotely on home O2 but per most recent pulm note 03/2012 was no longer wearing it. EKG still with inferior ST/T changes this morning c/w yesterday. I do not see that the patient got IV Lasix this admission. BP is running low this AM (82/48 autocuff, 92/44 manual). He already got AM meds of lisinopril, Coreg, and oral Lasix this AM. Not acutely symptomatic with this BP.  Discussed the above with Dr. Elease Hashimoto and we will keep the patient for observation and further eval today. Stop lisinopril for now. Will check d-dimer stat and CTA if abnormal. Will ask pulmonology to evaluate the patient to optimize his COPD regimen. Will also add pBNP to labs.  If d-dimer and f/u troponin are negative, plan to stop IV heparin. Dr. Elease Hashimoto does not feel that further cardiac w/u is needed at this time for the EKG changes described above as he doubts ACS. Pt informed of plan and agreeable. Venba Zenner PA-C  Addendum: d-dimer elevated, will proceed with CTA to r/o PE. pBNP 1k but BP too soft for diuresis at present. Will fluid restrict. Pulm cons has been called. Disha Cottam PA-C

## 2013-04-22 NOTE — Care Management Note (Signed)
    Page 1 of 2   04/23/2013     2:44:26 PM   CARE MANAGEMENT NOTE 04/23/2013  Patient:  James Moon, James Moon   Account Number:  [id number]  Date Initiated:  04/22/2013  Documentation initiated by:  GRAVES-BIGELOW,Eulon Allnutt  Subjective/Objective Assessment:   Pt admitted with syncope. Pt is from home alone and still works at [employer]. Pt states he has 02 and has not used it in the last year. CM called [facility] and pt is active with them for DME. Pt will need order for home evaluation for 02.     Action/Plan:   CM did call [facility] and pt has had Advair diskus filled this past June for 40.00. Pt does have Rx coverage. CM did a benefits check for albuterol and ipratropium. Will make pt aware once completed.   Anticipated DC Date:  04/23/2013   Anticipated DC Plan:  HOME W HOME HEALTH SERVICES      DC Planning Services  CM consult  Medication Assistance      [facility] Choice  DURABLE MEDICAL EQUIPMENT   Choice offered to / List presented to:  C-1 Patient   DME arranged  [facility]      DME agency  [facility]        Status of service:  Completed, signed off Medicare Important Message given?   (If response is "NO", the following Medicare IM given date fields will be blank) Date Medicare IM given:   Date Additional Medicare IM given:    Discharge Disposition:  HOME/SELF CARE  Per UR Regulation:  Reviewed for med. necessity/level of care/duration of stay  If discussed at Long Length of Stay Meetings, dates discussed:    Comments:  04-23-13 [address], [place] [phone] CM did speak to NP in reference to cost of medications. Pt states he has 02 at home and [facility] will deliver a tank to room before d/c. CM did verify with [facility] that pt has a nebulizer machine that was delivered to pt in 2012. Orders have been placed for 02 evaluation for home so [facility] can go back out and make sure pt has the 02 supplies that's needed. Staff RN is aware of disposition plan. Daughter  is to pick up pt today. No further needs from CM at this time.    04-23-13 0272 Tomi Bamberger, RN,BSN [phone] Nebulizers: albuterol is a tier 1, no auth required, $ 3.00 co-pay ipratropium bromide is a tier 1, no auth required, $3.00 co-pay  advair diskus  is a tier 3, no auth required, $  40.00 co-pay  atrovent w/ nebulizer is covered under part b, patient will have a 20% co-insurance  atrovent NOT w/ nebulizer is not covered, auth required, tier 4, $95.00 co-pay  (patient has $1707.41 until entering coverage gap)  PATIENT CAN USE:: [facility], [facility], or EPIC PHARMACIES (mom and pop pharmacies) for the co-pays listed  04-22-13 [address] Tomi Bamberger, RN,BSN [phone]  Ipratropium 0.02% nebulizer soln* (25x2.62ml vials) & Albuterol 0.083% nebulizer soln* (25x69ml vials are on the $4.00 med list at [employer]. CM will continue to f/u in am.

## 2013-04-22 NOTE — Discharge Summary (Signed)
Discharge Summary   Patient ID: James Moon MRN: [id number], DOB/AGE: [id number] 67 y.o. Admit date: 04/21/2013 D/C date:     04/23/2013  Primary Cardiologist: Staton Markey  Primary Discharge Diagnoses:  1. Fall 2. Chronic systolic CHF/ICM EF 20-25% -> 40-98% by echo 08/2011 - ACEI held due to hypotension 3. CAD - prior hx of CABG 1996, NSTEMI 06/2011 DES x 2 to SVG to 1st and 2nd OM and SVG to PDA 4. COPD  5. Hypoxia likely due to chronic respiratory failure from COPD  Secondary Discharge Diagnoses:  1. Diabetes mellitus insulin dependent 2. Colon cancer s/p colectomy 3. Hypertension  4. Tobacco abuse  Hospital Course: James Moon is a 67 y/o M with history of chronic systolic CHF, ICM s/p ICD, CAD, COPD, DM who presented to [facility] 04/21/2013 with possible syncopal spell. It appears that he initially told the ER doctors that he felt weak then fell to the ground with possible syncope, but to the cardiologist admitting him, he stated he turned around too fast and lost his footing then fell onto the pavement without loss of consciousness. He has an ICD that has the capability to monitor ST segment changes. He apparently had some ST segment changes earlier today and our Designer, industrial/product [facility]) received an alert. The patient was unaware of this. In the emergency room he had some ST segment depression/TWI in the inferior lateral leads with ST segment elevation in lead aVL. Per report there were no dysrhythmias on ICD interrogation. He denied any chest pain or SOB. There was documentation of O2 desaturation in the ER when his oxygen line was unplugged from a wall with O2 sats dropping to 68%. However, he was not symptomatic acutely with this. He was placed back on supplemental O2. He did admit to eating a lot of salt and TV dinners frequently. Initial troponin was negative and CXR showed COPD without acute abnormality. CT head was nonacute. He was educated on salt restriction.  Despite EKG changes the patient was asymptomatic. He was treated with IV heparin while he ruled out. Troponins remained negative with no further cardiac workup recommended at this time.  He was, however, found to have hypoxia this adm likely due to chronic respiratory failure related to severe COPD. He was previously on home O2 but had not been using it recently. On 04/22/13, he was 67% on RA coming up to 91-92% on 4L, and required 6L O2 to stay at 88% and above on ambulation. The patient was seen by pulmonology. D-dimer was elevated so CTA was performed demonstrating no evidence of PE, just mild dependent atelectasis of the posterior lungs. His COPD regimen was optimized with addition of nebs and inhalers as below. He admitted he had been unable to afford Advair. The possibility of possible syncope due to hypoxemia was posed but again it was not clear that the patient truly ever lost consciousness. Care management was involved in determining affordability. Pulmonology kindly assisted with arrangement of home O2, nebulizer machine, and prescription/instruction for use of albuterol/atrovent nebs and Combivent. The patient feels well without complaint today. Dr. Elease Hashimoto has seen and examined the patient today and feels he is stable for discharge.   The patient's lisinopril was held at discharge due to hypotension. Can consider resuming as outpatient if BP better.  Discharge Vitals: Blood pressure 107/55, pulse 60, temperature 98.6 F (37 C), temperature source Oral, resp. rate 17, height 5\' 10"  (1.778 m), weight 139 lb 4.8 oz (63.186 kg), SpO2 96.00%.  Labs: Lab Results  Component Value Date   WBC 7.9 04/23/2013   HGB 15.0 04/23/2013   HCT 48.4 04/23/2013   MCV 103.0* 04/23/2013   PLT 120* 04/23/2013     Recent Labs Lab 04/21/13 1435 04/23/13 0514  NA 140 138  K 4.9 5.0  CL 97 97  CO2 33* 32  BUN 33* 24*  CREATININE 1.19 1.10  CALCIUM 9.4 9.1  PROT 7.5  --   BILITOT 0.7  --   ALKPHOS 72   --   ALT 15  --   AST 23  --   GLUCOSE 285* 169*    Recent Labs  04/21/13 1436 04/21/13 1807 04/22/13 0955  TROPONINI <0.30 <0.30 <0.30   Lab Results  Component Value Date   CHOL 71 04/22/2013   HDL 36* 04/22/2013   LDLCALC 24 04/22/2013   TRIG 54 04/22/2013   Lab Results  Component Value Date   DDIMER 1.07* 04/22/2013    Diagnostic Studies/Procedures   Dg Chest 2 View 04/21/2013   CLINICAL DATA:  Near syncopal event  EXAM: CHEST  2 VIEW  COMPARISON:  12/12/2011  FINDINGS: A defibrillator is again noted. The cardiac shadow is stable. Postsurgical changes are seen. The lungs are clear bilaterally. Mild interstitial changes are noted. Hyperinflation is again seen.  IMPRESSION: COPD without acute abnormality.   Electronically Signed   By: Alcide Clever M.D.   On: 04/21/2013 14:29   Ct Head Wo Contrast 04/21/2013   CLINICAL DATA:  Near syncope.  EXAM: CT HEAD WITHOUT CONTRAST  TECHNIQUE: Contiguous axial images were obtained from the base of the skull through the vertex without intravenous contrast.  COMPARISON:  None.  FINDINGS: No skull fracture or intracranial hemorrhage.  Prominent small vessel disease type changes without CT evidence of large acute infarct.  Atrophy without hydrocephalus.  No intracranial mass lesion noted on this unenhanced exam.  Prominent vascular calcifications.  IMPRESSION: No skull fracture or intracranial hemorrhage.  Prominent small vessel disease type changes without CT evidence of large acute infarct.  Atrophy without hydrocephalus.  Prominent vascular calcifications.   Electronically Signed   By: Bridgett Larsson M.D.   On: 04/21/2013 15:29   CTA 04/22/13 FINDINGS: There is no pulmonary embolus. There is no mediastinal or hilar lymphadenopathy. The heart size is normal. There is no pericardial effusion. There is mild dependent atelectasis of the bilateral posterior lung bases, right greater than left. There is no focal pneumonia or pleural effusion. The  visualized upper abdominal structures are unremarkable. Review of the MIP images confirms the above findings. IMPRESSION:No pulmonary embolus. Mild dependent atelectasis of the posterior lungs.    Discharge Medications     Medication List    STOP taking these medications       Fluticasone-Salmeterol 250-50 MCG/DOSE Aepb  Commonly known as:  ADVAIR     lisinopril 5 MG tablet  Commonly known as:  PRINIVIL,ZESTRIL      TAKE these medications       albuterol (2.5 MG/3ML) 0.083% nebulizer solution  Commonly known as:  PROVENTIL  Take 3 mLs (2.5 mg total) by nebulization every 6 (six) hours as needed for wheezing.     aspirin EC 81 MG tablet  Take 81 mg by mouth daily.     atorvastatin 40 MG tablet  Commonly known as:  LIPITOR  Take 40 mg by mouth daily.     carvedilol 3.125 MG tablet  Commonly known as:  COREG  Take 3.125 mg by mouth  2 (two) times daily with a meal.     clopidogrel 75 MG tablet  Commonly known as:  PLAVIX  Take 75 mg by mouth daily.     furosemide 40 MG tablet  Commonly known as:  LASIX  Take 40 mg by mouth daily.     insulin NPH-regular (70-30) 100 UNIT/ML injection  Commonly known as:  NOVOLIN 70/30  Inject 20-25 Units into the skin daily with breakfast.     ipratropium 0.02 % nebulizer solution  Commonly known as:  ATROVENT  Take 2.5 mLs (500 mcg total) by nebulization every 6 (six) hours.     Ipratropium-Albuterol 20-100 MCG/ACT Aers respimat  Commonly known as:  COMBIVENT  Inhale 1 puff into the lungs every 6 (six) hours.     nitroGLYCERIN 0.4 MG SL tablet  Commonly known as:  NITROSTAT  Place 0.4 mg under the tongue every 5 (five) minutes as needed. For chest pain        Disposition   The patient will be discharged in stable condition to home. Discharge Orders   Future Appointments Provider Department Dept Phone   04/30/2013 10:45 AM Nyoka Cowden, MD [facility] [phone]   05/06/2013 11:10 AM Kennon Rounds [facility] [place] Office [phone]   05/17/2013 10:30 AM [facility] [employer] [place] Office [phone]   Future Orders Complete By Expires   Diet - low sodium heart healthy  As directed    DME Nebulizer machine  As directed    Increase activity slowly  As directed    Scheduling Instructions:     Do not use Combivent inhaler and nebulizer at the same time.  If you get to the pharmacy and Combivent is too expensive, please call Dr. Thurston Hole office in the morning and they will try to call in a cheaper version.     Follow-up Information   Follow up with Sandrea Hughs, MD On 04/30/2013. (Appt at 10:45)    Specialty:  Pulmonary Disease   Contact information:   [address] [place] [address] [phone]       Follow up with Tereso Newcomer, PA-C On 05/06/2013. [facility] - 11:10am)    Specialty:  Physician Assistant   Contact information:   [address] [place] [address] [phone]         Duration of Discharge Encounter: Greater than 30 minutes including physician and PA time.  Signed, Ronie Spies PA-C 04/23/2013, 4:53 PM   Attending Note:   The patient was seen and examined.  Agree with assessment and plan as noted above.  Changes made to the above note as needed.  Pt has improved since yesterday.  Ready for DC.  See my note from earlier today.   Vesta Mixer, Montez Hageman., MD, [facility] 04/23/2013, 6:03 PM

## 2013-04-22 NOTE — Progress Notes (Signed)
Inpatient Diabetes Program Recommendations  AACE/ADA: New Consensus Statement on Inpatient Glycemic Control (2013)  Target Ranges:  Prepandial:   less than 140 mg/dL      Peak postprandial:   less than 180 mg/dL (1-2 hours)      Critically ill patients:  140 - 180 mg/dL   Reason for Visit: Hyperglycemia  Results for PISTOL, KESSENICH (MRN [id number]) as of 04/22/2013 12:02  Ref. Range 04/21/2013 19:45 04/21/2013 21:36 04/22/2013 07:55 04/22/2013 11:36  Glucose-Capillary Latest Range: 70-99 mg/dL 784 (H) 696 (H) 77 295 (H)    Inpatient Diabetes Program Recommendations HgbA1C: HgbA1C to assess glycemic control prior to discharge Diet: Add CHO mod med to heart healthy diet  Note: Will follow while inpatient.  Thank you. Ailene Ards, RD, LDN, CDE Inpatient Diabetes Coordinator [phone]

## 2013-04-22 NOTE — Consult Note (Signed)
PULMONARY  / CRITICAL CARE MEDICINE  Name: James Moon MRN: [id number] DOB: [birth year]    ADMISSION DATE:  04/21/2013 CONSULTATION DATE:  04/22/13  REFERRING MD :  Dr. Elease Hashimoto PRIMARY SERVICE:  Cardiology  CHIEF COMPLAINT:  Hypoxemia  BRIEF PATIENT DESCRIPTION: 67 y/o M, current smoker, with PMH of COPD (FEV1 50% 09/2011) admitted on 10/22 after a questionable syncopal episode striking face on pavement.  PCCM consulted for hypoxemia  SIGNIFICANT EVENTS / STUDIES:  10/22 - Admit after ? Syncopal episode, new ST depression in inferior lateral leads, requiring 3L O2   CULTURES:   ANTIBIOTICS:   HISTORY OF PRESENT ILLNESS:   67 y/o M, current smoker (50 years with last 20+ at 1 1/2 - 2 ppd), with PMH of COPD (FEV1 50% 09/2011) admitted on 10/22 to [facility] after a questionable syncopal episode.  Patient reports he was at his sister-in-laws and he turned around to go to his car and his "legs gave out".  He indicates he does remember the fall and denies loss of consciousness.  However, he has abrasions on knees, hands and face.  He has an ICD that has the capability to monitor for ST changes and it noted new ST depression in inferior lateral leads. He denies chest pain, n/v or shortness of breath with episode.  On arrival, his oxygen sats were noted to be in the 60's.     Patient reports at baseline he is able to walk approx 40 yds before having to stop and rest.  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  He is supposed to be on 2L (he thinks) of oxygen but has not worn in the past two years.  He also has been prescribed advair and has been unable to purchase the advair for the last 3 months.  Patient denies daily sputum production, fevers, chills, n/v/d.  Notes reduced frequency of BM but normal consistency / color.  Endorses approx 20 lb weight loss since July 2014.  Has appetite but eats TV dinners due to  cost.  Denies night sweats, hemoptysis, chest pain.   PFT'S - PFT's 06/12/11 FEV1 1.20 (35%) ratio is 50 DLC0 110%  - PFT's 10/22/2011 FEV11.56 (50%) ratio is 47 And no better with B2 and DLCO 58% corrects to 69%     PAST MEDICAL HISTORY :  Past Medical History  Diagnosis Date  . Diabetes mellitus   . Colon cancer     s/p colectomy   . Hypertension   . CAD (coronary artery disease)     a. CABG in 1996. b. NSTEMI 06/2011: DES x 2 to SVG to 1st and 2nd OM and SVG to PDA.  . Ischemic cardiomyopathy Dec. 2012    a. EF 20 to 25% 2012. b. 30-35% echo 3/13.  . Tobacco abuse   . COPD (chronic obstructive pulmonary disease)   . Chronic systolic CHF (congestive heart failure)    Past Surgical History  Procedure Laterality Date  . Colon surgery    . Open heart surgery    . Coronary stent placement  Dec. 2012    DES to SVG to 1st and 2nd OM and SVG to the PDA  . Coronary artery bypass graft    . Coronary angioplasty    . Colon surgery      FOR HISTORY OF COLON CANCER   Prior to Admission medications   Medication Sig Start Date  End Date Taking? Authorizing Provider  aspirin EC 81 MG tablet Take 81 mg by mouth daily.    Yes Historical Provider, MD  atorvastatin (LIPITOR) 40 MG tablet Take 40 mg by mouth daily. 06/22/12  Yes Vesta Mixer, MD  carvedilol (COREG) 3.125 MG tablet Take 3.125 mg by mouth 2 (two) times daily with a meal. 10/21/12  Yes Vesta Mixer, MD  clopidogrel (PLAVIX) 75 MG tablet Take 75 mg by mouth daily. 04/06/13  Yes Vesta Mixer, MD  Fluticasone-Salmeterol (ADVAIR) 250-50 MCG/DOSE AEPB Inhale 1 puff into the lungs every 12 (twelve) hours.   Yes Historical Provider, MD  furosemide (LASIX) 40 MG tablet Take 40 mg by mouth daily. 04/13/13  Yes Vesta Mixer, MD  insulin NPH-regular (NOVOLIN 70/30) (70-30) 100 UNIT/ML injection Inject 20-25 Units into the skin daily with breakfast.   Yes Historical Provider, MD  lisinopril (PRINIVIL,ZESTRIL) 5 MG tablet Take 5 mg  by mouth 2 (two) times daily. 06/18/12  Yes Vesta Mixer, MD  nitroGLYCERIN (NITROSTAT) 0.4 MG SL tablet Place 0.4 mg under the tongue every 5 (five) minutes as needed. For chest pain    Historical Provider, MD   No Known Allergies  FAMILY HISTORY:  Family History  Problem Relation Age of Onset  . Heart attack Father   . Diabetes Father   . Breast cancer Mother    SOCIAL HISTORY:  reports that he has been smoking Cigarettes.  He started smoking about 13 months ago. He has a 25 pack-year smoking history. He has never used smokeless tobacco. He reports that he does not drink alcohol or use illicit drugs.  REVIEW OF SYSTEMS:   See HPI for pertinent positives.  All systems reviewed and are otherwise negative.   SUBJECTIVE:   VITAL SIGNS: Temp:  [98.1 F (36.7 C)-98.8 F (37.1 C)] 98.1 F (36.7 C) (10/23 1400) Pulse Rate:  [59-91] 70 (10/23 1400) Resp:  [14-29] 20 (10/23 1400) BP: (82-146)/(40-85) 90/40 mmHg (10/23 1400) SpO2:  [90 %-100 %] 98 % (10/23 1400) Weight:  [139 lb 4.8 oz (63.186 kg)] 139 lb 4.8 oz (63.186 kg) (10/22 1936)  PHYSICAL EXAMINATION: General:  Older than age appearing adult male in NAD Neuro:  AAOx4, speech clear, mild HOH HEENT:  Mm pink/moist, no jvd Neck: no LAN Cardiovascular:  s1s2 rrr, no m/r/g, distant tones Lungs:  resp's with prolonged resp phase, lungs bilaterally diminished with exp wheeze Abdomen:  Flat, soft, bsx4 active Musculoskeletal:  No acute deformities Skin:  Warm/dry, 1+ pitting edema in BLE, symmetrical   Recent Labs Lab 04/21/13 1435  NA 140  K 4.9  CL 97  CO2 33*  BUN 33*  CREATININE 1.19  GLUCOSE 285*    Recent Labs Lab 04/21/13 1435 04/22/13 0455  HGB 16.6 14.1  HCT 52.0 45.3  WBC 6.4 6.1  PLT 138* 125*   Dg Chest 2 View  04/21/2013   CLINICAL DATA:  Near syncopal event  EXAM: CHEST  2 VIEW  COMPARISON:  12/12/2011  FINDINGS: A defibrillator is again noted. The cardiac shadow is stable. Postsurgical  changes are seen. The lungs are clear bilaterally. Mild interstitial changes are noted. Hyperinflation is again seen.  IMPRESSION: COPD without acute abnormality.   Electronically Signed   By: Alcide Clever M.D.   On: 04/21/2013 14:29   Ct Head Wo Contrast  04/21/2013   CLINICAL DATA:  Near syncope.  EXAM: CT HEAD WITHOUT CONTRAST  TECHNIQUE: Contiguous axial images were obtained from the  base of the skull through the vertex without intravenous contrast.  COMPARISON:  None.  FINDINGS: No skull fracture or intracranial hemorrhage.  Prominent small vessel disease type changes without CT evidence of large acute infarct.  Atrophy without hydrocephalus.  No intracranial mass lesion noted on this unenhanced exam.  Prominent vascular calcifications.  IMPRESSION: No skull fracture or intracranial hemorrhage.  Prominent small vessel disease type changes without CT evidence of large acute infarct.  Atrophy without hydrocephalus.  Prominent vascular calcifications.   Electronically Signed   By: Bridgett Larsson M.D.   On: 04/21/2013 15:29    ASSESSMENT / PLAN:  Hypoxemia Elevated D-Dimer COPD  67 y/o M, current smoker, with known COPD (FEV1 50% in 2013) admitted with concerns of syncopal episode and ST segment changes.  Hypoxemia documented on admit on RA.  Patient is supposed to be on oxygen at baseline but does not wear and is unable to obtain Advair due to cost.  Suspect that his current situation is related to hypoxemia & noncompliance with COPD regimen.  Hypoxemia resolved with 4 L of oxygen.     PLAN: -assess CTA of chest to r/o embolic event as source of syncope with clear CXR & hypoxemia -premed with NS for renal protection (500 ml over 1 hour) -stress importance of oxygen compliance -have asked RN CM to review insurance coverage for nebs vs advair coverage at home -continue current dulera -add PRN albuerol neb -follow up in office with Dr. Sherene Sires post discharge -consider pulse steroids   Canary Brim, NP-C [facility] Pgr: [phone] or [phone]   04/22/2013, 2:49 PM  CTA for ?PE pending, however less than likely.  COPD is severe and patient is not compliant with medications, will attempt to get cheaper options for him.  Will definitely need to stop sedation and will need appointment with PCCM as outpatient.  Patient seen and examined, agree with above note.  I dictated the care and orders written for this patient under my direction.  Alyson Reedy, MD [phone]

## 2013-04-22 NOTE — Progress Notes (Signed)
ANTICOAGULATION CONSULT NOTE Pharmacy Consult for heparin Indication: chest pain/ACS  No Known Allergies  Patient Measurements: Wt= 64kg Ht= 5' 10'' IBW= 73  Vital Signs: Temp: 98.8 F (37.1 C) (10/22 2000) Temp src: Oral (10/22 1936) BP: 121/58 mmHg (10/22 1936) Pulse Rate: 91 (10/22 1936)  Labs:  Recent Labs  04/21/13 1435 04/21/13 1436 04/21/13 1807 04/22/13 0455  HGB 16.6  --   --  14.1  HCT 52.0  --   --  45.3  PLT 138*  --   --  125*  APTT 28  --   --   --   LABPROT 13.3  --   --   --   INR 1.03  --   --   --   HEPARINUNFRC  --   --   --  0.15*  CREATININE 1.19  --   --   --   TROPONINI  --  <0.30 <0.30  --     Estimated Creatinine Clearance: 53.8 ml/min (by C-G formula based on Cr of 1.19).  Assessment: 67 yo male here with syncope and possible ACS for heparin.  Goal of Therapy:  Heparin level 0.3-0.7 units/ml Monitor platelets by anticoagulation protocol: Yes   Plan:  Heparin 2000 units IV bolus, then increase heparin 1100 units/hr Check heparin level in 8 hours.  Eddie Candle   04/22/2013 5:55 AM

## 2013-04-22 NOTE — Progress Notes (Signed)
ADMISSION HISTORY AND PHYSICAL   Date: 04/22/2013               Patient Name:  James Moon MRN: [id number]  DOB: May 25, 1946 Age / Sex: 67 y.o., male        PCP: Hollice Espy Primary Cardiologist: Devarius Nelles         History of Present Illness: Patient is a 67 y.o. male with a PMHx of chronic systolic CHF, CAD, COPD, ICD , DM  who was admitted to [facility] on 04/21/2013 for evaluation of syncope.   He was at his sister's house today to help with some bill paying.  He says that he turned around too fast and lost his footing. It appears that he told the ER doctors earlier today that he became very dizzy and then passed out. He fell and cracked his nose and 4 and on the pavement.  He has an ICD that has the capability to monitor ST segment changes. He apparently had some ST segment changes earlier today and our Designer, industrial/product [facility]) received and alert. He apparently did not get the vibration that signals that his device is about an alert.  In the emergency room he has some mild ST segment depression in the inferior lateral leads with ST segment elevation in lead aVL. He denies any chest pain. He denies any shortness of breath. There was some documentation of such significant O2 saturation when his oxygen line was unplugged from a wall. His O2 sats were 60s.  He admits to eating a lot of salt. He eats TV dinners frequently.  No further pains. trooponin pending ecg to be done   Medications: Outpatient medications:  Current Facility-Administered Medications  Medication Dose Route Frequency Provider Last Rate Last Dose  . acetaminophen (TYLENOL) tablet 650 mg  650 mg Oral Q4H PRN Vesta Mixer, MD      . aspirin EC tablet 81 mg  81 mg Oral Daily Vesta Mixer, MD      . atorvastatin (LIPITOR) tablet 40 mg  40 mg Oral Daily Vesta Mixer, MD      . carvedilol (COREG) tablet 3.125 mg  3.125 mg Oral BID WC Vesta Mixer, MD   3.125 mg at 04/22/13 0803  . clopidogrel  (PLAVIX) tablet 75 mg  75 mg Oral Q breakfast Vesta Mixer, MD   75 mg at 04/22/13 0803  . furosemide (LASIX) tablet 40 mg  40 mg Oral Daily Vesta Mixer, MD      . heparin ADULT infusion 100 units/mL (25000 units/250 mL)  1,100 Units/hr Intravenous Continuous Vesta Mixer, MD 11 mL/hr at 04/22/13 0624 1,100 Units/hr at 04/22/13 0624  . [START ON 04/23/2013] influenza vac split quadrivalent PF (FLUARIX) injection 0.5 mL  0.5 mL Intramuscular Tomorrow-1000 Vesta Mixer, MD      . insulin aspart protamine- aspart (NOVOLOG MIX 70/30) injection 20 Units  20 Units Subcutaneous Q breakfast Vesta Mixer, MD   20 Units at 04/22/13 0805  . lisinopril (PRINIVIL,ZESTRIL) tablet 5 mg  5 mg Oral BID Vesta Mixer, MD   5 mg at 04/21/13 2125  . mometasone-formoterol (DULERA) 100-5 MCG/ACT inhaler 2 puff  2 puff Inhalation BID Vesta Mixer, MD   2 puff at 04/21/13 2141  . nitroGLYCERIN (NITROSTAT) SL tablet 0.4 mg  0.4 mg Sublingual Q5 min PRN Vesta Mixer, MD      . ondansetron [facility]) injection 4 mg  4 mg Intravenous Q6H PRN Vesta Mixer, MD         No Known Allergies   Past Medical History  Diagnosis Date  . Diabetes mellitus   . Colon cancer     s/p colectomy   . Hypertension   . CAD (coronary artery disease)     CABG in 1996  . NSTEMI (non-ST elevated myocardial infarction) Dec. 11, 2012    DES x 2 to SVG to 1st and 2nd OM and SVG to PDA  . Ischemic cardiomyopathy Dec. 2012    EF is 20 to 25%>30-35% echo 3/13  . Tobacco abuse   . COPD (chronic obstructive pulmonary disease)   . CHF (congestive heart failure)   . Shortness of breath     Past Surgical History  Procedure Laterality Date  . Colon surgery    . Open heart surgery    . Coronary stent placement  Dec. 2012    DES to SVG to 1st and 2nd OM and SVG to the PDA  . Coronary artery bypass graft    . Coronary angioplasty    . Colon surgery      FOR HISTORY OF COLON CANCER    Family History  Problem  Relation Age of Onset  . Heart attack Father   . Diabetes Father   . Breast cancer Mother     Social History:  reports that he has been smoking Cigarettes.  He started smoking about 13 months ago. He has a 25 pack-year smoking history. He has never used smokeless tobacco. He reports that he does not drink alcohol or use illicit drugs.   Physical Exam: BP 95/47  Pulse 75  Temp(Src) 98.5 F (36.9 C) (Oral)  Resp 18  Ht 5\' 10"  (1.778 m)  Wt 139 lb 4.8 oz (63.186 kg)  BMI 19.99 kg/m2  SpO2 94%  General: Vital signs reviewed and noted. Well-developed, well-nourished, in no acute distress; alert, appropriate and cooperative throughout examination.  Head: Normocephalic,  He has trauma to his nose and forehead  Neck: Supple. Negative for carotid bruits. JVD not elevated.  Lungs:  Few bilateral wheezes  Heart: RR with S1 S2. No murmurs, rubs, or gallops appreciated.  Abdomen:  Soft, non-tender, non-distended with normoactive bowel sounds. No hepatomegaly. No rebound/guarding. No obvious abdominal masses  MSK: Strength and the appear normal for age.  Extremities: No clubbing or cyanosis. 1+ edema. ( improved)     Distal pedal pulses are 2+ and equal bilaterally.  Neurologic: Alert and oriented X 3. Moves all extremities spontaneously.  His history seems to vary somewhat.   Psych:       Lab results: Basic Metabolic Panel:  Recent Labs Lab 04/21/13 1435  NA 140  K 4.9  CL 97  CO2 33*  GLUCOSE 285*  BUN 33*  CREATININE 1.19  CALCIUM 9.4    Liver Function Tests:  Recent Labs Lab 04/21/13 1435  AST 23  ALT 15  ALKPHOS 72  BILITOT 0.7  PROT 7.5  ALBUMIN 3.7   No results found for this basename: LIPASE, AMYLASE,  in the last 168 hours  CBC:  Recent Labs Lab 04/21/13 1435 04/22/13 0455  WBC 6.4 6.1  NEUTROABS 5.0  --   HGB 16.6 14.1  HCT 52.0 45.3  MCV 103.6* 102.7*  PLT 138* 125*    Cardiac Enzymes:  Recent Labs Lab 04/21/13 1436 04/21/13 1807    TROPONINI <0.30 <0.30    BNP: No components found with this  basename: POCBNP,   CBG:  Recent Labs Lab 04/21/13 1945 04/21/13 2136 04/22/13 0755  GLUCAP 270* 349* 77    Coagulation Studies:  Recent Labs  04/21/13 1435  LABPROT 13.3  INR 1.03     Other results: EKG :  NSR with ST depression in the inferior and lateral leads.  ST elevation in aVL.  Imaging: Dg Chest 2 View  04/21/2013   CLINICAL DATA:  Near syncopal event  EXAM: CHEST  2 VIEW  COMPARISON:  12/12/2011  FINDINGS: A defibrillator is again noted. The cardiac shadow is stable. Postsurgical changes are seen. The lungs are clear bilaterally. Mild interstitial changes are noted. Hyperinflation is again seen.  IMPRESSION: COPD without acute abnormality.   Electronically Signed   By: Alcide Clever M.D.   On: 04/21/2013 14:29   Ct Head Wo Contrast  04/21/2013   CLINICAL DATA:  Near syncope.  EXAM: CT HEAD WITHOUT CONTRAST  TECHNIQUE: Contiguous axial images were obtained from the base of the skull through the vertex without intravenous contrast.  COMPARISON:  None.  FINDINGS: No skull fracture or intracranial hemorrhage.  Prominent small vessel disease type changes without CT evidence of large acute infarct.  Atrophy without hydrocephalus.  No intracranial mass lesion noted on this unenhanced exam.  Prominent vascular calcifications.  IMPRESSION: No skull fracture or intracranial hemorrhage.  Prominent small vessel disease type changes without CT evidence of large acute infarct.  Atrophy without hydrocephalus.  Prominent vascular calcifications.   Electronically Signed   By: Bridgett Larsson M.D.   On: 04/21/2013 15:29       Assessment & Plan:  1. Syncopal episode: The patient presents with an episode of probable syncope. His history has changed somewhat throughout the day. He initially told emergent Dr. Claiborne Billings he became dizzy and then passed out. Today when I arrived he stated that he stumbled and lost his  footing.  Abdomen Mr. Sky for a long time , and  Know that sometimes his history can be somewhat unreliable.  Interestingly, he has an ICD that is designed to monitor ST segment changes. He has been having some ST segment changes throughout the day and her research coordinator received a notice that he was having ST segment depression.  He denies receiving any vibration that would indicate that his device since about a warning.  His initial troponin level is negative. We will collect serial  troponin levels. We'll observe him overnight. He needs something from a congestive heart failure standpoint anyway.   If his Troponin level is OK , will DC him to home.  He will follow up with EP / device clinic. Needs to ambulate before going home.  2. Chronic systolic congestive heart failure: the patient has been chronic systolic congestive heart failure. He is very noncompliant with his diet. He eats TV dinners almost every night and eats lots of salty foods.  We will give him some IV Lasix during this admission and we'll try to get him to reduce the salt in his diet.  3. Coronary artery disease: the patient has a history of known coronary disease. Denies any chest pain but he has had some ST segment depression in the inferior leads as well as ST segment elevation in aVL. We'll collect cardiac enzymes. DVT PPX -   4. COPD: The patient continues to smoke.   Vesta Mixer, Montez Hageman., MD, [facility] 04/22/2013, 8:32 AM

## 2013-04-23 ENCOUNTER — Encounter (HOSPITAL_COMMUNITY): Payer: Self-pay | Admitting: Physician Assistant

## 2013-04-23 DIAGNOSIS — R55 Syncope and collapse: Secondary | ICD-10-CM

## 2013-04-23 LAB — BASIC METABOLIC PANEL
Calcium: 9.1 mg/dL (ref 8.4–10.5)
Creatinine, Ser: 1.1 mg/dL (ref 0.50–1.35)
GFR calc non Af Amer: 68 mL/min — ABNORMAL LOW (ref >90)
Glucose, Bld: 169 mg/dL — ABNORMAL HIGH (ref 70–99)
Potassium: 5 mEq/L (ref 3.5–5.1)
Sodium: 138 mEq/L (ref 135–145)

## 2013-04-23 LAB — CBC
Hemoglobin: 15 g/dL (ref 13.0–17.0)
MCH: 31.9 pg (ref 26.0–34.0)
MCHC: 31 g/dL (ref 30.0–36.0)
Platelets: 120 10*3/uL — ABNORMAL LOW (ref 150–400)

## 2013-04-23 LAB — GLUCOSE, CAPILLARY
Glucose-Capillary: 146 mg/dL — ABNORMAL HIGH (ref 70–99)
Glucose-Capillary: 245 mg/dL — ABNORMAL HIGH (ref 70–99)
Glucose-Capillary: 169 mg/dL — ABNORMAL HIGH (ref 70–99)

## 2013-04-23 MED ORDER — ALBUTEROL SULFATE (2.5 MG/3ML) 0.083% IN NEBU: 2.5000 mg | INHALATION_SOLUTION | Freq: Four times a day (QID) | RESPIRATORY_TRACT | Status: DC | PRN

## 2013-04-23 MED ORDER — IPRATROPIUM-ALBUTEROL 20-100 MCG/ACT IN AERS: 1.0000 | INHALATION_SPRAY | Freq: Four times a day (QID) | RESPIRATORY_TRACT | Status: DC

## 2013-04-23 MED ORDER — IPRATROPIUM BROMIDE 0.02 % IN SOLN: 500.0000 ug | Freq: Four times a day (QID) | RESPIRATORY_TRACT | Status: DC

## 2013-04-23 NOTE — Progress Notes (Signed)
PULMONARY  / CRITICAL CARE MEDICINE  Name: James Moon MRN: [id number] DOB: Dec 24, 1945    ADMISSION DATE:  04/21/2013 CONSULTATION DATE:  04/22/13  REFERRING MD :  Dr. Elease Hashimoto PRIMARY SERVICE:  Cardiology  CHIEF COMPLAINT:  Hypoxemia  BRIEF PATIENT DESCRIPTION: 67 y/o M, current smoker, with PMH of COPD (FEV1 50% 09/2011) admitted on 10/22 after a questionable syncopal episode striking face on pavement.  PCCM consulted for hypoxemia  SIGNIFICANT EVENTS / STUDIES:  10/22 - Admit after ? Syncopal episode, new ST depression in inferior lateral leads, requiring 3L O2 10/23 - PCCM consulted for hypoxemia.  CTA Chest negative for PE  CULTURES:  ANTIBIOTICS:  SUBJECTIVE:   VITAL SIGNS: Temp:  [98 F (36.7 C)-98.5 F (36.9 C)] 98.5 F (36.9 C) (10/24 0453) Pulse Rate:  [70-83] 82 (10/24 0822) Resp:  [19-20] 19 (10/24 0453) BP: (90-108)/(40-61) 102/61 mmHg (10/24 0822) SpO2:  [92 %-98 %] 92 % (10/24 0902)  PHYSICAL EXAMINATION: General:  Older than age appearing adult male in NAD Neuro:  AAOx4, speech clear, mild HOH HEENT:  Mm pink/moist, no jvd Neck: no LAN Cardiovascular:  s1s2 rrr, no m/r/g, distant tones Lungs:  resp's with prolonged resp phase, lungs bilaterally diminished with exp wheeze Abdomen:  Flat, soft, bsx4 active Musculoskeletal:  No acute deformities Skin:  Warm/dry, 1+ pitting edema in BLE, symmetrical   Recent Labs Lab 04/21/13 1435 04/23/13 0514  NA 140 138  K 4.9 5.0  CL 97 97  CO2 33* 32  BUN 33* 24*  CREATININE 1.19 1.10  GLUCOSE 285* 169*    Recent Labs Lab 04/21/13 1435 04/22/13 0455 04/23/13 0514  HGB 16.6 14.1 15.0  HCT 52.0 45.3 48.4  WBC 6.4 6.1 7.9  PLT 138* 125* 120*   Dg Chest 2 View  04/21/2013   CLINICAL DATA:  Near syncopal event  EXAM: CHEST  2 VIEW  COMPARISON:  12/12/2011  FINDINGS: A defibrillator is again noted. The cardiac shadow is stable. Postsurgical changes are seen. The lungs are clear bilaterally. Mild  interstitial changes are noted. Hyperinflation is again seen.  IMPRESSION: COPD without acute abnormality.   Electronically Signed   By: Alcide Clever M.D.   On: 04/21/2013 14:29   Ct Head Wo Contrast  04/21/2013   CLINICAL DATA:  Near syncope.  EXAM: CT HEAD WITHOUT CONTRAST  TECHNIQUE: Contiguous axial images were obtained from the base of the skull through the vertex without intravenous contrast.  COMPARISON:  None.  FINDINGS: No skull fracture or intracranial hemorrhage.  Prominent small vessel disease type changes without CT evidence of large acute infarct.  Atrophy without hydrocephalus.  No intracranial mass lesion noted on this unenhanced exam.  Prominent vascular calcifications.  IMPRESSION: No skull fracture or intracranial hemorrhage.  Prominent small vessel disease type changes without CT evidence of large acute infarct.  Atrophy without hydrocephalus.  Prominent vascular calcifications.   Electronically Signed   By: Bridgett Larsson M.D.   On: 04/21/2013 15:29   Ct Angio Chest Pe W/cm &/or Wo Cm  04/22/2013   CLINICAL DATA:  Hypoxia, syncope  EXAM: CT ANGIOGRAPHY CHEST WITH CONTRAST  TECHNIQUE: Multidetector CT imaging of the chest was performed using the standard protocol during bolus administration of intravenous contrast. Multiplanar CT image reconstructions including MIPs were obtained to evaluate the vascular anatomy.  CONTRAST:  OMNIPAQUE IOHEXOL 350 MG/ML SOLN  COMPARISON:  Chest x-ray April 21, 2013  FINDINGS: There is no pulmonary embolus. There is no mediastinal or hilar  lymphadenopathy. The heart size is normal. There is no pericardial effusion. There is mild dependent atelectasis of the bilateral posterior lung bases, right greater than left. There is no focal pneumonia or pleural effusion. The visualized upper abdominal structures are unremarkable.  Review of the MIP images confirms the above findings.  IMPRESSION: No pulmonary embolus. Mild dependent atelectasis of the posterior  lungs.   Electronically Signed   By: Sherian Rein M.D.   On: 04/22/2013 17:01    ASSESSMENT / PLAN:  Hypoxemia Elevated D-Dimer COPD  67 y/o M, current smoker, with known COPD (FEV1 50% in 2013) admitted with concerns of syncopal episode and ST segment changes.  Hypoxemia documented on admit on RA.  Patient is supposed to be on oxygen at baseline but does not wear and is unable to obtain Advair due to cost.  Suspect that his current situation is related to hypoxemia & noncompliance with COPD regimen. Hypoxemia resolved with 4 L of oxygen. CTA negative for PE 10/23.     PLAN: -stress importance of oxygen compliance -continue current dulera while inpatient, see med recs for discharge below -add PRN albuerol neb -follow up in office with Dr. Sherene Sires post discharge (arranged)  Discharge Plan: Reviewed with patient.  Will d/c on Q6 albuterol / atrovent (either via neb or combivent but not both at same time).  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  Will arrange for home oxygen / nebulizer machine.  All of these meds are on the 4$ list at [facility].    Canary Brim, NP-C [facility] Pgr: [phone] or [phone] 04/23/2013, 1:52 PM  Levy Pupa, MD, PhD 04/23/2013, 2:48 PM [facility] [phone] or if no answer [phone]

## 2013-04-23 NOTE — Progress Notes (Signed)
NURSING PROGRESS NOTE  James Moon [id number] Discharge Data: 04/23/2013 6:58 PM Attending Provider: No att. providers found WUJ:WJXBJ,YNWGN Windell Moulding, MD     [name] to be D/C'd Home per MD order.  Discussed with the patient and daughter the After Visit Summary and all questions fully answered. All IV's discontinued with no bleeding noted. Telemetry discontinued. All belongings returned to patient for patient to take home.   Last Vital Signs:  Blood pressure 107/55, pulse 60, temperature 98.6 F (37 C), temperature source Oral, resp. rate 17, height 5\' 10"  (1.778 m), weight 63.186 kg (139 lb 4.8 oz), SpO2 96.00%.  Discharge Medication List   Medication List    STOP taking these medications       Fluticasone-Salmeterol 250-50 MCG/DOSE Aepb  Commonly known as:  ADVAIR     lisinopril 5 MG tablet  Commonly known as:  PRINIVIL,ZESTRIL      TAKE these medications       albuterol (2.5 MG/3ML) 0.083% nebulizer solution  Commonly known as:  PROVENTIL  Take 3 mLs (2.5 mg total) by nebulization every 6 (six) hours as needed for wheezing.     aspirin EC 81 MG tablet  Take 81 mg by mouth daily.     atorvastatin 40 MG tablet  Commonly known as:  LIPITOR  Take 40 mg by mouth daily.     carvedilol 3.125 MG tablet  Commonly known as:  COREG  Take 3.125 mg by mouth 2 (two) times daily with a meal.     clopidogrel 75 MG tablet  Commonly known as:  PLAVIX  Take 75 mg by mouth daily.     furosemide 40 MG tablet  Commonly known as:  LASIX  Take 40 mg by mouth daily.     insulin NPH-regular (70-30) 100 UNIT/ML injection  Commonly known as:  NOVOLIN 70/30  Inject 20-25 Units into the skin daily with breakfast.     ipratropium 0.02 % nebulizer solution  Commonly known as:  ATROVENT  Take 2.5 mLs (500 mcg total) by nebulization every 6 (six) hours.     Ipratropium-Albuterol 20-100 MCG/ACT Aers respimat  Commonly known as:  COMBIVENT  Inhale 1 puff into the lungs every 6  (six) hours.     nitroGLYCERIN 0.4 MG SL tablet  Commonly known as:  NITROSTAT  Place 0.4 mg under the tongue every 5 (five) minutes as needed. For chest pain

## 2013-04-23 NOTE — Progress Notes (Signed)
ADMISSION HISTORY AND PHYSICAL   Date: 04/23/2013               Patient Name:  James Moon MRN: [id number]  DOB: 07-13-1945 Age / Sex: 67 y.o., male        PCP: Hollice Espy Primary Cardiologist: Nahser         History of Present Illness: Patient is a 67 y.o. male with a PMHx of chronic systolic CHF, CAD, COPD, ICD , DM  who was admitted to [facility] on 04/21/2013 for evaluation of syncope.   He was at his sister's house today to help with some bill paying.  He says that he turned around too fast and lost his footing. It appears that he told the ER doctors earlier today that he became very dizzy and then passed out. He fell and cracked his nose and 4 and on the pavement.  He has an ICD that has the capability to monitor ST segment changes. He apparently had some ST segment changes earlier today and our Designer, industrial/product [facility]) received and alert. He apparently did not get the vibration that signals that his device is about an alert.  In the emergency room he has some mild ST segment depression in the inferior lateral leads with ST segment elevation in lead aVL. He denies any chest pain. He denies any shortness of breath. There was some documentation of such significant O2 saturation when his oxygen line was unplugged from a wall. His O2 sats were 60s.  He admits to eating a lot of salt. He eats TV dinners frequently.  No further pains.  Troponin is negative.     Medications: Outpatient medications:  Current Facility-Administered Medications  Medication Dose Route Frequency Provider Last Rate Last Dose  . acetaminophen (TYLENOL) tablet 650 mg  650 mg Oral Q4H PRN Vesta Mixer, MD      . albuterol (PROVENTIL) (5 MG/ML) 0.5% nebulizer solution 2.5 mg  2.5 mg Nebulization Q3H PRN Jeanella Craze, NP      . aspirin EC tablet 81 mg  81 mg Oral Daily Vesta Mixer, MD   81 mg at 04/22/13 0955  . atorvastatin (LIPITOR) tablet 40 mg  40 mg Oral Daily Vesta Mixer,  MD   40 mg at 04/22/13 0955  . carvedilol (COREG) tablet 3.125 mg  3.125 mg Oral BID WC Dayna N Dunn, PA-C   3.125 mg at 04/23/13 4098  . clopidogrel (PLAVIX) tablet 75 mg  75 mg Oral Q breakfast Vesta Mixer, MD   75 mg at 04/23/13 1191  . furosemide (LASIX) tablet 40 mg  40 mg Oral Daily Vesta Mixer, MD   40 mg at 04/22/13 0955  . insulin aspart (novoLOG) injection 0-9 Units  0-9 Units Subcutaneous TID WC Dayna N Dunn, PA-C   1 Units at 04/23/13 4782  . insulin aspart protamine- aspart (NOVOLOG MIX 70/30) injection 20 Units  20 Units Subcutaneous Q breakfast Vesta Mixer, MD   20 Units at 04/23/13 [phone]  . mometasone-formoterol (DULERA) 100-5 MCG/ACT inhaler 2 puff  2 puff Inhalation BID Vesta Mixer, MD   2 puff at 04/22/13 2112  . nitroGLYCERIN (NITROSTAT) SL tablet 0.4 mg  0.4 mg Sublingual Q5 min PRN Vesta Mixer, MD      . ondansetron [facility]) injection 4 mg  4 mg Intravenous Q6H PRN Vesta Mixer, MD         No Known Allergies  Past Medical History  Diagnosis Date  . Diabetes mellitus   . Colon cancer     s/p colectomy   . Hypertension   . CAD (coronary artery disease)     a. CABG in 1996. b. NSTEMI 06/2011: DES x 2 to SVG to 1st and 2nd OM and SVG to PDA.  . Ischemic cardiomyopathy Dec. 2012    a. EF 20 to 25% 2012. b. 30-35% echo 3/13.  . Tobacco abuse   . COPD (chronic obstructive pulmonary disease)   . Chronic systolic CHF (congestive heart failure)     Past Surgical History  Procedure Laterality Date  . Colon surgery    . Open heart surgery    . Coronary stent placement  Dec. 2012    DES to SVG to 1st and 2nd OM and SVG to the PDA  . Coronary artery bypass graft    . Coronary angioplasty    . Colon surgery      FOR HISTORY OF COLON CANCER    Family History  Problem Relation Age of Onset  . Heart attack Father   . Diabetes Father   . Breast cancer Mother     Social History:  reports that he has been smoking Cigarettes.  He started smoking  about 13 months ago. He has a 25 pack-year smoking history. He has never used smokeless tobacco. He reports that he does not drink alcohol or use illicit drugs.   Physical Exam: BP 102/61  Pulse 82  Temp(Src) 98.5 F (36.9 C) (Oral)  Resp 19  Ht 5\' 10"  (1.778 m)  Wt 139 lb 4.8 oz (63.186 kg)  BMI 19.99 kg/m2  SpO2 93%  General: Vital signs reviewed and noted. Well-developed, well-nourished, in no acute distress; alert, appropriate and cooperative throughout examination.  Head: Normocephalic,  He has trauma to his nose and forehead  Neck: Supple. Negative for carotid bruits. JVD not elevated.  Lungs:  Few bilateral wheezes  Heart: RR with S1 S2. No murmurs, rubs, or gallops appreciated.  Abdomen:  Soft, non-tender, non-distended with normoactive bowel sounds. No hepatomegaly. No rebound/guarding. No obvious abdominal masses  MSK: Strength and the appear normal for age.  Extremities: No clubbing or cyanosis. 1+ edema. ( improved)     Distal pedal pulses are 2+ and equal bilaterally.  Neurologic: Alert and oriented X 3. Moves all extremities spontaneously.  His history seems to vary somewhat.   Psych:       Lab results: Basic Metabolic Panel:  Recent Labs Lab 04/21/13 1435 04/23/13 0514  NA 140 138  K 4.9 5.0  CL 97 97  CO2 33* 32  GLUCOSE 285* 169*  BUN 33* 24*  CREATININE 1.19 1.10  CALCIUM 9.4 9.1    Liver Function Tests:  Recent Labs Lab 04/21/13 1435  AST 23  ALT 15  ALKPHOS 72  BILITOT 0.7  PROT 7.5  ALBUMIN 3.7   No results found for this basename: LIPASE, AMYLASE,  in the last 168 hours  CBC:  Recent Labs Lab 04/21/13 1435 04/22/13 0455 04/23/13 0514  WBC 6.4 6.1 7.9  NEUTROABS 5.0  --   --   HGB 16.6 14.1 15.0  HCT 52.0 45.3 48.4  MCV 103.6* 102.7* 103.0*  PLT 138* 125* 120*    Cardiac Enzymes:  Recent Labs Lab 04/21/13 1436 04/21/13 1807 04/22/13 0955  TROPONINI <0.30 <0.30 <0.30    BNP: No components found with this basename:  POCBNP,   CBG:  Recent Labs Lab 04/22/13  1610 04/22/13 1136 04/22/13 1654 04/22/13 2044 04/23/13 0727  GLUCAP 77 118* 161* 218* 146*    Coagulation Studies:  Recent Labs  04/21/13 1435  LABPROT 13.3  INR 1.03     Other results: EKG :  NSR with ST depression in the inferior and lateral leads.  ST elevation in aVL.  Imaging: Dg Chest 2 View  04/21/2013   CLINICAL DATA:  Near syncopal event  EXAM: CHEST  2 VIEW  COMPARISON:  12/12/2011  FINDINGS: A defibrillator is again noted. The cardiac shadow is stable. Postsurgical changes are seen. The lungs are clear bilaterally. Mild interstitial changes are noted. Hyperinflation is again seen.  IMPRESSION: COPD without acute abnormality.   Electronically Signed   By: Alcide Clever M.D.   On: 04/21/2013 14:29   Ct Head Wo Contrast  04/21/2013   CLINICAL DATA:  Near syncope.  EXAM: CT HEAD WITHOUT CONTRAST  TECHNIQUE: Contiguous axial images were obtained from the base of the skull through the vertex without intravenous contrast.  COMPARISON:  None.  FINDINGS: No skull fracture or intracranial hemorrhage.  Prominent small vessel disease type changes without CT evidence of large acute infarct.  Atrophy without hydrocephalus.  No intracranial mass lesion noted on this unenhanced exam.  Prominent vascular calcifications.  IMPRESSION: No skull fracture or intracranial hemorrhage.  Prominent small vessel disease type changes without CT evidence of large acute infarct.  Atrophy without hydrocephalus.  Prominent vascular calcifications.   Electronically Signed   By: Bridgett Larsson M.D.   On: 04/21/2013 15:29   Ct Angio Chest Pe W/cm &/or Wo Cm  04/22/2013   CLINICAL DATA:  Hypoxia, syncope  EXAM: CT ANGIOGRAPHY CHEST WITH CONTRAST  TECHNIQUE: Multidetector CT imaging of the chest was performed using the standard protocol during bolus administration of intravenous contrast. Multiplanar CT image reconstructions including MIPs were obtained to evaluate  the vascular anatomy.  CONTRAST:  OMNIPAQUE IOHEXOL 350 MG/ML SOLN  COMPARISON:  Chest x-ray April 21, 2013  FINDINGS: There is no pulmonary embolus. There is no mediastinal or hilar lymphadenopathy. The heart size is normal. There is no pericardial effusion. There is mild dependent atelectasis of the bilateral posterior lung bases, right greater than left. There is no focal pneumonia or pleural effusion. The visualized upper abdominal structures are unremarkable.  Review of the MIP images confirms the above findings.  IMPRESSION: No pulmonary embolus. Mild dependent atelectasis of the posterior lungs.   Electronically Signed   By: Sherian Rein M.D.   On: 04/22/2013 17:01       Assessment & Plan:  1. Syncopal episode: The patient presents with an episode of probable syncope.   He has ruled out form MI.  No arrhythmias on ICD.   He may have just stumbled and tripped.  He has severe COPD and may have had a syncopal episode related to hypoxemia.  He has had hypoxemia here.  CT r/o PE but shows significant , severe COPD.  He has been seen by pulmonary and needs to get back on his meds and needs to stop smoking.   2. Chronic systolic congestive heart failure: the patient has been chronic systolic congestive heart failure. He is very noncompliant with his diet. He eats TV dinners almost every night and eats lots of salty foods.  We will give him some IV Lasix during this admission and we'll try to get him to reduce the salt in his diet.  3. Coronary artery disease: the patient has a history  of known coronary disease. Denies any chest pain but he has had some ST segment depression in the inferior leads as well as ST segment elevation in aVL. We'll collect cardiac enzymes. DVT PPX -   4. COPD: The patient continues to smoke.  He has not been taking his COPD meds.  He will need to seen Dr. Sherene Sires .    DC on 4 liters home O2 or as otherwise directed by pulmonary.    Vesta Mixer, Montez Hageman., MD,  [facility] 04/23/2013, 8:34 AM

## 2013-04-23 NOTE — Clinical Documentation Improvement (Signed)
THIS DOCUMENT IS NOT A PERMANENT PART OF THE MEDICAL RECORD  Please update your documentation with the medical record to reflect your response to this query. If you need help knowing how to do this please call [phone].  04/23/13   Dear Ronie Spies PA,  Per chart this patient has severe COPD noncompliant with medication and syncope likely related to hypoxia. In DC summary 10/23:"This admission O2 requirement was assessed - on 04/22/13, he was 81% on RA coming up to 91-92% on 4L, and required 6L O2 to stay at 88% and above on ambulation. The pt reported being off home O2 in accordance with prior pulmonology note. " Please clarify condition described and treated. Thank you.  Possible Clinical Conditions? - Chronic respiratory failure - Acute respiratory failure - Acute on chronic respiratory failure - other (please specify)  You may use possible, probable, or suspect with inpatient documentation. possible, probable, suspected diagnoses MUST be documented at the time of discharge  Reviewed: additional documentation in the medical record  Thank You,  Beverley Fiedler RN BSN Clinical Documentation Specialist: [phone] Health Information Management [place]

## 2013-04-30 ENCOUNTER — Ambulatory Visit: Payer: Medicare Other | Admitting: Internal Medicine

## 2013-05-06 ENCOUNTER — Encounter: Payer: Medicare Other | Admitting: Physician Assistant

## 2013-05-07 ENCOUNTER — Encounter: Payer: Self-pay | Admitting: Cardiology

## 2013-05-07 ENCOUNTER — Ambulatory Visit (INDEPENDENT_AMBULATORY_CARE_PROVIDER_SITE_OTHER): Payer: Medicare Other | Admitting: Cardiology

## 2013-05-07 VITALS — BP 118/56 | HR 57 | Ht 70.0 in | Wt 142.2 lb

## 2013-05-07 DIAGNOSIS — T50904A Poisoning by unspecified drugs, medicaments and biological substances, undetermined, initial encounter: Secondary | ICD-10-CM

## 2013-05-07 DIAGNOSIS — I9589 Other hypotension: Secondary | ICD-10-CM

## 2013-05-07 DIAGNOSIS — R55 Syncope and collapse: Secondary | ICD-10-CM

## 2013-05-07 DIAGNOSIS — I952 Hypotension due to drugs: Secondary | ICD-10-CM

## 2013-05-07 DIAGNOSIS — I2510 Atherosclerotic heart disease of native coronary artery without angina pectoris: Secondary | ICD-10-CM

## 2013-05-07 DIAGNOSIS — I5022 Chronic systolic (congestive) heart failure: Secondary | ICD-10-CM

## 2013-05-07 DIAGNOSIS — Z95810 Presence of automatic (implantable) cardiac defibrillator: Secondary | ICD-10-CM

## 2013-05-07 DIAGNOSIS — I255 Ischemic cardiomyopathy: Secondary | ICD-10-CM

## 2013-05-07 DIAGNOSIS — I2589 Other forms of chronic ischemic heart disease: Secondary | ICD-10-CM

## 2013-05-07 NOTE — Progress Notes (Signed)
ELECTROPHYSIOLOGY OFFICE NOTE  Patient ID: LYCAN DAVEE MRN: [id number], DOB/AGE: 1946/04/18   Date of Visit: 05/07/2013  Primary Physician: Hollice Espy, MD Primary Cardiologist / Primary EP: Elease Hashimoto, MD / Graciela Husbands, MD Reason for Visit: Hospital follow-up  History of Present Illness  HOBERT POPLASKI is a 67 y.o. male with chronic systolic HF, ICM s/p ICD, CAD, COPD and DM who presented to [facility] on 04/21/2013 with syncope. He was found to have hypotension and hypoxemia. He is enrolled in the Analyze ST trial and our research RN received and alert to indicate he had ST elevation recorded by his ICD. On 12-lead ECG in the ED he had ST segment depression and TW inversions inferiorly. He was observed for 2 days. MI ruled out with serial troponins. D-dimer was elevated so CTA of chest was performed and negative for PE. His COPD regimen was optimized with addition of nebs and inhalers. Lisinopril was discontinued due to hypotension.  He presents today for hospital followup. Since discharge, he reports he is doing well and has no complaints. He denies chest pain or shortness of breath. He denies palpitations, dizziness, near syncope or syncope. He denies LE swelling, orthopnea, PND or recent weight gain. He is compliant and tolerating medications without difficulty.  Past Medical History Past Medical History  Diagnosis Date  . Diabetes mellitus   . Colon cancer     s/p colectomy   . Hypertension   . CAD (coronary artery disease)     a. CABG in 1996. b. NSTEMI 06/2011: DES x 2 to SVG to 1st and 2nd OM and SVG to PDA.  . Ischemic cardiomyopathy Dec. 2012    a. EF 20 to 25% 2012. b. 30-35% echo 3/13.  . Tobacco abuse   . COPD (chronic obstructive pulmonary disease)   . Chronic systolic CHF (congestive heart failure)   . Chronic respiratory failure     a. On home O2 - due to COPD.    Past Surgical History Past Surgical History  Procedure Laterality Date  . Colon surgery      . Open heart surgery    . Coronary stent placement  Dec. 2012    DES to SVG to 1st and 2nd OM and SVG to the PDA  . Coronary artery bypass graft    . Coronary angioplasty    . Colon surgery      FOR HISTORY OF COLON CANCER    Allergies/Intolerances No Known Allergies  Current Home Medications Current Outpatient Prescriptions  Medication Sig Dispense Refill  . albuterol (PROVENTIL) (2.5 MG/3ML) 0.083% nebulizer solution Take 3 mLs (2.5 mg total) by nebulization every 6 (six) hours as needed for wheezing.  75 mL  12  . aspirin EC 81 MG tablet Take 81 mg by mouth daily.       Marland Kitchen atorvastatin (LIPITOR) 40 MG tablet Take 40 mg by mouth daily.      . carvedilol (COREG) 3.125 MG tablet Take 3.125 mg by mouth 2 (two) times daily with a meal.      . clopidogrel (PLAVIX) 75 MG tablet Take 75 mg by mouth daily.      . furosemide (LASIX) 40 MG tablet Take 40 mg by mouth daily.      . insulin NPH-regular (NOVOLIN 70/30) (70-30) 100 UNIT/ML injection Inject 20-25 Units into the skin daily with breakfast.      . ipratropium (ATROVENT) 0.02 % nebulizer solution Take 2.5 mLs (500 mcg total) by nebulization every 6 (  six) hours.  75 mL  12  . Ipratropium-Albuterol (COMBIVENT) 20-100 MCG/ACT AERS respimat Inhale 1 puff into the lungs every 6 (six) hours.  1 Inhaler  12  . nitroGLYCERIN (NITROSTAT) 0.4 MG SL tablet Place 0.4 mg under the tongue every 5 (five) minutes as needed. For chest pain       No current facility-administered medications for this visit.    Social History Social History  . Marital Status: Widowed   Social History Main Topics  . Smoking status: Current Every Day Smoker -- 0.50 packs/day for 50 years    Types: Cigarettes    Start date: 02/26/2012  . Smokeless tobacco: Never Used     Comment: he has tried to quit many times.  . Alcohol Use: No  . Drug Use: No   Review of Systems General: No chills, fever, night sweats or weight changes Cardiovascular: No chest pain, dyspnea  on exertion, edema, orthopnea, palpitations, paroxysmal nocturnal dyspnea Dermatological: No rash, lesions or masses Respiratory: No cough, dyspnea Urologic: No hematuria, dysuria Abdominal: No nausea, vomiting, diarrhea, bright red blood per rectum, melena, or hematemesis Neurologic: No visual changes, weakness, changes in mental status All other systems reviewed and are otherwise negative except as noted above.  Physical Exam Vitals: Blood pressure 118/56, pulse 57, height 5\' 10"  (1.778 m), weight 142 lb 3.2 oz (64.501 kg).  General: Well developed, well appearing 67 y.o. male in no acute distress. HEENT: Normocephalic, atraumatic. EOMs intact. Sclera nonicteric. Oropharynx clear.  Neck: Supple. No JVD. Lungs: Respirations regular and unlabored, CTA bilaterally. No wheezes, rales or rhonchi. Heart: RRR. S1, S2 present. No murmurs, rub, S3 or S4. Abdomen: Soft, non-distended.  Extremities: No clubbing, cyanosis or edema. PT/Radials 2+ and equal bilaterally. Psych: Normal affect. Neuro: Alert and oriented X 3. Moves all extremities spontaneously.   Diagnostics 12-lead ECG - poor quality; sinus bradycardia at 57 bpm; lateral T wave inversions; unchanged when compared to previous ECGs  Device interrogation - not needed today per research  Assessment and Plan 1. Syncope, in setting of hypotension - lisinopril discontinued - BP improved - no recurrence 2. Ischemic CM s/p ICD implant - continue ICD follow-up per research protocol 3. Chronic systolic HF - stable; euvolemic by exam - continue medical therapy; ACEI d/c'd due to hypotension 4. CAD - stable without anginal symptoms  Signed, Meriam Chojnowski, PA-C 05/07/2013, 11:12 AM  .

## 2013-05-10 ENCOUNTER — Ambulatory Visit (INDEPENDENT_AMBULATORY_CARE_PROVIDER_SITE_OTHER): Payer: Medicare Other | Admitting: Internal Medicine

## 2013-05-10 ENCOUNTER — Encounter: Payer: Self-pay | Admitting: Internal Medicine

## 2013-05-10 VITALS — BP 100/64 | HR 74 | Temp 98.3°F | Ht 70.0 in | Wt 142.4 lb

## 2013-05-10 DIAGNOSIS — J9610 Chronic respiratory failure, unspecified whether with hypoxia or hypercapnia: Secondary | ICD-10-CM

## 2013-05-10 DIAGNOSIS — F17200 Nicotine dependence, unspecified, uncomplicated: Secondary | ICD-10-CM

## 2013-05-10 DIAGNOSIS — J449 Chronic obstructive pulmonary disease, unspecified: Secondary | ICD-10-CM

## 2013-05-10 MED ORDER — IPRATROPIUM-ALBUTEROL 20-100 MCG/ACT IN AERS: 1.0000 | INHALATION_SPRAY | Freq: Four times a day (QID) | RESPIRATORY_TRACT | Status: DC

## 2013-05-10 NOTE — Progress Notes (Signed)
Subjective:     Patient ID: James Moon, male   DOB: [birth year]    MRN: [id number]  Brief patient profile:  52 yowm quit smoking at admit to [facility] 06/07/11 with chf/ihd/copd dx by pft's at FEV1 35% 06/2011 up to 50% 10/22/2011    History of Present Illness  06/26/2011 hosp  f/u ov/Austina Constantin cc no sob on 02 24h per day, not smoking, resuming nl desired activities with no limitations so far. No cough rec Ok to use the 02  And the nebulizer as needed for now When your advair runs out, change to Advair 250 twice daily  until your visit    05/10/2013 f/u ov/Terrian Ridlon re: GOLD III copd resumed smoking / not using advair or 02  Chief Complaint  Patient presents with  . Follow-up    Breathing doing well.    Not limited from desired activities on just prn duoneb/combivent    No obvious daytime variabilty or cough  cp or chest tightness, subjective wheeze overt sinus or hb symptoms. No unusual exp hx      Sleeping ok without nocturnal  or early am exacerbation  of respiratory  c/o's or need for noct saba. Also denies any obvious fluctuation of symptoms with weather or environmental changes or other aggravating or alleviating factors except as outlined above   ROS  At present neg for  any significant sore throat, dysphagia, itching, sneezing,  nasal congestion or excess/ purulent secretions,  fever, chills, sweats, unintended wt loss, pleuritic or exertional cp, hempoptysis, orthopnea pnd or leg swelling.  Also denies presyncope, palpitations, heartburn, abdominal pain, nausea, vomiting, diarrhea  or change in bowel or urinary habits, dysuria,hematuria,  rash, arthralgias, visual complaints, headache, numbness weakness or ataxia.         Objective:   Physical Exam    pleasant amb wm nad Wt  06/26/2011  158 > 07/24/2011  161 > 10/22/2011  162 > 03/18/2012  160 > 05/10/2013  142  HEENT mild turbinate edema.  Oropharynx no thrush or excess pnd or cobblestoning.  No JVD or cervical adenopathy. Mild  accessory muscle hypertrophy. Trachea midline, nl thryroid. Chest was hyperinflated by percussion with diminished breath sounds and moderate increased exp time without wheeze. Hoover sign positive at end  inspiration. Regular rate and rhythm without murmur gallop or rub or increase P2 or edema.  Abd: no hsm, nl excursion. Ext warm without cyanosis or clubbing.      Cta 04/22/13  No pulmonary embolus. Mild dependent atelectasis of the posterior  lungs.      Assessment:

## 2013-05-10 NOTE — Patient Instructions (Addendum)
Please see patient coordinator before you leave today  to schedule your 0xygen to be picked up   If any trouble with your breathing use combivent up  To one puff 4 x daily - let me know if problem with new device  Only use your albuterol/ipratropium Nebulizer  as a rescue medication to be used if you can't catch your breath by resting or doing a relaxed purse lip breathing pattern.  - The less you use it, the better it will work when you need it. - Ok to use up to every 4 hours if you must but call for immediate appointment if use goes up over your usual need  The key is to stop smoking completely before smoking completely stops you!    If you are satisfied with your treatment plan let your doctor know and he/she can either refill your medications or you can return here when your prescription runs out.     If in any way you are not 100% satisfied,  please tell us.  If 100% better, tell your friends!

## 2013-05-12 DIAGNOSIS — F17200 Nicotine dependence, unspecified, uncomplicated: Secondary | ICD-10-CM | POA: Insufficient documentation

## 2013-05-12 NOTE — Assessment & Plan Note (Signed)
>   3 min  I reviewed the Flethcher curve with patient that basically indicates  if you quit smoking when your best day FEV1 is still well relatively well preserved 9as is the case here)  it is highly unlikely you will progress to severe disease and informed the patient there was no medication on the market that has proven to change the curve or the likelihood of progression.  Therefore stopping smoking and maintaining abstinence is the most important aspect of care, not choice of inhalers or for that matter, doctors.    Pulmonary f/u is prn

## 2013-05-12 NOTE — Assessment & Plan Note (Signed)
Pt declining 02 > d/c'd

## 2013-05-12 NOTE — Assessment & Plan Note (Signed)
-   PFT's 06/12/11 FEV1  1.20 (35%) ratio is 50   DLC0  110%   - PFT's 10/22/2011 FEV1 1.56 (50%) ratio is 47  And no better with B2 and DLCO 58% corrects to 69%   - 06/26/2011  Walked RA x 3 laps @ 185 ft each stopped due to  End of study, sats 88%  Despite smoking he is relatively asymptomatic and not having sign aecopd so ok to just use combivent /duoneb prn  See smoking discussed separately

## 2013-05-17 ENCOUNTER — Ambulatory Visit (INDEPENDENT_AMBULATORY_CARE_PROVIDER_SITE_OTHER): Payer: Medicare Other | Admitting: *Deleted

## 2013-05-17 ENCOUNTER — Encounter: Payer: Self-pay | Admitting: Internal Medicine

## 2013-05-17 DIAGNOSIS — I255 Ischemic cardiomyopathy: Secondary | ICD-10-CM

## 2013-05-17 DIAGNOSIS — I2589 Other forms of chronic ischemic heart disease: Secondary | ICD-10-CM

## 2013-05-17 DIAGNOSIS — Z95810 Presence of automatic (implantable) cardiac defibrillator: Secondary | ICD-10-CM

## 2013-05-18 LAB — MDC_IDC_ENUM_SESS_TYPE_REMOTE
Brady Statistic RV Percent Paced: 1 %
HighPow Impedance: 81 Ohm
Lead Channel Impedance Value: 400 Ohm
Lead Channel Setting Pacing Pulse Width: 0.5 ms
Zone Setting Detection Interval: 250 ms
Zone Setting Detection Interval: 300 ms
Zone Setting Detection Interval: 350 ms

## 2013-06-08 ENCOUNTER — Encounter: Payer: Self-pay | Admitting: *Deleted

## 2013-07-05 ENCOUNTER — Other Ambulatory Visit: Payer: Self-pay

## 2013-07-05 MED ORDER — CLOPIDOGREL BISULFATE 75 MG PO TABS: 75.0000 mg | ORAL_TABLET | Freq: Every day | ORAL | Status: DC

## 2013-07-05 MED ORDER — ATORVASTATIN CALCIUM 40 MG PO TABS: 40.0000 mg | ORAL_TABLET | Freq: Every day | ORAL | Status: DC

## 2013-08-09 ENCOUNTER — Ambulatory Visit (INDEPENDENT_AMBULATORY_CARE_PROVIDER_SITE_OTHER): Payer: Medicare Other | Admitting: *Deleted

## 2013-08-09 DIAGNOSIS — I2589 Other forms of chronic ischemic heart disease: Secondary | ICD-10-CM

## 2013-08-09 DIAGNOSIS — Z95810 Presence of automatic (implantable) cardiac defibrillator: Secondary | ICD-10-CM

## 2013-08-09 DIAGNOSIS — I255 Ischemic cardiomyopathy: Secondary | ICD-10-CM

## 2013-08-09 LAB — MDC_IDC_ENUM_SESS_TYPE_INCLINIC
Battery Remaining Longevity: 86.4 mo
Brady Statistic RV Percent Paced: 0 %
Date Time Interrogation Session: 20150209131913
HighPow Impedance: 69.75 Ohm
Lead Channel Impedance Value: 375 Ohm
Lead Channel Pacing Threshold Amplitude: 0.5 V
MDC IDC MSMT LEADCHNL RV PACING THRESHOLD AMPLITUDE: 0.5 V
MDC IDC MSMT LEADCHNL RV PACING THRESHOLD PULSEWIDTH: 0.5 ms
MDC IDC MSMT LEADCHNL RV PACING THRESHOLD PULSEWIDTH: 0.5 ms
MDC IDC MSMT LEADCHNL RV SENSING INTR AMPL: 12 mV
MDC IDC PG SERIAL: 827666
MDC IDC SET LEADCHNL RV PACING AMPLITUDE: 2.5 V
MDC IDC SET LEADCHNL RV PACING PULSEWIDTH: 0.5 ms
MDC IDC SET LEADCHNL RV SENSING SENSITIVITY: 0.5 mV
Zone Setting Detection Interval: 250 ms
Zone Setting Detection Interval: 300 ms
Zone Setting Detection Interval: 350 ms

## 2013-08-09 NOTE — Progress Notes (Signed)
ICD check in clinic by [place]. Normal device function. Thresholds and sensing consistent with previous device measurements. Impedance trends stable over time. No evidence of any ventricular arrhythmias. Histogram distribution appropriate for patient and level of activity. No changes made this session. Device programmed at appropriate safety margins. Device programmed to optimize intrinsic conduction. Estimated longevity 7.68yrs. Pt enrolled in remote follow-up.   [facility] Analyze 11/10/13 & ROV w/ Dr. Caryl Comes 01/2014.

## 2013-09-13 ENCOUNTER — Encounter: Payer: Self-pay | Admitting: Internal Medicine

## 2013-10-04 ENCOUNTER — Other Ambulatory Visit: Payer: Self-pay | Admitting: *Deleted

## 2013-10-04 MED ORDER — CLOPIDOGREL BISULFATE 75 MG PO TABS: 75.0000 mg | ORAL_TABLET | Freq: Every day | ORAL | Status: DC

## 2013-10-14 ENCOUNTER — Telehealth: Payer: Self-pay | Admitting: Cardiovascular Disease

## 2013-10-14 NOTE — Telephone Encounter (Signed)
New message      Both legs swollen and oozing liquid.  Pt saids he legs sweats so bad that it wets his pants and shoes.  Pt takes a diuretic also.  Should he take more medication?

## 2013-10-14 NOTE — Telephone Encounter (Signed)
C/o ankles/ legs swollen, red, leaking fluid so much that it is wetting his pants, states he is getting scabs on his legs from the weeping, denies warmth in the legs, denies SOB, pt states he does not have a PCP, he was advised to go to Urgent care on [address]. He was made a regular 6 mo ov with Dr Acie Fredrickson.  He agreed to plan

## 2013-10-14 NOTE — Telephone Encounter (Signed)
Lmtcb/ number provided

## 2013-10-14 NOTE — Telephone Encounter (Signed)
Follow Up:  Pt states he is returning Jodette's call.... Attempted calling team A and Team B.. Unable to reach [place]. Sending msg in [place].

## 2013-10-15 ENCOUNTER — Encounter (HOSPITAL_COMMUNITY): Payer: Self-pay | Admitting: Emergency Medicine

## 2013-10-15 ENCOUNTER — Ambulatory Visit (INDEPENDENT_AMBULATORY_CARE_PROVIDER_SITE_OTHER): Payer: Medicare Other | Admitting: Family Medicine

## 2013-10-15 ENCOUNTER — Ambulatory Visit: Payer: Medicare Other

## 2013-10-15 ENCOUNTER — Observation Stay (HOSPITAL_COMMUNITY): Payer: Medicare Other

## 2013-10-15 ENCOUNTER — Observation Stay (HOSPITAL_COMMUNITY)
Admission: EM | Admit: 2013-10-15 | Discharge: 2013-10-17 | Disposition: A | Discharge status: Home / Self Care | Payer: Medicare Other | Attending: Family Medicine | Admitting: Family Medicine

## 2013-10-15 VITALS — BP 114/58 | HR 98 | Temp 98.0°F | Resp 20 | Wt 150.0 lb

## 2013-10-15 DIAGNOSIS — E119 Type 2 diabetes mellitus without complications: Secondary | ICD-10-CM | POA: Insufficient documentation

## 2013-10-15 DIAGNOSIS — J449 Chronic obstructive pulmonary disease, unspecified: Secondary | ICD-10-CM

## 2013-10-15 DIAGNOSIS — Z923 Personal history of irradiation: Secondary | ICD-10-CM | POA: Insufficient documentation

## 2013-10-15 DIAGNOSIS — Z9981 Dependence on supplemental oxygen: Secondary | ICD-10-CM | POA: Insufficient documentation

## 2013-10-15 DIAGNOSIS — I2589 Other forms of chronic ischemic heart disease: Secondary | ICD-10-CM | POA: Insufficient documentation

## 2013-10-15 DIAGNOSIS — R600 Localized edema: Secondary | ICD-10-CM

## 2013-10-15 DIAGNOSIS — Z9221 Personal history of antineoplastic chemotherapy: Secondary | ICD-10-CM | POA: Insufficient documentation

## 2013-10-15 DIAGNOSIS — Z85038 Personal history of other malignant neoplasm of large intestine: Secondary | ICD-10-CM | POA: Insufficient documentation

## 2013-10-15 DIAGNOSIS — I872 Venous insufficiency (chronic) (peripheral): Secondary | ICD-10-CM | POA: Insufficient documentation

## 2013-10-15 DIAGNOSIS — I252 Old myocardial infarction: Secondary | ICD-10-CM | POA: Insufficient documentation

## 2013-10-15 DIAGNOSIS — Z7982 Long term (current) use of aspirin: Secondary | ICD-10-CM | POA: Insufficient documentation

## 2013-10-15 DIAGNOSIS — I509 Heart failure, unspecified: Secondary | ICD-10-CM | POA: Insufficient documentation

## 2013-10-15 DIAGNOSIS — R0600 Dyspnea, unspecified: Secondary | ICD-10-CM

## 2013-10-15 DIAGNOSIS — Z9861 Coronary angioplasty status: Secondary | ICD-10-CM | POA: Insufficient documentation

## 2013-10-15 DIAGNOSIS — R609 Edema, unspecified: Secondary | ICD-10-CM

## 2013-10-15 DIAGNOSIS — I2510 Atherosclerotic heart disease of native coronary artery without angina pectoris: Secondary | ICD-10-CM | POA: Insufficient documentation

## 2013-10-15 DIAGNOSIS — R0902 Hypoxemia: Secondary | ICD-10-CM

## 2013-10-15 DIAGNOSIS — Z95810 Presence of automatic (implantable) cardiac defibrillator: Secondary | ICD-10-CM | POA: Insufficient documentation

## 2013-10-15 DIAGNOSIS — J4489 Other specified chronic obstructive pulmonary disease: Secondary | ICD-10-CM | POA: Insufficient documentation

## 2013-10-15 DIAGNOSIS — Z7902 Long term (current) use of antithrombotics/antiplatelets: Secondary | ICD-10-CM | POA: Insufficient documentation

## 2013-10-15 DIAGNOSIS — L97909 Non-pressure chronic ulcer of unspecified part of unspecified lower leg with unspecified severity: Secondary | ICD-10-CM | POA: Insufficient documentation

## 2013-10-15 DIAGNOSIS — Z9049 Acquired absence of other specified parts of digestive tract: Secondary | ICD-10-CM | POA: Insufficient documentation

## 2013-10-15 DIAGNOSIS — I5023 Acute on chronic systolic (congestive) heart failure: Principal | ICD-10-CM | POA: Insufficient documentation

## 2013-10-15 DIAGNOSIS — Z951 Presence of aortocoronary bypass graft: Secondary | ICD-10-CM | POA: Insufficient documentation

## 2013-10-15 DIAGNOSIS — Z794 Long term (current) use of insulin: Secondary | ICD-10-CM | POA: Insufficient documentation

## 2013-10-15 DIAGNOSIS — J9610 Chronic respiratory failure, unspecified whether with hypoxia or hypercapnia: Secondary | ICD-10-CM | POA: Insufficient documentation

## 2013-10-15 LAB — BASIC METABOLIC PANEL
BUN: 19 mg/dL (ref 6–23)
CO2: 34 mEq/L — ABNORMAL HIGH (ref 19–32)
Calcium: 8.9 mg/dL (ref 8.4–10.5)
Chloride: 95 mEq/L — ABNORMAL LOW (ref 96–112)
Creatinine, Ser: 1.07 mg/dL (ref 0.50–1.35)
GFR calc Af Amer: 81 mL/min — ABNORMAL LOW (ref >90)
GFR calc non Af Amer: 70 mL/min — ABNORMAL LOW (ref >90)
Glucose, Bld: 211 mg/dL — ABNORMAL HIGH (ref 70–99)
Potassium: 4.4 mEq/L (ref 3.7–5.3)
Sodium: 141 mEq/L (ref 137–147)

## 2013-10-15 LAB — CBC WITH DIFFERENTIAL/PLATELET
Basophils Absolute: 0 10*3/uL (ref 0.0–0.1)
Basophils Relative: 1 % (ref 0–1)
Eosinophils Absolute: 0.1 10*3/uL (ref 0.0–0.7)
Eosinophils Relative: 1 % (ref 0–5)
HCT: 51.7 % (ref 39.0–52.0)
Hemoglobin: 15.2 g/dL (ref 13.0–17.0)
Lymphocytes Relative: 10 % — ABNORMAL LOW (ref 12–46)
Lymphs Abs: 0.6 10*3/uL — ABNORMAL LOW (ref 0.7–4.0)
MCH: 26.6 pg (ref 26.0–34.0)
MCHC: 29.4 g/dL — ABNORMAL LOW (ref 30.0–36.0)
MCV: 90.5 fL (ref 78.0–100.0)
Monocytes Absolute: 0.5 10*3/uL (ref 0.1–1.0)
Monocytes Relative: 7 % (ref 3–12)
Neutro Abs: 5.3 10*3/uL (ref 1.7–7.7)
Neutrophils Relative %: 82 % — ABNORMAL HIGH (ref 43–77)
Platelets: 103 10*3/uL — ABNORMAL LOW (ref 150–400)
RBC: 5.71 MIL/uL (ref 4.22–5.81)
RDW: 16.9 % — ABNORMAL HIGH (ref 11.5–15.5)
WBC: 6.5 10*3/uL (ref 4.0–10.5)

## 2013-10-15 LAB — D-DIMER, QUANTITATIVE (NOT AT ARMC): D DIMER QUANT: 1.44 ug{FEU}/mL — AB (ref 0.00–0.48)

## 2013-10-15 LAB — TROPONIN I: Troponin I: <0.3 ng/mL (ref <0.30)

## 2013-10-15 LAB — PRO B NATRIURETIC PEPTIDE: Pro B Natriuretic peptide (BNP): 2626 pg/mL — ABNORMAL HIGH (ref 0–125)

## 2013-10-15 MED ORDER — ALBUTEROL SULFATE (2.5 MG/3ML) 0.083% IN NEBU: 2.5000 mg | INHALATION_SOLUTION | Freq: Four times a day (QID) | RESPIRATORY_TRACT | Status: DC | PRN

## 2013-10-15 MED ORDER — CEFAZOLIN SODIUM 1-5 GM-% IV SOLN
1.0000 g | Freq: Once | INTRAVENOUS | Status: AC
  Administered 2013-10-15: 1 g via INTRAVENOUS
  Filled 2013-10-15: qty 50

## 2013-10-15 MED ORDER — HYDROCODONE-ACETAMINOPHEN 5-325 MG PO TABS
2.0000 | ORAL_TABLET | Freq: Once | ORAL | Status: AC
  Administered 2013-10-15: 2 via ORAL
  Filled 2013-10-15: qty 2

## 2013-10-15 MED ORDER — FUROSEMIDE 10 MG/ML IJ SOLN
40.0000 mg | INTRAMUSCULAR | Status: AC
  Administered 2013-10-15: 40 mg via INTRAVENOUS
  Filled 2013-10-15: qty 4

## 2013-10-15 MED ORDER — SODIUM CHLORIDE 0.9 % IJ SOLN: 3.0000 mL | INTRAMUSCULAR | Status: DC | PRN

## 2013-10-15 MED ORDER — SODIUM CHLORIDE 0.9 % IJ SOLN
3.0000 mL | Freq: Two times a day (BID) | INTRAMUSCULAR | Status: DC
  Administered 2013-10-15 – 2013-10-17 (×3): 3 mL via INTRAVENOUS

## 2013-10-15 MED ORDER — INSULIN ASPART 100 UNIT/ML ~~LOC~~ SOLN
0.0000 [IU] | Freq: Every day | SUBCUTANEOUS | Status: DC
  Administered 2013-10-16: 2 [IU] via SUBCUTANEOUS
  Administered 2013-10-16: 4 [IU] via SUBCUTANEOUS

## 2013-10-15 MED ORDER — CARVEDILOL 3.125 MG PO TABS
3.1250 mg | ORAL_TABLET | Freq: Two times a day (BID) | ORAL | Status: DC
  Administered 2013-10-15 – 2013-10-17 (×4): 3.125 mg via ORAL
  Filled 2013-10-15 (×6): qty 1

## 2013-10-15 MED ORDER — IOHEXOL 350 MG/ML SOLN
100.0000 mL | Freq: Once | INTRAVENOUS | Status: AC | PRN
  Administered 2013-10-15: 100 mL via INTRAVENOUS

## 2013-10-15 MED ORDER — CLOPIDOGREL BISULFATE 75 MG PO TABS
75.0000 mg | ORAL_TABLET | Freq: Every day | ORAL | Status: DC
  Administered 2013-10-16 – 2013-10-17 (×2): 75 mg via ORAL
  Filled 2013-10-15 (×2): qty 1

## 2013-10-15 MED ORDER — HYDROCODONE-ACETAMINOPHEN 5-325 MG PO TABS
1.0000 | ORAL_TABLET | Freq: Once | ORAL | Status: AC
  Administered 2013-10-15: 1 via ORAL
  Filled 2013-10-15: qty 1

## 2013-10-15 MED ORDER — IPRATROPIUM-ALBUTEROL 0.5-2.5 (3) MG/3ML IN SOLN
3.0000 mL | Freq: Four times a day (QID) | RESPIRATORY_TRACT | Status: DC
  Filled 2013-10-15: qty 3

## 2013-10-15 MED ORDER — FUROSEMIDE 10 MG/ML IJ SOLN: 40.0000 mg | Freq: Once | INTRAMUSCULAR | Status: DC

## 2013-10-15 MED ORDER — SODIUM CHLORIDE 0.9 % IJ SOLN
3.0000 mL | Freq: Two times a day (BID) | INTRAMUSCULAR | Status: DC
  Administered 2013-10-16 – 2013-10-17 (×2): 3 mL via INTRAVENOUS

## 2013-10-15 MED ORDER — INSULIN ASPART 100 UNIT/ML ~~LOC~~ SOLN
0.0000 [IU] | Freq: Three times a day (TID) | SUBCUTANEOUS | Status: DC
  Administered 2013-10-16: 5 [IU] via SUBCUTANEOUS
  Administered 2013-10-16: 11 [IU] via SUBCUTANEOUS
  Administered 2013-10-16: 3 [IU] via SUBCUTANEOUS
  Administered 2013-10-17 (×2): 8 [IU] via SUBCUTANEOUS

## 2013-10-15 MED ORDER — ATORVASTATIN CALCIUM 40 MG PO TABS
40.0000 mg | ORAL_TABLET | Freq: Every day | ORAL | Status: DC
  Administered 2013-10-16 – 2013-10-17 (×2): 40 mg via ORAL
  Filled 2013-10-15 (×2): qty 1

## 2013-10-15 MED ORDER — SODIUM CHLORIDE 0.9 % IV SOLN: 250.0000 mL | INTRAVENOUS | Status: DC | PRN

## 2013-10-15 MED ORDER — HEPARIN SODIUM (PORCINE) 5000 UNIT/ML IJ SOLN
5000.0000 [IU] | Freq: Three times a day (TID) | INTRAMUSCULAR | Status: DC
  Administered 2013-10-15 – 2013-10-17 (×5): 5000 [IU] via SUBCUTANEOUS
  Filled 2013-10-15 (×6): qty 1

## 2013-10-15 MED ORDER — ASPIRIN EC 81 MG PO TBEC
81.0000 mg | DELAYED_RELEASE_TABLET | Freq: Every day | ORAL | Status: DC
  Administered 2013-10-16 – 2013-10-17 (×2): 81 mg via ORAL
  Filled 2013-10-15 (×2): qty 1

## 2013-10-15 NOTE — ED Notes (Signed)
Attempted report x 2 

## 2013-10-15 NOTE — ED Notes (Signed)
Pacemaker being interrogated at bedside by staff. Pt given ice chips per PA & pt request.

## 2013-10-15 NOTE — ED Notes (Signed)
Phlebotomy notified   

## 2013-10-15 NOTE — Progress Notes (Signed)
Pt. C/o cramping to BLE. On call MD for [facility] Medicine Teaching service made aware. New orders received. RN will implement as ordered and continue to monitor pt.for changes in condition. Gillis Santa Rolf Fells

## 2013-10-15 NOTE — Progress Notes (Signed)
[facility] [address] [place] [address] [phone]  Date:  10/15/2013   Name:  James Moon   DOB:  [birth year]   MRN:  [id number]  PCP:  Marjorie Smolder, MD    Chief Complaint: Foot Swelling and Edema   History of Present Illness:  James Moon is a 68 y.o. very pleasant male patient who presents with the following:  Here as a new patient today.  He has multiple medical problems including CHF, recent NSTEMI, COPD, IDDM.   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  He admits he has felt more SOB lately- he can walk about 50 yards before he gets out of breath. He has orthopnea but this is stable.   Pulmonary- Dr. Melvyn Novas.   AICD placed 11/2011 per Dr. Caryl Comes.   He last saw Dr. Acie Fredrickson as an outpatient in June 2014, was hospitalized in October for a fall, chronic CHF, CAD, COPD  Patient Active Problem List   Diagnosis Date Noted  . Smoker 05/12/2013  . Acute on chronic systolic CHF (congestive heart failure) 04/22/2013  . Chronic respiratory failure 04/22/2013  . Edema, peripheral 12/09/2012  . 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 Dyspnea 06/26/2011  . COPD GOLD III 06/13/2011  . CAD (coronary artery disease) 06/07/2011  . Chronic systolic heart failure 49/44/9675  . IDDM (insulin dependent diabetes mellitus) 06/07/2011  . Ischemic cardiomyopathy 06/01/2011    Past Medical History  Diagnosis Date  . Diabetes mellitus   . Colon cancer     s/p colectomy   . Hypertension   . CAD (coronary artery disease)     a. CABG in 1996. b. NSTEMI 06/2011: DES x 2 to SVG to 1st and 2nd OM and SVG to PDA.  . Ischemic cardiomyopathy Dec. 2012    a. EF 20 to 25% 2012. b. 30-35% echo 3/13.  . Tobacco abuse   . COPD (chronic obstructive pulmonary  disease)   . Chronic systolic CHF (congestive heart failure)   . Chronic respiratory failure     a. On home O2 - due to COPD.    Past Surgical History  Procedure Laterality Date  . Colon surgery    . Open heart surgery    . Coronary stent placement  Dec. 2012    DES to SVG to 1st and 2nd OM and SVG to the PDA  . Coronary artery bypass graft    . Coronary angioplasty    . Colon surgery      FOR HISTORY OF COLON CANCER    History  Substance Use Topics  . Smoking status: Current Every Day Smoker -- 0.50 packs/day for 50 years    Types: Cigarettes    Start date: 02/26/2012  . Smokeless tobacco: Never Used     Comment: he has tried to quit many times.  . Alcohol Use: No    Family History  Problem Relation Age of Onset  . Heart attack Father   . Diabetes Father   . Breast cancer Mother     No Known Allergies  Medication list has been reviewed and updated.  Current Outpatient Prescriptions on File Prior to Visit  Medication Sig Dispense Refill  . albuterol (PROVENTIL) (2.5 MG/3ML) 0.083% nebulizer solution  Take 3 mLs (2.5 mg total) by nebulization every 6 (six) hours as needed for wheezing.  75 mL  12  . aspirin EC 81 MG tablet Take 81 mg by mouth daily.       Marland Kitchen atorvastatin (LIPITOR) 40 MG tablet Take 1 tablet (40 mg total) by mouth daily.  90 tablet  1  . carvedilol (COREG) 3.125 MG tablet Take 3.125 mg by mouth 2 (two) times daily with a meal.      . clopidogrel (PLAVIX) 75 MG tablet Take 1 tablet (75 mg total) by mouth daily.  90 tablet  0  . furosemide (LASIX) 40 MG tablet Take 40 mg by mouth daily.      . insulin NPH-regular (NOVOLIN 70/30) (70-30) 100 UNIT/ML injection Inject 20-25 Units into the skin daily with breakfast.      . ipratropium (ATROVENT) 0.02 % nebulizer solution Take 2.5 mLs (500 mcg total) by nebulization every 6 (six) hours.  75 mL  12  . Ipratropium-Albuterol (COMBIVENT) 20-100 MCG/ACT AERS respimat Inhale 1 puff into the lungs every 6 (six) hours.   1 Inhaler  12  . nitroGLYCERIN (NITROSTAT) 0.4 MG SL tablet Place 0.4 mg under the tongue every 5 (five) minutes as needed. For chest pain       No current facility-administered medications on file prior to visit.    Review of Systems:  As per HPI- otherwise negative. Admits he does not use oxygen at home; per his report he was told he no longer needs to do this.   Physical Examination: Filed Vitals:   10/15/13 1156  BP: 102/56  Pulse: 105  Temp: 98 F (36.7 C)  Resp: 20   Filed Vitals:   10/15/13 1156  Weight: 150 lb (68.04 kg)   Body mass index is 21.52 kg/(m^2). Ideal Body Weight:    GEN: WDWN, NAD, Non-toxic, A & O x 3, thin, looks better than would be expected from his hypoxemia HEENT: Atraumatic, Normocephalic. Neck supple. No masses, No LAD. Ears and Nose: No external deformity. CV: RRR, No M/G/R. No JVD. No thrill. No extra heart sounds.  defib in place PULM: CTA B, no wheezes, crackles, rhonchi. No retractions. No resp. distress. No accessory muscle use. ABD: S, NT, ND, +BS. No rebound. No HSM. EXTR: No c/c.  Both LE are grossly edematous to the knees, with pitting edema and fluid leakage NEURO Normal gait.  PSYCH: Normally interactive. Conversant. Not depressed or anxious appearing.  Calm demeanor.   [facility] reading (PRIMARY) by  Dr. Lorelei Pont. CXR: no obvious pleural effusion.  Some chronic scarring and congestion, pacemaker/ defib in place.   Pt placed on 2L [place]- pulse ox to 94%.   Recheck VS- pulse 98, BP  IV started right wrist at [place] and Plan: Lower extremity edema - Plan: DG Chest 2 View  Hypoxemia - Plan: DG Chest 2 View  Levonte is here today with acute hypoxemia and LE edema- likely due to CHF exacerbation.  His hypoxemia is such that we will transfer him to the ED via EMS.  He is ok with this plan   Signed Lamar Blinks, MD

## 2013-10-15 NOTE — H&P (Signed)
[facility] Admission History and Physical Service Pager: [phone]  Patient name: James Moon Medical record number: [id number] Date of birth: 1946/01/24 Age: 68 y.o. Gender: male  Primary Care Provider: Marjorie Smolder, MD Consultants: none Code Status: full  Chief Complaint: LE edema and shortness of breath  Assessment and Plan: MALEKE FERIA is a 68 y.o. male presenting with LE edema and shortness of breath . PMH is significant for HFrEF, COPD, CAD, DM.  # LE edema and shortness of breath and hypoxemia: on going for the past 1-2 months with a history of reduced EF and associated orthopnea and elevated BNP makes CHF exacerbation the most likely cause. Must also consider COPD exacerbation (though no wheezes) vs MI (EKG without ischemic changes and negative troponin) vs PE/DVT (possible given new onset hypoxia, though less likely given bilateral nature of LE edema) -will admit to tele, attending Dr Mingo Amber -will dose additional 40 mg IV lasix now -strict I/O's and daily weights -check troponins and d-dimer -continue supplemental O2 -albuterol prn -will repeat echo tomorrow -will continue home coreg, consider optimization of HF medications pending echo -concern for cellulitic changes in LE by EDPA, though patient with bilateral skin changes consistent with the amount of volume in his LE - will hold further antibiotics at this time as the patient has no fever and no elevated WBC  # Hx of CAD: no chest pain at this time -will continue home plavix and ASA -continue home lipitor  # DM: last A1c 8.5 2012 -will check A1c in am -SSI and qac/qhs cbgs  FEN/GI: carb mod diet, kvo Prophylaxis: heparin SQ  Disposition: admit to tele, discharge pending improvement in O2 status  History of Present Illness: James Moon is a 68 y.o. male presenting with LE edema and shortness of breath.  Patient presented to [facility] today for evaluation of  his LE edema. He notes over the past 1-2 months he has had increased swelling in his bilateral LE and with fluid leaking out of them. There is no pain, though he notes that the leaking fluid seeps out onto his pants while he is at work. He notes mild shortness of breath with walking and orthopnea, though he states this is mild, he sleeps in a recliner. No PND. NO recent fever, nausea, vomiting, or diarrhea. No recent O2 requirement, he does note being on this previously after his NSTEMI, though came off of this several months ago. He denies any recent trips, pain in his legs, history of blood clots. He notes a history of colon cancer ~15 years ago for which he had resection, radiation, and chemo. He notes smoking from the age of 21 with highest use of 2 ppd, though now down to 3-5/day. No alcohol or illicit drug use.  Last echo 09/09/11 EF 30-35%, severe hypokinesis in anterior myocardium and distal anteroseptal and apical myocardium. BNP 2626 in the ED. Troponin negative. He was given a dose of ancef in the ED for presumed cellulitis. Also given 40 mg lasix IV.  Review Of Systems: Per HPI with the following additions: none Otherwise 12 point review of systems was performed and was unremarkable.  Patient Active Problem List   Diagnosis Date Noted  . Hypoxemia 10/15/2013  . CHF exacerbation 10/15/2013  . Smoker 05/12/2013  . Acute on chronic systolic CHF (congestive heart failure) 04/22/2013  . Chronic respiratory failure 04/22/2013  . Edema, peripheral 12/09/2012  . 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 Dyspnea 06/26/2011  .  COPD GOLD III 06/13/2011  . CAD (coronary artery disease) 06/07/2011  . Chronic systolic heart failure 71/11/2692  . IDDM (insulin dependent diabetes mellitus) 06/07/2011  . Ischemic cardiomyopathy 06/01/2011   Past Medical History: Past Medical History  Diagnosis Date  . Diabetes mellitus   . Colon cancer     s/p colectomy   . Hypertension   . CAD  (coronary artery disease)     a. CABG in 1996. b. NSTEMI 06/2011: DES x 2 to SVG to 1st and 2nd OM and SVG to PDA.  . Ischemic cardiomyopathy Dec. 2012    a. EF 20 to 25% 2012. b. 30-35% echo 3/13.  . Tobacco abuse   . COPD (chronic obstructive pulmonary disease)   . Chronic systolic CHF (congestive heart failure)   . Chronic respiratory failure     a. On home O2 - due to COPD.   Past Surgical History: Past Surgical History  Procedure Laterality Date  . Colon surgery    . Open heart surgery    . Coronary stent placement  Dec. 2012    DES to SVG to 1st and 2nd OM and SVG to the PDA  . Coronary artery bypass graft    . Coronary angioplasty    . Colon surgery      FOR HISTORY OF COLON CANCER   Social History: History  Substance Use Topics  . Smoking status: Current Every Day Smoker -- 0.50 packs/day for 50 years    Types: Cigarettes    Start date: 02/26/2012  . Smokeless tobacco: Never Used     Comment: he has tried to quit many times.  . Alcohol Use: No   Additional social history: none  Please also refer to relevant sections of EMR.  Family History: Family History  Problem Relation Age of Onset  . Heart attack Father   . Diabetes Father   . Breast cancer Mother    Allergies and Medications: No Known Allergies No current facility-administered medications on file prior to encounter.   Current Outpatient Prescriptions on File Prior to Encounter  Medication Sig Dispense Refill  . albuterol (PROVENTIL) (2.5 MG/3ML) 0.083% nebulizer solution Take 3 mLs (2.5 mg total) by nebulization every 6 (six) hours as needed for wheezing.  75 mL  12  . aspirin EC 81 MG tablet Take 81 mg by mouth daily.       Marland Kitchen atorvastatin (LIPITOR) 40 MG tablet Take 1 tablet (40 mg total) by mouth daily.  90 tablet  1  . carvedilol (COREG) 3.125 MG tablet Take 3.125 mg by mouth 2 (two) times daily with a meal.      . clopidogrel (PLAVIX) 75 MG tablet Take 1 tablet (75 mg total) by mouth daily.  90  tablet  0  . furosemide (LASIX) 40 MG tablet Take 40 mg by mouth daily.      . insulin NPH-regular (NOVOLIN 70/30) (70-30) 100 UNIT/ML injection Inject 20-25 Units into the skin daily with breakfast.      . ipratropium (ATROVENT) 0.02 % nebulizer solution Take 2.5 mLs (500 mcg total) by nebulization every 6 (six) hours.  75 mL  12  . Ipratropium-Albuterol (COMBIVENT) 20-100 MCG/ACT AERS respimat Inhale 1 puff into the lungs every 6 (six) hours.  1 Inhaler  12  . nitroGLYCERIN (NITROSTAT) 0.4 MG SL tablet Place 0.4 mg under the tongue every 5 (five) minutes as needed. For chest pain        Objective: BP 134/91  Pulse 97  Temp(Src) 98.1 F (36.7 C) (Oral)  Resp 22  Ht 5\' 10"  (1.778 m)  Wt 144 lb 6.4 oz (65.5 kg)  BMI 20.72 kg/m2  SpO2 94% Exam: General: NAD, resting comfortably in bed HEENT: NCAT, MMM, [place] in place Cardiovascular: rrr, no murmurs appreciated Respiratory: clear bilaterally, diminished air movement, no wheezes or crackles Abdomen: s, NT, ND, no guarding or rebound, no masses palpated, scar to the right of midline Extremities: 3+ pitting edema in bilateral LE to just below the knee, weeping lesions, there is erythema noted bilaterally on the anterior aspect LE to mid shin, hands with apparent muscle wasting  Skin: see extremities for skin Neuro: alert, no focal deficits  Labs and Imaging: CBC BMET   Recent Labs Lab 10/15/13 1503  WBC 6.5  HGB 15.2  HCT 51.7  PLT 103*    Recent Labs Lab 10/15/13 1503  NA 141  K 4.4  CL 95*  CO2 34*  BUN 19  CREATININE 1.07  GLUCOSE 211*  CALCIUM 8.9     Dg Chest 2 View  10/15/2013   CLINICAL DATA:  Shortness of breath  EXAM: CHEST  2 VIEW  COMPARISON:  04/21/2013  FINDINGS: A pacing device is again seen and stable. Postsurgical changes are again noted. The cardiac shadow is stable. The lungs are well aerated bilaterally and mildly hyperinflated. No focal infiltrate or sizable effusion is seen. No acute bony abnormality  is noted.  IMPRESSION: No acute abnormality noted.   Electronically Signed   By: Inez Catalina M.D.   On: 10/15/2013 13:36   EKG: pacemaker artifact, LBBB appears on previous EKG  Leone Haven, MD 10/15/2013, 6:14 PM PGY-2, [place] Intern pager: [phone], text pages welcome

## 2013-10-15 NOTE — ED Notes (Signed)
Lab called for BNP valeria sts received and will run it.

## 2013-10-15 NOTE — Addendum Note (Signed)
Addended by: Lamar Blinks C on: 10/15/2013 12:43 PM   Modules accepted: Level of Service

## 2013-10-15 NOTE — ED Notes (Signed)
Per ems, pt from home, hx of CHF and pacemaker, last three weeks noticed a lot of weeping in lower extremities. States while hes standing it soaks his pants and shoes. Pt here for CHF exacerbation. Denies CP, SOB. When ems arrived, pt denied SOB but 02 was in the 70s, pt on 4L o2, now 100% . Denies pain. AAOX4, skin warm and dry.

## 2013-10-15 NOTE — Progress Notes (Signed)
Family Practice Teaching Service Interval Progress Note  Patients d-dimer returned elevated. Discussed with the patient and will obtain a CTA chest to evaluate for PE.  Tommi Rumps, MD Family Medicine PGY-2 Service Pager [phone]

## 2013-10-15 NOTE — ED Provider Notes (Signed)
CSN: [id number]     Arrival date & time 10/15/13  1305 History   First MD Initiated Contact with Patient 10/15/13 1316     Chief Complaint  Patient presents with  . Congestive Heart Failure     (Consider location/radiation/quality/duration/timing/severity/associated sxs/prior Treatment) HPI Pt is a 68yo male sent to ED from [facility], Dr. Charolette Child due to concern for hypoxemia and bilateral lower leg edema despite taking medications as prescribed. Denies chest pain. Pt reports gradually increasing lower leg edema x1 month associated with "wheeping" clear fluid from both legs for last 2-3 weeks. Pt states nothing helps with swelling and the clear drainage leaves his pants soaked by the end of the day as pt works at [employer] and must stand up to 8 hours daily. Denies chest pain or SOB despite being hypoxic with O2 in 70s per triage note.  Reports having been on O2 in the past but was "cleared" by his cardiologist 30mo ago because he was doing well.  Yesterday, pt called cardiologist to describe leg symptoms and was advised to f/u with PCP as symptoms did not sound cardiac in nature. Pt currently takes Plavic and Lasix 40mg  daily.  Denies recent change in medications. Denies fever, n/v/d. Denies chest pain. Denies hx of blood clots.   Past Medical History  Diagnosis Date  . Diabetes mellitus   . Colon cancer     s/p colectomy   . Hypertension   . CAD (coronary artery disease)     a. CABG in 1996. b. NSTEMI 06/2011: DES x 2 to SVG to 1st and 2nd OM and SVG to PDA.  . Ischemic cardiomyopathy Dec. 2012    a. EF 20 to 25% 2012. b. 30-35% echo 3/13.  . Tobacco abuse   . COPD (chronic obstructive pulmonary disease)   . Chronic systolic CHF (congestive heart failure)   . Chronic respiratory failure     a. On home O2 - due to COPD.   Past Surgical History  Procedure Laterality Date  . Colon surgery    . Open heart surgery    . Coronary stent placement  Dec. 2012    DES to SVG to 1st and  2nd OM and SVG to the PDA  . Coronary artery bypass graft    . Coronary angioplasty    . Colon surgery      FOR HISTORY OF COLON CANCER   Family History  Problem Relation Age of Onset  . Heart attack Father   . Diabetes Father   . Breast cancer Mother    History  Substance Use Topics  . Smoking status: Current Every Day Smoker -- 0.50 packs/day for 50 years    Types: Cigarettes    Start date: 02/26/2012  . Smokeless tobacco: Never Used     Comment: he has tried to quit many times.  . Alcohol Use: No    Review of Systems  Constitutional: Negative for fever, chills and fatigue.  Respiratory: Negative for cough and shortness of breath.   Cardiovascular: Positive for leg swelling ( bilatera, with "wheeping" ). Negative for chest pain and palpitations.  Skin: Positive for color change and wound.       Bilateral lower legs  All other systems reviewed and are negative.     Allergies  Review of patient's allergies indicates no known allergies.  Home Medications   Prior to Admission medications   Medication Sig Start Date End Date Taking? Authorizing Provider  albuterol (PROVENTIL) (2.5 MG/3ML) 0.083%  nebulizer solution Take 3 mLs (2.5 mg total) by nebulization every 6 (six) hours as needed for wheezing. 04/23/13  Yes Donita Brooks, NP  aspirin EC 81 MG tablet Take 81 mg by mouth daily.    Yes Historical Provider, MD  atorvastatin (LIPITOR) 40 MG tablet Take 1 tablet (40 mg total) by mouth daily. 07/05/13  Yes Thayer Headings, MD  carvedilol (COREG) 3.125 MG tablet Take 3.125 mg by mouth 2 (two) times daily with a meal. 10/21/12  Yes Thayer Headings, MD  clopidogrel (PLAVIX) 75 MG tablet Take 1 tablet (75 mg total) by mouth daily. 10/04/13  Yes Thayer Headings, MD  furosemide (LASIX) 40 MG tablet Take 40 mg by mouth daily. 04/13/13  Yes Thayer Headings, MD  insulin NPH-regular (NOVOLIN 70/30) (70-30) 100 UNIT/ML injection Inject 20-25 Units into the skin daily with breakfast.   Yes  Historical Provider, MD  ipratropium (ATROVENT) 0.02 % nebulizer solution Take 2.5 mLs (500 mcg total) by nebulization every 6 (six) hours. 04/23/13  Yes Donita Brooks, NP  Ipratropium-Albuterol (COMBIVENT) 20-100 MCG/ACT AERS respimat Inhale 1 puff into the lungs every 6 (six) hours. 05/10/13  Yes Tanda Rockers, MD  nitroGLYCERIN (NITROSTAT) 0.4 MG SL tablet Place 0.4 mg under the tongue every 5 (five) minutes as needed. For chest pain   Yes Historical Provider, MD   BP 107/59  Pulse 77  Temp(Src) 98 F (36.7 C) (Oral)  Resp 16  SpO2 97% Physical Exam  Nursing note and vitals reviewed. Constitutional: He appears well-developed and well-nourished.  HENT:  Head: Normocephalic and atraumatic.  Eyes: Conjunctivae are normal. No scleral icterus.  Neck: Normal range of motion.  Cardiovascular: Normal rate, regular rhythm and normal heart sounds.   Pulses:      Dorsalis pedis pulses are 2+ on the right side, and 2+ on the left side.  Pulmonary/Chest: Effort normal and breath sounds normal. No respiratory distress. He has no wheezes. He has no rales. He exhibits no tenderness.  Abdominal: Soft. Bowel sounds are normal. He exhibits no distension and no mass. There is no tenderness. There is no rebound and no guarding.  Musculoskeletal: Normal range of motion. He exhibits edema ( 4+ pitting, bilateral to knees). He exhibits no tenderness.  Significant bilateral lower leg edema w/o tenderness.  Neurological: He is alert.  Skin: Skin is warm and dry. There is erythema.  Bilateral pitting edema with erythema of lower legs. Drainage of clear fluid and diffuse superficial open sores due to skin tightness. No warmth or red streaking.     ED Course  Procedures (including critical care time) Labs Review Labs Reviewed  CBC WITH DIFFERENTIAL - Abnormal; Notable for the following:    MCHC 29.4 (*)    RDW 16.9 (*)    Platelets 103 (*)    Neutrophils Relative % 82 (*)    Lymphocytes Relative 10  (*)    Lymphs Abs 0.6 (*)    All other components within normal limits  BASIC METABOLIC PANEL - Abnormal; Notable for the following:    Chloride 95 (*)    CO2 34 (*)    Glucose, Bld 211 (*)    GFR calc non Af Amer 70 (*)    GFR calc Af Amer 81 (*)    All other components within normal limits  PRO B NATRIURETIC PEPTIDE - Abnormal; Notable for the following:    Pro B Natriuretic peptide (BNP) 2626.0 (*)    All other components  within normal limits  TROPONIN I    Imaging Review Dg Chest 2 View  10/15/2013   CLINICAL DATA:  Shortness of breath  EXAM: CHEST  2 VIEW  COMPARISON:  04/21/2013  FINDINGS: A pacing device is again seen and stable. Postsurgical changes are again noted. The cardiac shadow is stable. The lungs are well aerated bilaterally and mildly hyperinflated. No focal infiltrate or sizable effusion is seen. No acute bony abnormality is noted.  IMPRESSION: No acute abnormality noted.   Electronically Signed   By: Inez Catalina M.D.   On: 10/15/2013 13:36     EKG Interpretation   Date/Time:  Friday October 15 2013 13:27:39 EDT Ventricular Rate:  80 PR Interval:  132 QRS Duration: 130 QT Interval:  382 QTC Calculation: 441 R Axis:   -61 Text Interpretation:  Pacemaker spikes or artifacts Sinus rhythm Left  bundle branch block since last tracing no significant change Confirmed by  Eulis Foster  MD, Vira Agar ([id number]) on 10/15/2013 3:32:59 PM      MDM   Final diagnoses:  Hypoxemia  Peripheral edema    Pt is a 68yo male sent to ED from [facility] by Dr. Lorelei Pont due to concern for unexplained hypoxemia and peripheral edema. Pt does have hx of CHF and implatabed defibrillator ([employer]).    CXR at [facility]: no acute abnormality noted.  Pt was 78% O2 on RA, improved to 100% O2 with 4L [place].  Pt states he use to be on O2 at home but was taken off by his cardiologist 34mo ago because he was doing well.  Pt currently taking 40mg  Lasix daily.    EKG: consistent with previous Labs:  CBC- normal Hgb/Hct. PLT-lower, 103 down from 120 42mo ago BMP: elevated glucose-211, Anion gap WNL-12.0 BNP: elevated, 2626 from 29mo was 1054.    Discussed pt with Dr. Eulis Foster who also examined pt.  Will admit pt for hypoxemia with associated peripheral edema and possible underlying cellulitis.  Ancef 1g IV given in ED.  When discussing plan with pt, pt did state his feet were starting to cramp and requested pain medication.  PCP is listed as Dr. Darcus Austin, however pt states he has not seen her in "a long long time"  Will consult Family Practice to admit pt.   4:49 PM Consulted with Dr. Caryl Bis, Family Medicine, who agreed to admit pt who is stable at this time.    Noland Fordyce, PA-C 10/15/13 1650

## 2013-10-15 NOTE — ED Notes (Signed)
Phlebotomy at bedside.

## 2013-10-15 NOTE — ED Provider Notes (Signed)
  Face-to-face evaluation   History: He is a gradual swelling of the legs, bilaterally, now with weakness, and bleeding. He is using his usual medications, without relief  Physical exam: Alert, cooperative. Bilateral lower extremities swelling with scattered areas of excoriation, bleeding, and drainage of clear fluid.  Elevation is consistent with peripheral edema, and hypoxia. Cause of hypoxia, is not clear. He's not in acute decompensated heart failure.  Medical screening examination/treatment/procedure(s) were conducted as a shared visit with non-physician practitioner(s) and myself.  I personally evaluated the patient during the encounter  Richarda Blade, MD 10/16/13 [phone]

## 2013-10-16 DIAGNOSIS — Z95810 Presence of automatic (implantable) cardiac defibrillator: Secondary | ICD-10-CM

## 2013-10-16 DIAGNOSIS — R0902 Hypoxemia: Secondary | ICD-10-CM

## 2013-10-16 DIAGNOSIS — R0609 Other forms of dyspnea: Secondary | ICD-10-CM

## 2013-10-16 DIAGNOSIS — I509 Heart failure, unspecified: Secondary | ICD-10-CM

## 2013-10-16 DIAGNOSIS — J449 Chronic obstructive pulmonary disease, unspecified: Secondary | ICD-10-CM

## 2013-10-16 DIAGNOSIS — R609 Edema, unspecified: Secondary | ICD-10-CM

## 2013-10-16 DIAGNOSIS — I2510 Atherosclerotic heart disease of native coronary artery without angina pectoris: Secondary | ICD-10-CM

## 2013-10-16 DIAGNOSIS — R0989 Other specified symptoms and signs involving the circulatory and respiratory systems: Secondary | ICD-10-CM

## 2013-10-16 LAB — COMPREHENSIVE METABOLIC PANEL
ALBUMIN: 3.3 g/dL — AB (ref 3.5–5.2)
ALT: 12 U/L (ref 0–53)
AST: 17 U/L (ref 0–37)
Alkaline Phosphatase: 65 U/L (ref 39–117)
BUN: 17 mg/dL (ref 6–23)
CALCIUM: 9.3 mg/dL (ref 8.4–10.5)
CO2: 36 mEq/L — ABNORMAL HIGH (ref 19–32)
Chloride: 95 mEq/L — ABNORMAL LOW (ref 96–112)
Creatinine, Ser: 1.06 mg/dL (ref 0.50–1.35)
GFR calc non Af Amer: 71 mL/min — ABNORMAL LOW (ref >90)
GFR, EST AFRICAN AMERICAN: 82 mL/min — AB (ref >90)
GLUCOSE: 182 mg/dL — AB (ref 70–99)
Potassium: 4.6 mEq/L (ref 3.7–5.3)
Sodium: 143 mEq/L (ref 137–147)
TOTAL PROTEIN: 6.8 g/dL (ref 6.0–8.3)
Total Bilirubin: 0.9 mg/dL (ref 0.3–1.2)

## 2013-10-16 LAB — HEMOGLOBIN A1C
Hgb A1c MFr Bld: 10 % — ABNORMAL HIGH (ref <5.7)
MEAN PLASMA GLUCOSE: 240 mg/dL — AB (ref <117)

## 2013-10-16 LAB — GLUCOSE, CAPILLARY
GLUCOSE-CAPILLARY: 172 mg/dL — AB (ref 70–99)
GLUCOSE-CAPILLARY: 324 mg/dL — AB (ref 70–99)
GLUCOSE-CAPILLARY: 332 mg/dL — AB (ref 70–99)
Glucose-Capillary: 201 mg/dL — ABNORMAL HIGH (ref 70–99)
Glucose-Capillary: 247 mg/dL — ABNORMAL HIGH (ref 70–99)

## 2013-10-16 LAB — CBC
HCT: 54.4 % — ABNORMAL HIGH (ref 39.0–52.0)
Hemoglobin: 16 g/dL (ref 13.0–17.0)
MCH: 26.5 pg (ref 26.0–34.0)
MCHC: 29.4 g/dL — AB (ref 30.0–36.0)
MCV: 90.2 fL (ref 78.0–100.0)
Platelets: 94 10*3/uL — ABNORMAL LOW (ref 150–400)
RBC: 6.03 MIL/uL — ABNORMAL HIGH (ref 4.22–5.81)
RDW: 17 % — ABNORMAL HIGH (ref 11.5–15.5)
WBC: 5.3 10*3/uL (ref 4.0–10.5)

## 2013-10-16 LAB — TROPONIN I: Troponin I: <0.3 ng/mL (ref <0.30)

## 2013-10-16 MED ORDER — FUROSEMIDE 10 MG/ML IJ SOLN
40.0000 mg | Freq: Once | INTRAMUSCULAR | Status: AC
  Administered 2013-10-16: 40 mg via INTRAVENOUS
  Filled 2013-10-16: qty 4

## 2013-10-16 NOTE — Progress Notes (Signed)
Family Medicine Teaching Service Daily Progress Note Intern Pager: [phone]  Patient name: James Moon Medical record number: [id number] Date of birth: Dec 17, 1945 Age: 68 y.o. Gender: male  Primary Care Provider: Marjorie Smolder, MD Consultants: none Code Status: full  Pt Overview and Major Events to Date:  4/17: AECHF; IV lasix  Assessment and Plan: ZEDRIC DEROY is a 68 y.o. male presenting with LE edema and shortness of breath . PMH is significant for HFrEF, COPD, CAD, DM.   # AECHF (HFrEF): LE edema and shortness of breath and hypoxemia: on going for the past 1-2 months with a history of reduced EF and associated orthopnea and elevated BNP makes CHF exacerbation the most likely cause. Must also consider COPD exacerbation (though no wheezes). CT angio negative for PE -strict I/O's and daily weights  - troponins negative x 3   - wean O2 as able to keep stats 88-92%; Current on 4 L may need home O2 - albuterol prn  - ECHO today; Last Echo 3/13 showed EF 19-37%, systolic dyxfcn severe hypokinesis  - continue home coreg, consider optimization of HF medications pending echo. Not currently on ACE-I or spironolactone but doubt his BP would tolerate this  - Apply compression socks and keep feet elevated when in bed - Lasix IV 40 once  - May need to consult HF team for diuresis if BP not tolerating Lasix   # LE wounds: Concern for cellulitis in LE by EDPA, though patient with bilateral skin changes consistent with the amount of volume in his LE - will hold further antibiotics at this time as the patient has no fever and no elevated WBC - Consistent with venous statis ulcers will continue to monitor for signs of infections  # Hx of CAD: CABG in 1996. b. NSTEMI 06/2011: DES x 2 to SVG to 1st and 2nd OM and SVG to PDA. AICD - Nonsustained Vtach (16 beats 4/18)-  no chest pain at this time; Trops neg as above - will continue home plavix and ASA  - continue home lipitor  - keep on tele    # DM: last A1c 8.5 2012  - A1c pending  -SSI and qac/qhs cbgs   FEN/GI: heart healthy/carb mod diet, kvo  Prophylaxis: heparin SQ; Plts 103 --> 94, continue to monitor  Disposition: On tele, discharge pending improvement in O2 status  Subjective:  Reports improved breathing today and decreased LE edema. Denies CP or palpitations.   Objective: Temp:  [98 F (36.7 C)-98.4 F (36.9 C)] 98.2 F (36.8 C) (04/18 0615) Pulse Rate:  [71-105] 82 (04/18 0615) Resp:  [15-22] 20 (04/18 0615) BP: (102-134)/(54-91) 108/78 mmHg (04/18 0615) SpO2:  [78 %-100 %] 95 % (04/18 0615) Weight:  [142 lb 8 oz (64.638 kg)-150 lb (68.04 kg)] 142 lb 8 oz (64.638 kg) (04/18 0615) Physical Exam: General: NAD, resting comfortably in bed HEENT: NCAT, MMM, [place] in place  Cardiovascular: rrr, no murmurs appreciated  Respiratory: clear bilaterally, diminished air movement, no wheezes or crackles  Extremities: 2+ pitting edema in bilateral LE to just below mid shin, weeping lesions, there is erythema noted bilaterally on the anterior aspect LE to mid shin, hands with apparent muscle wasting  Neuro: alert, no focal deficits  Laboratory:  Recent Labs Lab 10/15/13 1503 10/16/13 0706  WBC 6.5 5.3  HGB 15.2 16.0  HCT 51.7 54.4*  PLT 103* 94*    Recent Labs Lab 10/15/13 1503 10/16/13 0706  NA 141 143  K 4.4 4.6  CL 95* 95*  CO2 34* 36*  BUN 19 17  CREATININE 1.07 1.06  CALCIUM 8.9 9.3  PROT  --  6.8  BILITOT  --  0.9  ALKPHOS  --  65  ALT  --  12  AST  --  17  GLUCOSE 211* 182*   Imaging/Diagnostic Tests: 4/17 EKG: pacemaker artifact, LBBB appears on previous EKG 4/17 CXR: No acute abnormality noted. 4/17 CT angio chest: No evidence of significant pulmonary embolus. Small bilateral pleural effusions with basilar atelectasis. Suggestion of early inflammatory/infectious process in the right base. Mediastinal and  hilar lymph nodes are upper limits of normal, likely reactive.  Phill Myron,  MD 10/16/2013, 11:22 AM PGY-1, [place] Intern pager: [phone], text pages welcome

## 2013-10-16 NOTE — Progress Notes (Signed)
MD is aware of 6 beat run of V Tach.  Client is asymptomatic.  Denies shortness of breath. Respiratory exchange even and unlabored.  Vital signs stable.

## 2013-10-16 NOTE — H&P (Signed)
FMTS Attending Daily Note:  James Sabal MD  [phone] pager  Family Practice pager:  [phone] I have seen and examined this patient and have reviewed their chart. I have discussed this patient with the resident. I agree with the resident's findings, assessment and care plan.  Additionally:  68 yo M admitted w/ increasing shortness of breath and LE edema for 2-3 months.  Describes weeping wounds BL legs.  W/U thus far has revealed negative CTA for PE but questionable inflammatory/infectious process Right lung base.  Not on home O2 but currently with O2 requirement.  Feels better this AM.  Relieved he does not have clot.    Exam: Elderly male lying in bed.  RRR.  Lungs with diminished sounds and crackles BL.  LE's with venous stasis changes BL.  Some mild weeping currently.  Edema +2 ankles to mid-shins.  Alert and oriented.  Imp/Plan: 1. Dyspnea: - negative for PE.  Favor HF exacerbation as cause of dyspnea and LE edema - agree with lasix and Echo to assess for heart failure - denies any signs/sx's of PNA whatsoever.  Hold off on treatment of radiographic findings.   Alveda Reasons, MD 10/16/2013 10:43 AM

## 2013-10-16 NOTE — Progress Notes (Signed)
UR Completed Mckaylee Dimalanta Graves-Bigelow, RN,BSN [phone]  

## 2013-10-16 NOTE — Progress Notes (Signed)
FMTS Attending Daily Note:  Annabell Sabal MD  [phone] pager  Family Practice pager:  [phone] I have seen and examined this patient and have reviewed their chart. I have discussed this patient with the resident. I agree with the resident's findings, assessment and care plan.  Additionally:  See separate admit note for details.   Alveda Reasons, MD 10/16/2013 1:11 PM

## 2013-10-17 DIAGNOSIS — I5023 Acute on chronic systolic (congestive) heart failure: Principal | ICD-10-CM

## 2013-10-17 DIAGNOSIS — I369 Nonrheumatic tricuspid valve disorder, unspecified: Secondary | ICD-10-CM

## 2013-10-17 LAB — GLUCOSE, CAPILLARY
Glucose-Capillary: 258 mg/dL — ABNORMAL HIGH (ref 70–99)
Glucose-Capillary: 257 mg/dL — ABNORMAL HIGH (ref 70–99)

## 2013-10-17 LAB — BASIC METABOLIC PANEL
BUN: 24 mg/dL — ABNORMAL HIGH (ref 6–23)
CALCIUM: 9.1 mg/dL (ref 8.4–10.5)
CO2: 36 mEq/L — ABNORMAL HIGH (ref 19–32)
CREATININE: 1.06 mg/dL (ref 0.50–1.35)
Chloride: 91 mEq/L — ABNORMAL LOW (ref 96–112)
GFR calc non Af Amer: 71 mL/min — ABNORMAL LOW (ref >90)
GFR, EST AFRICAN AMERICAN: 82 mL/min — AB (ref >90)
Glucose, Bld: 201 mg/dL — ABNORMAL HIGH (ref 70–99)
Potassium: 4.4 mEq/L (ref 3.7–5.3)
SODIUM: 139 meq/L (ref 137–147)

## 2013-10-17 LAB — CBC
HCT: 47.9 % (ref 39.0–52.0)
Hemoglobin: 14 g/dL (ref 13.0–17.0)
MCH: 26.4 pg (ref 26.0–34.0)
MCHC: 29.2 g/dL — ABNORMAL LOW (ref 30.0–36.0)
MCV: 90.2 fL (ref 78.0–100.0)
PLATELETS: 98 10*3/uL — AB (ref 150–400)
RBC: 5.31 MIL/uL (ref 4.22–5.81)
RDW: 16.9 % — AB (ref 11.5–15.5)
WBC: 5.5 10*3/uL (ref 4.0–10.5)

## 2013-10-17 MED ORDER — FUROSEMIDE 40 MG PO TABS
40.0000 mg | ORAL_TABLET | Freq: Every day | ORAL | Status: DC
  Administered 2013-10-17: 40 mg via ORAL
  Filled 2013-10-17: qty 1

## 2013-10-17 MED ORDER — FUROSEMIDE 40 MG PO TABS: 80.0000 mg | ORAL_TABLET | Freq: Every day | ORAL | Status: DC

## 2013-10-17 NOTE — Progress Notes (Signed)
FMTS Attending Daily Note:  Annabell Sabal MD  [phone] pager  Family Practice pager:  [phone] I have seen and examined this patient and have reviewed their chart. I have discussed this patient with the resident. I agree with the resident's findings, assessment and care plan.  Additionally:  - Overall vastly improved.   - Lungs sound better, legs much improved, though still with venous changes. - Echo not done yesterday.  EKG showed new LBBB (prior had shown hemiblock and incomplete LBBB).  Negative troponins.   - Will touch base with cards today.  - continue to wean O2 downwards.  Has been off for about an hour thus far.    Alveda Reasons, MD 10/17/2013 12:08 PM

## 2013-10-17 NOTE — Progress Notes (Signed)
Utilization review completed.  

## 2013-10-17 NOTE — Discharge Instructions (Signed)
You were admitted for a heart failure exacerbation.  Please make an appointment with your cardiologist in the next week. The only medicine we changed, was your lasix.  Take 2 pills (80mg ) once a day for the next week. Wear the Compression Socks whenever you are on your feet.  Please follow up with the doctors as [place] to work on your diabetes.  If you get short of breath or develop chest pain, please get evaluated by a doctor right away.

## 2013-10-17 NOTE — Progress Notes (Signed)
Provided information resources on hearing aids.  Information provided by Education officer, museum.

## 2013-10-17 NOTE — Progress Notes (Signed)
Family Medicine Teaching Service Daily Progress Note Intern Pager: [phone]  Patient name: James Moon Medical record number: [id number] Date of birth: 1946-03-18 Age: 68 y.o. Gender: male  Primary Care Provider: Marjorie Smolder, MD Consultants: none Code Status: full  Pt Overview and Major Events to Date:  4/17: AECHF; IV lasix  Assessment and Plan: James Moon is a 68 y.o. male presenting with LE edema and shortness of breath . PMH is significant for HFrEF, COPD, CAD, DM.   # AECHF (HFrEF): Echo 08/2011 with EF 30-35% and severe hypokinesis. Less likely COPD exacerbation.  CT angio negative for PE. - strict I/O's and daily weights  - troponins negative x 3   - wean O2 as able to keep stats 88-92%; if unable to wean will need to set up with home oxygen - albuterol prn  - ECHO ordered - continue home coreg - not currently on ACE-I or spironolactone but doubt his BP would tolerate this  - Apply compression socks and keep feet elevated when in bed - will hold of on additional IV lasix.  Restart home dose of 40mg  PO daily.   # LE wounds: Concern for cellulitis in LE by EDPA, though patient with bilateral skin changes consistent with the amount of volume in his LE - will hold further antibiotics at this time as the patient has no fever and no elevated WBC - Consistent with venous statis ulcers will continue to monitor for signs of infections  # Hx of CAD: CABG in 1996. b. NSTEMI 06/2011: DES x 2 to SVG to 1st and 2nd OM and SVG to PDA. AICD - Nonsustained Vtach (16 beats 4/18)-  no chest pain at this time; Trops neg as above - will continue home plavix and ASA  - continue home lipitor  - keep on tele   # DM: last A1c 8.5 2012  - A1c 10.0% - CBGS in 200s mostly - SSI and qac/qhs cbgs   FEN/GI: heart healthy/carb mod diet, kvo  Prophylaxis: heparin SQ; Plts 103 --> 94, continue to monitor --> stable  Disposition: On tele, discharge pending improvement in O2 status;  anticipate today or tomorrow depending on oxygen status and echo.  Subjective:  Reports improved breathing today and decreased LE edema. Denies CP or palpitations. Reports good appetite.  Would like to go home.  Willing to do echo as outpatient if needed.  Objective: Temp:  [97.9 F (36.6 C)-98.4 F (36.9 C)] 97.9 F (36.6 C) (04/19 0532) Pulse Rate:  [69-93] 92 (04/19 0532) Resp:  [18-20] 18 (04/19 0532) BP: (93-115)/(50-85) 115/66 mmHg (04/19 0532) SpO2:  [94 %-98 %] 96 % (04/19 0532) Weight:  [141 lb 12.8 oz (64.32 kg)] 141 lb 12.8 oz (64.32 kg) (04/19 0534) Physical Exam: General: NAD, resting comfortably in bed HEENT: NCAT, MMM, [place] in place  Cardiovascular: rrr, no murmurs appreciated  Respiratory: clear bilaterally, diffusely diminished air movement, no wheezes or crackles  Extremities: ted hose in place; trace edema in ankles Neuro: alert, no focal deficits  Laboratory:  Recent Labs Lab 10/15/13 1503 10/16/13 0706 10/17/13 0355  WBC 6.5 5.3 5.5  HGB 15.2 16.0 14.0  HCT 51.7 54.4* 47.9  PLT 103* 94* 98*    Recent Labs Lab 10/15/13 1503 10/16/13 0706 10/17/13 0355  NA 141 143 139  K 4.4 4.6 4.4  CL 95* 95* 91*  CO2 34* 36* 36*  BUN 19 17 24*  CREATININE 1.07 1.06 1.06  CALCIUM 8.9 9.3 9.1  PROT  --  6.8  --   BILITOT  --  0.9  --   ALKPHOS  --  65  --   ALT  --  12  --   AST  --  17  --   GLUCOSE 211* 182* 201*   Imaging/Diagnostic Tests: 4/17 EKG: pacemaker artifact, LBBB appears on previous EKG 4/17 CXR: No acute abnormality noted. 4/17 CT angio chest: No evidence of significant pulmonary embolus. Small bilateral pleural effusions with basilar atelectasis. Suggestion of early inflammatory/infectious process in the right base. Mediastinal and  hilar lymph nodes are upper limits of normal, likely reactive.  Melony Overly, MD 10/17/2013, 9:17 AM PGY-3, [place] Intern pager: [phone], text pages welcome

## 2013-10-17 NOTE — Progress Notes (Signed)
Verbalized understanding of discharge instructions.  Note for work given, written by Maryruth Eve, MD

## 2013-10-17 NOTE — Progress Notes (Signed)
Echo Lab  2D Echocardiogram completed.  [place], RDCS 10/17/2013 12:12 PM

## 2013-10-17 NOTE — Plan of Care (Signed)
Problem: Consults Goal: Heart Failure Patient Education (See Patient Education module for education specifics.)  Outcome: Progressing Heart Failure education provided

## 2013-10-18 ENCOUNTER — Encounter (HOSPITAL_COMMUNITY): Payer: Self-pay | Admitting: *Deleted

## 2013-10-18 ENCOUNTER — Inpatient Hospital Stay (HOSPITAL_COMMUNITY)
Admission: AD | Admit: 2013-10-18 | Discharge: 2013-10-22 | DRG: 246 | Disposition: A | Discharge status: Home Health | Payer: Medicare Other | Source: Ambulatory Visit | Attending: Family Medicine | Admitting: Family Medicine

## 2013-10-18 ENCOUNTER — Ambulatory Visit (INDEPENDENT_AMBULATORY_CARE_PROVIDER_SITE_OTHER): Payer: Medicare Other | Admitting: Family Medicine

## 2013-10-18 ENCOUNTER — Inpatient Hospital Stay (HOSPITAL_COMMUNITY): Payer: Medicare Other

## 2013-10-18 VITALS — BP 90/55 | HR 94 | Temp 98.2°F | Resp 18 | Ht 70.0 in | Wt 146.0 lb

## 2013-10-18 DIAGNOSIS — I214 Non-ST elevation (NSTEMI) myocardial infarction: Principal | ICD-10-CM | POA: Diagnosis present

## 2013-10-18 DIAGNOSIS — R0902 Hypoxemia: Secondary | ICD-10-CM

## 2013-10-18 DIAGNOSIS — I959 Hypotension, unspecified: Secondary | ICD-10-CM | POA: Diagnosis present

## 2013-10-18 DIAGNOSIS — F17200 Nicotine dependence, unspecified, uncomplicated: Secondary | ICD-10-CM | POA: Diagnosis present

## 2013-10-18 DIAGNOSIS — I472 Ventricular tachycardia, unspecified: Secondary | ICD-10-CM | POA: Diagnosis not present

## 2013-10-18 DIAGNOSIS — J9620 Acute and chronic respiratory failure, unspecified whether with hypoxia or hypercapnia: Secondary | ICD-10-CM | POA: Diagnosis present

## 2013-10-18 DIAGNOSIS — I5042 Chronic combined systolic (congestive) and diastolic (congestive) heart failure: Secondary | ICD-10-CM

## 2013-10-18 DIAGNOSIS — J438 Other emphysema: Secondary | ICD-10-CM | POA: Diagnosis present

## 2013-10-18 DIAGNOSIS — T82897A Other specified complication of cardiac prosthetic devices, implants and grafts, initial encounter: Secondary | ICD-10-CM | POA: Diagnosis present

## 2013-10-18 DIAGNOSIS — I509 Heart failure, unspecified: Secondary | ICD-10-CM

## 2013-10-18 DIAGNOSIS — Z7982 Long term (current) use of aspirin: Secondary | ICD-10-CM

## 2013-10-18 DIAGNOSIS — I4729 Other ventricular tachycardia: Secondary | ICD-10-CM | POA: Diagnosis not present

## 2013-10-18 DIAGNOSIS — Z8249 Family history of ischemic heart disease and other diseases of the circulatory system: Secondary | ICD-10-CM

## 2013-10-18 DIAGNOSIS — I10 Essential (primary) hypertension: Secondary | ICD-10-CM | POA: Diagnosis present

## 2013-10-18 DIAGNOSIS — Z85038 Personal history of other malignant neoplasm of large intestine: Secondary | ICD-10-CM

## 2013-10-18 DIAGNOSIS — I252 Old myocardial infarction: Secondary | ICD-10-CM

## 2013-10-18 DIAGNOSIS — Z602 Problems related to living alone: Secondary | ICD-10-CM

## 2013-10-18 DIAGNOSIS — I2589 Other forms of chronic ischemic heart disease: Secondary | ICD-10-CM | POA: Diagnosis present

## 2013-10-18 DIAGNOSIS — I447 Left bundle-branch block, unspecified: Secondary | ICD-10-CM

## 2013-10-18 DIAGNOSIS — I5022 Chronic systolic (congestive) heart failure: Secondary | ICD-10-CM | POA: Diagnosis present

## 2013-10-18 DIAGNOSIS — E119 Type 2 diabetes mellitus without complications: Secondary | ICD-10-CM | POA: Diagnosis present

## 2013-10-18 DIAGNOSIS — Z833 Family history of diabetes mellitus: Secondary | ICD-10-CM

## 2013-10-18 DIAGNOSIS — K559 Vascular disorder of intestine, unspecified: Secondary | ICD-10-CM | POA: Diagnosis present

## 2013-10-18 DIAGNOSIS — I25810 Atherosclerosis of coronary artery bypass graft(s) without angina pectoris: Secondary | ICD-10-CM | POA: Diagnosis present

## 2013-10-18 DIAGNOSIS — Z9049 Acquired absence of other specified parts of digestive tract: Secondary | ICD-10-CM

## 2013-10-18 DIAGNOSIS — Z95810 Presence of automatic (implantable) cardiac defibrillator: Secondary | ICD-10-CM

## 2013-10-18 DIAGNOSIS — I498 Other specified cardiac arrhythmias: Secondary | ICD-10-CM | POA: Diagnosis present

## 2013-10-18 DIAGNOSIS — Z794 Long term (current) use of insulin: Secondary | ICD-10-CM

## 2013-10-18 DIAGNOSIS — J9610 Chronic respiratory failure, unspecified whether with hypoxia or hypercapnia: Secondary | ICD-10-CM

## 2013-10-18 DIAGNOSIS — I5023 Acute on chronic systolic (congestive) heart failure: Secondary | ICD-10-CM

## 2013-10-18 DIAGNOSIS — Z803 Family history of malignant neoplasm of breast: Secondary | ICD-10-CM

## 2013-10-18 DIAGNOSIS — I2510 Atherosclerotic heart disease of native coronary artery without angina pectoris: Secondary | ICD-10-CM | POA: Diagnosis present

## 2013-10-18 DIAGNOSIS — I2582 Chronic total occlusion of coronary artery: Secondary | ICD-10-CM | POA: Diagnosis present

## 2013-10-18 DIAGNOSIS — E875 Hyperkalemia: Secondary | ICD-10-CM | POA: Diagnosis not present

## 2013-10-18 DIAGNOSIS — R609 Edema, unspecified: Secondary | ICD-10-CM

## 2013-10-18 DIAGNOSIS — Z79899 Other long term (current) drug therapy: Secondary | ICD-10-CM

## 2013-10-18 DIAGNOSIS — I255 Ischemic cardiomyopathy: Secondary | ICD-10-CM

## 2013-10-18 DIAGNOSIS — Z9981 Dependence on supplemental oxygen: Secondary | ICD-10-CM

## 2013-10-18 DIAGNOSIS — R0600 Dyspnea, unspecified: Secondary | ICD-10-CM

## 2013-10-18 DIAGNOSIS — J449 Chronic obstructive pulmonary disease, unspecified: Secondary | ICD-10-CM | POA: Diagnosis present

## 2013-10-18 DIAGNOSIS — I70219 Atherosclerosis of native arteries of extremities with intermittent claudication, unspecified extremity: Secondary | ICD-10-CM | POA: Diagnosis present

## 2013-10-18 DIAGNOSIS — Z9861 Coronary angioplasty status: Secondary | ICD-10-CM

## 2013-10-18 DIAGNOSIS — IMO0001 Reserved for inherently not codable concepts without codable children: Secondary | ICD-10-CM

## 2013-10-18 DIAGNOSIS — H9190 Unspecified hearing loss, unspecified ear: Secondary | ICD-10-CM | POA: Diagnosis present

## 2013-10-18 DIAGNOSIS — Z7902 Long term (current) use of antithrombotics/antiplatelets: Secondary | ICD-10-CM

## 2013-10-18 DIAGNOSIS — I2789 Other specified pulmonary heart diseases: Secondary | ICD-10-CM | POA: Diagnosis present

## 2013-10-18 DIAGNOSIS — Y831 Surgical operation with implant of artificial internal device as the cause of abnormal reaction of the patient, or of later complication, without mention of misadventure at the time of the procedure: Secondary | ICD-10-CM | POA: Diagnosis present

## 2013-10-18 DIAGNOSIS — D696 Thrombocytopenia, unspecified: Secondary | ICD-10-CM | POA: Diagnosis present

## 2013-10-18 HISTORY — DX: Thrombocytopenia, unspecified: D69.6

## 2013-10-18 HISTORY — DX: Syncope and collapse: R55

## 2013-10-18 HISTORY — DX: Presence of automatic (implantable) cardiac defibrillator: Z95.810

## 2013-10-18 HISTORY — DX: Other ventricular tachycardia: I47.29

## 2013-10-18 HISTORY — DX: Ventricular tachycardia: I47.2

## 2013-10-18 HISTORY — DX: Pulmonary hypertension, unspecified: I27.20

## 2013-10-18 HISTORY — DX: Hypotension, unspecified: I95.9

## 2013-10-18 LAB — CBC
HCT: 48.2 % (ref 39.0–52.0)
Hemoglobin: 14.4 g/dL (ref 13.0–17.0)
MCH: 26.5 pg (ref 26.0–34.0)
MCHC: 29.9 g/dL — AB (ref 30.0–36.0)
MCV: 88.6 fL (ref 78.0–100.0)
PLATELETS: 98 10*3/uL — AB (ref 150–400)
RBC: 5.44 MIL/uL (ref 4.22–5.81)
RDW: 17.1 % — AB (ref 11.5–15.5)
WBC: 5.4 10*3/uL (ref 4.0–10.5)

## 2013-10-18 LAB — BASIC METABOLIC PANEL
BUN: 40 mg/dL — ABNORMAL HIGH (ref 6–23)
CALCIUM: 9.2 mg/dL (ref 8.4–10.5)
CO2: 37 mEq/L — ABNORMAL HIGH (ref 19–32)
CREATININE: 1.17 mg/dL (ref 0.50–1.35)
Chloride: 90 mEq/L — ABNORMAL LOW (ref 96–112)
GFR, EST AFRICAN AMERICAN: 73 mL/min — AB (ref >90)
GFR, EST NON AFRICAN AMERICAN: 63 mL/min — AB (ref >90)
Glucose, Bld: 386 mg/dL — ABNORMAL HIGH (ref 70–99)
Potassium: 4.9 mEq/L (ref 3.7–5.3)
SODIUM: 138 meq/L (ref 137–147)

## 2013-10-18 LAB — TROPONIN I: Troponin I: 2.31 ng/mL

## 2013-10-18 MED ORDER — INSULIN ASPART PROT & ASPART (70-30 MIX) 100 UNIT/ML ~~LOC~~ SUSP
20.0000 [IU] | Freq: Every day | SUBCUTANEOUS | Status: DC
  Administered 2013-10-19: 20 [IU] via SUBCUTANEOUS
  Filled 2013-10-18: qty 10

## 2013-10-18 MED ORDER — ACETAMINOPHEN 650 MG RE SUPP: 650.0000 mg | Freq: Four times a day (QID) | RECTAL | Status: DC | PRN

## 2013-10-18 MED ORDER — ASPIRIN 325 MG PO TABS
325.0000 mg | ORAL_TABLET | Freq: Once | ORAL | Status: AC
  Administered 2013-10-18: 325 mg via ORAL
  Filled 2013-10-18: qty 1

## 2013-10-18 MED ORDER — ACETAMINOPHEN 325 MG PO TABS
650.0000 mg | ORAL_TABLET | Freq: Four times a day (QID) | ORAL | Status: DC | PRN
Start: 2013-10-18 — End: 2013-10-22

## 2013-10-18 MED ORDER — INSULIN ASPART PROT & ASPART (70-30 MIX) 100 UNIT/ML ~~LOC~~ SUSP: 20.0000 [IU] | Freq: Every day | SUBCUTANEOUS | Status: DC

## 2013-10-18 MED ORDER — ASPIRIN EC 81 MG PO TBEC
81.0000 mg | DELAYED_RELEASE_TABLET | Freq: Every day | ORAL | Status: DC
  Administered 2013-10-19: 81 mg via ORAL
  Filled 2013-10-18 (×2): qty 1

## 2013-10-18 MED ORDER — ATORVASTATIN CALCIUM 40 MG PO TABS
40.0000 mg | ORAL_TABLET | Freq: Every day | ORAL | Status: DC
  Administered 2013-10-19 – 2013-10-20 (×2): 40 mg via ORAL
  Filled 2013-10-18 (×5): qty 1

## 2013-10-18 MED ORDER — IPRATROPIUM-ALBUTEROL 0.5-2.5 (3) MG/3ML IN SOLN
3.0000 mL | Freq: Four times a day (QID) | RESPIRATORY_TRACT | Status: DC
  Administered 2013-10-18 – 2013-10-20 (×6): 3 mL via RESPIRATORY_TRACT
  Filled 2013-10-18 (×7): qty 3

## 2013-10-18 MED ORDER — CARVEDILOL 3.125 MG PO TABS
3.1250 mg | ORAL_TABLET | Freq: Two times a day (BID) | ORAL | Status: DC
  Filled 2013-10-18 (×2): qty 1

## 2013-10-18 MED ORDER — CLOPIDOGREL BISULFATE 75 MG PO TABS
75.0000 mg | ORAL_TABLET | Freq: Every day | ORAL | Status: DC
  Administered 2013-10-19 – 2013-10-22 (×4): 75 mg via ORAL
  Filled 2013-10-18 (×4): qty 1

## 2013-10-18 MED ORDER — FUROSEMIDE 80 MG PO TABS
80.0000 mg | ORAL_TABLET | Freq: Every day | ORAL | Status: DC
  Administered 2013-10-18: 80 mg via ORAL
  Filled 2013-10-18 (×2): qty 1

## 2013-10-18 MED ORDER — FUROSEMIDE 40 MG PO TABS
40.0000 mg | ORAL_TABLET | Freq: Every day | ORAL | Status: DC
  Filled 2013-10-18: qty 1

## 2013-10-18 MED ORDER — SODIUM CHLORIDE 0.9 % IJ SOLN
3.0000 mL | Freq: Two times a day (BID) | INTRAMUSCULAR | Status: DC
  Administered 2013-10-18 – 2013-10-21 (×4): 3 mL via INTRAVENOUS

## 2013-10-18 MED ORDER — INSULIN ASPART 100 UNIT/ML ~~LOC~~ SOLN: 0.0000 [IU] | Freq: Three times a day (TID) | SUBCUTANEOUS | Status: DC

## 2013-10-18 MED ORDER — INSULIN ASPART 100 UNIT/ML ~~LOC~~ SOLN
0.0000 [IU] | Freq: Three times a day (TID) | SUBCUTANEOUS | Status: DC
  Administered 2013-10-19 (×2): 5 [IU] via SUBCUTANEOUS

## 2013-10-18 MED ORDER — HEPARIN SODIUM (PORCINE) 5000 UNIT/ML IJ SOLN
5000.0000 [IU] | Freq: Three times a day (TID) | INTRAMUSCULAR | Status: DC
  Administered 2013-10-18 – 2013-10-19 (×2): 5000 [IU] via SUBCUTANEOUS
  Filled 2013-10-18 (×5): qty 1

## 2013-10-18 MED ORDER — INSULIN ASPART 100 UNIT/ML ~~LOC~~ SOLN
0.0000 [IU] | Freq: Every day | SUBCUTANEOUS | Status: DC
  Administered 2013-10-18: 5 [IU] via SUBCUTANEOUS

## 2013-10-18 NOTE — Progress Notes (Signed)
[facility] [address] [place] [address] [phone]  Date:  10/18/2013   Name:  James Moon   DOB:  05-19-1946   MRN:  [id number]  PCP:  James Smolder, MD    Chief Complaint: Follow-up   History of Present Illness:  James Moon is a 68 y.o. very pleasant male patient who presents with the following:  He was seen here on 4/17 and admitted to he hospital with heart failure, hypoxemia.  He was diuresed and released to home yesterday.  He is here today for a recheck, but noted to have O2 in the low 80s.   He feels "a little weak."  He feels that his breathing is good He was not sent home with 02.  It seems that his oxygen was in the 90s without O2 when he was discharged.  I do not see where he saw cardiology while inpt.  Wt Readings from Last 3 Encounters:  10/18/13 146 lb (66.225 kg)  10/17/13 141 lb 12.8 oz (64.32 kg)  10/15/13 150 lb (68.04 kg)     Patient Active Problem List   Diagnosis Date Noted  . Hypoxemia 10/15/2013  . CHF exacerbation 10/15/2013  . Smoker 05/12/2013  . Acute on chronic systolic CHF (congestive heart failure) 04/22/2013  . Chronic respiratory failure 04/22/2013  . Edema, peripheral 12/09/2012  . 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 Dyspnea 06/26/2011  . COPD GOLD III 06/13/2011  . CAD (coronary artery disease) 06/07/2011  . Chronic systolic heart failure 12/25/9483  . IDDM (insulin dependent diabetes mellitus) 06/07/2011  . Ischemic cardiomyopathy 06/01/2011    Past Medical History  Diagnosis Date  . Diabetes mellitus   . Colon cancer     s/p colectomy   . Hypertension   . CAD (coronary artery disease)     a. CABG in 1996. b. NSTEMI 06/2011: DES x 2 to SVG to 1st and 2nd OM and SVG to PDA.  . Ischemic cardiomyopathy Dec. 2012    a. EF 20 to 25% 2012. b. 30-35% echo 3/13.  . Tobacco abuse   . COPD (chronic obstructive pulmonary disease)   . Chronic systolic CHF (congestive heart  failure)   . Chronic respiratory failure     a. On home O2 - due to COPD.    Past Surgical History  Procedure Laterality Date  . Colon surgery    . Open heart surgery    . Coronary stent placement  Dec. 2012    DES to SVG to 1st and 2nd OM and SVG to the PDA  . Coronary artery bypass graft    . Coronary angioplasty    . Colon surgery      FOR HISTORY OF COLON CANCER    History  Substance Use Topics  . Smoking status: Current Every Day Smoker -- 0.50 packs/day for 50 years    Types: Cigarettes    Start date: 02/26/2012  . Smokeless tobacco: Never Used     Comment: he has tried to quit many times.  . Alcohol Use: No    Family History  Problem Relation Age of Onset  . Heart attack Father   . Diabetes Father   . Breast cancer Mother     No Known Allergies  Medication list has been reviewed and updated.  Current Outpatient Prescriptions on File Prior to Visit  Medication Sig Dispense Refill  . aspirin EC 81 MG tablet Take 81 mg by mouth daily.       Marland Kitchen  atorvastatin (LIPITOR) 40 MG tablet Take 1 tablet (40 mg total) by mouth daily.  90 tablet  1  . carvedilol (COREG) 3.125 MG tablet Take 3.125 mg by mouth 2 (two) times daily with a meal.      . clopidogrel (PLAVIX) 75 MG tablet Take 1 tablet (75 mg total) by mouth daily.  90 tablet  0  . furosemide (LASIX) 40 MG tablet Take 2 tablets (80 mg total) by mouth daily.  60 tablet  0  . insulin NPH-regular (NOVOLIN 70/30) (70-30) 100 UNIT/ML injection Inject 20-25 Units into the skin daily with breakfast.      . nitroGLYCERIN (NITROSTAT) 0.4 MG SL tablet Place 0.4 mg under the tongue every 5 (five) minutes as needed. For chest pain      . albuterol (PROVENTIL) (2.5 MG/3ML) 0.083% nebulizer solution Take 3 mLs (2.5 mg total) by nebulization every 6 (six) hours as needed for wheezing.  75 mL  12  . ipratropium (ATROVENT) 0.02 % nebulizer solution Take 2.5 mLs (500 mcg total) by nebulization every 6 (six) hours.  75 mL  12  .  Ipratropium-Albuterol (COMBIVENT) 20-100 MCG/ACT AERS respimat Inhale 1 puff into the lungs every 6 (six) hours.  1 Inhaler  12   No current facility-administered medications on file prior to visit.    Review of Systems:  As per HPI- otherwise negative.   Physical Examination: Filed Vitals:   10/18/13 1516  BP: 84/44  Pulse: 94  Temp: 98.2 F (36.8 C)  Resp: 18   Filed Vitals:   10/18/13 1516  Height: 5\' 10"  (1.778 m)  Weight: 146 lb (66.225 kg)   Body mass index is 20.95 kg/(m^2). Ideal Body Weight: Weight in (lb) to have BMI = 25: 173.9  GEN: WDWN, NAD, Non-toxic, A & O x 3, thin build.  Hard of hearing HEENT: Atraumatic, Normocephalic. Neck supple. No masses, No LAD. Ears and Nose: No external deformity. CV: RRR, No M/G/R. No JVD. No thrill. No extra heart sounds. PULM: CTA B, no wheezes, crackles, rhonchi. No retractions. No resp. distress. No accessory muscle use. ABD: S, NT, ND EXTR: No c/c/e NEURO Normal gait.  PSYCH: Normally interactive. Conversant. Not depressed or anxious appearing.  Calm demeanor.  Legs are in compression stockings,  Edema appears much improved  EKG: paced rhythm.    Called and discussed with Dr. Mingo Moon, attending from his recent admission.   Assessment and Plan: Hypoxemia  James Moon is here today for follow-up from hospitalization. He has a history of COPD and CHF, however recent EF looked ok at 40- 45%. He was diuresed and sent home yesterday.  However today noted to have an O2 sat of 82% on room air, to the upper 70s with walking about 50 yards.    Placed IV for access and 2L of O2 via [place].    Transfer back to hospital via EMS.  Likely needs home O2.    Signed James Blinks, MD

## 2013-10-18 NOTE — Discharge Summary (Signed)
Family Medicine Teaching Service  Discharge Note : Attending Jeff Grier Vu MD Pager [phone] Inpatient Team Pager:  [phone]  I have reviewed this patient and the patient's chart and have discussed discharge planning with the resident at the time of discharge. I agree with the discharge plan as above.    

## 2013-10-18 NOTE — Progress Notes (Signed)
CRITICAL VALUE ALERT  Critical value received:  Troponin 2.31  Date of notification:  10/18/13  Time of notification:  2155  Critical value read back:yes  Nurse who received alert:  D. Aleene Davidson, RN  MD notified (1st page):  Dr. Skeet Simmer  Time of first page:  2222  MD notified (2nd page):  Time of second page:  Responding MD:  Dr. Skeet Simmer  Time MD responded:  2223  New orders received.

## 2013-10-18 NOTE — Progress Notes (Signed)
Interval Progress Note  Spoke with Dr Colon Flattery covering for [facility] regarding troponin 2.31 and EKG changes (flipped T waves). Patient is asymptomatic and currently vitals are stable. Likely type II NSTEMI from hypotension. - Dr Colon Flattery recommends to not start heparin drip as pt is asymptomatic, and to continue cycling troponins and dose asa 325mg  x 1 now. Will place order, f/u troponins, and check AM EKG.  - If pt becomes symptomatic, will start heparin drip.  James Sinclair, MD

## 2013-10-18 NOTE — Consult Note (Signed)
Primary cardiologist: Dr. Liam Rogers Consulting cardiologist: Dr. Arnoldo Lenis  Clinical Summary James Moon is a 68 y.o.male history of chronic systolic heart failure LVEF 40-45% by echo 09/2013, ICD CAD with prior CABG 1996,  with prior stents 2012, COPD, admitted with hypotension and SOB. Recent admission with decompensated heart failure. Patient seen by his pcp today and noted to have low blood pressures and sats in 70s. Patient denies any significant symptoms. No SOB, no chest pain, no palpitations. Reports medication and diet compliance. From notes bp was somewhat soft during recent admission, and medical therapy was limited for his heart failure. He was discharged only on coreg 3.125mg  bid as well as lasix 80mg  daily just yesterday.   Initial hospital vitals 113/61 with pulse 85, sats 94%.      No Known Allergies  Medications Scheduled Medications: . aspirin EC  81 mg Oral Daily  . atorvastatin  40 mg Oral q1800  . carvedilol  3.125 mg Oral BID WC  . [START ON 10/19/2013] clopidogrel  75 mg Oral Q breakfast  . furosemide  80 mg Oral Daily  . heparin  5,000 Units Subcutaneous 3 times per day  . [START ON 10/19/2013] insulin aspart protamine- aspart  20 Units Subcutaneous Q breakfast  . ipratropium-albuterol  3 mL Inhalation Q6H  . sodium chloride  3 mL Intravenous Q12H     Infusions:     PRN Medications:  acetaminophen, acetaminophen   Past Medical History  Diagnosis Date  . Diabetes mellitus   . Colon cancer     s/p colectomy   . Hypertension   . CAD (coronary artery disease)     a. CABG in 1996. b. NSTEMI 06/2011: DES x 2 to SVG to 1st and 2nd OM and SVG to PDA.  . Ischemic cardiomyopathy Dec. 2012    a. EF 20 to 25% 2012. b. 30-35% echo 3/13.  . Tobacco abuse   . COPD (chronic obstructive pulmonary disease)   . Chronic systolic CHF (congestive heart failure)   . Chronic respiratory failure     a. On home O2 - due to COPD.    Past Surgical History    Procedure Laterality Date  . Colon surgery    . Open heart surgery    . Coronary stent placement  Dec. 2012    DES to SVG to 1st and 2nd OM and SVG to the PDA  . Coronary artery bypass graft    . Coronary angioplasty    . Colon surgery      FOR HISTORY OF COLON CANCER    Family History  Problem Relation Age of Onset  . Heart attack Father   . Diabetes Father   . Breast cancer Mother     Social History James Moon reports that he has been smoking Cigarettes.  He started smoking about 19 months ago. He has a 25 pack-year smoking history. He has never used smokeless tobacco. James Moon reports that he does not drink alcohol.  Review of Systems CONSTITUTIONAL: No weight loss, fever, chills, weakness or fatigue.  HEENT: Eyes: No visual loss, blurred vision, double vision or yellow sclerae. No hearing loss, sneezing, congestion, runny nose or sore throat.  SKIN: No rash or itching.  CARDIOVASCULAR: No chest pain, chest pressure or chest discomfort. No palpitations or edema.  RESPIRATORY: No shortness of breath, cough or sputum.  GASTROINTESTINAL: No anorexia, nausea, vomiting or diarrhea. No abdominal pain or blood.  GENITOURINARY: no polyuria, no dysuria NEUROLOGICAL: No  headache, dizziness, syncope, paralysis, ataxia, numbness or tingling in the extremities. No change in bowel or bladder control.  MUSCULOSKELETAL: No muscle, back pain, joint pain or stiffness.  HEMATOLOGIC: No anemia, bleeding or bruising.  LYMPHATICS: No enlarged nodes. No history of splenectomy.  PSYCHIATRIC: No history of depression or anxiety.      Physical Examination Blood pressure 102/57, pulse 77, temperature 98.3 F (36.8 C), temperature source Oral, resp. rate 15, height 5\' 10"  (1.778 m), weight 141 lb (63.957 kg), SpO2 95.00%.  Intake/Output Summary (Last 24 hours) at 10/18/13 2011 Last data filed at 10/18/13 2010  Gross per 24 hour  Intake    240 ml  Output    550 ml  Net   -310 ml     HEENT: sclera clear, no palpable adenopathy  Cardiovascular: RRR, no m/r/g, no jvd  Respiratory: decreased breath sounds in bases, faint expiratory wheezing  GI: abdomen soft, nt, nd  MSK: trace bilateral edema  Neuro: no focal deficits  Psych: appropriate affect   Lab Results  Basic Metabolic Panel:  Recent Labs Lab 10/15/13 1503 10/16/13 0706 10/17/13 0355  NA 141 143 139  K 4.4 4.6 4.4  CL 95* 95* 91*  CO2 34* 36* 36*  GLUCOSE 211* 182* 201*  BUN 19 17 24*  CREATININE 1.07 1.06 1.06  CALCIUM 8.9 9.3 9.1    Liver Function Tests:  Recent Labs Lab 10/16/13 0706  AST 17  ALT 12  ALKPHOS 65  BILITOT 0.9  PROT 6.8  ALBUMIN 3.3*    CBC:  Recent Labs Lab 10/15/13 1503 10/16/13 0706 10/17/13 0355  WBC 6.5 5.3 5.5  NEUTROABS 5.3  --   --   HGB 15.2 16.0 14.0  HCT 51.7 54.4* 47.9  MCV 90.5 90.2 90.2  PLT 103* 94* 98*    Cardiac Enzymes:  Recent Labs Lab 10/15/13 1625 10/15/13 2348 10/16/13 0706  TROPONINI <0.30 <0.30 <0.30    BNP: No components found with this basename: POCBNP,    ECG NSR, LBBB  Imaging 10/17/13 Echo - Left ventricle: The cavity size was normal. Wall thickness was increased in a pattern of mild LVH. Systolic function was mildly to moderately reduced. The estimated ejection fraction was in the range of 40% to 45%. There is hypokinesis of the mid-distalanteroseptal myocardium. There is akinesis of the apical myocardium. Doppler parameters are consistent with abnormal left ventricular relaxation (grade 1 diastolic dysfunction). Doppler parameters are consistent with high ventricular filling pressure. - Mitral valve: Calcified annulus. - Right ventricle: The cavity size was mildly dilated. Systolic function was mildly to moderately reduced. - Tricuspid valve: Moderate regurgitation. - Pulmonary arteries: PA peak pressure: 75mm Hg (S).      Impression/Recommendations  1. Hypotension - noted low bp's in  pcp office, patient has been asymptomatic. Blood pressure since admission has been WNL - will hold coreg for now, consider changing to low dose Toprol XL tomorrow for less bp effect  2. Chronic systolic heart failure - appears to have some extra volume by exam, his CXR and labs are still pending - continue oral laxix tonight, pending xray and lab results and bp may give iv dose tomorrow AM   Carlyle Dolly, M.D., F.A.C.C.

## 2013-10-18 NOTE — Discharge Summary (Signed)
[facility] Discharge Summary  Patient name: James Moon Medical record number: [id number] Date of birth: 13-Mar-1946 Age: 68 y.o. Gender: male Date of Admission: 10/15/2013  Date of Discharge: 10/17/13 Admitting Physician: Alveda Reasons, MD  Primary Care Provider: Marjorie Smolder, MD Consultants: none  Indication for Hospitalization: AECHF  Discharge Diagnoses/Problem List:  HFrEF (EF 40-45%) Venous status ulcers  Hx of CAD: CABG in 1996. b. NSTEMI 06/2011: DES x 2 to SVG to 1st and 2nd OM and SVG to PDA. AICD DM2  Disposition: home  Discharge Condition: stable  Brief Hospital Course:  James Moon is a 68 y.o. male presenting with AECHF. PMH is significant for HFrEF, COPD, CAD, DM. Troponins were negative and CT angio negative for PE. He improved with diuresis with IV lasix. ECHO was repeated and showed EF 52-84% and Systolic function was mildly to moderately reduced. His Coreg was continued; ACE-I and spironolactone were considered but not started as his BP likely would not tolerated them.  He was discharge to follow-up with his PCP for optimization of his HFrEF medications and ongoing diuresis needs.     # LE wounds: Concern for cellulitis in LE by EDPA, though patient with bilateral skin changes consistent with the amount of volume in his LE. No antibiotics given as the patient was afebrile with normal WBC and skin findings consistent with venous statis ulcers. Advised smoking cessation. Given compression socks.  # Cardiac: Hx of CAD; CABG in 1996; NSTEMI 06/2011; DES x 2 to SVG to 1st and 2nd OM and SVG to PDA. AICD. Troponins were negative. Continued plavix, ASA, and lipitor   # DM: last A1c 8.5 2012.  A1c 10.0% this admission. Resumed NPH (70/30) on discharge- Advised PCP visit to adjust this as needed.   Issues for Follow Up:   Smoking Cessation  Advised Low salt diet - adjust lasix as needed  Advised Carb mod diet - adjust Insulin  as needed  Optimize BP meds for HFrEF if BP will tolerate  Significant Procedures:   Significant Labs and Imaging:   Recent Labs Lab 10/15/13 1503 10/16/13 0706 10/17/13 0355  WBC 6.5 5.3 5.5  HGB 15.2 16.0 14.0  HCT 51.7 54.4* 47.9  PLT 103* 94* 98*    Recent Labs Lab 10/15/13 1503 10/16/13 0706 10/17/13 0355  NA 141 143 139  K 4.4 4.6 4.4  CL 95* 95* 91*  CO2 34* 36* 36*  GLUCOSE 211* 182* 201*  BUN 19 17 24*  CREATININE 1.07 1.06 1.06  CALCIUM 8.9 9.3 9.1  ALKPHOS  --  65  --   AST  --  17  --   ALT  --  12  --   ALBUMIN  --  3.3*  --      Recent Labs Lab 10/15/13 1625 10/15/13 2348 10/16/13 0706  TROPONINI <0.30 <0.30 <0.30   BNP (last 3 results)  Recent Labs  04/22/13 1240 10/15/13 1503  PROBNP 1054.0* 2626.0*   Dg Chest 2 View  10/15/2013   CLINICAL DATA:  Shortness of breath  EXAM: CHEST  2 VIEW  COMPARISON:  04/21/2013  . IMPRESSION: No acute abnormality noted.     Ct Angio Chest Pe W/cm &/or Wo Cm  10/15/2013   CLINICAL DATA:  Hypoxemic.  Elevated D-dimer.  EXAM: CT ANGIOGRAPHY CHEST WITH CONTRAST  TECHNIQUE: Multidetector CT imaging of the chest was performed using the standard protocol during bolus administration of intravenous contrast. Multiplanar CT image reconstructions and  MIPs were obtained to evaluate the vascular anatomy.  CONTRAST:  124mL OMNIPAQUE IOHEXOL 350 MG/ML SOLN  COMPARISON:  CT ANGIO CHEST W/CM &/OR WO/CM dated 04/22/2013; DG CHEST 2V dated 10/15/2013  FINDINGS: Technically adequate study with good opacification of the central and segmental pulmonary arteries. No focal filling defects are demonstrated. No evidence of significant pulmonary embolus.  Mild cardiac enlargement. Coronary artery calcifications. Postoperative changes consistent with CABG. Moderately prominent hilar and subcarinal lymph nodes bilaterally are upper limits of normal by size criteria and are probably reactive. Appearance is similar to previous study.  Esophagus is decompressed. Diffuse emphysematous changes in the lungs. Small bilateral pleural effusions with basilar atelectasis. Focal tree-in-bud infiltrates in the right lung base may represent inflammatory process/ pneumonia. No pneumothorax. Visualized upper abdominal organs are grossly unremarkable except for vascular calcifications. No destructive bone lesions.  Review of the MIP images confirms the above findings.  IMPRESSION: No evidence of significant pulmonary embolus. Small bilateral pleural effusions with basilar atelectasis. Suggestion of early inflammatory/infectious process in the right base. Mediastinal and hilar lymph nodes are upper limits of normal, likely reactive.   Electronically Signed   By: Lucienne Capers M.D.   On: 10/15/2013 23:57   Results/Tests Pending at Time of Discharge: none  Discharge Medications:    Medication List         albuterol (2.5 MG/3ML) 0.083% nebulizer solution  Commonly known as:  PROVENTIL  Take 3 mLs (2.5 mg total) by nebulization every 6 (six) hours as needed for wheezing.     aspirin EC 81 MG tablet  Take 81 mg by mouth daily.     atorvastatin 40 MG tablet  Commonly known as:  LIPITOR  Take 1 tablet (40 mg total) by mouth daily.     carvedilol 3.125 MG tablet  Commonly known as:  COREG  Take 3.125 mg by mouth 2 (two) times daily with a meal.     clopidogrel 75 MG tablet  Commonly known as:  PLAVIX  Take 1 tablet (75 mg total) by mouth daily.     furosemide 40 MG tablet  Commonly known as:  LASIX  Take 2 tablets (80 mg total) by mouth daily.     insulin NPH-regular Human (70-30) 100 UNIT/ML injection  Commonly known as:  NOVOLIN 70/30  Inject 20-25 Units into the skin daily with breakfast.     ipratropium 0.02 % nebulizer solution  Commonly known as:  ATROVENT  Take 2.5 mLs (500 mcg total) by nebulization every 6 (six) hours.     Ipratropium-Albuterol 20-100 MCG/ACT Aers respimat  Commonly known as:  COMBIVENT  Inhale 1  puff into the lungs every 6 (six) hours.     nitroGLYCERIN 0.4 MG SL tablet  Commonly known as:  NITROSTAT  Place 0.4 mg under the tongue every 5 (five) minutes as needed. For chest pain        Discharge Instructions: Please refer to Patient Instructions section of EMR for full details.  Patient was counseled important signs and symptoms that should prompt return to medical care, changes in medications, dietary instructions, activity restrictions, and follow up appointments.   Follow-Up Appointments: Follow-up Information   Follow up with Darden Amber., MD. Schedule an appointment as soon as possible for a visit in 1 week.   Specialty:  Cardiology   Contact information:   [place] [address] [address] [phone]       Follow up with [place]. Schedule  an appointment as soon as possible for a visit in 1 week.   Contact information:   [place] [id number] [phone]      Phill Myron, MD 10/18/2013, 3:05 PM PGY-1, [place]

## 2013-10-18 NOTE — H&P (Signed)
[facility] Admission History and Physical Service Pager: [phone]  Patient name: James Moon Medical record number: [id number] Date of birth: [birth year] Age: 68 y.o. Gender: male  Primary Care Provider: Marjorie Smolder, MD Consultants: Cardiology Code Status: Full code per discussion with pt  Chief Complaint: Hypotension  Assessment and Plan: James Moon is a 68 y.o. male presenting with hypotension and hypoxemia. PMH is significant for HFrEF, COPD, CAD, DM, tobacco use, ischemic cardiomyopathy and h/o colon cancer s/p colectomy.  # Hypotension: patient currently normotensive and not tachycardic. Currently hemodynamically stable. Patient does not have any symptoms. Was recently discharged with hypotension. Patient has significant cardiac history. He is currently on Coreg 3.125mg  BID. Also discharged on furosemide 80mg  QD, which is an increase from previous furosemide 40mg  QD regimen. Concerns include cardiogenic etiology.  Admit to inpatient, step-down unit, attending Dr. Ree Kida overnight.  Consult cardiology - appreciate and awaiting recs.  Continue coreg - will decrease / hold 1 dose if patient becomes hypotensive again.  EKG now  Troponin x3  CBC and Bmet  # Hypoxemia: O2 sat dropped in PCP office to 70s%. Along with hypotension, PCP decided to transfer to [facility] for readmission. Here, patient is stable with O2 sat in 90s on 1L O2. Last admission, patient had not qualified for home O2.   Management per above with EKG, troponins, lasix, and cards consult.  For now, will not obtain CT angiogram chest as 4/17 it was neg for PE. Will also not repeat d-dimer as this was positive 4/17. Wells criteria score 0-1 (if consider h/o colon cancer, though uncertain time).  CXR to evaluate for fluid overload, though patient appears to be less overloaded than on previous admission.  If needed, re-attempt qualification for home O2 on d/c  # CAD:  history of CABG 1996, NSTEMI 06/2011 with multiple stents   Continue home aspirin  Continue home carvedilol 3.125mg  BID. May need to decrease if BP drops.  Continue home plavix  Continue home atorvastatin 40mg   # HFrEF: last echo on 4/19 and showed EF of 40-45% with LVH and grade I diastolic dysfunction.   Managed as above  Daily weights  # COPD: currently not moving much air but not symptomatic (no cough or dyspnea).  Continue home Combivent (ordered as Duonebs inpatient)  Oxygen therapy, keep O2 >92%, wean as tolerated  Chest x-ray  # Diabetes mellitus, type 2: last A1c of 10 on 10/16/13.  Continue home Novolog 70/30 20 units with breakfast daily  CBG qAC x3   FEN/GI: Cardiac/carb modified diet, KVO Prophylaxis: Heparin subq. Will need to watch platelets closely.  Disposition: Admit to stepdown for tonight, probably transfer out tomorrow morning  History of Present Illness: James Moon is a 68 y.o. male presenting with hypotension and hypoxemia. He was at his PCPs office today for a hospital follow-up and was found to be hypotensive to 80s/40s and hypoxemic to 80s, dropping to 70s on room air during ambulation. His PCP wanted him managed in the hospital because of his unstable vitals. Patient reports no loss of consciousness, no lightheadedness or dizziness, no shortness of breath, no chest pain, no coughing or wheezing, no fevers, no nausea or vomiting, no constipation or diarrhea. He is unsure why he had to come to the hospital because he feels totally like himself.   He was discharged yesterday from an admission with similar presentation. Upon arriving, he was placed on 2L [place] and hemodynamically stable.  Review Of Systems:  Per HPI with the following additions: None Otherwise 12 point review of systems was performed and was unremarkable.  Patient Active Problem List   Diagnosis Date Noted  . Hypoxemia 10/15/2013  . CHF exacerbation 10/15/2013  . Smoker  05/12/2013  . Acute on chronic systolic CHF (congestive heart failure) 04/22/2013  . Chronic respiratory failure 04/22/2013  . Edema, peripheral 12/09/2012  . 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 Dyspnea 06/26/2011  . COPD GOLD III 06/13/2011  . CAD (coronary artery disease) 06/07/2011  . Chronic systolic heart failure 76/28/3151  . IDDM (insulin dependent diabetes mellitus) 06/07/2011  . Ischemic cardiomyopathy 06/01/2011   Past Medical History: Past Medical History  Diagnosis Date  . Diabetes mellitus   . Colon cancer     s/p colectomy   . Hypertension   . CAD (coronary artery disease)     a. CABG in 1996. b. NSTEMI 06/2011: DES x 2 to SVG to 1st and 2nd OM and SVG to PDA.  . Ischemic cardiomyopathy Dec. 2012    a. EF 20 to 25% 2012. b. 30-35% echo 3/13.  . Tobacco abuse   . COPD (chronic obstructive pulmonary disease)   . Chronic systolic CHF (congestive heart failure)   . Chronic respiratory failure     a. On home O2 - due to COPD.   Past Surgical History: Past Surgical History  Procedure Laterality Date  . Colon surgery    . Open heart surgery    . Coronary stent placement  Dec. 2012    DES to SVG to 1st and 2nd OM and SVG to the PDA  . Coronary artery bypass graft    . Coronary angioplasty    . Colon surgery      FOR HISTORY OF COLON CANCER   Social History: History  Substance Use Topics  . Smoking status: Current Every Day Smoker -- 0.50 packs/day for 50 years    Types: Cigarettes    Start date: 02/26/2012  . Smokeless tobacco: Never Used     Comment: he has tried to quit many times.  . Alcohol Use: No   Additional social history: Lives alone. Every day smoker, trying to quit. Please also refer to relevant sections of EMR.  Family History: Family History  Problem Relation Age of Onset  . Heart attack Father   . Diabetes Father   . Breast cancer Mother    Allergies and Medications: No Known Allergies No current facility-administered  medications on file prior to encounter.   Current Outpatient Prescriptions on File Prior to Encounter  Medication Sig Dispense Refill  . albuterol (PROVENTIL) (2.5 MG/3ML) 0.083% nebulizer solution Take 3 mLs (2.5 mg total) by nebulization every 6 (six) hours as needed for wheezing.  75 mL  12  . aspirin EC 81 MG tablet Take 81 mg by mouth daily.       Marland Kitchen atorvastatin (LIPITOR) 40 MG tablet Take 1 tablet (40 mg total) by mouth daily.  90 tablet  1  . carvedilol (COREG) 3.125 MG tablet Take 3.125 mg by mouth 2 (two) times daily with a meal.      . clopidogrel (PLAVIX) 75 MG tablet Take 1 tablet (75 mg total) by mouth daily.  90 tablet  0  . furosemide (LASIX) 40 MG tablet Take 2 tablets (80 mg total) by mouth daily.  60 tablet  0  . insulin NPH-regular (NOVOLIN 70/30) (70-30) 100 UNIT/ML injection Inject 20-25 Units into the skin daily with breakfast.      .  ipratropium (ATROVENT) 0.02 % nebulizer solution Take 2.5 mLs (500 mcg total) by nebulization every 6 (six) hours.  75 mL  12  . Ipratropium-Albuterol (COMBIVENT) 20-100 MCG/ACT AERS respimat Inhale 1 puff into the lungs every 6 (six) hours.  1 Inhaler  12  . nitroGLYCERIN (NITROSTAT) 0.4 MG SL tablet Place 0.4 mg under the tongue every 5 (five) minutes as needed. For chest pain        Objective: BP 113/61  Pulse 85  Resp 19  SpO2 94%  Exam: General: Sitting up in bed, pleasant, thin, in no acute distress HEENT: Left pupil 95mm, right 15mm (pt states chronic issue), EOMI, dry oral mucosa, no dentition Cardiovascular: Regular rate and rhythm, no murmur, no JVD appreciated, distant. 1+ bilateral DP pulses Respiratory: Crackles at bases on right more than left, poor air movement bilaterally, very intermittent scattered wheezes, speaking in full sentences with normal effort Abdomen: Soft, non-tender, non-distended, RUQ fullness but no discernible liver edge Extremities: 1+ pitting edema up to knees bilaterally with 2+ pitting edema in ankles,  TED hose on bilaterally, very thin hands Skin: Areas of ecchymosis on forearms bilaterally which appear to be from recent IV placements, no cyanosis  Neuro: Alert, oriented x3, U/L extremity strength 5/5, cranial nerves intact, normal speech  Labs and Imaging:  No labs this admission   Cordelia Poche, MD 10/18/2013, 5:52 PM PGY-1, [place] [facility] pager: [phone], text pages welcome  I have seen and examined patient with Dr Lonny Prude and agree with his assessment and plan with my additions in blue. Hilton Sinclair, MD PGY-2, [place]

## 2013-10-19 DIAGNOSIS — R0609 Other forms of dyspnea: Secondary | ICD-10-CM

## 2013-10-19 DIAGNOSIS — I214 Non-ST elevation (NSTEMI) myocardial infarction: Secondary | ICD-10-CM

## 2013-10-19 DIAGNOSIS — J9610 Chronic respiratory failure, unspecified whether with hypoxia or hypercapnia: Secondary | ICD-10-CM

## 2013-10-19 DIAGNOSIS — J449 Chronic obstructive pulmonary disease, unspecified: Secondary | ICD-10-CM

## 2013-10-19 DIAGNOSIS — I959 Hypotension, unspecified: Secondary | ICD-10-CM

## 2013-10-19 DIAGNOSIS — Z794 Long term (current) use of insulin: Secondary | ICD-10-CM

## 2013-10-19 DIAGNOSIS — R0902 Hypoxemia: Secondary | ICD-10-CM

## 2013-10-19 DIAGNOSIS — I2589 Other forms of chronic ischemic heart disease: Secondary | ICD-10-CM

## 2013-10-19 DIAGNOSIS — F17200 Nicotine dependence, unspecified, uncomplicated: Secondary | ICD-10-CM

## 2013-10-19 DIAGNOSIS — I2510 Atherosclerotic heart disease of native coronary artery without angina pectoris: Secondary | ICD-10-CM

## 2013-10-19 DIAGNOSIS — I5023 Acute on chronic systolic (congestive) heart failure: Secondary | ICD-10-CM

## 2013-10-19 DIAGNOSIS — R0989 Other specified symptoms and signs involving the circulatory and respiratory systems: Secondary | ICD-10-CM

## 2013-10-19 DIAGNOSIS — I5042 Chronic combined systolic (congestive) and diastolic (congestive) heart failure: Secondary | ICD-10-CM | POA: Diagnosis present

## 2013-10-19 DIAGNOSIS — Z95810 Presence of automatic (implantable) cardiac defibrillator: Secondary | ICD-10-CM

## 2013-10-19 DIAGNOSIS — I509 Heart failure, unspecified: Secondary | ICD-10-CM

## 2013-10-19 DIAGNOSIS — E119 Type 2 diabetes mellitus without complications: Secondary | ICD-10-CM

## 2013-10-19 LAB — PROTIME-INR
INR: 1 (ref 0.00–1.49)
Prothrombin Time: 13 seconds (ref 11.6–15.2)

## 2013-10-19 LAB — CBC
HCT: 47.2 % (ref 39.0–52.0)
HEMOGLOBIN: 13.8 g/dL (ref 13.0–17.0)
MCH: 26.3 pg (ref 26.0–34.0)
MCHC: 29.2 g/dL — AB (ref 30.0–36.0)
MCV: 90.1 fL (ref 78.0–100.0)
Platelets: 89 10*3/uL — ABNORMAL LOW (ref 150–400)
RBC: 5.24 MIL/uL (ref 4.22–5.81)
RDW: 17 % — AB (ref 11.5–15.5)
WBC: 4.6 10*3/uL (ref 4.0–10.5)

## 2013-10-19 LAB — GLUCOSE, CAPILLARY
GLUCOSE-CAPILLARY: 356 mg/dL — AB (ref 70–99)
GLUCOSE-CAPILLARY: 271 mg/dL — AB (ref 70–99)
GLUCOSE-CAPILLARY: 264 mg/dL — AB (ref 70–99)
Glucose-Capillary: 321 mg/dL — ABNORMAL HIGH (ref 70–99)
Glucose-Capillary: 180 mg/dL — ABNORMAL HIGH (ref 70–99)

## 2013-10-19 LAB — BASIC METABOLIC PANEL
BUN: 40 mg/dL — AB (ref 6–23)
CALCIUM: 9.2 mg/dL (ref 8.4–10.5)
CO2: 37 mEq/L — ABNORMAL HIGH (ref 19–32)
CREATININE: 1.31 mg/dL (ref 0.50–1.35)
Chloride: 90 mEq/L — ABNORMAL LOW (ref 96–112)
GFR calc Af Amer: 63 mL/min — ABNORMAL LOW (ref >90)
GFR, EST NON AFRICAN AMERICAN: 55 mL/min — AB (ref >90)
GLUCOSE: 339 mg/dL — AB (ref 70–99)
POTASSIUM: 4.4 meq/L (ref 3.7–5.3)
Sodium: 139 mEq/L (ref 137–147)

## 2013-10-19 LAB — TROPONIN I
TROPONIN I: 3.15 ng/mL — AB (ref <0.30)
Troponin I: 1.73 ng/mL (ref <0.30)

## 2013-10-19 LAB — HEPARIN LEVEL (UNFRACTIONATED): HEPARIN UNFRACTIONATED: 0.58 [IU]/mL (ref 0.30–0.70)

## 2013-10-19 MED ORDER — INSULIN ASPART 100 UNIT/ML ~~LOC~~ SOLN
0.0000 [IU] | Freq: Three times a day (TID) | SUBCUTANEOUS | Status: DC
  Administered 2013-10-19 – 2013-10-20 (×2): 2 [IU] via SUBCUTANEOUS
  Administered 2013-10-20: 3 [IU] via SUBCUTANEOUS
  Administered 2013-10-21: 5 [IU] via SUBCUTANEOUS
  Administered 2013-10-21: 09:00:00 7 [IU] via SUBCUTANEOUS
  Administered 2013-10-22 (×2): 5 [IU] via SUBCUTANEOUS

## 2013-10-19 MED ORDER — INSULIN ASPART 100 UNIT/ML ~~LOC~~ SOLN
3.0000 [IU] | Freq: Three times a day (TID) | SUBCUTANEOUS | Status: DC
  Administered 2013-10-19 – 2013-10-22 (×5): 3 [IU] via SUBCUTANEOUS

## 2013-10-19 MED ORDER — MOMETASONE FURO-FORMOTEROL FUM 200-5 MCG/ACT IN AERO
2.0000 | INHALATION_SPRAY | Freq: Two times a day (BID) | RESPIRATORY_TRACT | Status: DC
  Administered 2013-10-19 – 2013-10-22 (×5): 2 via RESPIRATORY_TRACT
  Filled 2013-10-19 (×3): qty 8.8

## 2013-10-19 MED ORDER — INSULIN ASPART 100 UNIT/ML ~~LOC~~ SOLN
0.0000 [IU] | Freq: Every day | SUBCUTANEOUS | Status: DC
  Administered 2013-10-20: 3 [IU] via SUBCUTANEOUS

## 2013-10-19 MED ORDER — HEPARIN BOLUS VIA INFUSION
3000.0000 [IU] | Freq: Once | INTRAVENOUS | Status: AC
  Administered 2013-10-19: 3000 [IU] via INTRAVENOUS
  Filled 2013-10-19: qty 3000

## 2013-10-19 MED ORDER — ASPIRIN EC 81 MG PO TBEC: 81.0000 mg | DELAYED_RELEASE_TABLET | Freq: Every day | ORAL | Status: DC

## 2013-10-19 MED ORDER — ASPIRIN 81 MG PO CHEW
81.0000 mg | CHEWABLE_TABLET | ORAL | Status: AC
  Administered 2013-10-20: 81 mg via ORAL
  Filled 2013-10-19: qty 1

## 2013-10-19 MED ORDER — HEPARIN (PORCINE) IN NACL 100-0.45 UNIT/ML-% IJ SOLN
1000.0000 [IU]/h | INTRAMUSCULAR | Status: DC
  Administered 2013-10-19: 1000 [IU]/h via INTRAVENOUS
  Filled 2013-10-19 (×2): qty 250

## 2013-10-19 MED ORDER — INSULIN ASPART PROT & ASPART (70-30 MIX) 100 UNIT/ML ~~LOC~~ SUSP
25.0000 [IU] | Freq: Every day | SUBCUTANEOUS | Status: DC
  Filled 2013-10-19: qty 10

## 2013-10-19 MED ORDER — INSULIN GLARGINE 100 UNIT/ML ~~LOC~~ SOLN
20.0000 [IU] | Freq: Every day | SUBCUTANEOUS | Status: DC
  Administered 2013-10-19 – 2013-10-22 (×3): 20 [IU] via SUBCUTANEOUS
  Filled 2013-10-19 (×4): qty 0.2

## 2013-10-19 MED ORDER — SODIUM CHLORIDE 0.9 % IV SOLN
INTRAVENOUS | Status: DC
  Administered 2013-10-20: 10 mL/h via INTRAVENOUS

## 2013-10-19 NOTE — Progress Notes (Signed)
Patient: James Moon / Admit Date: 10/18/2013 / Date of Encounter: 10/19/2013, 1:17 PM  Subjective  No CP. No SOB. No LEE. No complaints at present, actually. Denies syncope since 03/2013.  Objective   Telemetry: NSR, frequent desats by waveform, 5 beat NSVT  Physical Exam: Blood pressure 101/52, pulse 87, temperature 98.2 F (36.8 C), temperature source Oral, resp. rate 21, height 5\' 10"  (1.778 m), weight 138 lb 14.2 oz (63 kg), SpO2 100.00%. General: Well developed, well nourished WM in no acute distress. Head: Normocephalic, atraumatic, sclera non-icteric, no xanthomas, nares are without discharge. Neck:  JVP not elevated. Lungs: Markedly diminished BS throughout. No wheezes, rales, or rhonchi. Breathing is unlabored. Heart: RRR S1 S2 without murmurs, rubs, or gallops.  Abdomen: Soft, non-tender, non-distended with normoactive bowel sounds. No rebound/guarding. Extremities: No clubbing or cyanosis. Mildly erythematous bilaterally. Bilateral striated skin stretch markings indicative of prior significant LEE though none is present at this time.  Neuro: Alert and oriented X 3. Moves all extremities spontaneously. Hard of hearing. Psych:  Responds to questions appropriately with a normal affect.   Intake/Output Summary (Last 24 hours) at 10/19/13 1317 Last data filed at 10/19/13 1216  Gross per 24 hour  Intake    720 ml  Output   2200 ml  Net  -1480 ml    Inpatient Medications:  . aspirin EC  81 mg Oral Daily  . atorvastatin  40 mg Oral q1800  . clopidogrel  75 mg Oral Q breakfast  . heparin  5,000 Units Subcutaneous 3 times per day  . insulin aspart  0-5 Units Subcutaneous QHS  . insulin aspart  0-9 Units Subcutaneous TID WC  . insulin aspart protamine- aspart  20 Units Subcutaneous Q breakfast  . insulin aspart protamine- aspart  25 Units Subcutaneous Q supper  . ipratropium-albuterol  3 mL Inhalation Q6H  . mometasone-formoterol  2 puff Inhalation BID  . sodium  chloride  3 mL Intravenous Q12H   Infusions:    Labs:  Recent Labs  10/18/13 1940 10/19/13 0114  NA 138 139  K 4.9 4.4  CL 90* 90*  CO2 37* 37*  GLUCOSE 386* 339*  BUN 40* 40*  CREATININE 1.17 1.31  CALCIUM 9.2 9.2   No results found for this basename: AST, ALT, ALKPHOS, BILITOT, PROT, ALBUMIN,  in the last 72 hours  Recent Labs  10/18/13 1940 10/19/13 0114  WBC 5.4 4.6  HGB 14.4 13.8  HCT 48.2 47.2  MCV 88.6 90.1  PLT 98* 89*    Recent Labs  10/18/13 2117 10/19/13 0114 10/19/13 0832  TROPONINI 2.31* 3.15* 1.73*   No components found with this basename: POCBNP,  No results found for this basename: HGBA1C,  in the last 72 hours   Radiology/Studies:  Dg Chest 2 View  10/19/2013   CLINICAL DATA:  Hypoxemia  EXAM: CHEST  2 VIEW  COMPARISON:  CT ANGIO CHEST W/CM &/OR WO/CM dated 10/15/2013; DG CHEST 2V dated 10/15/2013  FINDINGS: Postoperative changes in the mediastinum. Cardiac pacemaker. Normal heart size and pulmonary vascularity. Emphysematous changes and scattered fibrosis in the lungs. No developing infiltration or edema since previous study. Small bilateral pleural effusions are suggested.  IMPRESSION: Small bilateral pleural effusions. No developing airspace disease or consolidation.   Electronically Signed   By: Lucienne Capers M.D.   On: 10/19/2013 02:01   Dg Chest 2 View  10/15/2013   CLINICAL DATA:  Shortness of breath  EXAM: CHEST  2 VIEW  COMPARISON:  04/21/2013  FINDINGS: A pacing device is again seen and stable. Postsurgical changes are again noted. The cardiac shadow is stable. The lungs are well aerated bilaterally and mildly hyperinflated. No focal infiltrate or sizable effusion is seen. No acute bony abnormality is noted.  IMPRESSION: No acute abnormality noted.   Electronically Signed   By: Inez Catalina M.D.   On: 10/15/2013 13:36   Ct Angio Chest Pe W/cm &/or Wo Cm  10/15/2013   CLINICAL DATA:  Hypoxemic.  Elevated D-dimer.  EXAM: CT ANGIOGRAPHY CHEST  WITH CONTRAST  TECHNIQUE: Multidetector CT imaging of the chest was performed using the standard protocol during bolus administration of intravenous contrast. Multiplanar CT image reconstructions and MIPs were obtained to evaluate the vascular anatomy.  CONTRAST:  14mL OMNIPAQUE IOHEXOL 350 MG/ML SOLN  COMPARISON:  CT ANGIO CHEST W/CM &/OR WO/CM dated 04/22/2013; DG CHEST 2V dated 10/15/2013  FINDINGS: Technically adequate study with good opacification of the central and segmental pulmonary arteries. No focal filling defects are demonstrated. No evidence of significant pulmonary embolus.  Mild cardiac enlargement. Coronary artery calcifications. Postoperative changes consistent with CABG. Moderately prominent hilar and subcarinal lymph nodes bilaterally are upper limits of normal by size criteria and are probably reactive. Appearance is similar to previous study. Esophagus is decompressed. Diffuse emphysematous changes in the lungs. Small bilateral pleural effusions with basilar atelectasis. Focal tree-in-bud infiltrates in the right lung base may represent inflammatory process/ pneumonia. No pneumothorax. Visualized upper abdominal organs are grossly unremarkable except for vascular calcifications. No destructive bone lesions.  Review of the MIP images confirms the above findings.  IMPRESSION: No evidence of significant pulmonary embolus. Small bilateral pleural effusions with basilar atelectasis. Suggestion of early inflammatory/infectious process in the right base. Mediastinal and hilar lymph nodes are upper limits of normal, likely reactive.   Electronically Signed   By: Lucienne Capers M.D.   On: 10/15/2013 23:57     Assessment and Plan  1. Hypotension currently stable 2. Acute respiratory failure/hypoxia - 70s on RA at PCP, CT angio neg for PE on 4/17 2. Chronic systolic CHF - EF 08-67% by echo 09/2013 (previously 30-35% in 2013) - received 80mg  IV Lasix on 4/20 3. CAD s/p CABG 1996, PCI 2012 with  NSTEMI this admission, peak troponin 3.15 5. COPD, not previously on home O2 6. Thrombocytopenia of unclear cause, appears worsened than prior levels  7. Diabetes mellitus uncontrolled A1C 10 on 4/18 8. Pulmonary hypertension with PAP 12mmHg with mod TR by echo 09/2013 9. H/o syncope 03/2013 in setting of hypotension 10. 5 beats SVT - has ICD  Overall clinical picture is concerning to me with NSTEMI, hypoxia, and elevated PA pressures on recent echocardiogram along with history of syncope, although he is presently asymptomatic. Will discuss anticoag & R&LHC with MD.   Signed, Melina Copa PA-C  I have seen and examined the patient along with Dayna Dunn PA-C.  I have reviewed the chart, notes and new data.  I agree with PA's note.  Presentation was unusual (not angina or worsening dyspnea,had asymptomatic hypotension), but labs are consistent with a NSTEMI that occurred 24-48 h before admission. ECG shows worsened ST depression in the lateral leads. Still angina free. BP better.  PLAN: Right and left heart catheterization in AM, possible PCI stent. Suspect evaluation will show a marked reduction in LVEF with a regional pattern. Thrombocytopenia is noted - mild, not prohibitive for cath/PCI. Easy bruising, but no serious bleeding issues. Start heparin IV with CBC monitoring. He  quit smoking this month, but his exam suggests severe emphysema (very diminished breath sounds, "pink puffer" phenotype), although not described as such on CT chest.  Sanda Klein, MD, [facility] [phone] 10/19/2013, 2:25 PM

## 2013-10-19 NOTE — Progress Notes (Signed)
Seen and examined.  Agree with Dr. Milagros Reap documentation and management.

## 2013-10-19 NOTE — Progress Notes (Signed)
Inpatient Diabetes Program Recommendations  AACE/ADA: New Consensus Statement on Inpatient Glycemic Control (2013)  Target Ranges:  Prepandial:   less than 140 mg/dL      Peak postprandial:   less than 180 mg/dL (1-2 hours)      Critically ill patients:  140 - 180 mg/dL   Reason for Visit: Diabetes out of control  Diabetes history: Type 2, on insulin Outpatient Diabetes medications: 70/30 20 units every am and 25 units every pm Current orders for Inpatient glycemic control:  Home doses of 70/30 and Novolog sensitive correction and HS correction  Note:  A1C is 10, indicating mean glucose of 240 mg/dl prior to admission.  Patient currently has no PCP.  Has not seen a doctor about his diabetes in "quite a long time". Saw Dr. Edilia Bo at [facility] in hopes of having her become his PCP-- was sent to the hospital.  Still plans to ask Dr. Edilia Bo to be his PCP.  [facility] diabetes education after discharge.  Will place referral.  Will ask nursing staff to review basic diabetes self-care.  Thank you.  Meliton Samad S. Marcelline Mates, RN, MSN, CDE Inpatient Diabetes Program, team pager [phone]

## 2013-10-19 NOTE — H&P (Addendum)
[facility] Attending Note  I personally saw and evaluated the patient. The plan of care was discussed with the resident team. I agree with the assessment and plan as documented by the resident.   68 y/o male with PMH systolic heart failure, diastolic heart failure, CAD, COPD, DM, tobacco use, and history of colon cancer s/p colectomy re-admitted to [facility] on 10/18/13 with hypotension and hypoxia. Patient was recently admitted for CHF exacerbation and hypotension. Patient was seen at [facility] yesterday for hospital follow up and found to be hypoxic and hypotensive. Please refer to resident note for additional HPI. Patient reports no shortness of breath this morning, no chest pain, no lightheadedness, no vision changes, no nausea/emesis, no abdominal pain, no headaches. He does report intermittent leg pain (primarily in the calves) when ambulating greater than 100 yards, the pain is relieve after a few minutes of rest. He reports never being on an inhaler for his COPD however her his account has had PFT's and is diagnosed with COPD. He is currently treated for diabetes with Novolin 70/30 20 units in the AM and 25 units in the PM.  Vitals: reviewed Gen: pleasant caucasian male, NAD, nasal canula in place (2L) HEENT: normocephalic, PERRL, EOMI, no scleral icterus, nasal septum midline, MMM, uvula midline, neck supple, no anterior or posterior cervical adenopathy Cardiac: RRR, S1 and S2 present, no murmurs, no heaves/thrills, no JVD, no carotid bruits Resp: crackles at bases, decreased air entry in all lung fields Abd: soft, no tenderness, no rebound, no guarding, normal bowel sounds Ext: trace pedal edema, 2+radial pulses bilaterally, faint DP pulses bilaterally, unable to palpate PT pulses, normal capillary refill, decreased hair growth on feet, feet are warm Neuro: A&O X3, CN 2-12 intact, moving all extremities  Reviewed lab work, CXR, and EKG  Assessment and Plan: 68 y/o male admitted with hypoxemia and  hypotension  1. Hypoxemia - likely due to CHF exacerbation (EF 40-45%), COPD may be contributing, appreciate Cardiology input, continue with diuresis and supplemental oxygen 2. Elevated troponin/CAD - likely secondary to hypotension, continue to trend troponins, cardiology following, repeat EKG this AM, continue home ASA/Plavix 3. Hypotension - improved since stopping BB, consider low dose Toprol if blood pressure able to tolerate 4. IDT2DM - ISS while hospitalized, consider transitioning to home regimen of 70/30 20 units in the AM and 25 units in the PM 5. COPD - Duonebs as inpatient, consider addition of Spiriva to home Combivent prior to discharge 6. Intermittent Claudication - patient has symptoms consistent with intermittent claudication, would recommend ABI's  Dossie Arbour MD

## 2013-10-19 NOTE — Progress Notes (Signed)
ANTICOAGULATION CONSULT NOTE - Initial Consult  Pharmacy Consult for Heparin Indication: chest pain/ACS  No Known Allergies  Patient Measurements: Height: 5\' 10"  (177.8 cm) Weight: 138 lb 14.2 oz (63 kg) IBW/kg (Calculated) : 73  Vital Signs: Temp: 98.2 F (36.8 C) (04/21 1216) Temp src: Oral (04/21 1216) BP: 101/52 mmHg (04/21 1215) Pulse Rate: 87 (04/21 1217)  Labs:  Recent Labs  10/17/13 0355 10/18/13 1940 10/18/13 2117 10/19/13 0114 10/19/13 0832  HGB 14.0 14.4  --  13.8  --   HCT 47.9 48.2  --  47.2  --   PLT 98* 98*  --  89*  --   CREATININE 1.06 1.17  --  1.31  --   TROPONINI  --   --  2.31* 3.15* 1.73*    Estimated Creatinine Clearance: 48.8 ml/min (by C-G formula based on Cr of 1.31).   Medical History: Past Medical History  Diagnosis Date  . Diabetes mellitus   . Colon cancer     s/p colectomy   . Hypertension   . CAD (coronary artery disease)     a. CABG in 1996. b. NSTEMI 06/2011: DES x 2 to SVG to 1st and 2nd OM and SVG to PDA.  . Ischemic cardiomyopathy Dec. 2012    a. EF 20 to 25% 2012. b. 30-35% echo 3/13.  . Tobacco abuse   . COPD (chronic obstructive pulmonary disease)   . Chronic systolic CHF (congestive heart failure)   . Chronic respiratory failure     a. On home O2 - due to COPD.    Assessment: 68 year old male to begin heparin for NSTEMI Sq heparin dose at 5 am  Goal of Therapy:  Heparin level 0.3-0.7 units/ml Monitor platelets by anticoagulation protocol: Yes   Plan:  1) Heparin 3000 units iv bolus x 1 2) Heparin drip at 1000 units / hr 3) 6 hour heparin level 4) Daily heparin level, CBC  Thank you. Anette Guarneri, PharmD [phone]  Tad Moore 10/19/2013,2:40 PM

## 2013-10-19 NOTE — Progress Notes (Signed)
Utilization review completed.  

## 2013-10-19 NOTE — Progress Notes (Signed)
Family Medicine Teaching Service Daily Progress Note Intern Pager: [phone]  Patient name: James Moon Medical record number: [id number] Date of birth: [birth year] Age: 68 y.o. Gender: male  Primary Care Provider: Marjorie Smolder, MD Consultants: Cardiology Code Status: full  Pt Overview and Major Events to Date:  4/21: Hypotension & Hypoxia   Assessment and Plan: James Moon is a 68 y.o. male presenting with hypotension and hypoxemia. PMH is significant for HFrEF, COPD, CAD, DM, tobacco use, ischemic cardiomyopathy and h/o colon cancer s/p colectomy.   # Hypotension: patient currently normotensive and not tachycardic. Currently hemodynamically stable. Patient does not have any symptoms. Was recently discharged with hypotension. Patient has significant cardiac history. He is currently on Coreg 3.125mg  BID. Also discharged on furosemide 80mg  QD, which is an increase from previous furosemide 40mg  QD regimen. Concerns include cardiogenic etiology.  Admit to inpatient, step-down unit, attending Dr. Ree Kida overnight.  Holding Coreg (Home 3.125 BID) Holding Lasix (Home 80mg  PO qd) pending Cardiology  EKG: New Twave inversion V5-V6 w/ LBBB Troponin: Elevated 2.3 --> 3.15; Likely due to Type 2 NSTEMI. No current CP. Will continue to follow   Consult cardiology - appreciate and awaiting recs.   Consider low dose Toprol XL for less bp effect  # Hypoxemia: O2 sat dropped in PCP office to 70s%. Along with hypotension, PCP decided to transfer to [facility] for readmission. Here, patient is stable with O2 sat in 90s on 1L O2. Last admission, patient had not qualified for home O2.  Management per above with EKG, troponins, lasix, and cards consult.  For now, will not obtain CT angiogram chest as 4/17 it was neg for PE. Will also not repeat d-dimer as this was positive 4/17. Wells criteria score 0-1 (if consider h/o colon cancer, though uncertain time).  CXR: No edema  Oxygen stats dropped to  ~70s on RA while resting in bed DME for home O2 placed   # CAD: history of CABG 1996, NSTEMI 06/2011 with multiple stents  Continue home aspirin, plavix, atorvastatin 40  Holding meds as above  # HFrEF: last echo on 4/19 and showed EF of 40-45% with LVH and grade I diastolic dysfunction.  Managed as above  Daily weights; I&O Net -1L Cardiology consulted  # COPD: currently not moving much air but not symptomatic (no cough or dyspnea).  Continue home Combivent (ordered as Duonebs inpatient)  Oxygen therapy, keep O2 >92%, wean as tolerated   Chest x-ray: Small bilateral pleural effusions. No developing airspace disease or consolidation.    Dulera BID (started 4/21)  # Diabetes mellitus, type 2: last A1c of 10 on 10/16/13.  Start Lantus 20qd + Meal-time coverage 3u + Sensitive SSI  Discontinue Novolog 70/30 (Home doses 20 qam & 25 qpm )   FEN/GI: Cardiac/carb modified diet, KVO  Prophylaxis: Heparin subq. Will need to watch platelets closely.   Disposition: Admit to stepdown for tonight, probably transfer out tomorrow morning  Subjective:  No complaint today. Denies CP, SOB, or palpitations.   Objective: Temp:  [97.9 F (36.6 C)-98.9 F (37.2 C)] 98.4 F (36.9 C) (04/21 0729) Pulse Rate:  [77-94] 85 (04/21 0729) Resp:  [12-21] 20 (04/21 0729) BP: (84-119)/(44-64) 105/57 mmHg (04/21 0729) SpO2:  [74 %-100 %] 93 % (04/21 0814) Weight:  [138 lb 14.2 oz (63 kg)-146 lb (66.225 kg)] 138 lb 14.2 oz (63 kg) (04/21 0434) Physical Exam: General: Sitting up in bed, pleasant, thin, in no acute distress  HEENT: Left pupil 67mm, right  68mm (pt states chronic issue), EOMI, dry oral mucosa, no dentition  Cardiovascular: Regular rate and rhythm, no murmur, no JVD appreciated, distant. 1+ bilateral DP pulses  Respiratory: Crackles at bases on right more than left, poor air movement bilaterally, very intermittent scattered wheezes, speaking in full sentences with normal effort on Room  air Extremities: 1+ pitting edema in ankles, TED hose on bilaterally, very thin hands  Skin: Areas of ecchymosis on forearms bilaterally which appear to be from recent IV placements, no cyanosis   Laboratory:  Recent Labs Lab 10/17/13 0355 10/18/13 1940 10/19/13 0114  WBC 5.5 5.4 4.6  HGB 14.0 14.4 13.8  HCT 47.9 48.2 47.2  PLT 98* 98* 89*    Recent Labs Lab 10/16/13 0706 10/17/13 0355 10/18/13 1940 10/19/13 0114  NA 143 139 138 139  K 4.6 4.4 4.9 4.4  CL 95* 91* 90* 90*  CO2 36* 36* 37* 37*  BUN 17 24* 40* 40*  CREATININE 1.06 1.06 1.17 1.31  CALCIUM 9.3 9.1 9.2 9.2  PROT 6.8  --   --   --   BILITOT 0.9  --   --   --   ALKPHOS 65  --   --   --   ALT 12  --   --   --   AST 17  --   --   --   GLUCOSE 182* 201* 386* 339*     Recent Labs Lab 10/15/13 1625 10/15/13 2348 10/16/13 0706 10/18/13 2117 10/19/13 0114  TROPONINI <0.30 <0.30 <0.30 2.31* 3.15*    Imaging/Diagnostic Tests: Dg Chest 2 View  10/19/2013   CLINICAL DATA:  Hypoxemia  EXAM: CHEST  2 VIEW  COMPARISON:  CT ANGIO CHEST W/CM &/OR WO/CM dated 10/15/2013; DG CHEST 2V dated 10/15/2013  FINDINGS: Postoperative changes in the mediastinum. Cardiac pacemaker. Normal heart size and pulmonary vascularity. Emphysematous changes and scattered fibrosis in the lungs. No developing infiltration or edema since previous study. Small bilateral pleural effusions are suggested.  IMPRESSION: Small bilateral pleural effusions. No developing airspace disease or consolidation.     Phill Myron, MD 10/19/2013, 8:17 AM PGY-1, [place] Intern pager: [phone], text pages welcome

## 2013-10-19 NOTE — Progress Notes (Signed)
ANTICOAGULATION CONSULT NOTE  Pharmacy Consult for Heparin Indication: chest pain/ACS  No Known Allergies  Patient Measurements: Height: 5\' 10"  (177.8 cm) Weight: 138 lb 0.1 oz (62.6 kg) (A SCALE) IBW/kg (Calculated) : 73  Vital Signs: Temp: 97.5 F (36.4 C) (04/21 1918) Temp src: Oral (04/21 1918) BP: 91/46 mmHg (04/21 1918) Pulse Rate: 88 (04/21 1918)  Labs:  Recent Labs  10/17/13 0355 10/18/13 1940 10/18/13 2117 10/19/13 0114 10/19/13 0832 10/19/13 1546 10/19/13 2243  HGB 14.0 14.4  --  13.8  --   --   --   HCT 47.9 48.2  --  47.2  --   --   --   PLT 98* 98*  --  89*  --   --   --   LABPROT  --   --   --   --   --  13.0  --   INR  --   --   --   --   --  1.00  --   HEPARINUNFRC  --   --   --   --   --   --  0.58  CREATININE 1.06 1.17  --  1.31  --   --   --   TROPONINI  --   --  2.31* 3.15* 1.73*  --   --     Estimated Creatinine Clearance: 48.4 ml/min (by C-G formula based on Cr of 1.31).  Assessment: 68 year old male with chest pain for heparin  Goal of Therapy:  Heparin level 0.3-0.7 units/ml Monitor platelets by anticoagulation protocol: Yes   Plan:  Continue Heparin at current rate Follow-up am labs.  Bronson Curb Gurshaan Matsuoka 10/19/2013,11:28 PM

## 2013-10-19 NOTE — Discharge Summary (Signed)
[facility] Discharge Summary  Patient name: James Moon Medical record number: [id number] Date of birth: 1946-02-27 Age: 67 y.o. Gender: male Date of Admission: 10/18/2013  Date of Discharge: 10/22/13 Admitting Physician: Zigmund Gottron, MD  Primary Care Provider: No primary provider on file. Consultants: Cardiology  Indication for Hospitalization: Hypoxia and hypotension  Discharge Diagnoses/Problem List:  HFrEF PCI COPD - new oxygen requirement Diabetes Tobacco abuse  Disposition: home  Discharge Condition: stable  Brief Hospital Course:  James Moon is a 68 y.o. male presenting with hypotension and hypoxemia at PCP. PMH is significant for HFrEF, COPD, CAD, DM, tobacco use, ischemic cardiomyopathy and h/o colon cancer s/p colectomy. He was normotensive on admission with oxygen saturations > 90 on 1L [place]; However troponins were elevated likely due to type 2 NSTEMI. Cardiology was consulted, taken to CATH lab for successful percutaneous coronary intervention to the SVG-OM1. His hypoxia improved with [place] O2 which was continued at discharge. His blood pressure improved s/p PCI and switching beta-blocker medication.   # NSTEMI: History of CABG 1996, NSTEMI 06/2011 with multiple stents; last echo on 4/19 and showed EF of 40-45% with LVH and grade I diastolic dysfunction. Elevated troponin's on admission w/o CP. Taken to [facility] lab for successful percutaneous coronary intervention to the SVG-OM1 (with unsuccessful attempt at filter wire for distal protection) with angiosculpt/3.0x15 mm Resolute DES stenting of the proximal stenosis post dilated to 3.41 mm and the 75% stenosis being reduced to 0% and angiosculpt/NCPTCA of the 95% in-stent restenosis of the mid vessel with a 95% stenosis being reduced to 0%. Switched from [employer] to [employer]. Placed on Sliding scale lasix (as below). Recommend starting ACE-I if BP will tolerate in the future. Continue ASA  and Plavix.   Sliding scale Lasix: Currently at dry weight (136 lbs) based on CATH. Pt advised to weigh himself when he gets home, then Daily in the Morning. His dry weight will be what the scale says on the day he return home.  If he gains more than 3 pounds from dry weight: Increase the Lasix dosing to 80 mg in the morning and 40 mg in the afternoon until weight returns to baseline dry weight.  If weight gain is greater than 5 pounds in 2 days: Increased to Lasix 80 mg twice a day and contact the office for further assistance if weight does not go down the next day.  If the weight goes down more than 3 pounds from dry weight: Hold Lasix until it returns to baseline dry weight  # Pulm: Hypoxemia/COPD: O2 sat dropped in PCP office to 70s%. Last admission, patient had not qualified for home O2, but did qualified this admission. Chest x-ray showed small bilateral pleural effusions w/o developing airspace disease or consolidation. Symbicort was started and Combivent continued. Discharged on continue home O2 @ 3 L.    # Endo: Diabetes mellitus, type 2: Last A1c of 10 on 10/16/13. Switch from 70/30 to Lantus 20 qd + Meal-time coverage 3u. Will need insulin regimen titrated.    Issues for Follow Up:   Confirm taking Metoprolol XL and stopped Coreg  Discuss Sliding scale lasix to confirm understanding and compliance   Started Symbicort and O2 [place] @ 3L - titrate as needed  Titrate Insulin regimen as needed  Significant Procedures: PCI   Significant Labs and Imaging:   Recent Labs Lab 10/19/13 0114 10/20/13 0500 10/21/13 0520  WBC 4.6 5.4 5.7  HGB 13.8 15.2 14.3  HCT 47.2 50.7 48.2  PLT 89* PLATELET CLUMPS NOTED ON SMEAR, COUNT APPEARS DECREASED 100*    Recent Labs Lab 10/16/13 0706  10/18/13 1940 10/19/13 0114 10/20/13 0500 10/21/13 0520 10/21/13 1655  NA 143  < > 138 139 141 131* 136*  K 4.6  < > 4.9 4.4 4.6 5.9* 5.2  CL 95*  < > 90* 90* 95* 92* 94*  CO2 36*  < > 37* 37* 36* 21  29  GLUCOSE 182*  < > 386* 339* 114* 387* 218*  BUN 17  < > 40* 40* 27* 24* 27*  CREATININE 1.06  < > 1.17 1.31 1.03 0.98 1.10  CALCIUM 9.3  < > 9.2 9.2 9.4 9.0 9.3  ALKPHOS 65  --   --   --   --   --   --   AST 17  --   --   --   --   --   --   ALT 12  --   --   --   --   --   --   ALBUMIN 3.3*  --   --   --   --   --   --   < > = values in this interval not displayed.     Recent Labs Lab 10/15/13 2348 10/16/13 0706 10/18/13 2117 10/19/13 0114 10/19/13 0832  TROPONINI <0.30 <0.30 2.31* 3.15* 1.73*    Recent Labs Lab 10/20/13 0904 10/20/13 0908  PHART  --  7.310*  PCO2ART  --  76.1*  PO2ART  --  70.0*  HCO3 35.3* 38.4*  TCO2 38 41  O2SAT 60.0 91.0   Dg Chest 2 View 10/19/2013  IMPRESSION: Small bilateral pleural effusions. No developing airspace disease or consolidation.     Results/Tests Pending at Time of Discharge: none  Discharge Medications:    Medication List    STOP taking these medications       carvedilol 3.125 MG tablet  Commonly known as:  COREG     insulin NPH-regular Human (70-30) 100 UNIT/ML injection  Commonly known as:  NOVOLIN 70/30      TAKE these medications       albuterol (2.5 MG/3ML) 0.083% nebulizer solution  Commonly known as:  PROVENTIL  Take 3 mLs (2.5 mg total) by nebulization every 6 (six) hours as needed for wheezing.     aspirin EC 81 MG tablet  Take 81 mg by mouth daily.     atorvastatin 40 MG tablet  Commonly known as:  LIPITOR  Take 1 tablet (40 mg total) by mouth daily.     budesonide-formoterol 160-4.5 MCG/ACT inhaler  Commonly known as:  SYMBICORT  Inhale 2 puffs into the lungs 2 (two) times daily.     clopidogrel 75 MG tablet  Commonly known as:  PLAVIX  Take 1 tablet (75 mg total) by mouth daily.     furosemide 40 MG tablet  Commonly known as:  LASIX  Take 1 tablet (40 mg total) by mouth 2 (two) times daily.     Insulin Glargine 100 UNIT/ML Solostar Pen  Commonly known as:  LANTUS SOLOSTAR  Inject 20  Units into the skin daily at 10 pm.     insulin regular 100 units/mL injection  Commonly known as:  HUMULIN R  Inject 0.03 mLs (3 Units total) into the skin 3 (three) times daily before meals.     Insulin Syringes (Disposable) U-100 0.3 ML Misc  1 Syringe by Does not apply route  3 (three) times daily.     ipratropium 0.02 % nebulizer solution  Commonly known as:  ATROVENT  Take 2.5 mLs (500 mcg total) by nebulization every 6 (six) hours.     Ipratropium-Albuterol 20-100 MCG/ACT Aers respimat  Commonly known as:  COMBIVENT  Inhale 1 puff into the lungs every 6 (six) hours.     metoprolol succinate 25 MG 24 hr tablet  Commonly known as:  TOPROL-XL  Take 1 tablet (25 mg total) by mouth daily.     NEEDLE (DISP) 22 G 22G X 3/4" Misc  Commonly known as:  B-D DISP NEEDLE 22GX3/4"  1 Syringe by Does not apply route daily.     nitroGLYCERIN 0.4 MG SL tablet  Commonly known as:  NITROSTAT  Place 0.4 mg under the tongue every 5 (five) minutes as needed. For chest pain        Discharge Instructions: Please refer to Patient Instructions section of EMR for full details.  Patient was counseled important signs and symptoms that should prompt return to medical care, changes in medications, dietary instructions, activity restrictions, and follow up appointments.   Follow-Up Appointments: Follow-up Information   Follow up with Richardson Dopp, PA-C On 10/28/2013. (12:10 PM)    Specialty:  Physician Assistant   Contact information:   [address] [place] [address] [phone]       Follow up with [facility]. (Registered Nurse and Physical Therapy Services to start within 24-48 hours of discharge)    Contact information:   [address] [place] [address] [phone]       Follow up with [facility]. (Home oxygen services and rolling walker provided at time of discharge)    Contact information:   [address] [place] [address] [phone]       Schedule an appointment as soon as possible for a visit with Lamar Blinks, MD.   Specialty:  Family Medicine   Contact information:   [place] [address] [phone]       Please follow up. (For hospital follow-up)       Phill Myron, MD 10/22/2013, 11:27 AM PGY-1, [place]

## 2013-10-20 ENCOUNTER — Encounter (HOSPITAL_COMMUNITY)
Admission: AD | Disposition: A | Discharge status: Home Health | Payer: Medicare Other | Source: Ambulatory Visit | Attending: Family Medicine

## 2013-10-20 ENCOUNTER — Other Ambulatory Visit: Payer: Self-pay

## 2013-10-20 DIAGNOSIS — I214 Non-ST elevation (NSTEMI) myocardial infarction: Principal | ICD-10-CM

## 2013-10-20 DIAGNOSIS — I25810 Atherosclerosis of coronary artery bypass graft(s) without angina pectoris: Secondary | ICD-10-CM

## 2013-10-20 HISTORY — PX: LEFT AND RIGHT HEART CATHETERIZATION WITH CORONARY/GRAFT ANGIOGRAM: SHX5448

## 2013-10-20 LAB — CBC
HEMATOCRIT: 50.7 % (ref 39.0–52.0)
HEMOGLOBIN: 15.2 g/dL (ref 13.0–17.0)
MCH: 26.9 pg (ref 26.0–34.0)
MCHC: 30 g/dL (ref 30.0–36.0)
MCV: 89.6 fL (ref 78.0–100.0)
Platelets: DECREASED 10*3/uL (ref 150–400)
RBC: 5.66 MIL/uL (ref 4.22–5.81)
RDW: 17.2 % — ABNORMAL HIGH (ref 11.5–15.5)
WBC: 5.4 10*3/uL (ref 4.0–10.5)

## 2013-10-20 LAB — GLUCOSE, CAPILLARY
GLUCOSE-CAPILLARY: 176 mg/dL — AB (ref 70–99)
GLUCOSE-CAPILLARY: 177 mg/dL — AB (ref 70–99)
Glucose-Capillary: 118 mg/dL — ABNORMAL HIGH (ref 70–99)
Glucose-Capillary: 243 mg/dL — ABNORMAL HIGH (ref 70–99)
Glucose-Capillary: 291 mg/dL — ABNORMAL HIGH (ref 70–99)

## 2013-10-20 LAB — POCT I-STAT 3, VENOUS BLOOD GAS (G3P V)
ACID-BASE EXCESS: 5 mmol/L — AB (ref 0.0–2.0)
Bicarbonate: 35.3 mEq/L — ABNORMAL HIGH (ref 20.0–24.0)
O2 SAT: 60 %
PO2 VEN: 37 mmHg (ref 30.0–45.0)
TCO2: 38 mmol/L (ref 0–100)
pCO2, Ven: 74.6 mmHg (ref 45.0–50.0)
pH, Ven: 7.283 (ref 7.250–7.300)

## 2013-10-20 LAB — BASIC METABOLIC PANEL
BUN: 27 mg/dL — AB (ref 6–23)
CHLORIDE: 95 meq/L — AB (ref 96–112)
CO2: 36 mEq/L — ABNORMAL HIGH (ref 19–32)
Calcium: 9.4 mg/dL (ref 8.4–10.5)
Creatinine, Ser: 1.03 mg/dL (ref 0.50–1.35)
GFR calc non Af Amer: 73 mL/min — ABNORMAL LOW (ref >90)
GFR, EST AFRICAN AMERICAN: 85 mL/min — AB (ref >90)
Glucose, Bld: 114 mg/dL — ABNORMAL HIGH (ref 70–99)
POTASSIUM: 4.6 meq/L (ref 3.7–5.3)
SODIUM: 141 meq/L (ref 137–147)

## 2013-10-20 LAB — POCT I-STAT 3, ART BLOOD GAS (G3+)
ACID-BASE EXCESS: 8 mmol/L — AB (ref 0.0–2.0)
Bicarbonate: 38.4 mEq/L — ABNORMAL HIGH (ref 20.0–24.0)
O2 Saturation: 91 %
TCO2: 41 mmol/L (ref 0–100)
pCO2 arterial: 76.1 mmHg (ref 35.0–45.0)
pH, Arterial: 7.31 — ABNORMAL LOW (ref 7.350–7.450)
pO2, Arterial: 70 mmHg — ABNORMAL LOW (ref 80.0–100.0)

## 2013-10-20 LAB — HEPARIN LEVEL (UNFRACTIONATED): HEPARIN UNFRACTIONATED: 0.45 [IU]/mL (ref 0.30–0.70)

## 2013-10-20 LAB — POCT ACTIVATED CLOTTING TIME: Activated Clotting Time: 476 seconds

## 2013-10-20 SURGERY — LEFT AND RIGHT HEART CATHETERIZATION WITH CORONARY/GRAFT ANGIOGRAM
Anesthesia: LOCAL

## 2013-10-20 MED ORDER — CLOPIDOGREL BISULFATE 75 MG PO TABS
ORAL_TABLET | ORAL | Status: AC
  Administered 2013-10-21: 75 mg via ORAL
  Filled 2013-10-20: qty 1

## 2013-10-20 MED ORDER — MIDAZOLAM HCL 2 MG/2ML IJ SOLN
INTRAMUSCULAR | Status: AC
  Filled 2013-10-20: qty 2

## 2013-10-20 MED ORDER — CLOPIDOGREL BISULFATE 75 MG PO TABS
ORAL_TABLET | ORAL | Status: AC
  Filled 2013-10-20: qty 1

## 2013-10-20 MED ORDER — NITROGLYCERIN 0.2 MG/ML ON CALL CATH LAB
INTRAVENOUS | Status: AC
  Filled 2013-10-20: qty 1

## 2013-10-20 MED ORDER — HEPARIN (PORCINE) IN NACL 2-0.9 UNIT/ML-% IJ SOLN
INTRAMUSCULAR | Status: AC
Start: 2013-10-20 — End: 2013-10-20
  Filled 2013-10-20: qty 1000

## 2013-10-20 MED ORDER — SODIUM CHLORIDE 0.9 % IV SOLN: INTRAVENOUS | Status: DC

## 2013-10-20 MED ORDER — HEART ATTACK BOUNCING BOOK
Freq: Once | Status: AC
  Administered 2013-10-20: 22:00:00
  Filled 2013-10-20: qty 1

## 2013-10-20 MED ORDER — LIDOCAINE HCL (PF) 1 % IJ SOLN
INTRAMUSCULAR | Status: AC
  Filled 2013-10-20: qty 30

## 2013-10-20 MED ORDER — BIVALIRUDIN 250 MG IV SOLR
INTRAVENOUS | Status: AC
  Filled 2013-10-20: qty 250

## 2013-10-20 MED ORDER — IPRATROPIUM-ALBUTEROL 0.5-2.5 (3) MG/3ML IN SOLN
3.0000 mL | Freq: Four times a day (QID) | RESPIRATORY_TRACT | Status: DC
  Administered 2013-10-20 – 2013-10-22 (×6): 3 mL via RESPIRATORY_TRACT
  Filled 2013-10-20 (×7): qty 3

## 2013-10-20 MED ORDER — CLOPIDOGREL BISULFATE 75 MG PO TABS: 75.0000 mg | ORAL_TABLET | Freq: Every day | ORAL | Status: DC

## 2013-10-20 MED ORDER — FENTANYL CITRATE 0.05 MG/ML IJ SOLN
INTRAMUSCULAR | Status: AC
  Filled 2013-10-20: qty 2

## 2013-10-20 MED ORDER — ASPIRIN EC 81 MG PO TBEC
81.0000 mg | DELAYED_RELEASE_TABLET | Freq: Every day | ORAL | Status: DC
  Administered 2013-10-21 – 2013-10-22 (×2): 81 mg via ORAL
  Filled 2013-10-20 (×3): qty 1

## 2013-10-20 NOTE — Progress Notes (Signed)
Site area: right groin  Site Prior to Removal:  Level 0  Pressure Applied For 20 MINUTES    Minutes Beginning at 1610  Manual:   yes  Patient Status During Pull:  AAO X 4  Post Pull Groin Site:  Level 0  Post Pull Instructions Given:  yes  Post Pull Pulses Present:  yes  Dressing Applied:  yes  Comments:  Tolerated procedure well

## 2013-10-20 NOTE — Progress Notes (Signed)
Seen and examined. Discussed with Dr. Berkley Harvey.  Agree with his management and documentation.  Appreciate great cards help.  Per cath, his ischemic cardiomyopathy sure does explain a lot.  My hope is that now, with better perfusion, his EF and BP will improve and allow [name] to instigate the usual core measures for systolic dysfunction CHF (B Blocker and ACE for sure.  Lasix as indicated by volume status.  Perhaps dig and spironolactone.)

## 2013-10-20 NOTE — Progress Notes (Signed)
ANTICOAGULATION CONSULT NOTE  Pharmacy Consult for Heparin Indication: chest pain/ACS  No Known Allergies  Patient Measurements: Height: 5\' 10"  (177.8 cm) Weight: 136 lb (61.689 kg) (a scale) IBW/kg (Calculated) : 73  Vital Signs: Temp: 97.9 F (36.6 C) (04/22 0539) Temp src: Oral (04/22 0539) BP: 114/63 mmHg (04/22 0539) Pulse Rate: 87 (04/22 0539)  Labs:  Recent Labs  10/18/13 1940 10/18/13 2117 10/19/13 0114 10/19/13 0832 10/19/13 1546 10/19/13 2243 10/20/13 0500  HGB 14.4  --  13.8  --   --   --  15.2  HCT 48.2  --  47.2  --   --   --  50.7  PLT 98*  --  89*  --   --   --  PENDING  LABPROT  --   --   --   --  13.0  --   --   INR  --   --   --   --  1.00  --   --   HEPARINUNFRC  --   --   --   --   --  0.58 0.45  CREATININE 1.17  --  1.31  --   --   --  1.03  TROPONINI  --  2.31* 3.15* 1.73*  --   --   --     Estimated Creatinine Clearance: 60.7 ml/min (by C-G formula based on Cr of 1.03).  Assessment: 68 year old male with chest pain for heparin. Heparin level therapeutic x2 levels on 1000 units/hr. Plan for cath today  Goal of Therapy:  Heparin level 0.3-0.7 units/ml Monitor platelets by anticoagulation protocol: Yes   Plan:  Continue Heparin at current rate Follow-up am labs or post cath.  Sloan Leiter, PharmD, BCPS Clinical Pharmacist [phone] 10/20/2013,7:49 AM

## 2013-10-20 NOTE — Progress Notes (Signed)
Family Medicine Teaching Service Daily Progress Note Intern Pager: [phone]  Patient name: James Moon Medical record number: [id number] Date of birth: 11/28/1945 Age: 68 y.o. Gender: male  Primary Care Provider: Marjorie Smolder, MD Consultants: Cardiology Code Status: full  Pt Overview and Major Events to Date:  4/21: Hypotension & Hypoxia   Assessment and Plan: James Moon is a 68 y.o. male presenting with hypotension and hypoxemia. PMH is significant for HFrEF, COPD, CAD, DM, tobacco use, ischemic cardiomyopathy and h/o colon cancer s/p colectomy.   # Hypotension: Resolved patient currently normotensive and not tachycardic. Currently hemodynamically stable. Patient does not have any symptoms. Was recently discharged with hypotension. Patient has significant cardiac history. He is currently on Coreg 3.125mg  BID. Also discharged on furosemide 80mg  QD, which is an increase from previous furosemide 40mg  QD regimen. Concerns include cardiogenic etiology.  Admit to inpatient, step-down unit, attending Dr. Ree Kida overnight.  Holding Coreg (Home 3.125 BID) & Lasix (Home 80mg  PO qd)  EKG (4/21): New Twave inversion V5-V6 w/ LBBB Elevated Troponin: Improving 2.3 --> 3.15 --> 1.73; Likely Type 2 NSTEMI.   Consult cardiology - appreciate recs.   CATH today  Heparin IV - follow CBCs  Consider low dose Toprol XL for less bp effect  # Hypoxemia: O2 sat dropped in PCP office to 70s%. Along with hypotension, PCP decided to transfer to [facility] for readmission. Here, patient is stable with O2 sat in 90s on 1L O2. Last admission, patient had not qualified for home O2.  Management per above with EKG, troponins, lasix, and cards consult.  For now, will not obtain CT angiogram chest as 4/17 it was neg for PE. Will also not repeat d-dimer as this was positive 4/17. Wells criteria score 0-1 (if consider h/o colon cancer, though uncertain time).  CXR: No edema  DME for home O2 placed: Oxygen  stats dropped to ~70s on RA while resting in bed  # CAD: history of CABG 1996, NSTEMI 06/2011 with multiple stents  Continue home aspirin, plavix, atorvastatin 40   # HFrEF: last echo on 4/19 and showed EF of 40-45% with LVH and grade I diastolic dysfunction.  Daily weights 136 down from 140 on admit; I&O Net -2L Cardiology consulted: Managed as above  # COPD: currently not moving much air but not symptomatic (no cough or dyspnea).  Continue home Combivent (ordered as Duonebs inpatient)  Oxygen therapy, keep O2 >92%, wean as tolerated   Chest x-ray: Small bilateral pleural effusions. No developing airspace disease or consolidation.    Dulera BID (started 4/21)  # Diabetes mellitus, type 2: last A1c of 10 on 10/16/13.  Start Lantus 20qd + Meal-time coverage 3u + Sensitive SSI  Discontinue Novolog 70/30 (Home doses 20 qam & 25 qpm )  FEN/GI: NPO pending CATH today, KVO  Prophylaxis: Heparin subq. Will need to watch platelets closely.   Disposition: Admit to stepdown for tonight, probably transfer out tomorrow morning  Subjective:  No complaints today; getting ready for CATH. Denies CP, SOB, or palpitations.   Objective: Temp:  [97.5 F (36.4 C)-98.2 F (36.8 C)] 97.9 F (36.6 C) (04/22 0539) Pulse Rate:  [80-88] 87 (04/22 0539) Resp:  [12-21] 17 (04/22 0539) BP: (91-114)/(46-63) 114/63 mmHg (04/22 0539) SpO2:  [92 %-100 %] 94 % (04/22 0539) Weight:  [136 lb (61.689 kg)-139 lb 12.4 oz (63.4 kg)] 136 lb (61.689 kg) (04/22 0539) Physical Exam: General: Sitting up in bed, pleasant, thin, in no acute distress  HEENT: Left  pupil 58mm, right 78mm (pt states chronic issue), EOMI, dry oral mucosa, no dentition  Cardiovascular: Regular rate and rhythm, no murmur, no JVD appreciated, distant. 1+ bilateral DP pulses  Respiratory: Crackles at bases on right more than left, poor air movement bilaterally, very intermittent scattered wheezes, speaking in full sentences with normal effort on Room  air Extremities: 1+ pitting edema in ankles, TED hose on bilaterally, very thin hands  Skin: Areas of ecchymosis on forearms bilaterally which appear to be from recent IV placements, no cyanosis   Laboratory:  Recent Labs Lab 10/18/13 1940 10/19/13 0114 10/20/13 0500  WBC 5.4 4.6 PENDING  HGB 14.4 13.8 15.2  HCT 48.2 47.2 50.7  PLT 98* 89* PENDING    Recent Labs Lab 10/16/13 0706  10/18/13 1940 10/19/13 0114 10/20/13 0500  NA 143  < > 138 139 141  K 4.6  < > 4.9 4.4 4.6  CL 95*  < > 90* 90* 95*  CO2 36*  < > 37* 37* 36*  BUN 17  < > 40* 40* 27*  CREATININE 1.06  < > 1.17 1.31 1.03  CALCIUM 9.3  < > 9.2 9.2 9.4  PROT 6.8  --   --   --   --   BILITOT 0.9  --   --   --   --   ALKPHOS 65  --   --   --   --   ALT 12  --   --   --   --   AST 17  --   --   --   --   GLUCOSE 182*  < > 386* 339* 114*  < > = values in this interval not displayed.    Recent Labs Lab 10/15/13 2348 10/16/13 0706 10/18/13 2117 10/19/13 0114 10/19/13 0832  TROPONINI <0.30 <0.30 2.31* 3.15* 1.73*    Imaging/Diagnostic Tests: Dg Chest 2 View  10/19/2013   CLINICAL DATA:  Hypoxemia  EXAM: CHEST  2 VIEW  COMPARISON:  CT ANGIO CHEST W/CM &/OR WO/CM dated 10/15/2013; DG CHEST 2V dated 10/15/2013  FINDINGS: Postoperative changes in the mediastinum. Cardiac pacemaker. Normal heart size and pulmonary vascularity. Emphysematous changes and scattered fibrosis in the lungs. No developing infiltration or edema since previous study. Small bilateral pleural effusions are suggested.  IMPRESSION: Small bilateral pleural effusions. No developing airspace disease or consolidation.     Phill Myron, MD 10/20/2013, 7:32 AM PGY-1, [place] Intern pager: [phone], text pages welcome

## 2013-10-20 NOTE — H&P (View-Only) (Signed)
Patient: James Moon / Admit Date: 10/18/2013 / Date of Encounter: 10/19/2013, 1:17 PM  Subjective  No CP. No SOB. No LEE. No complaints at present, actually. Denies syncope since 03/2013.  Objective   Telemetry: NSR, frequent desats by waveform, 5 beat NSVT  Physical Exam: Blood pressure 101/52, pulse 87, temperature 98.2 F (36.8 C), temperature source Oral, resp. rate 21, height 5' 10" (1.778 m), weight 138 lb 14.2 oz (63 kg), SpO2 100.00%. General: Well developed, well nourished WM in no acute distress. Head: Normocephalic, atraumatic, sclera non-icteric, no xanthomas, nares are without discharge. Neck:  JVP not elevated. Lungs: Markedly diminished BS throughout. No wheezes, rales, or rhonchi. Breathing is unlabored. Heart: RRR S1 S2 without murmurs, rubs, or gallops.  Abdomen: Soft, non-tender, non-distended with normoactive bowel sounds. No rebound/guarding. Extremities: No clubbing or cyanosis. Mildly erythematous bilaterally. Bilateral striated skin stretch markings indicative of prior significant LEE though none is present at this time.  Neuro: Alert and oriented X 3. Moves all extremities spontaneously. Hard of hearing. Psych:  Responds to questions appropriately with a normal affect.   Intake/Output Summary (Last 24 hours) at 10/19/13 1317 Last data filed at 10/19/13 1216  Gross per 24 hour  Intake    720 ml  Output   2200 ml  Net  -1480 ml    Inpatient Medications:  . aspirin EC  81 mg Oral Daily  . atorvastatin  40 mg Oral q1800  . clopidogrel  75 mg Oral Q breakfast  . heparin  5,000 Units Subcutaneous 3 times per day  . insulin aspart  0-5 Units Subcutaneous QHS  . insulin aspart  0-9 Units Subcutaneous TID WC  . insulin aspart protamine- aspart  20 Units Subcutaneous Q breakfast  . insulin aspart protamine- aspart  25 Units Subcutaneous Q supper  . ipratropium-albuterol  3 mL Inhalation Q6H  . mometasone-formoterol  2 puff Inhalation BID  . sodium  chloride  3 mL Intravenous Q12H   Infusions:    Labs:  Recent Labs  10/18/13 1940 10/19/13 0114  NA 138 139  K 4.9 4.4  CL 90* 90*  CO2 37* 37*  GLUCOSE 386* 339*  BUN 40* 40*  CREATININE 1.17 1.31  CALCIUM 9.2 9.2   No results found for this basename: AST, ALT, ALKPHOS, BILITOT, PROT, ALBUMIN,  in the last 72 hours  Recent Labs  10/18/13 1940 10/19/13 0114  WBC 5.4 4.6  HGB 14.4 13.8  HCT 48.2 47.2  MCV 88.6 90.1  PLT 98* 89*    Recent Labs  10/18/13 2117 10/19/13 0114 10/19/13 0832  TROPONINI 2.31* 3.15* 1.73*   No components found with this basename: POCBNP,  No results found for this basename: HGBA1C,  in the last 72 hours   Radiology/Studies:  Dg Chest 2 View  10/19/2013   CLINICAL DATA:  Hypoxemia  EXAM: CHEST  2 VIEW  COMPARISON:  CT ANGIO CHEST W/CM &/OR WO/CM dated 10/15/2013; DG CHEST 2V dated 10/15/2013  FINDINGS: Postoperative changes in the mediastinum. Cardiac pacemaker. Normal heart size and pulmonary vascularity. Emphysematous changes and scattered fibrosis in the lungs. No developing infiltration or edema since previous study. Small bilateral pleural effusions are suggested.  IMPRESSION: Small bilateral pleural effusions. No developing airspace disease or consolidation.   Electronically Signed   By: William  Stevens M.D.   On: 10/19/2013 02:01   Dg Chest 2 View  10/15/2013   CLINICAL DATA:  Shortness of breath  EXAM: CHEST  2 VIEW  COMPARISON:    04/21/2013  FINDINGS: A pacing device is again seen and stable. Postsurgical changes are again noted. The cardiac shadow is stable. The lungs are well aerated bilaterally and mildly hyperinflated. No focal infiltrate or sizable effusion is seen. No acute bony abnormality is noted.  IMPRESSION: No acute abnormality noted.   Electronically Signed   By: Inez Catalina M.D.   On: 10/15/2013 13:36   Ct Angio Chest Pe W/cm &/or Wo Cm  10/15/2013   CLINICAL DATA:  Hypoxemic.  Elevated D-dimer.  EXAM: CT ANGIOGRAPHY CHEST  WITH CONTRAST  TECHNIQUE: Multidetector CT imaging of the chest was performed using the standard protocol during bolus administration of intravenous contrast. Multiplanar CT image reconstructions and MIPs were obtained to evaluate the vascular anatomy.  CONTRAST:  14mL OMNIPAQUE IOHEXOL 350 MG/ML SOLN  COMPARISON:  CT ANGIO CHEST W/CM &/OR WO/CM dated 04/22/2013; DG CHEST 2V dated 10/15/2013  FINDINGS: Technically adequate study with good opacification of the central and segmental pulmonary arteries. No focal filling defects are demonstrated. No evidence of significant pulmonary embolus.  Mild cardiac enlargement. Coronary artery calcifications. Postoperative changes consistent with CABG. Moderately prominent hilar and subcarinal lymph nodes bilaterally are upper limits of normal by size criteria and are probably reactive. Appearance is similar to previous study. Esophagus is decompressed. Diffuse emphysematous changes in the lungs. Small bilateral pleural effusions with basilar atelectasis. Focal tree-in-bud infiltrates in the right lung base may represent inflammatory process/ pneumonia. No pneumothorax. Visualized upper abdominal organs are grossly unremarkable except for vascular calcifications. No destructive bone lesions.  Review of the MIP images confirms the above findings.  IMPRESSION: No evidence of significant pulmonary embolus. Small bilateral pleural effusions with basilar atelectasis. Suggestion of early inflammatory/infectious process in the right base. Mediastinal and hilar lymph nodes are upper limits of normal, likely reactive.   Electronically Signed   By: Lucienne Capers M.D.   On: 10/15/2013 23:57     Assessment and Plan  1. Hypotension currently stable 2. Acute respiratory failure/hypoxia - 70s on RA at PCP, CT angio neg for PE on 4/17 2. Chronic systolic CHF - EF 08-67% by echo 09/2013 (previously 30-35% in 2013) - received 80mg  IV Lasix on 4/20 3. CAD s/p CABG 1996, PCI 2012 with  NSTEMI this admission, peak troponin 3.15 5. COPD, not previously on home O2 6. Thrombocytopenia of unclear cause, appears worsened than prior levels  7. Diabetes mellitus uncontrolled A1C 10 on 4/18 8. Pulmonary hypertension with PAP 12mmHg with mod TR by echo 09/2013 9. H/o syncope 03/2013 in setting of hypotension 10. 5 beats SVT - has ICD  Overall clinical picture is concerning to me with NSTEMI, hypoxia, and elevated PA pressures on recent echocardiogram along with history of syncope, although he is presently asymptomatic. Will discuss anticoag & R&LHC with MD.   Signed, Melina Copa PA-C  I have seen and examined the patient along with Dayna Dunn PA-C.  I have reviewed the chart, notes and new data.  I agree with PA's note.  Presentation was unusual (not angina or worsening dyspnea,had asymptomatic hypotension), but labs are consistent with a NSTEMI that occurred 24-48 h before admission. ECG shows worsened ST depression in the lateral leads. Still angina free. BP better.  PLAN: Right and left heart catheterization in AM, possible PCI stent. Suspect evaluation will show a marked reduction in LVEF with a regional pattern. Thrombocytopenia is noted - mild, not prohibitive for cath/PCI. Easy bruising, but no serious bleeding issues. Start heparin IV with CBC monitoring. He  quit smoking this month, but his exam suggests severe emphysema (very diminished breath sounds, "pink puffer" phenotype), although not described as such on CT chest.  Aileena Iglesia, MD, FACC [facility] [phone] 10/19/2013, 2:25 PM  

## 2013-10-20 NOTE — Brief Op Note (Signed)
10/18/2013 - 10/20/2013  11:54 AM  Preliminary Cath/PCI Note  PATIENT:  James Moon  68 y.o. male  PRE-OPERATIVE DIAGNOSIS:  CP/NSTEMI   Full note to follow. R and Left heart cath, grafts and LV gram performed. PCI performed to sequential SVG to OM vessels, with angisculpt/PTCA for 95% in-stent stenosis in mid portion of graft and angiosculpt cutting balloon/3.0x15 Resolute DES stent post dilated to 3.4 mm in proximal portion of vein graft.   Tolerated well.  Troy Sine, MD 10/20/2013 11:59 AM

## 2013-10-20 NOTE — CV Procedure (Signed)
James Moon is a 68 y.o. male    [id number]  [id number] LOCATION:  FACILITY: [facility]  PHYSICIAN: Troy Sine, MD, [facility] Mar 02, 1946   DATE OF PROCEDURE:  10/20/2013    CARDIAC CATHETERIZATION     HISTORY:  [name] is a 68 year old male who underwent CABG surgery in 1996 and a LIMA placed to his LAD, sequential saphenous vein graft to his OM1 and OM2, and vein to his PDA.  He is status post prior PCI and stenting of both vein grafts in 2012.  He has a history of diabetes mellitus, status post ICD implantation, and was admitted to the hospital on 10/18/2013 with significant oxygen desaturation.  He is ruled in for non-ST segment elevation myocardial infarction, and now presents for right and left heart catheterization with possible percutaneous coronary intervention.   PROCEDURE: Right and left heart catheterization via the femoral approach; Swan-Ganz catheterization, cardiac output by the thermodilution and Fick methods, coronary angiography, left ventriculography, and percutaneous coronary intervention involving the vein graft supplying the circumflex system with cutting balloon angioscope and PTCA for high grade mid in-stent restenosis and Cutting Balloon/stenting of the proximal vein graft.  The patient was brought to the second floor [place] [facility]iac cath lab in the postabsorptive state. He was premedicated with Versed 1 mg and fentanyl 25 mcg.  His right groin was prepped in sterile fashion and a 7 [place] venous sheath and 5 French arterial sheath were inserted without difficulty.  Swan-Ganz catheterization was performed and pressures were obtained in the RA, RV, PA, and capillary wedge positions.  Oxygen saturation was obtained in the pulmonary artery.  Thermodilution cardiac output were obtained.  Due to  aortoiliac disease, a Glidewire was necessary to navigate through the distal aorta and a pigtail catheter was advanced to the central aorta.  Simultaneous aortic and  pulmonary pressures were recorded.  O2 saturation was obtained in the central aorta.  The pigtail catheter was passed into the left ventricle.  Simultaneous LV and PC pressures were recorded.  Left ventriculography was performed in the RAO projection.  A careful LV aortic pullback was performed.  The Swan-Ganz catheter was removed.  Attention was then directed at the coronary anatomy and coronary angiography was done utilizing 5 French Judkins 4 left and right catheters.  The right catheter was also used for selective angiography into the vein grafts.  A LIMA catheter was necessary for selective angiography into the left internal mammary artery.  With the demonstration of 95% in-stent restenosis in the midportion of the graft and a new 70% proximal stenosis  in the sequential graft supplying the OM1 and OM 2 vessel, percutaneous coronary intervention  was performed  The 5 French sheath was then upgraded to a 6 [place] arterial sheath.  Angiomax bolus plus infusion was administered.  The patient had been on chronic Plavix therapy and received 150 mg of oral Plavix in the laboratory since he had not received his morning dose.  A filter wire was inserted via a 6 [place] left bypass graft died in catheter but unfortunately the filter wire was unable to cross the second stenosis which was 95% in the midportion of the graft.  There was significant difficulty with catheter.  After multiple attempts at trying to navigate across this mid lesion, the filter wire was then removed and a prowater wire was used for the interventional procedure.  Initially, a 3.0 x10 mm angiosculpt scoring balloon was inserted but this was unable to cross the mid  stenosis.  Lower-level inflations were made just proximal to the stenosis, but despite this, the lesion was unable to be crossed. lesion.  The angiosculpt was then passed to the proximal stenosis.  Several inflations at 3,4 and 5 atmospheres were done at this site.  The cutting balloon was  then removed and a 2.5x12 mm sprinter legend balloon was inserted.  This was able to easily cross the mid stenosis and 2 inflations at 8 and 12 atmospheres were made at this mid site.  The previous angiosculpt 3.0x10 mm catheter was then reinserted and this was now able to adequately treat the in-stent restenotic lesion. A 3.0x15 mm Resolute DES stent was then inserted and deployed at the proximal stenosis.   A 3.5x12 mm Drain Emerge balloon was then used for post stent dilatation with dilatation up to 15 atmospheres at the mid site corresponding to approximately 3.57 mm, and at the proximal stented site up to 10 atmospheres corresponding to 3.41 mm.  Angiography confirmed an excellent result.  The arterial sheath was sutured in place with plans to continue low dose bivalirudin until the current bag was complete, which would be in less than one hour.  The patient left the catheterization laboratory chest pain-free with stable hemodynamics.  HEMODYNAMICS:   RA: 7 RV: 39/8 PA:39/16 Pc: 10  Central aorta: 88/50. Pulmonary artery: 38/16  Left ventricle: 85/11. Central aorta 85/42  Oxygen saturation: Pulmonary artery 60%                                 Central aorta: 91%  Cardiac output: 4.4 by the thermodilution method and 3.7 by the Fick method Cardiac index: 2.4 by the thermodilution and 2.1 by the Fick method    ANGIOGRAPHY:  1. Left main: Normal and bifurcated into an LAD and left circumflex system 2. LAD: Diffuse proximal irregularity and a "flush and fill" phenomena was seen after SP3 due to  LIMA filling.  There is septal collaterals supplying the PDA branch of the RCA 3. Left circumflex: The marginal vessels were not well-visualized antegrade.  He was total occlusion of the distal circumflex after the marginal branch 4. Right coronary artery: Totally occluded in the region of the acute margin was 70 and 80% stenoses in RV marginal branch.  There is evidence for left to right collaterals  to the PDA branch 5.LIMA to LAD: Widely patent.  It anastomosed into the mid LAD.  The LAD beyond the LIMA insertion was free of significant disease and advised to the bifurcating diagonal branch. 6. Sequential SVG to OM1 and OM2 had a previously placed 3.5x38 mm probe.  A stent in its mid segment.  The very proximal portion of the graft prior to the stented segment shortly beyond the ostium was a 75% stenosis.  In the midportion of the graft.  There was a 95% eccentric in-stent restenosis  7. SVG to RCA: Occluded at its origin       Left ventriculography revealed a dilated left ventricle.  There was severe diffuse hypokinesis and a moderate region of dyskinesis at the apex.  The ejection fraction is 20%  IMPRESSION:  Severe ischemic cardiomyopathy with dilated left ventricle and severe diffuse hypokinesis with a moderate region of apical dyskinesis and an ejection fraction of 20%.  Significant native coronary obstructive disease involving the LAD, left circumflex vessel, and right coronary artery.  Patent LIMA to the mid LAD  Occluded vein graft  that supplied the PDA branch of the RCA with evidence of left-to-right collaterals supplying the PDA vessel  Sequential vein graft supplying the OM1 and OM 2 vessels with evidence for new 75% proximal stenosis and a 95% mid in-stent  eccentric restenosis.  Successful percutaneous coronary intervention to the SVG-OM1 (with unsuccessful attempt at filter wire for distal protection) with angiosculpt/3.0x15 mm Resolute DES stenting of the proximal stenosis post dilated to 3.41 mm and the 75% stenosis being reduced to 0% and angiosculpt/NCPTCA of the 95% in-stent restenosis of the mid vessel with a 95% stenosis being reduced to 0%.  Angiomax/Plavix/IC nitroglycerin   Troy Sine, MD, [facility] 10/20/2013 4:04 PM

## 2013-10-20 NOTE — Interval H&P Note (Signed)
Cath Lab Visit (complete for each Cath Lab visit)  Clinical Evaluation Leading to the Procedure:   ACS: yes  Non-ACS:    Anginal Classification: CCS III  Anti-ischemic medical therapy: Minimal Therapy (1 class of medications)  Non-Invasive Test Results: No non-invasive testing performed  Prior CABG: Previous CABG      History and Physical Interval Note:  10/20/2013 8:42 AM  James Moon  has presented today for surgery, with the diagnosis of CP  The various methods of treatment have been discussed with the patient and family. After consideration of risks, benefits and other options for treatment, the patient has consented to  Procedure(s): LEFT AND RIGHT HEART CATHETERIZATION WITH CORONARY/GRAFT ANGIOGRAM (N/A) as a surgical intervention .  The patient's history has been reviewed, patient examined, no change in status, stable for surgery.  I have reviewed the patient's chart and labs.  Questions were answered to the patient's satisfaction.     Troy Sine

## 2013-10-21 ENCOUNTER — Encounter (HOSPITAL_COMMUNITY): Payer: Self-pay | Admitting: Physician Assistant

## 2013-10-21 ENCOUNTER — Telehealth: Payer: Self-pay | Admitting: Cardiovascular Disease

## 2013-10-21 DIAGNOSIS — I214 Non-ST elevation (NSTEMI) myocardial infarction: Secondary | ICD-10-CM

## 2013-10-21 DIAGNOSIS — I5042 Chronic combined systolic (congestive) and diastolic (congestive) heart failure: Secondary | ICD-10-CM

## 2013-10-21 LAB — BASIC METABOLIC PANEL
BUN: 24 mg/dL — ABNORMAL HIGH (ref 6–23)
BUN: 27 mg/dL — ABNORMAL HIGH (ref 6–23)
CO2: 21 meq/L (ref 19–32)
CO2: 29 meq/L (ref 19–32)
Calcium: 9 mg/dL (ref 8.4–10.5)
Calcium: 9.3 mg/dL (ref 8.4–10.5)
Chloride: 92 mEq/L — ABNORMAL LOW (ref 96–112)
Chloride: 94 mEq/L — ABNORMAL LOW (ref 96–112)
Creatinine, Ser: 0.98 mg/dL (ref 0.50–1.35)
Creatinine, Ser: 1.1 mg/dL (ref 0.50–1.35)
GFR calc Af Amer: 78 mL/min — ABNORMAL LOW (ref >90)
GFR calc non Af Amer: 83 mL/min — ABNORMAL LOW (ref >90)
GFR, EST NON AFRICAN AMERICAN: 68 mL/min — AB (ref >90)
GLUCOSE: 218 mg/dL — AB (ref 70–99)
Glucose, Bld: 387 mg/dL — ABNORMAL HIGH (ref 70–99)
POTASSIUM: 5.2 meq/L (ref 3.7–5.3)
Potassium: 5.9 mEq/L — ABNORMAL HIGH (ref 3.7–5.3)
SODIUM: 131 meq/L — AB (ref 137–147)
SODIUM: 136 meq/L — AB (ref 137–147)

## 2013-10-21 LAB — CBC
HCT: 48.2 % (ref 39.0–52.0)
Hemoglobin: 14.3 g/dL (ref 13.0–17.0)
MCH: 26.4 pg (ref 26.0–34.0)
MCHC: 29.7 g/dL — ABNORMAL LOW (ref 30.0–36.0)
MCV: 89.1 fL (ref 78.0–100.0)
PLATELETS: 100 10*3/uL — AB (ref 150–400)
RBC: 5.41 MIL/uL (ref 4.22–5.81)
RDW: 17.4 % — AB (ref 11.5–15.5)
WBC: 5.7 10*3/uL (ref 4.0–10.5)

## 2013-10-21 LAB — GLUCOSE, CAPILLARY
GLUCOSE-CAPILLARY: 292 mg/dL — AB (ref 70–99)
GLUCOSE-CAPILLARY: 221 mg/dL — AB (ref 70–99)
Glucose-Capillary: 341 mg/dL — ABNORMAL HIGH (ref 70–99)
Glucose-Capillary: 185 mg/dL — ABNORMAL HIGH (ref 70–99)

## 2013-10-21 MED ORDER — FUROSEMIDE 40 MG PO TABS
40.0000 mg | ORAL_TABLET | Freq: Every day | ORAL | Status: DC
  Administered 2013-10-21 – 2013-10-22 (×2): 40 mg via ORAL
  Filled 2013-10-21 (×2): qty 1

## 2013-10-21 MED ORDER — CARVEDILOL 3.125 MG PO TABS: 3.1250 mg | ORAL_TABLET | Freq: Two times a day (BID) | ORAL | Status: DC

## 2013-10-21 MED ORDER — METOPROLOL SUCCINATE ER 25 MG PO TB24
25.0000 mg | ORAL_TABLET | Freq: Every day | ORAL | Status: DC
  Administered 2013-10-21 – 2013-10-22 (×2): 25 mg via ORAL
  Filled 2013-10-21 (×2): qty 1

## 2013-10-21 MED FILL — Sodium Chloride IV Soln 0.9%: INTRAVENOUS | Qty: 50 | Status: AC

## 2013-10-21 NOTE — Progress Notes (Signed)
Inpatient Diabetes Program Recommendations  AACE/ADA: New Consensus Statement on Inpatient Glycemic Control (2013)  Target Ranges:  Prepandial:   less than 140 mg/dL      Peak postprandial:   less than 180 mg/dL (1-2 hours)      Critically ill patients:  140 - 180 mg/dL     Results for BALEN, WOOLUM (MRN [id number]) as of 10/21/2013 08:00  Ref. Range 10/20/2013 06:51 10/20/2013 11:27 10/20/2013 17:42 10/20/2013 21:24  Glucose-Capillary Latest Range: 70-99 mg/dL 177 (H) 118 (H) 243 (H) 291 (H)    Results for TIGRAN, HAYNIE (MRN [id number]) as of 10/21/2013 08:00  Ref. Range 10/21/2013 05:20  Glucose Latest Range: 70-99 mg/dL 387 (H)    Lantus 20 units was held yesterday.  No Lantus given to patient yesterday.  As a result, AM lab glucose up to 387 mg/dl today.  If patient will be going home today, please restart home doses of 70/30 insulin.   Will follow Wyn Quaker RN, MSN, CDE Diabetes Coordinator Inpatient Diabetes Program Team Pager: [phone] (8a-10p)

## 2013-10-21 NOTE — Progress Notes (Signed)
SATURATION QUALIFICATIONS: (This note is used to comply with regulatory documentation for home oxygen)  Patient Saturations on Room Air at Rest = 86%  Patient Saturations on Room Air while Ambulating =   Patient Saturations on  Liters of oxygen while Ambulating =  Please briefly explain why patient needs home oxygen: 

## 2013-10-21 NOTE — Care Management Note (Addendum)
  Page 2 of 2   10/22/2013     9:54:53 AM CARE MANAGEMENT NOTE 10/22/2013  Patient:  James Moon, James Moon   Account Number:  [id number]  Date Initiated:  10/21/2013  Documentation initiated by:  James Moon  Subjective/Objective Assessment:   Admitted with Hypotension, Hypoxia, CP     Action/Plan:   CM to follow for disposition needs   Anticipated DC Date:  10/21/2013   Anticipated DC Plan:  [place]  CM consult      [facility] Choice  HOME HEALTH   Choice offered to / List presented to:  C-1 Patient   DME arranged  [place]      DME agency  [place] arranged  HH-1 RN  [place].   Status of service:  Completed, signed off Medicare Important Message given?   (If response is "NO", the following Medicare IM given date fields will be blank) Date Medicare IM given:   Date Additional Medicare IM given:    Discharge Disposition:  [place]  Per UR Regulation:  Reviewed for med. necessity/level of care/duration of stay  If discussed at Long Length of Stay Meetings, dates discussed:    Comments:  PT RECS:  [place] PT (ordered with [facility]/Donna Notified) DME RECS:  Conservation officer, [place] (ordered with [facility]/Jermaine) Disposition Plan:  HHS: RN, PT and home [place] RN, BSN, MSHL, CCM 10/21/2013   CM Consult:  Patient fatigued and sleepy; difficulty providing information to questions. Social:  From home Hx/o Past hx/o of home O2 but currenly has no oxygen in the home per staff nurse, DTR provided this update. Hx/o home  nebulizer machine that was delivered to pt in 2012. O2 sats indicate need for home O2; ordered with [facility]/Jermaine HHS:  RN Disease MGMT and mediction reconciliation and HSE if unable to complete PT prior to d/c home. PT EVAL RECS:  pending Disposition Plan:  Home with HHS and home oxygen. James Colpitts RN, BSN, [place], [place] 10/21/2013

## 2013-10-21 NOTE — Progress Notes (Signed)
Patient: James Moon / Admit Date: 10/18/2013 / Date of Encounter: 10/21/2013, 6:45 AM  Subjective  Feels well. No complaints.  Objective   Telemetry: NSR, yesterday 9 beats NSVT  Physical Exam: Blood pressure 119/64, pulse 44, temperature 98.3 F (36.8 C), temperature source Oral, resp. rate 18, height 5\' 10"  (1.778 m), weight 135 lb 12.9 oz (61.6 kg), SpO2 99.00%. General: Well developed, well nourished WM in no acute distress.  Head: Normocephalic, atraumatic, sclera non-icteric, no xanthomas, nares are without discharge.  Neck: JVP not elevated.  Lungs: Markedly diminished BS throughout. No wheezes, rales, or rhonchi. Breathing is unlabored.  Heart: RRR S1 S2 without murmurs, rubs, or gallops.  Abdomen: Soft, non-tender, non-distended with normoactive bowel sounds. No rebound/guarding.  Extremities: No clubbing or cyanosis. Mildly erythematous bilaterally. Bilateral striated skin stretch markings indicative of prior significant LEE though none is present at this time. R groin cath site without hematoma, bruit Neuro: Alert and oriented X 3. Moves all extremities spontaneously. Hard of hearing.  Psych: Responds to questions appropriately with a normal affect.    Intake/Output Summary (Last 24 hours) at 10/21/13 0645 Last data filed at 10/21/13 0450  Gross per 24 hour  Intake    300 ml  Output   1195 ml  Net   -895 ml    Inpatient Medications:  . aspirin EC  81 mg Oral Daily  . atorvastatin  40 mg Oral q1800  . clopidogrel  75 mg Oral Q breakfast  . insulin aspart  0-5 Units Subcutaneous QHS  . insulin aspart  0-9 Units Subcutaneous TID WC  . insulin aspart  3 Units Subcutaneous TID WC  . insulin glargine  20 Units Subcutaneous Daily  . ipratropium-albuterol  3 mL Inhalation QID  . mometasone-formoterol  2 puff Inhalation BID  . sodium chloride  3 mL Intravenous Q12H   Infusions:  . sodium chloride 50 mL/hr at 10/20/13 2000    Labs:  Recent Labs  10/19/13 0114  10/20/13 0500  NA 139 141  K 4.4 4.6  CL 90* 95*  CO2 37* 36*  GLUCOSE 339* 114*  BUN 40* 27*  CREATININE 1.31 1.03  CALCIUM 9.2 9.4   No results found for this basename: AST, ALT, ALKPHOS, BILITOT, PROT, ALBUMIN,  in the last 72 hours  Recent Labs  10/19/13 0114 10/20/13 0500  WBC 4.6 5.4  HGB 13.8 15.2  HCT 47.2 50.7  MCV 90.1 89.6  PLT 89* PLATELET CLUMPS NOTED ON SMEAR, COUNT APPEARS DECREASED    Recent Labs  10/18/13 2117 10/19/13 0114 10/19/13 0832  TROPONINI 2.31* 3.15* 1.73*    Radiology/Studies:  Dg Chest 2 View  10/19/2013   CLINICAL DATA:  Hypoxemia  EXAM: CHEST  2 VIEW  COMPARISON:  CT ANGIO CHEST W/CM &/OR WO/CM dated 10/15/2013; DG CHEST 2V dated 10/15/2013  FINDINGS: Postoperative changes in the mediastinum. Cardiac pacemaker. Normal heart size and pulmonary vascularity. Emphysematous changes and scattered fibrosis in the lungs. No developing infiltration or edema since previous study. Small bilateral pleural effusions are suggested.  IMPRESSION: Small bilateral pleural effusions. No developing airspace disease or consolidation.   Electronically Signed   By: Lucienne Capers M.D.   On: 10/19/2013 02:01   Dg Chest 2 View  10/15/2013   CLINICAL DATA:  Shortness of breath  EXAM: CHEST  2 VIEW  COMPARISON:  04/21/2013  FINDINGS: A pacing device is again seen and stable. Postsurgical changes are again noted. The cardiac shadow is stable. The lungs are  well aerated bilaterally and mildly hyperinflated. No focal infiltrate or sizable effusion is seen. No acute bony abnormality is noted.  IMPRESSION: No acute abnormality noted.   Electronically Signed   By: Inez Catalina M.D.   On: 10/15/2013 13:36   Ct Angio Chest Pe W/cm &/or Wo Cm  10/15/2013   CLINICAL DATA:  Hypoxemic.  Elevated D-dimer.  EXAM: CT ANGIOGRAPHY CHEST WITH CONTRAST  TECHNIQUE: Multidetector CT imaging of the chest was performed using the standard protocol during bolus administration of intravenous  contrast. Multiplanar CT image reconstructions and MIPs were obtained to evaluate the vascular anatomy.  CONTRAST:  162mL OMNIPAQUE IOHEXOL 350 MG/ML SOLN  COMPARISON:  CT ANGIO CHEST W/CM &/OR WO/CM dated 04/22/2013; DG CHEST 2V dated 10/15/2013  FINDINGS: Technically adequate study with good opacification of the central and segmental pulmonary arteries. No focal filling defects are demonstrated. No evidence of significant pulmonary embolus.  Mild cardiac enlargement. Coronary artery calcifications. Postoperative changes consistent with CABG. Moderately prominent hilar and subcarinal lymph nodes bilaterally are upper limits of normal by size criteria and are probably reactive. Appearance is similar to previous study. Esophagus is decompressed. Diffuse emphysematous changes in the lungs. Small bilateral pleural effusions with basilar atelectasis. Focal tree-in-bud infiltrates in the right lung base may represent inflammatory process/ pneumonia. No pneumothorax. Visualized upper abdominal organs are grossly unremarkable except for vascular calcifications. No destructive bone lesions.  Review of the MIP images confirms the above findings.  IMPRESSION: No evidence of significant pulmonary embolus. Small bilateral pleural effusions with basilar atelectasis. Suggestion of early inflammatory/infectious process in the right base. Mediastinal and hilar lymph nodes are upper limits of normal, likely reactive.   Electronically Signed   By: Lucienne Capers M.D.   On: 10/15/2013 23:57     Assessment and Plan  1. Hypotension currently stable  2. Acute respiratory failure/hypoxia - 70s on RA at PCP, CT angio neg for PE on 4/17  2. Chronic systolic CHF - 67% by cath 10/20/13, (previously 40-45% by echo earlier this month and 30-35% in 2013)  3. CAD s/p CABG 1996, PCI 2012 with NSTEMI this admission, peak troponin 3.15, s/p DES to prox SVG-OM1, NCPTCA of mSVG-OM1 5. COPD, not previously on home O2  6. Thrombocytopenia of  unclear cause, no evidence of bleeding  7. Diabetes mellitus uncontrolled A1C 10 on 4/18  8. Pulmonary hypertension with PAP 18mmHg with mod TR by echo 09/2013  9. H/o syncope 03/2013 in setting of hypotension  10. 5 beats NSVT - has ICD 11. Longstanding tobacco abuse, quit this past week  Continue ASA, Plavix. Monitor for bleeding with thrombocytopenia.  Await AM labs. With NSVT will consider adding back BB. Need to be cautious with CHF meds due to tendency towards hypotension. Will d/w MD.  Will need eval for home O2 needs prior to DC. May also benefit from pulm follow-up given likely component of COPD. Consider outpt heme or PCP f/u for thrombocytopenia which does not appear to have previously been evaluated. Given NSTEMI, should not drive for 1 week, lift over 10 lbs for 2 weeks, or sexual activity for 2 weeks. May shower but no soaking baths/hot tubs/pools for 1 week. I have left a message on our office's scheduling voicemail requesting a follow-up appointment, and our office will call the patient with this appointment.  Signed, Melina Copa PA-C

## 2013-10-21 NOTE — Progress Notes (Signed)
Family Medicine Teaching Service Daily Progress Note Intern Pager: [phone]  Patient name: James Moon Medical record number: [id number] Date of birth: [birth year] Age: 68 y.o. Gender: male  Primary Care Provider: No primary provider on file. Consultants: Cardiology Code Status: full  Pt Overview and Major Events to Date:  4/21: Hypotension & Hypoxia   Assessment and Plan: James Moon is a 68 y.o. male presenting with hypotension and hypoxemia. PMH is significant for HFrEF, COPD, CAD, DM, tobacco use, ischemic cardiomyopathy and h/o colon cancer s/p colectomy.   # CV: CAD/Ischemic cardiomyopathy/HFeRF/hypotension: History of CABG 1996, NSTEMI 06/2011 with multiple stents; last echo on 4/19 and showed EF of 40-45% with LVH and grade I diastolic dysfunction; s/p PCI of proximal stenosis 4/23 for occlusion >95%,  pt currently normotensive but bradycardic this morning -Continue home aspirin, plavix, atorvastatin 40  -management per cardiology recs, appreciate assistance  -daily weights  -restarted metoprolol, lasix this morning -would ideally benefit from low dose ACE but will not start today 2/2 hyperkalemia and BP   # Pulm: Hypoxemia/COPD: O2 sat dropped in PCP office to 70s%.Requiring 4L [place] here  Last admission, patient had not qualified for home O2. Pt not sx (no cough or dyspnea) -DME for home O2 placed: Oxygen stats dropped to ~70s on RA while resting in bed -Continue home Combivent (ordered as [facility] inpatient)  -Oxygen therapy, keep O2 >92%, wean as tolerated  -Chest x-ray: Small bilateral pleural effusions. No developing airspace disease or consolidation.    -Dulera BID (started 4/21)  # Endo: Diabetes mellitus, type 2: last A1c of 10 on 10/16/13. Elevated Cbgs this morning, but did not receive lantus yesterday 2/2 procedure -cont Lantus 20qd + Meal-time coverage 3u + Sensitive SSI -Discontinue Novolog 70/30 (Home doses 20 qam & 25 qpm )  #FEN/GI: carb modified, KVO,  hyperkalemic (expected to improve in light of insulin and lasix this morning) -recheck BMET this afternoon -could consider adding kayex at that time if not improved  Prophylaxis: on ASA, Plavix  Disposition: monitor on restart of BB and lasix; set up home o2  Subjective: No complaint this morning; scheduling appointment with cardiology for f/up  Objective: Temp:  [97.7 F (36.5 C)-98.5 F (36.9 C)] 98.3 F (36.8 C) (04/23 0448) Pulse Rate:  [44-106] 44 (04/23 0448) Resp:  [16-18] 18 (04/23 0448) BP: (101-120)/(55-86) 119/64 mmHg (04/23 0448) SpO2:  [90 %-99 %] 99 % (04/23 0448) Weight:  [135 lb 12.9 oz (61.6 kg)] 135 lb 12.9 oz (61.6 kg) (04/23 0500) Physical Exam: General: Sitting up in bed, pleasant, thin, in no acute distress  HEENT: Left pupil 35mm, right 104mm (pt states chronic issue), EOMI, dry oral mucosa, no dentition  Cardiovascular: Regular rate and rhythm, no murmur, no JVD appreciated, distant. 1+ bilateral DP pulses  Respiratory: Normal WOB, CTAB other than crackles at bases on right more than left, on 2L Extremities: 1+ pitting edema in ankles much improved, TED hose on bilaterally, very thin hands  Skin: Areas of ecchymosis on forearms bilaterally which appear to be from recent IV placements, no cyanosis   Laboratory:  Recent Labs Lab 10/19/13 0114 10/20/13 0500 10/21/13 0520  WBC 4.6 5.4 5.7  HGB 13.8 15.2 14.3  HCT 47.2 50.7 48.2  PLT 89* PLATELET CLUMPS NOTED ON SMEAR, COUNT APPEARS DECREASED 100*    Recent Labs Lab 10/16/13 0706  10/19/13 0114 10/20/13 0500 10/21/13 0520  NA 143  < > 139 141 131*  K 4.6  < > 4.4 4.6  5.9*  CL 95*  < > 90* 95* 92*  CO2 36*  < > 37* 36* 21  BUN 17  < > 40* 27* 24*  CREATININE 1.06  < > 1.31 1.03 0.98  CALCIUM 9.3  < > 9.2 9.4 9.0  PROT 6.8  --   --   --   --   BILITOT 0.9  --   --   --   --   ALKPHOS 65  --   --   --   --   ALT 12  --   --   --   --   AST 17  --   --   --   --   GLUCOSE 182*  < > 339* 114* 387*   < > = values in this interval not displayed.    Recent Labs Lab 10/15/13 2348 10/16/13 0706 10/18/13 2117 10/19/13 0114 10/19/13 0832  TROPONINI <0.30 <0.30 2.31* 3.15* 1.73*    Imaging/Diagnostic Tests: Dg Chest 2 View  10/19/2013   CLINICAL DATA:  Hypoxemia  EXAM: CHEST  2 VIEW  COMPARISON:  CT ANGIO CHEST W/CM &/OR WO/CM dated 10/15/2013; DG CHEST 2V dated 10/15/2013  FINDINGS: Postoperative changes in the mediastinum. Cardiac pacemaker. Normal heart size and pulmonary vascularity. Emphysematous changes and scattered fibrosis in the lungs. No developing infiltration or edema since previous study. Small bilateral pleural effusions are suggested.  IMPRESSION: Small bilateral pleural effusions. No developing airspace disease or consolidation.     Cardiac cath 10/20/13 Severe ischemic cardiomyopathy with dilated left ventricle and severe diffuse hypokinesis with a moderate region of apical dyskinesis and an ejection fraction of 20%.  Significant native coronary obstructive disease involving the LAD, left circumflex vessel, and right coronary artery.  Patent LIMA to the mid LAD  Occluded vein graft that supplied the PDA branch of the RCA with evidence of left-to-right collaterals supplying the PDA vessel  Sequential vein graft supplying the OM1 and OM 2 vessels with evidence for new 75% proximal stenosis and a 95% mid in-stent eccentric restenosis.  Successful percutaneous coronary intervention to the SVG-OM1 (with unsuccessful attempt at filter wire for distal protection) with angiosculpt/3.0x15 mm Resolute DES stenting of the proximal stenosis post dilated to 3.41 mm and the 75% stenosis being reduced to 0% and angiosculpt/NCPTCA of the 95% in-stent restenosis of the mid vessel with a 95% stenosis being reduced to 0%  Langston Masker, MD 10/21/2013, 6:57 AM PGY-1, [place] Intern pager: [phone], text pages welcome

## 2013-10-21 NOTE — Progress Notes (Addendum)
CARDIAC REHAB PHASE I   PRE:  Rate/Rhythm: 89 Pacing  BP:  Supine: 118/59  Sitting:   Standing:    SaO2: 100 4L 94 2L 62 RA 87 2L 90 4L   MODE:  Ambulation: 400 ft   POST:  Rate/Rhythm: 110  BP:  Supine:   Sitting: 112/57  Standing:    SaO2: 85 4L 90 6L 0800-0930 On arrival pt in bed on O2 4L sat 100, decreased O2 to 2L sat 94% with pt still in bed. Pt ask to go to bathroom went without O2 sat on return from bathroom 62% on room air. O2 reapplied 2L sat 87% increased to 4L sat 90%. Assisted X 1 to ambulate. Gait steady pt c/o of some weakness from being in bed and wanted to hold to IV pole. In hallway O2 sat on 4L after 200 feet 85%. O2 increased to 6L. On return to room sat on 6L 90%. Pt states that he has been on O2 at home but it was discontinued by his pulmonologist. Started MI, stent and CHF education with pt. He is not very attentive or receptive to information. Pt to recliner after walk with call light in reach. Will try to follow pt to do more education and monitor sats.   Rodney Langton RN 10/21/2013 9:23 AM

## 2013-10-21 NOTE — Progress Notes (Signed)
I have seen and evaluated the patient this AM along with DAYNA DUNN, PA-C.   I agree with her findings, examination as well as impression recommendations.  Stable post PCI of SVG-OM.  However, he remains hypoxic with any activity. RHC values reviewed -- unfortunately, was quite hypotensive during the case & thus the RHC values may be underestimated. Regardless, with RAP/RVEDP of ~7-8 mmHg, he is relatively Euovolemic.  PCWP of 10 and only mild-moderately elevated PA pressures would argue against a CHF cause for hypoxia.  No rales on exam, but simply minimal breath sounds period.    I suspect his hypoxia is related to chronic pulmonary disease with very low reserve.  He will most certainly need home O2.Marland Kitchen  HyperKalemic today -- needs adressing - consider simply rechecking after his insulin dose & breathing Rx.  For his Ischemic CM, we must start to re-institute his cardiac medications: would switch to B1 selective BB (Toprol 25 mg -- not Lopressor) & restart standing home dose of Lasix - 40 mg po BID.  Based upon RHC values, we are essentially @ dry weight for him.  Would recommend a Sliding Scale Lasix dosing regimen. Will likely need afterload reduction (ARB vs. Hydral/nitrate), but in light of relative hypotension, would hold for now.   Sliding scale Lasix: Pt Weighs himself when he gets home, then Daily in the Morning. His dry weight will be what thescale says on the day he return home.  If you gain more than 3 pounds from dry weight: Increase the Lasix dosing to 80 mg in the morning and 40 mg in the afternoon until weight returns to baseline dry weight.  If weight gain is greater than 5 pounds in 2 days: Increased to Lasix 80 mg twice a day and contact the office for further assistance if weight does not go down the next day.  If the weight goes down more than 3 pounds from dry weight: Hold Lasix until it returns to baseline dry weight   Would consider keeping for at least a day or so to  see how he tolerates being back on BB & Lasix.  Will need Home O2 set up & consider [facility] RN care.  Leonie Man, M.D., M.S. Interventional Cardiologist  [place] Pager # [phone] 10/21/2013

## 2013-10-21 NOTE — Progress Notes (Signed)
pulm SATURATION QUALIFICATIONS: (This note is used to comply with regulatory documentation for home oxygen)  Patient Saturations on Room Air at Rest = %  Patient Saturations on Room Air while Ambulating = 62%  Patient Saturations on 6 Liters of oxygen while Ambulating = 90%  Please briefly explain why patient needs home oxygen: After walking to bathroom sat 62%. Took 6L walking to keep sat 90%.

## 2013-10-21 NOTE — Progress Notes (Signed)
Seen and examined.  Discussed with Dr. Marsh.  Agree with her management and documentation. 

## 2013-10-21 NOTE — Telephone Encounter (Signed)
New Message:  Per after hours VM, 1 wk TOC w/ Nahser/PA. Pt is scheduled to see Scott on 4/30 at 12:10pm.

## 2013-10-22 LAB — GLUCOSE, CAPILLARY
Glucose-Capillary: 262 mg/dL — ABNORMAL HIGH (ref 70–99)
Glucose-Capillary: 295 mg/dL — ABNORMAL HIGH (ref 70–99)

## 2013-10-22 MED ORDER — INSULIN REGULAR HUMAN 100 UNIT/ML IJ SOLN: 3.0000 [IU] | Freq: Three times a day (TID) | INTRAMUSCULAR | Status: DC

## 2013-10-22 MED ORDER — INSULIN GLARGINE 100 UNIT/ML SOLOSTAR PEN: 20.0000 [IU] | PEN_INJECTOR | Freq: Every day | SUBCUTANEOUS | Status: DC

## 2013-10-22 MED ORDER — FUROSEMIDE 40 MG PO TABS: 40.0000 mg | ORAL_TABLET | Freq: Two times a day (BID) | ORAL | Status: DC

## 2013-10-22 MED ORDER — "NEEDLE (DISP) 22G X 3/4"" MISC": 1.0000 | Freq: Every day | Status: DC

## 2013-10-22 MED ORDER — BUDESONIDE-FORMOTEROL FUMARATE 160-4.5 MCG/ACT IN AERO: 2.0000 | INHALATION_SPRAY | Freq: Two times a day (BID) | RESPIRATORY_TRACT | Status: DC

## 2013-10-22 MED ORDER — INSULIN SYRINGES (DISPOSABLE) U-100 0.3 ML MISC: 1.0000 | Freq: Three times a day (TID) | Status: DC

## 2013-10-22 MED ORDER — METOPROLOL SUCCINATE ER 25 MG PO TB24: 25.0000 mg | ORAL_TABLET | Freq: Every day | ORAL | Status: DC

## 2013-10-22 NOTE — Progress Notes (Signed)
Subjective: No Orthopnea, PND, CP  Objective: Vital signs in last 24 hours: Temp:  [97.9 F (36.6 C)-98.8 F (37.1 C)] 98.8 F (37.1 C) (04/24 0458) Pulse Rate:  [85-89] 88 (04/24 0806) Resp:  [17-18] 17 (04/24 0458) BP: (94-109)/(51-62) 94/57 mmHg (04/24 0458) SpO2:  [89 %-100 %] 94 % (04/24 0806) Weight:  [135 lb 12.9 oz (61.6 kg)] 135 lb 12.9 oz (61.6 kg) (04/24 0500) Last BM Date: 10/21/13  Intake/Output from previous day: 04/23 0701 - 04/24 0700 In: 800 [P.O.:800] Out: 1425 [Urine:1425] Intake/Output this shift:    Medications Current Facility-Administered Medications  Medication Dose Route Frequency Provider Last Rate Last Dose  . 0.9 %  sodium chloride infusion   Intravenous Continuous Troy Sine, MD 50 mL/hr at 10/21/13 0600    . acetaminophen (TYLENOL) tablet 650 mg  650 mg Oral Q6H PRN Cordelia Poche, MD       Or  . acetaminophen (TYLENOL) suppository 650 mg  650 mg Rectal Q6H PRN Cordelia Poche, MD      . aspirin EC tablet 81 mg  81 mg Oral Daily Troy Sine, MD   81 mg at 10/21/13 9702  . atorvastatin (LIPITOR) tablet 40 mg  40 mg Oral q1800 Cordelia Poche, MD   40 mg at 10/20/13 1805  . clopidogrel (PLAVIX) tablet 75 mg  75 mg Oral Q breakfast Cordelia Poche, MD   75 mg at 10/22/13 0636  . furosemide (LASIX) tablet 40 mg  40 mg Oral Daily Dayna N Dunn, PA-C   40 mg at 10/21/13 6378  . insulin aspart (novoLOG) injection 0-5 Units  0-5 Units Subcutaneous QHS Phill Myron, MD   3 Units at 10/20/13 2139  . insulin aspart (novoLOG) injection 0-9 Units  0-9 Units Subcutaneous TID WC Phill Myron, MD   5 Units at 10/21/13 1355  . insulin aspart (novoLOG) injection 3 Units  3 Units Subcutaneous TID WC Phill Myron, MD   3 Units at 10/21/13 1435  . insulin glargine (LANTUS) injection 20 Units  20 Units Subcutaneous Daily Phill Myron, MD   20 Units at 10/21/13 [phone]  . ipratropium-albuterol (DUONEB) 0.5-2.5 (3) MG/3ML nebulizer solution 3 mL  3 mL Inhalation QID Lupita Dawn, MD   3 mL at 10/21/13 2000  . metoprolol succinate (TOPROL-XL) 24 hr tablet 25 mg  25 mg Oral Daily Dayna N Dunn, PA-C   25 mg at 10/21/13 0277  . mometasone-formoterol (DULERA) 200-5 MCG/ACT inhaler 2 puff  2 puff Inhalation BID Phill Myron, MD   2 puff at 10/21/13 2000  . sodium chloride 0.9 % injection 3 mL  3 mL Intravenous Q12H Cordelia Poche, MD   3 mL at 10/21/13 0930    PE: General appearance: alert, cooperative and no distress Lungs: BS decreased throughout.  no wheezing Heart: regular rate and rhythm, S1, S2 normal, no murmur, click, rub or gallop Extremities: No LEE Pulses: 2+ radials Skin: Warm and dry Neurologic: Grossly normal  Lab Results:   Recent Labs  10/20/13 0500 10/21/13 0520  WBC 5.4 5.7  HGB 15.2 14.3  HCT 50.7 48.2  PLT PLATELET CLUMPS NOTED ON SMEAR, COUNT APPEARS DECREASED 100*   BMET  Recent Labs  10/20/13 0500 10/21/13 0520 10/21/13 1655  NA 141 131* 136*  K 4.6 5.9* 5.2  CL 95* 92* 94*  CO2 36* 21 29  GLUCOSE 114* 387* 218*  BUN 27* 24* 27*  CREATININE 1.03 0.98 1.10  CALCIUM 9.4 9.0 9.3  PT/INR  Recent Labs  10/19/13 1546  LABPROT 13.0  INR 1.00    Assessment/Plan  Active Problems:   CAD (coronary artery disease)   COPD GOLD III   Ischemic cardiomyopathy   Implantable defibrillator-[employer]   Hypoxemia   Hypotension   Chronic combined systolic and diastolic CHF (congestive heart failure)   NSTEMI (non-ST elevated myocardial infarction)  Plan:   SP successful percutaneous coronary intervention to the SVG-OM1 (with unsuccessful attempt at filter wire for distal protection) with angiosculpt/3.0x15 mm Resolute DES stenting of the proximal stenosis post dilated to 3.41 mm and the 75% stenosis being reduced to 0% and angiosculpt/NCPTCA of the 95% in-stent restenosis of the mid vessel with a 95% stenosis being reduced to 0%.   See Dr. Allison Quarry SS lasix instructions.  The patient seems to be tolerating the lopressor.  BP  94/57 to 109/62.  No NSVT overnight.  No ton ACE or ARB due to hypotension.  Home O2 arranged.  Follow up appt arranged.      LOS: 4 days    Tarri Fuller PA-C 10/22/2013 8:20 AM  I have seen and examined the patient along with Tarri Fuller PA-C.  I have reviewed the chart, notes and new data.  I agree with PA/NP's note.  PLAN:  No angina, dyspnea at rest and no arrhythmia. His chronic problems are many and severe, but the acute issues seem reasonably well compensated. No much room to adjust HF meds any further due to low BP and pulmonary issues.  Sanda Klein, MD, [place] [phone] 10/22/2013, 10:24 AM

## 2013-10-22 NOTE — Discharge Summary (Signed)
Seen and examined.  Agree with Dr. Milagros Reap DC, management and documentation.  I am cautiously optimistic that revascularization by stenting will make a significant difference in his clinical course.

## 2013-10-22 NOTE — Progress Notes (Addendum)
Seen and examined.  Agree with Dr. Berkley Harvey.  See my co-sign of the DC summary.  Agree with DC to home.

## 2013-10-22 NOTE — Progress Notes (Signed)
Family Medicine Teaching Service Daily Progress Note Intern Pager: [phone]  Patient name: James Moon Medical record number: [id number] Date of birth: 12-08-1945 Age: 68 y.o. Gender: male  Primary Care Provider: No primary provider on file. Consultants: Cardiology Code Status: full  Pt Overview and Major Events to Date:  4/21: Hypotension & Hypoxia   Assessment and Plan: James Moon is a 68 y.o. male presenting with hypotension and hypoxemia. PMH is significant for HFrEF, COPD, CAD, DM, tobacco use, ischemic cardiomyopathy and h/o colon cancer s/p colectomy.   # CV: CAD/Ischemic cardiomyopathy/HFeRF/hypotension: History of CABG 1996, NSTEMI 06/2011 with multiple stents; last echo on 4/19 and showed EF of 40-45% with LVH and grade I diastolic dysfunction; s/p PCI of proximal stenosis 4/23 for occlusion >95%,  pt currently normotensive but bradycardic this morning -Continue home aspirin, plavix, atorvastatin 40  -management per cardiology recs, appreciate assistance  -daily weights  -switched to Toprol XL and Sliding scale Lasix   -would ideally benefit from low dose ACE but will not start today 2/2 BP   # Pulm: Hypoxemia/COPD: O2 sat dropped in PCP office to 70s%.Requiring 4L  here  Last admission, patient had not qualified for home O2. Pt not sx (no cough or dyspnea) -DME for home O2 placed: Oxygen stats dropped to ~70s on RA while resting in bed -Continue home Combivent (ordered as [facility] inpatient)  -Oxygen therapy, keep O2 >92%, wean as tolerated  -Chest x-ray: Small bilateral pleural effusions. No developing airspace disease or consolidation.    -Dulera BID (started 4/21)  # Endo: Diabetes mellitus, type 2: last A1c of 10 on 10/16/13. -cont Lantus 20qd + Meal-time coverage 3u + Sensitive SSI -Discontinued Novolog 70/30 (Home doses 20 qam & 25 qpm )  #FEN/GI: carb modified, KVO,  Prophylaxis: on ASA, Plavix  Disposition: ready for discharge  Subjective: No  complaint this morning;ready to go home. Denies CP or SOB.   Objective: Temp:  [97.9 F (36.6 C)-98.8 F (37.1 C)] 98.6 F (37 C) (04/24 0749) Pulse Rate:  [85-89] 88 (04/24 0806) Resp:  [17-18] 18 (04/24 0749) BP: (94-109)/(51-62) 101/62 mmHg (04/24 0749) SpO2:  [74 %-100 %] 91 % (04/24 0832) Weight:  [135 lb 12.9 oz (61.6 kg)] 135 lb 12.9 oz (61.6 kg) (04/24 0500) Physical Exam: General: Sitting up in bed, pleasant, thin, in no acute distress  HEENT: Left pupil 84mm, right 4mm (pt states chronic issue), EOMI, dry oral mucosa, no dentition  Cardiovascular: Regular rate and rhythm, no murmur, no JVD appreciated, distant. 1+ bilateral DP pulses  Respiratory: Normal WOB, CTAB other than crackles at bases on right more than left, on 2L Extremities: 1+ pitting edema in ankles much improved, TED hose on bilaterally, very thin hands  Skin: Areas of ecchymosis on forearms bilaterally which appear to be from recent IV placements, no cyanosis   Laboratory:  Recent Labs Lab 10/19/13 0114 10/20/13 0500 10/21/13 0520  WBC 4.6 5.4 5.7  HGB 13.8 15.2 14.3  HCT 47.2 50.7 48.2  PLT 89* PLATELET CLUMPS NOTED ON SMEAR, COUNT APPEARS DECREASED 100*    Recent Labs Lab 10/16/13 0706  10/20/13 0500 10/21/13 0520 10/21/13 1655  NA 143  < > 141 131* 136*  K 4.6  < > 4.6 5.9* 5.2  CL 95*  < > 95* 92* 94*  CO2 36*  < > 36* 21 29  BUN 17  < > 27* 24* 27*  CREATININE 1.06  < > 1.03 0.98 1.10  CALCIUM 9.3  < >  9.4 9.0 9.3  PROT 6.8  --   --   --   --   BILITOT 0.9  --   --   --   --   ALKPHOS 65  --   --   --   --   ALT 12  --   --   --   --   AST 17  --   --   --   --   GLUCOSE 182*  < > 114* 387* 218*  < > = values in this interval not displayed.    Recent Labs Lab 10/15/13 2348 10/16/13 0706 10/18/13 2117 10/19/13 0114 10/19/13 0832  TROPONINI <0.30 <0.30 2.31* 3.15* 1.73*    Imaging/Diagnostic Tests: Dg Chest 2 View  10/19/2013   CLINICAL DATA:  Hypoxemia  EXAM: CHEST  2 VIEW   COMPARISON:  CT ANGIO CHEST W/CM &/OR WO/CM dated 10/15/2013; DG CHEST 2V dated 10/15/2013  FINDINGS: Postoperative changes in the mediastinum. Cardiac pacemaker. Normal heart size and pulmonary vascularity. Emphysematous changes and scattered fibrosis in the lungs. No developing infiltration or edema since previous study. Small bilateral pleural effusions are suggested.  IMPRESSION: Small bilateral pleural effusions. No developing airspace disease or consolidation.     Cardiac cath 10/20/13 Severe ischemic cardiomyopathy with dilated left ventricle and severe diffuse hypokinesis with a moderate region of apical dyskinesis and an ejection fraction of 20%.  Significant native coronary obstructive disease involving the LAD, left circumflex vessel, and right coronary artery.  Patent LIMA to the mid LAD  Occluded vein graft that supplied the PDA branch of the RCA with evidence of left-to-right collaterals supplying the PDA vessel  Sequential vein graft supplying the OM1 and OM 2 vessels with evidence for new 75% proximal stenosis and a 95% mid in-stent eccentric restenosis.  Successful percutaneous coronary intervention to the SVG-OM1 (with unsuccessful attempt at filter wire for distal protection) with angiosculpt/3.0x15 mm Resolute DES stenting of the proximal stenosis post dilated to 3.41 mm and the 75% stenosis being reduced to 0% and angiosculpt/NCPTCA of the 95% in-stent restenosis of the mid vessel with a 95% stenosis being reduced to 0%  Phill Myron, MD 10/22/2013, 9:36 AM PGY-1, [place] Intern pager: [phone], text pages welcome

## 2013-10-22 NOTE — Progress Notes (Signed)
[phone] Pt just walked with PT on oxygen so did not walk. Pt wants to go back to work at [employer] and can not wear oxygen and work. Discussed with pt that he has to be on oxygen now when he begins walking because sats so low. Pt receptive to ed today. Discussed watching carbs and better food choices. Discussed need for plavix, NTG use, when to call MD with weight gain, modified ex ed, and Phase 2. Declined Phase 2. Discussed smoking cessation and pt to quit cold [place]. Graylon Good RN BSN 10/22/2013 9:15 AM

## 2013-10-22 NOTE — Evaluation (Addendum)
Physical Therapy Evaluation Patient Details Name: James Moon MRN: [id number] DOB: 1946/02/10 Today's Date: 10/22/2013   History of Present Illness  Pt. admitted with hypotension/hypoxia and ruled in for nstemi.  Pt. also with COPD, non O2 dependent PTA,  h/o implantable defibrillator, chronic combined systolic and diastolic CHF.  Cardiac cath 4/22 with severe cardiomyopathy and EF 20%..  Pt underwent percutaneous coronary intervention with SVG OM1  Clinical Impression  This patient has decreased tolerance to activity/gait, decreased stability in  gait and now needs O2 to manage his sats (sats still in upper 70s on 3L --see vitals tab).  He will benefit from ongoing PT in the home environment to advance his activity tolerance until he is ready for OP services/cardiac rehab.  He should use a RW at this time to stabilize his gait but he says he wants to think about it.  Pt. And his RN anticipates DC  home so did not set acute goals, but recommend HHPT follow up.  I discussed with him that he will need to take several rests as he negotiates his flight of steps to get up to his 2nd floor apartment.      Follow Up Recommendations Home health PT    Equipment Recommendations  Rolling walker with 5" wheels    Recommendations for Other Services       Precautions / Restrictions Precautions Precautions: Fall (fell in parking lot 6 months ago 2/2 dizziness) Restrictions Weight Bearing Restrictions: No      Mobility  Bed Mobility Overal bed mobility: Independent             General bed mobility comments: managed  on his own with no use of bedrail  Transfers Overall transfer level: Modified independent Equipment used: None             General transfer comment: no overt LOB to rise to stand, managing at mod I level  Ambulation/Gait Ambulation/Gait assistance: Min guard;Supervision Ambulation Distance (Feet): 100 Feet (100 x 2 trials with seated rest) Assistive device:  None;Rolling walker (2 wheeled) Gait Pattern/deviations: Step-through pattern;Staggering right;Drifts right/left;Narrow base of support   Gait velocity interpretation: Below normal speed for age/gender General Gait Details: Pt. with mild sway and one episode of stagger toward R side with out use of RW.  He says this is his usual gait pattern.  With use of RW, he was much more stable with no overt LOB noted.  Pt. does not appear pleased at the prospect of using RW., thought he does not deny that he will consider its use.    Stairs            Wheelchair Mobility    Modified Rankin (Stroke Patients Only)       Balance Overall balance assessment: Needs assistance   Sitting balance-Leahy Scale: Good Sitting balance - Comments: when pt. sate back down on bed  after walk, he swayed posteriorly but was able to regain balance quickly     Standing balance-Leahy Scale: Good               High level balance activites: Turns High Level Balance Comments: decreased stability with turns without use of RW, needing min assist to correct             Pertinent Vitals/Pain See vitals tab Pt. With significantly  decreasing sats with activity, though he denied feeling SOB and did not appear dyspnic.    Home Living Family/patient expects to be discharged to:: Private residence Living  Arrangements: Alone Available Help at Discharge: Family;Available PRN/intermittently Type of Home: Apartment Home Access: Stairs to enter Entrance Stairs-Rails: Psychiatric nurse of Steps: 10 Home Layout: One level Home Equipment: None      Prior Function Level of Independence: Independent         Comments: drives,      Hand Dominance        Extremity/Trunk Assessment   Upper Extremity Assessment: Overall WFL for tasks assessed           Lower Extremity Assessment: Generalized weakness         Communication   Communication: HOH  Cognition Arousal/Alertness:  Awake/alert Behavior During Therapy: WFL for tasks assessed/performed Overall Cognitive Status: Within Functional Limits for tasks assessed                      General Comments      Exercises        Assessment/Plan    PT Assessment All further PT needs can be met in the next venue of care  PT Diagnosis     PT Problem List Decreased activity tolerance;Decreased balance;Decreased knowledge of use of DME;Decreased knowledge of precautions;Cardiopulmonary status limiting activity  PT Treatment Interventions     PT Goals (Current goals can be found in the Care Plan section)      Frequency     Barriers to discharge Inaccessible home environment pt. has a flight of steps up to his 2nd level apartment but should be able to negotiate with multiple standing rests; pt. aware    Co-evaluation               End of Session Equipment Utilized During Treatment: Gait belt;Oxygen Activity Tolerance: Patient limited by fatigue;Treatment limited secondary to medical complications (Comment) (decreased O2 sats) Patient left: in bed;with call bell/phone within reach Nurse Communication: Mobility status;Other (comment) (drop in O2 sats)         Time: 9233-0076 PT Time Calculation (min): 34 min   Charges:   PT Evaluation $Initial PT Evaluation Tier I: 1 Procedure PT Treatments $Gait Training: 23-37 mins   PT G CodesLadona Ridgel 10/22/2013, 9:30 AM Gerlean Ren PT Acute Rehab Services [phone] [place] [phone]

## 2013-10-22 NOTE — Discharge Instructions (Signed)
Mr Portnoy you were hospitalized for low blood pressure and low oxygen saturations. Cardiology found a blockage in one of the arteries in your heart and placed a stent to open it; It is very important to take both aspirin and Plavix everyday. Afterwards your blood pressure medications were charged. Your Coreg was stopped and Toprol started. You lasix dose was also changed as described below. I'm glad you have decided to quit smoking. To improve your breathing you will need to use oxygen at all times. You were also started on Dulera to help improve your breathing.  Your insulin was changed as well: You should take Lantus every morning; You should check you blood sugar every morning when you wake up and before every meal; You should take Humulin Insulin (3 units) before every meal. Your should make an appointment with your primary care doctor early next week to discuss these changes and continue to make adjustments.   Sliding scale Lasix: Weigh yourself when you get home, then Daily in the Morning.  Your dry weight will be what the scale says on the day you return home.  If you gain more than 3 pounds from dry weight: Increase the Lasix dosing to 80 mg in the morning and 40 mg in the afternoon until weight returns to baseline dry weight.  If weight gain is greater than 5 pounds in 2 days: Increased to Lasix 80 mg twice a day and contact the office for further assistance if weight does not go down the next day.  If the weight goes down more than 3 pounds from dry weight: Hold Lasix until it returns to baseline dry weight

## 2013-10-25 ENCOUNTER — Telehealth: Payer: Self-pay | Admitting: Cardiovascular Disease

## 2013-10-25 NOTE — Telephone Encounter (Signed)
New message     Is the patient supposed to be taking coreg?

## 2013-10-25 NOTE — Telephone Encounter (Signed)
lmtcb

## 2013-10-25 NOTE — Telephone Encounter (Signed)
Patient contacted regarding discharge [facility] on 10/22/13.   Patient understands to follow up with provider ([place]) at 12:10 with Richardson Dopp at [facility].  Patient understands discharge instructions? (REVIEWED WITH HOME CARE NURSE)---patient was still taking COREG, and he took 20 UNITS of REGULAR INSULINE BEFORE EACH MEAL INSTEAD OF 3 UNITS.  WHEN NURSE ARRIVED HE HAD BEEN PERSPIRING, CHECKED HIS SUGAR.  IT WAS 45.---DRANK JUICE AND IS GOING TO EAT.  ALSO, RE: LASIX SLIDING SCALE, HE WEIGHED HIMSELF ON SUN.  SO THEY WILL USE THIS AS DRY WEIGHT (133LB).  Patient understands medications and regiment? No --being reinforced by home care nurse, but she has concerns, especially with following sliding scale lasix regimen. Patient understands to bring all medications to this visit? Yes Daughter will make f/u appointment with Dr. Edilia Bo, family practice, for one day this week.

## 2013-10-25 NOTE — Telephone Encounter (Signed)
See encounter 4/27 with [facility] nurse.

## 2013-10-26 ENCOUNTER — Encounter: Payer: Self-pay | Admitting: Family Medicine

## 2013-10-27 ENCOUNTER — Ambulatory Visit (INDEPENDENT_AMBULATORY_CARE_PROVIDER_SITE_OTHER): Payer: Medicare Other | Admitting: Family Medicine

## 2013-10-27 VITALS — BP 99/52 | HR 92 | Temp 98.4°F | Resp 18 | Wt 142.0 lb

## 2013-10-27 DIAGNOSIS — I509 Heart failure, unspecified: Secondary | ICD-10-CM

## 2013-10-27 DIAGNOSIS — IMO0001 Reserved for inherently not codable concepts without codable children: Secondary | ICD-10-CM

## 2013-10-27 DIAGNOSIS — R609 Edema, unspecified: Secondary | ICD-10-CM

## 2013-10-27 DIAGNOSIS — R600 Localized edema: Secondary | ICD-10-CM

## 2013-10-27 DIAGNOSIS — E119 Type 2 diabetes mellitus without complications: Secondary | ICD-10-CM

## 2013-10-27 DIAGNOSIS — Z794 Long term (current) use of insulin: Secondary | ICD-10-CM

## 2013-10-27 LAB — BASIC METABOLIC PANEL
BUN: 24 mg/dL — AB (ref 6–23)
CHLORIDE: 97 meq/L (ref 96–112)
CO2: 33 mEq/L — ABNORMAL HIGH (ref 19–32)
CREATININE: 1.05 mg/dL (ref 0.50–1.35)
Calcium: 8.8 mg/dL (ref 8.4–10.5)
Glucose, Bld: 258 mg/dL — ABNORMAL HIGH (ref 70–99)
Potassium: 4.4 mEq/L (ref 3.5–5.3)
Sodium: 139 mEq/L (ref 135–145)

## 2013-10-27 NOTE — Patient Instructions (Signed)
We are going to have you adjust your lasix (forosemide) depending on your weight.  This is because when you gain weight, we know you are holding on to too much fluid.  The lasix helps you get rid of fluid (diuretic).    Your weight is expected to be around 136 lbs.  As long as you are at this weight continue to take your lasix 40 mg, one or two a day.  If taking just 1 keeps your weight at 136 than this is fine.  If you go up more than 3 pounds, take lasix 80 mg in the morning and 40 mg in the afternoon until your weight is back to normal.   If you gain more than 5 pounds take lasix 80 twice a day and call us or your heart doctor. If your weight goes down below 133 stop the lasix until your weight is back up,.  Please come and see me in about 3 weeks to discuss your blood sugar.  Continue to check your sugars in the morning and write them down.    Let me know if you have any problems in the meantime!

## 2013-10-27 NOTE — Progress Notes (Signed)
[facility] [address], [place] [address] [phone]  Date:  10/27/2013   Name:  James Moon   DOB:  03/15/1946   MRN:  [id number]  PCP:  No primary provider on file.    Chief Complaint: Follow-up   History of Present Illness:  James Moon is a 68 y.o. very pleasant male patient who presents with the following:  Here today for a hospital follow-up.  He was admitted earlier this month due to hypoxemia and likely CHF.  He was then readmitted from 4/20 to 4/24.  He was noted to have a likely NSTEMI with elevated enzymes and was taken to the cath lab- he had two stents placed.  He was discharged home with home O2.  He is also on insulin for his DM. He is taking 3 units with meals and 20 units of lantus dialy.      He was asked to adjust his lasix based on his weight.  He does not quite understand this.   He is here today feeling well.  "I feel a lot better.'  He has not smoked in about 3 weeks, notes more energy.   He is using his oxygen at home and at night- however right now he does not have it with him and feels ok.   His breathing feels good.  "I can take deep breaths now.'   At home yesterday he weighted 135.4, than this am he weighed 136.6 at home.    Wt Readings from Last 3 Encounters:  10/27/13 142 lb (64.411 kg)  10/22/13 135 lb 12.9 oz (61.6 kg)  10/22/13 135 lb 12.9 oz (61.6 kg)   BP Readings from Last 3 Encounters:  10/27/13 99/52  10/22/13 101/62  10/22/13 101/62   He does note that his glucose is running a little bit higher than he would like.  His am glucose was 292  He is taking lasix 1 pill a day.  He did not take any medication yet today except for insulin.    Patient Active Problem List   Diagnosis Date Noted  . NSTEMI (non-ST elevated myocardial infarction) 10/21/2013  . Chronic combined systolic and diastolic CHF (congestive heart failure) 10/19/2013  . Hypotension 10/18/2013  . Hypoxemia 10/15/2013  . CHF  exacerbation 10/15/2013  . Smoker 05/12/2013  . Acute on chronic systolic CHF (congestive heart failure) 04/22/2013  . Chronic respiratory failure 04/22/2013  . Edema, peripheral 12/09/2012  . 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 Dyspnea 06/26/2011  . COPD GOLD III 06/13/2011  . CAD (coronary artery disease) 06/07/2011  . Chronic systolic heart failure 71/69/6789  . IDDM (insulin dependent diabetes mellitus) 06/07/2011  . Ischemic cardiomyopathy 06/01/2011    Past Medical History  Diagnosis Date  . Diabetes mellitus   . Colon cancer     s/p colectomy   . Hypertension   . CAD (coronary artery disease)     a. CABG in 1996. b. NSTEMI 06/2011: DES x 2 to SVG to 1st and 2nd OM and SVG to PDA. c. 09/2013: NSTEMI s/p DES to prox SVG-OM1, NCPTCA of mSVG-OM1.  . Ischemic cardiomyopathy Dec. 2012    a. EF 20 to 25% 2012. b. 30-35% echo 3/13. c. EF 40-45% by echo 09/2013 but then at time of NSTEMI was 20% by cath later that month.  . Tobacco abuse   . COPD (chronic obstructive pulmonary disease)   . Chronic systolic CHF (congestive heart failure)   . Chronic  respiratory failure     a. On home O2 - due to COPD.  Marland Kitchen NSVT (nonsustained ventricular tachycardia)   . Hypotension   . AICD (automatic cardioverter/defibrillator) present   . Thrombocytopenia   . Pulmonary HTN   . Syncope     a. 03/2013 in setting of hypotension.    Past Surgical History  Procedure Laterality Date  . Colon surgery    . Open heart surgery    . Coronary stent placement  Dec. 2012    DES to SVG to 1st and 2nd OM and SVG to the PDA  . Coronary artery bypass graft    . Coronary angioplasty    . Colon surgery      FOR HISTORY OF COLON CANCER    History  Substance Use Topics  . Smoking status: Former Smoker -- 0.50 packs/day for 50 years    Types: Cigarettes    Start date: 02/26/2012  . Smokeless tobacco: Never Used     Comment: he has tried to quit many times.  . Alcohol Use: No    Family  History  Problem Relation Age of Onset  . Heart attack Father   . Diabetes Father   . Breast cancer Mother     No Known Allergies  Medication list has been reviewed and updated.  Current Outpatient Prescriptions on File Prior to Visit  Medication Sig Dispense Refill  . albuterol (PROVENTIL) (2.5 MG/3ML) 0.083% nebulizer solution Take 3 mLs (2.5 mg total) by nebulization every 6 (six) hours as needed for wheezing.  75 mL  12  . aspirin EC 81 MG tablet Take 81 mg by mouth daily.       Marland Kitchen atorvastatin (LIPITOR) 40 MG tablet Take 1 tablet (40 mg total) by mouth daily.  90 tablet  1  . clopidogrel (PLAVIX) 75 MG tablet Take 1 tablet (75 mg total) by mouth daily.  90 tablet  0  . furosemide (LASIX) 40 MG tablet Take 1 tablet (40 mg total) by mouth 2 (two) times daily.  90 tablet  0  . Insulin Glargine (LANTUS SOLOSTAR) 100 UNIT/ML Solostar Pen Inject 20 Units into the skin daily at 10 pm.  15 mL  11  . insulin regular (HUMULIN R) 100 units/mL injection Inject 0.03 mLs (3 Units total) into the skin 3 (three) times daily before meals.  10 mL  11  . Insulin Syringes, Disposable, U-100 0.3 ML MISC 1 Syringe by Does not apply route 3 (three) times daily.  1 each  0  . ipratropium (ATROVENT) 0.02 % nebulizer solution Take 2.5 mLs (500 mcg total) by nebulization every 6 (six) hours.  75 mL  12  . Ipratropium-Albuterol (COMBIVENT) 20-100 MCG/ACT AERS respimat Inhale 1 puff into the lungs every 6 (six) hours.  1 Inhaler  12  . metoprolol succinate (TOPROL-XL) 25 MG 24 hr tablet Take 1 tablet (25 mg total) by mouth daily.  30 tablet  0  . NEEDLE, DISP, 22 G (B-D DISP NEEDLE 22GX3/4") 22G X 3/4" MISC 1 Syringe by Does not apply route daily.  1 each  0  . budesonide-formoterol (SYMBICORT) 160-4.5 MCG/ACT inhaler Inhale 2 puffs into the lungs 2 (two) times daily.  1 Inhaler  12  . nitroGLYCERIN (NITROSTAT) 0.4 MG SL tablet Place 0.4 mg under the tongue every 5 (five) minutes as needed. For chest pain        No current facility-administered medications on file prior to visit.    Review of Systems:  As per HPI- otherwise negative.   Physical Examination: Filed Vitals:   10/27/13 1057  BP: 99/52  Pulse: 92  Temp: 98.4 F (36.9 C)  Resp: 18   Filed Vitals:   10/27/13 1057  Weight: 142 lb (64.411 kg)   Body mass index is 20.37 kg/(m^2). Ideal Body Weight:    GEN: WDWN, NAD, Non-toxic, A & O x 3, looks much better today HEENT: Atraumatic, Normocephalic. Neck supple. No masses, No LAD. Ears and Nose: No external deformity. CV: RRR, No M/G/R. No JVD. No thrill. No extra heart sounds. PULM: CTA B, no wheezes, crackles, rhonchi. No retractions. No resp. distress. No accessory muscle use. ABD: S, NT, ND, +BS. No rebound. No HSM. EXTR: No c/c.  Mild edema only, he is wearing compression hose bilaterally NEURO Normal gait.  PSYCH: Normally interactive. Conversant. Not depressed or anxious appearing.  Calm demeanor.   Assessment and Plan: CHF (congestive heart failure) - Plan: Basic metabolic panel  Lower extremity edema  IDDM (insulin dependent diabetes mellitus) - Plan: Basic metabolic panel  CHF: feeling a lot better, recent MI probably contributed.  He is using lasix as needed- went over how to adjust this based on his weight.  Per our scale his weight is up today; however on his home scale he is doing ok.   Edema is much better, continue lasix as needed He is actually seeing cardiology tomorrow DM: will work on going up on lantus in hopes of being able to stop mealtime insulin.  He will increase his lantus by 2 units every 2 days as long as his glucose is over 150 fasting.  Per his weight expect he may need 30- 40 units.   Plan follow-up with labs and recheck here in about 3 weeks.   Signed Lamar Blinks, MD

## 2013-10-28 ENCOUNTER — Encounter: Payer: Self-pay | Admitting: Physician Assistant

## 2013-10-28 ENCOUNTER — Ambulatory Visit (INDEPENDENT_AMBULATORY_CARE_PROVIDER_SITE_OTHER): Payer: Medicare Other | Admitting: Physician Assistant

## 2013-10-28 ENCOUNTER — Encounter: Payer: Self-pay | Admitting: *Deleted

## 2013-10-28 VITALS — BP 100/60 | HR 83 | Ht 70.0 in | Wt 142.0 lb

## 2013-10-28 DIAGNOSIS — I2510 Atherosclerotic heart disease of native coronary artery without angina pectoris: Secondary | ICD-10-CM

## 2013-10-28 DIAGNOSIS — J449 Chronic obstructive pulmonary disease, unspecified: Secondary | ICD-10-CM

## 2013-10-28 DIAGNOSIS — I252 Old myocardial infarction: Secondary | ICD-10-CM

## 2013-10-28 DIAGNOSIS — Z95810 Presence of automatic (implantable) cardiac defibrillator: Secondary | ICD-10-CM

## 2013-10-28 DIAGNOSIS — E785 Hyperlipidemia, unspecified: Secondary | ICD-10-CM

## 2013-10-28 DIAGNOSIS — I255 Ischemic cardiomyopathy: Secondary | ICD-10-CM

## 2013-10-28 DIAGNOSIS — I5022 Chronic systolic (congestive) heart failure: Secondary | ICD-10-CM

## 2013-10-28 DIAGNOSIS — I10 Essential (primary) hypertension: Secondary | ICD-10-CM

## 2013-10-28 NOTE — Patient Instructions (Signed)
NO CHANGES WERE MADE TODAY  Your physician recommends that you schedule a follow-up appointment in: ABOUT 6 WEEKS WITH DR. Acie Fredrickson  YOU HAVE BEEN GIVEN A WORK NOTE TODAY

## 2013-10-28 NOTE — Progress Notes (Signed)
[place], [place], [place]  [address] Phone: [phone] Fax:  [phone]  Date:  10/28/2013   ID:  James Moon, DOB August 22, 1945, MRN [id number]  PCP:  Lamar Blinks, MD  Cardiologist:  Dr. Liam Rogers  Electrophysiologist:  Dr. Virl Axe    History of Present Illness: James Moon is a 68 y.o. male with a history of CAD, ischemic cardiomyopathy, status post CABG in 6962, chronic systolic CHF, status post ICD, COPD, diabetes, colon CA s/p colectomy.  He is status post PCI with DES to the S-OM1/OM2 and S-PDA in 2012.  He was admitted earlier this month with evidence of volume overload. He underwent echocardiogram which demonstrated improved LV function with an EF of 40-45%. He was then admitted 4/20-4/24 with a non-STEMI. He presented with hypotension amd hypoxemia.  Cardiac enzymes returned elevated. He underwent cardiac catheterization which demonstrated patent LIMA-LAD, occluded vein graft to the PDA and high-grade stenosis in the vein graft to the OM1/OM2. This was treated with a DES to the proximal graft and angiosculpt/NCPTCA of the mid graft in-stent restenosis.  Beta blocker and ACEI were held due to hypotension. He was placed back on Toprol. He was set up for home O2 given chronic hypoxia related to COPD.  He returns for follow up. He is overall doing well. He denies any chest pain. His breathing is improved. He tells me that he feels better than he has in quite some time. He denies PND. LE edema is much improved. He has chronic dyspnea and describes NYHA class IIb symptoms. He has not had to increase his Lasix since discharge to home.  Studies:  - LHC (10/20/13):  EF 20%, apical dyskinesis, severe native three-vessel disease, patent LIMA-mid LAD, SVG-PDA occluded with left to right collaterals supplying the PDA, sequential SVG-OM1/OM2 75% proximal and 95% mid in-stent restenosis.  PCI: Resolute DES to the proximal S-OM1/OM2 stenosis and angiosculpt/NCPTCA of  mid ISR  - Echo (10/17/13):  Mild LVH, EF 40-45%, anteroseptal HK, apical akinesis, grade 1 diastolic dysfunction, MAC, mild RVE, mild to moderate reduced RVSF, moderate TR, PASP 58 mm Hg   Recent Labs: 12/09/2012: TSH 2.29  04/22/2013: HDL Cholesterol 36*; LDL (calc) 24  10/15/2013: Pro B Natriuretic peptide (BNP) 2626.0*  10/16/2013: ALT 12  10/21/2013: Hemoglobin 14.3  10/27/2013: Creatinine 1.05; Potassium 4.4   Wt Readings from Last 3 Encounters:  10/28/13 142 lb (64.411 kg)  10/27/13 142 lb (64.411 kg)  10/22/13 135 lb 12.9 oz (61.6 kg)     Past Medical History  Diagnosis Date  . Diabetes mellitus   . Colon cancer     s/p colectomy   . Hypertension   . CAD (coronary artery disease)     a. CABG in 1996. b. NSTEMI 06/2011: DES x 2 to SVG to 1st and 2nd OM and SVG to PDA. c. 09/2013: NSTEMI s/p DES to prox SVG-OM1, NCPTCA of mSVG-OM1.  . Ischemic cardiomyopathy Dec. 2012    a. EF 20 to 25% 2012. b. 30-35% echo 3/13. c. EF 40-45% by echo 09/2013 but then at time of NSTEMI was 20% by cath later that month.  . Tobacco abuse   . COPD (chronic obstructive pulmonary disease)   . Chronic systolic CHF (congestive heart failure)   . Chronic respiratory failure     a. On home O2 - due to COPD.  Marland Kitchen NSVT (nonsustained ventricular tachycardia)   . Hypotension   . AICD (automatic cardioverter/defibrillator) present   . Thrombocytopenia   .  Pulmonary HTN   . Syncope     a. 03/2013 in setting of hypotension.    Current Outpatient Prescriptions  Medication Sig Dispense Refill  . aspirin EC 81 MG tablet Take 81 mg by mouth daily.       Marland Kitchen atorvastatin (LIPITOR) 40 MG tablet Take 1 tablet (40 mg total) by mouth daily.  90 tablet  1  . budesonide-formoterol (SYMBICORT) 160-4.5 MCG/ACT inhaler Inhale 2 puffs into the lungs 2 (two) times daily.  1 Inhaler  12  . clopidogrel (PLAVIX) 75 MG tablet Take 1 tablet (75 mg total) by mouth daily.  90 tablet  0  . furosemide (LASIX) 40 MG tablet Take  1 tablet (40 mg total) by mouth 2 (two) times daily.  90 tablet  0  . Insulin Glargine (LANTUS SOLOSTAR) 100 UNIT/ML Solostar Pen Inject 20 Units into the skin daily at 10 pm.  15 mL  11  . insulin regular (HUMULIN R) 100 units/mL injection Inject 0.03 mLs (3 Units total) into the skin 3 (three) times daily before meals.  10 mL  11  . Insulin Syringes, Disposable, U-100 0.3 ML MISC 1 Syringe by Does not apply route 3 (three) times daily.  1 each  0  . Ipratropium-Albuterol (COMBIVENT) 20-100 MCG/ACT AERS respimat Inhale 1 puff into the lungs every 6 (six) hours.  1 Inhaler  12  . metoprolol succinate (TOPROL-XL) 25 MG 24 hr tablet Take 1 tablet (25 mg total) by mouth daily.  30 tablet  0  . NEEDLE, DISP, 22 G (B-D DISP NEEDLE 22GX3/4") 22G X 3/4" MISC 1 Syringe by Does not apply route daily.  1 each  0  . nitroGLYCERIN (NITROSTAT) 0.4 MG SL tablet Place 0.4 mg under the tongue every 5 (five) minutes as needed. For chest pain       No current facility-administered medications for this visit.    Allergies:   Review of patient's allergies indicates no known allergies.   Social History:  The patient  reports that he has quit smoking. His smoking use included Cigarettes. He started smoking about 20 months ago. He has a 25 pack-year smoking history. He has never used smokeless tobacco. He reports that he does not drink alcohol or use illicit drugs.   Family History:  The patient's family history includes Breast cancer in his mother; Diabetes in his father; Heart attack in his father.   ROS:  Please see the history of present illness.      All other systems reviewed and negative.   PHYSICAL EXAM: VS:  BP 100/60  Pulse 83  Ht 5\' 10"  (1.778 m)  Wt 142 lb (64.411 kg)  BMI 20.37 kg/m2 Well nourished, well developed, in no acute distress HEENT: normal Neck: no JVD Cardiac:  Distant heart sounds, regular in rhythm, no murmur Lungs:  Decreased breath sounds, no rales Abd: soft, nontender, no  hepatomegaly Ext: 1+ bilateral ankle edemaright groin without hematoma or bruit  Skin: warm and dry Neuro:  CNs 2-12 intact, no focal abnormalities noted  EKG:  NSR, HR 83, LAD, interventricular conduction delay, nonspecific ST-T wave changes, no change from prior trace     ASSESSMENT AND PLAN:  1. CAD: Patient is doing well after recent non-STEMI treated with drug-eluting stent to the vein graft to the OM1/OM2 as well as angiosculpt/NCPTCA to the distal graft in-stent restenosis.  We discussed the importance of dual anti-platelet therapy.  He is eager to return to work. He is a Tourist information centre manager at  [employer]. I think he will be fine to return at this time. Continue aspirin, Plavix, statin, beta blocker. 2. Ischemic cardiomyopathy: His blood pressure will not tolerate resuming his ACE inhibitor. Continue Toprol. If his blood pressure will allow, resume ACE inhibitor. Consider followup echocardiogram in 3 months. 3. Chronic systolic CHF: Volume stable.  Reason creatinine stable. Continue current therapy. 4. COPD: Continue followup with primary care. 5. Status post AICD: Followup with EP as planned. 6. Hypertension: As noted, her blood pressure will not allow titration of CHF medications at this time. 7. Hyperlipidemia: Continue statin. 8. Tobacco abuse: The patient admits to quitting smoking. I have congratulated him. 9. Disposition: Followup with Dr. Acie Fredrickson in 6 weeks.  Signed, Richardson Dopp, PA-C  10/28/2013 1:13 PM

## 2013-10-29 ENCOUNTER — Encounter: Payer: Self-pay | Admitting: Family Medicine

## 2013-11-03 ENCOUNTER — Encounter: Payer: Self-pay | Admitting: Cardiovascular Disease

## 2013-11-10 ENCOUNTER — Ambulatory Visit (INDEPENDENT_AMBULATORY_CARE_PROVIDER_SITE_OTHER): Payer: Medicare Other | Admitting: *Deleted

## 2013-11-10 DIAGNOSIS — I255 Ischemic cardiomyopathy: Secondary | ICD-10-CM

## 2013-11-10 DIAGNOSIS — I2589 Other forms of chronic ischemic heart disease: Secondary | ICD-10-CM

## 2013-11-10 LAB — MDC_IDC_ENUM_SESS_TYPE_REMOTE
Battery Remaining Percentage: 76 %
HighPow Impedance: 73 Ohm
Implantable Pulse Generator Serial Number: 827666
Lead Channel Impedance Value: 400 Ohm
Lead Channel Sensing Intrinsic Amplitude: 12 mV
Lead Channel Setting Pacing Amplitude: 2.5 V
MDC IDC SET LEADCHNL RV PACING PULSEWIDTH: 0.5 ms
MDC IDC SET LEADCHNL RV SENSING SENSITIVITY: 0.5 mV
MDC IDC STAT BRADY RV PERCENT PACED: 1 % — AB
Zone Setting Detection Interval: 250 ms
Zone Setting Detection Interval: 300 ms
Zone Setting Detection Interval: 350 ms

## 2013-11-10 NOTE — Progress Notes (Signed)
Remote ICD transmission.   

## 2013-11-11 ENCOUNTER — Ambulatory Visit (INDEPENDENT_AMBULATORY_CARE_PROVIDER_SITE_OTHER): Payer: Medicare Other | Admitting: Family Medicine

## 2013-11-11 VITALS — BP 96/60 | HR 88 | Temp 97.5°F | Resp 16 | Ht 69.0 in | Wt 141.0 lb

## 2013-11-11 DIAGNOSIS — E118 Type 2 diabetes mellitus with unspecified complications: Principal | ICD-10-CM

## 2013-11-11 DIAGNOSIS — E1165 Type 2 diabetes mellitus with hyperglycemia: Secondary | ICD-10-CM

## 2013-11-11 DIAGNOSIS — IMO0002 Reserved for concepts with insufficient information to code with codable children: Secondary | ICD-10-CM

## 2013-11-11 LAB — GLUCOSE, POCT (MANUAL RESULT ENTRY): POC Glucose: 173 mg/dl — AB (ref 70–99)

## 2013-11-11 NOTE — Patient Instructions (Addendum)
Increase your lantus insulin as follows: Check your sugar in the morning before you eat anything.  As long as your glucose is more than 150, increase your lantus by 2 units every 2 days.  When you reach 30 units please let me know if you are not at the goal of 150.  Please come and see me in one month- however if you are not feeling well please see me sooner or call!

## 2013-11-11 NOTE — Progress Notes (Signed)
[facility] [address] [place] [address] [phone]  Date:  11/11/2013   Name:  James Moon   DOB:  1946-06-22   MRN:  [id number]  PCP:  Lamar Blinks, MD    Chief Complaint: Follow-up   History of Present Illness:  James Moon is a 68 y.o. very pleasant male patient who presents with the following:  Here today for a recheck.  Seen at cardiology a couple of weeks ago. He was inpt twice last month with CHF exacerbation and a NSEMI, had a stent ploaced He is here today to look at his glucose.  His FBG was 194 this am A1c last month 10.0  He is taking lantus, 22 units a day and also mealtime regular insulin.    He is using oxygen at home. His breathing feels good today.  Overall he feels well.  Him weight at home was 136 this am, which is baseline for him.  He is adjusting his lasix as needed for edema.  Notes minimal BLE edema now  Wt Readings from Last 3 Encounters:  11/11/13 141 lb (63.957 kg)  10/28/13 142 lb (64.411 kg)  10/27/13 142 lb (64.411 kg)    Patient Active Problem List   Diagnosis Date Noted  . NSTEMI (non-ST elevated myocardial infarction) 10/21/2013  . Chronic combined systolic and diastolic CHF (congestive heart failure) 10/19/2013  . Hypotension 10/18/2013  . Hypoxemia 10/15/2013  . CHF exacerbation 10/15/2013  . Smoker 05/12/2013  . Acute on chronic systolic CHF (congestive heart failure) 04/22/2013  . Chronic respiratory failure 04/22/2013  . Edema, peripheral 12/09/2012  . 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 Dyspnea 06/26/2011  . COPD GOLD III 06/13/2011  . CAD (coronary artery disease) 06/07/2011  . Chronic systolic heart failure 25/10/3974  . IDDM (insulin dependent diabetes mellitus) 06/07/2011  . Ischemic cardiomyopathy 06/01/2011    Past Medical History  Diagnosis Date  . Diabetes mellitus   . Colon cancer     s/p colectomy   . Hypertension   . CAD (coronary artery disease)     a.  CABG in 1996. b. NSTEMI 06/2011: DES x 2 to SVG to 1st and 2nd OM and SVG to PDA. c. 09/2013: NSTEMI s/p DES to prox SVG-OM1, NCPTCA of mSVG-OM1.  . Ischemic cardiomyopathy Dec. 2012    a. EF 20 to 25% 2012. b. 30-35% echo 3/13. c. EF 40-45% by echo 09/2013 but then at time of NSTEMI was 20% by cath later that month.  . Tobacco abuse   . COPD (chronic obstructive pulmonary disease)   . Chronic systolic CHF (congestive heart failure)   . Chronic respiratory failure     a. On home O2 - due to COPD.  Marland Kitchen NSVT (nonsustained ventricular tachycardia)   . Hypotension   . AICD (automatic cardioverter/defibrillator) present   . Thrombocytopenia   . Pulmonary HTN   . Syncope     a. 03/2013 in setting of hypotension.    Past Surgical History  Procedure Laterality Date  . Colon surgery    . Open heart surgery    . Coronary stent placement  Dec. 2012    DES to SVG to 1st and 2nd OM and SVG to the PDA  . Coronary artery bypass graft    . Coronary angioplasty    . Colon surgery      FOR HISTORY OF COLON CANCER    History  Substance Use Topics  . Smoking status: Former Smoker --  0.50 packs/day for 50 years    Types: Cigarettes    Start date: 02/26/2012  . Smokeless tobacco: Never Used     Comment: he has tried to quit many times.  . Alcohol Use: No    Family History  Problem Relation Age of Onset  . Heart attack Father   . Diabetes Father   . Breast cancer Mother     No Known Allergies  Medication list has been reviewed and updated.  Current Outpatient Prescriptions on File Prior to Visit  Medication Sig Dispense Refill  . aspirin EC 81 MG tablet Take 81 mg by mouth daily.       Marland Kitchen atorvastatin (LIPITOR) 40 MG tablet Take 1 tablet (40 mg total) by mouth daily.  90 tablet  1  . budesonide-formoterol (SYMBICORT) 160-4.5 MCG/ACT inhaler Inhale 2 puffs into the lungs 2 (two) times daily.  1 Inhaler  12  . clopidogrel (PLAVIX) 75 MG tablet Take 1 tablet (75 mg total) by mouth daily.  90  tablet  0  . furosemide (LASIX) 40 MG tablet Take 1 tablet (40 mg total) by mouth 2 (two) times daily.  90 tablet  0  . Insulin Glargine (LANTUS SOLOSTAR) 100 UNIT/ML Solostar Pen Inject 20 Units into the skin daily at 10 pm.  15 mL  11  . insulin regular (HUMULIN R) 100 units/mL injection Inject 0.03 mLs (3 Units total) into the skin 3 (three) times daily before meals.  10 mL  11  . Insulin Syringes, Disposable, U-100 0.3 ML MISC 1 Syringe by Does not apply route 3 (three) times daily.  1 each  0  . Ipratropium-Albuterol (COMBIVENT) 20-100 MCG/ACT AERS respimat Inhale 1 puff into the lungs every 6 (six) hours.  1 Inhaler  12  . metoprolol succinate (TOPROL-XL) 25 MG 24 hr tablet Take 1 tablet (25 mg total) by mouth daily.  30 tablet  0  . NEEDLE, DISP, 22 G (B-D DISP NEEDLE 22GX3/4") 22G X 3/4" MISC 1 Syringe by Does not apply route daily.  1 each  0  . nitroGLYCERIN (NITROSTAT) 0.4 MG SL tablet Place 0.4 mg under the tongue every 5 (five) minutes as needed. For chest pain       No current facility-administered medications on file prior to visit.    Review of Systems:  As per HPI- otherwise negative.   Physical Examination: Filed Vitals:   11/11/13 0953  BP: 90/46  Pulse: 88  Temp: 97.5 F (36.4 C)  Resp: 16   Filed Vitals:   11/11/13 0953  Height: 5\' 9"  (1.753 m)  Weight: 141 lb (63.957 kg)   Body mass index is 20.81 kg/(m^2). Ideal Body Weight: Weight in (lb) to have BMI = 25: 168.9  GEN: WDWN, NAD, Non-toxic, A & O x 3, looks well today.  "I feel good."  HEENT: Atraumatic, Normocephalic. Neck supple. No masses, No LAD. Ears and Nose: No external deformity. CV: RRR, No M/G/R. No JVD. No thrill. No extra heart sounds. PULM: CTA B, no wheezes, crackles, rhonchi. No retractions. No resp. distress. No accessory muscle use. EXTR: No c/c.  Minimal edema BLE NEURO Normal gait.  PSYCH: Normally interactive. Conversant. Not depressed or anxious appearing.  Calm demeanor.    Results for orders placed in visit on 11/11/13  GLUCOSE, POCT (MANUAL RESULT ENTRY)      Result Value Ref Range   POC Glucose 173 (*) 70 - 99 mg/dl    Assessment and Plan: Type II or unspecified  type diabetes mellitus with unspecified complication, uncontrolled - Plan: POCT glucose (manual entry)  Continue to use lasix as needed for weight gain/ edema.   We need to bring his DM under better control to lessen his CV risk.  Will have him slowly titrate up his lantus; See patient instructions for more details.   Hope that we will be able to stop mealtime insulin soon Plan follow- up in one month- Sooner if worse.     Signed Lamar Blinks, MD

## 2013-11-18 ENCOUNTER — Telehealth: Payer: Self-pay

## 2013-11-18 NOTE — Telephone Encounter (Signed)
metoprolol succinate (TOPROL-XL) 25 MG 24 hr tablet  [facility]  [phone]

## 2013-11-19 ENCOUNTER — Other Ambulatory Visit (HOSPITAL_COMMUNITY): Payer: Self-pay | Admitting: Family Medicine

## 2013-11-20 DIAGNOSIS — I2589 Other forms of chronic ischemic heart disease: Secondary | ICD-10-CM

## 2013-11-20 DIAGNOSIS — I252 Old myocardial infarction: Secondary | ICD-10-CM

## 2013-11-20 DIAGNOSIS — Z95810 Presence of automatic (implantable) cardiac defibrillator: Secondary | ICD-10-CM

## 2013-11-20 DIAGNOSIS — I5022 Chronic systolic (congestive) heart failure: Secondary | ICD-10-CM

## 2013-11-20 DIAGNOSIS — E785 Hyperlipidemia, unspecified: Secondary | ICD-10-CM

## 2013-11-20 DIAGNOSIS — I10 Essential (primary) hypertension: Secondary | ICD-10-CM

## 2013-11-20 DIAGNOSIS — J449 Chronic obstructive pulmonary disease, unspecified: Secondary | ICD-10-CM

## 2013-11-20 MED ORDER — METOPROLOL SUCCINATE ER 25 MG PO TB24: 25.0000 mg | ORAL_TABLET | Freq: Every day | ORAL | Status: DC

## 2013-11-20 NOTE — Telephone Encounter (Signed)
Refilled for one month- just saw Dr. Lorelei Pont and advised to rtc in one month for a recheck.

## 2013-11-25 ENCOUNTER — Encounter: Payer: Self-pay | Admitting: Cardiovascular Disease

## 2013-11-25 ENCOUNTER — Ambulatory Visit (INDEPENDENT_AMBULATORY_CARE_PROVIDER_SITE_OTHER): Payer: Medicare Other | Admitting: Cardiovascular Disease

## 2013-11-25 VITALS — BP 93/59 | HR 72 | Ht 69.0 in | Wt 142.4 lb

## 2013-11-25 DIAGNOSIS — E785 Hyperlipidemia, unspecified: Secondary | ICD-10-CM

## 2013-11-25 DIAGNOSIS — I2510 Atherosclerotic heart disease of native coronary artery without angina pectoris: Secondary | ICD-10-CM

## 2013-11-25 DIAGNOSIS — F17200 Nicotine dependence, unspecified, uncomplicated: Secondary | ICD-10-CM

## 2013-11-25 MED ORDER — FUROSEMIDE 40 MG PO TABS: 40.0000 mg | ORAL_TABLET | Freq: Two times a day (BID) | ORAL | Status: DC

## 2013-11-25 NOTE — Patient Instructions (Signed)
Take Allegra, Zyrtec or Claritin for your cough/allergies  Your physician recommends that you continue on your current medications as directed. Please refer to the Current Medication list given to you today.  Your physician wants you to follow-up in: 6 months with Dr. Acie Fredrickson. You will receive a reminder letter in the mail two months in advance. If you don't receive a letter, please call our office to schedule the follow-up appointment.  Your physician recommends that you return for FASTING lab work in: 6 months on the same day or a few days before your office visit with Dr. Acie Fredrickson. Do not eat or drink after midnight except water

## 2013-11-25 NOTE — Assessment & Plan Note (Signed)
James Moon is doing well. He's not having episodes of chest pain or shortness of breath.   Continue with current medications.

## 2013-11-25 NOTE — Progress Notes (Signed)
James Moon Date of Birth  Sep 22, 1945 [place]  [address]    [place]   [place], [place]  [address]    [place], [place]  [address] [phone]  Fax  [phone]  [phone]  Fax [phone]  Problem List: 1. CAD - CABG 1996, s/p DES 2012 2. CHF - EF 25-30% 3. AICD placement  History of Present Illness:  James Moon is a 68 year old gentleman with a history of coronary artery disease. He status post non-ST segment elevation myocardial infarction in December. He had 2 stents placed. His ejection fraction was noted to be severely depressed at that time. He's been on good medical therapy. He returned for an echocardiogram last week which revealed persistently depressed left systolic function with ejection fraction by my reading of 25-30%. He has anterior and apical akinesis.  He's not had any symptoms of chest pain or shortness breath. He is overall done quite well. 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 He's not had any worsening symptoms. He denies any syncope or presyncope. He denies any PND or orthopnea.  He has seen Dr. Caryl Comes. An AICD was placed on 12/11/11.    Dec. 18, 2013  James Moon is a 68 yo with the above noted hx.  He has developed a runny nose and cough - I suspect it is a a viral illness.  He still smokes. He he denies any chest pain or shortness of breath. He still works 4 days week as a Actor.  December 23, 2012:  He restarted smoking again.  He is active at work and walks occasionally.  He had some leg swelling and Dr.  Caryl Comes increased his lasix to 40 QD with 80 mg  3 times a week.   He has an AICD.   The leg edema has improved but he is having some episodes of orthostasis.   Nov 25, 2013: He is doing well.  Has stopped smoking .   No CP or dypnea.  His chronic leg edema. He has compression hose to wear when he works. We have filled out his FMLA form. He works  at [employer] and is on his feet quite a bit   Current Outpatient Prescriptions on File Prior to Visit  Medication Sig Dispense Refill  . aspirin EC 81 MG tablet Take 81 mg by mouth daily.       Marland Kitchen atorvastatin (LIPITOR) 40 MG tablet Take 1 tablet (40 mg total) by mouth daily.  90 tablet  1  . budesonide-formoterol (SYMBICORT) 160-4.5 MCG/ACT inhaler Inhale 2 puffs into the lungs 2 (two) times daily.  1 Inhaler  12  . clopidogrel (PLAVIX) 75 MG tablet Take 1 tablet (75 mg total) by mouth daily.  90 tablet  0  . furosemide (LASIX) 40 MG tablet Take 1 tablet (40 mg total) by mouth 2 (two) times daily.  90 tablet  0  . Insulin Glargine (LANTUS SOLOSTAR) 100 UNIT/ML Solostar Pen Inject 20 Units into the skin daily at 10 pm.  15 mL  11  . insulin regular (HUMULIN R) 100 units/mL injection Inject 0.03 mLs (3 Units total) into the skin 3 (three) times daily before meals.  10 mL  11  . Insulin Syringes, Disposable, U-100 0.3 ML MISC 1 Syringe by Does not apply route 3 (three) times daily.  1 each  0  . Ipratropium-Albuterol (COMBIVENT) 20-100 MCG/ACT  AERS respimat Inhale 1 puff into the lungs every 6 (six) hours.  1 Inhaler  12  . metoprolol succinate (TOPROL-XL) 25 MG 24 hr tablet Take 1 tablet (25 mg total) by mouth daily.  30 tablet  0  . NEEDLE, DISP, 22 G (B-D DISP NEEDLE 22GX3/4") 22G X 3/4" MISC 1 Syringe by Does not apply route daily.  1 each  0  . nitroGLYCERIN (NITROSTAT) 0.4 MG SL tablet Place 0.4 mg under the tongue every 5 (five) minutes as needed. For chest pain       No current facility-administered medications on file prior to visit.    No Known Allergies  Past Medical History  Diagnosis Date  . Diabetes mellitus   . Colon cancer     s/p colectomy   . Hypertension   . CAD (coronary artery disease)     a. CABG in 1996. b. NSTEMI 06/2011: DES x 2 to SVG to 1st and 2nd OM and SVG to PDA. c. 09/2013: NSTEMI s/p DES to prox SVG-OM1, NCPTCA of mSVG-OM1.  . Ischemic cardiomyopathy Dec.  2012    a. EF 20 to 25% 2012. b. 30-35% echo 3/13. c. EF 40-45% by echo 09/2013 but then at time of NSTEMI was 20% by cath later that month.  . Tobacco abuse   . COPD (chronic obstructive pulmonary disease)   . Chronic systolic CHF (congestive heart failure)   . Chronic respiratory failure     a. On home O2 - due to COPD.  Marland Kitchen NSVT (nonsustained ventricular tachycardia)   . Hypotension   . AICD (automatic cardioverter/defibrillator) present   . Thrombocytopenia   . Pulmonary HTN   . Syncope     a. 03/2013 in setting of hypotension.    Past Surgical History  Procedure Laterality Date  . Colon surgery    . Open heart surgery    . Coronary stent placement  Dec. 2012    DES to SVG to 1st and 2nd OM and SVG to the PDA  . Coronary artery bypass graft    . Coronary angioplasty    . Colon surgery      FOR HISTORY OF COLON CANCER    History  Smoking status  . Former Smoker -- 0.50 packs/day for 50 years  . Types: Cigarettes  . Start date: 02/26/2012  Smokeless tobacco  . Never Used    Comment: he has tried to quit many times.    History  Alcohol Use No    Family History  Problem Relation Age of Onset  . Heart attack Father   . Diabetes Father   . Breast cancer Mother     Reviw of Systems:  Reviewed in the HPI.  All other systems are negative.  Physical Exam: Blood pressure 93/59, pulse 72, height 5\' 9"  (1.753 m), weight 142 lb 6.4 oz (64.592 kg). General: Well developed, well nourished, in no acute distress.  Head: Normocephalic, atraumatic, sclera non-icteric, mucus membranes are moist,   Neck: Supple. Carotids are 2 + without bruits. No JVD  Lungs: Clear bilaterally to auscultation.  He has an ICD in his left subclavian area. It is well healed. There is no hematoma or redness. He still has the Steri-Strips in place.  Heart: regular rate.  normal  S1 S2. There is a soft systolic murmur.  Abdomen: Soft, non-tender, non-distended with normal bowel sounds. No  hepatomegaly. No rebound/guarding. No masses.  Msk:  Strength and tone are normal  Extremities: No clubbing or cyanosis.  He has trace ankle edema. There some chronic stasis changes.  Neuro: Alert and oriented X 3. Moves all extremities spontaneously.  Psych:  Responds to questions appropriately with a normal affect.  ECG: 10/02/2011. Sinus bradycardia at 57 beats a minute. He hasn't left axis deviation. There is nonspecific IVCD. He has T-wave inversions in the lateral leads.  Assessment / Plan:

## 2013-11-25 NOTE — Assessment & Plan Note (Signed)
He has stopped smoking

## 2013-12-03 ENCOUNTER — Encounter: Payer: Self-pay | Admitting: Cardiology

## 2013-12-08 ENCOUNTER — Encounter: Payer: Self-pay | Admitting: Internal Medicine

## 2013-12-21 ENCOUNTER — Ambulatory Visit: Payer: Medicare Other | Admitting: *Deleted

## 2013-12-21 ENCOUNTER — Other Ambulatory Visit: Payer: Self-pay | Admitting: *Deleted

## 2013-12-21 MED ORDER — METOPROLOL SUCCINATE ER 25 MG PO TB24: 25.0000 mg | ORAL_TABLET | Freq: Every day | ORAL | Status: DC

## 2013-12-28 ENCOUNTER — Ambulatory Visit: Payer: Medicare Other | Admitting: Cardiovascular Disease

## 2013-12-30 ENCOUNTER — Encounter: Payer: Self-pay | Admitting: Internal Medicine

## 2013-12-30 ENCOUNTER — Ambulatory Visit (INDEPENDENT_AMBULATORY_CARE_PROVIDER_SITE_OTHER): Payer: Medicare Other | Admitting: Internal Medicine

## 2013-12-30 VITALS — BP 89/53 | HR 86 | Ht 70.0 in | Wt 143.0 lb

## 2013-12-30 DIAGNOSIS — Z95810 Presence of automatic (implantable) cardiac defibrillator: Secondary | ICD-10-CM

## 2013-12-30 DIAGNOSIS — I509 Heart failure, unspecified: Secondary | ICD-10-CM

## 2013-12-30 DIAGNOSIS — I5022 Chronic systolic (congestive) heart failure: Secondary | ICD-10-CM

## 2013-12-30 DIAGNOSIS — I255 Ischemic cardiomyopathy: Secondary | ICD-10-CM

## 2013-12-30 DIAGNOSIS — I2589 Other forms of chronic ischemic heart disease: Secondary | ICD-10-CM

## 2013-12-30 LAB — MDC_IDC_ENUM_SESS_TYPE_INCLINIC
Brady Statistic RV Percent Paced: 1 % — CL
HIGH POWER IMPEDANCE MEASURED VALUE: 72 Ohm
Implantable Pulse Generator Serial Number: 827666
Lead Channel Impedance Value: 380 Ohm
Lead Channel Pacing Threshold Amplitude: 0.75 V
Lead Channel Pacing Threshold Pulse Width: 0.4 ms
Lead Channel Sensing Intrinsic Amplitude: 12 mV
Lead Channel Setting Pacing Pulse Width: 0.5 ms
Lead Channel Setting Sensing Sensitivity: 0.5 mV
MDC IDC SET LEADCHNL RV PACING AMPLITUDE: 2.5 V
MDC IDC SET ZONE DETECTION INTERVAL: 300 ms
Zone Setting Detection Interval: 250 ms
Zone Setting Detection Interval: 350 ms

## 2013-12-30 NOTE — Progress Notes (Signed)
Patient Care Team: Darreld Mclean, MD as PCP - General (Family Medicine)   HPI  James Moon is a 68 y.o. male seen in followup for ICD implanted June 2013 for primary prevention in the setting of ischemic heart disease with prior bypass surgery and depressed left ventricular function.     Echocardiogram 4/15 EF 40-45%  he also had a non-STEMI and underwent catheterization  :: - LHC (10/20/13): EF 20%, apical dyskinesis, severe native three-vessel disease, patent LIMA-mid LAD, SVG-PDA occluded with left to right collaterals supplying the PDA, sequential SVG-OM1/OM2 75% proximal and 95% mid in-stent restenosis. PCI: Resolute DES to the proximal S-OM1/OM2 stenosis and angiosculpt/NCPTCA of mid ISR  The patient denies chest pain, shortness of breath, nocturnal dyspnea, orthopnea or peripheral edema.  There have been no palpitations, lightheadedness or syncope.        Past Medical History  Diagnosis Date  . Diabetes mellitus   . Colon cancer     s/p colectomy   . Hypertension   . CAD (coronary artery disease)     a. CABG in 1996. b. NSTEMI 06/2011: DES x 2 to SVG to 1st and 2nd OM and SVG to PDA. c. 09/2013: NSTEMI s/p DES to prox SVG-OM1, NCPTCA of mSVG-OM1.  . Ischemic cardiomyopathy Dec. 2012    a. EF 20 to 25% 2012. b. 30-35% echo 3/13. c. EF 40-45% by echo 09/2013 but then at time of NSTEMI was 20% by cath later that month.  . Tobacco abuse   . COPD (chronic obstructive pulmonary disease)   . Chronic systolic CHF (congestive heart failure)   . Chronic respiratory failure     a. On home O2 - due to COPD.  Marland Kitchen NSVT (nonsustained ventricular tachycardia)   . Hypotension   . AICD ([address] [place])   . Thrombocytopenia   . Pulmonary HTN   . Syncope     a. 03/2013 in setting of hypotension.    Past Surgical History  Procedure Laterality Date  . Colon surgery    . Open heart surgery    . Coronary stent placement  Dec. 2012    DES to SVG to 1st and 2nd OM and SVG to the  PDA  . Coronary artery bypass graft    . Coronary angioplasty    . Colon surgery      FOR HISTORY OF COLON CANCER    Current Outpatient Prescriptions  Medication Sig Dispense Refill  . aspirin EC 81 MG tablet Take 81 mg by mouth daily.       Marland Kitchen atorvastatin (LIPITOR) 40 MG tablet Take 1 tablet (40 mg total) by mouth daily.  90 tablet  1  . budesonide-formoterol (SYMBICORT) 160-4.5 MCG/ACT inhaler Inhale 2 puffs into the lungs 2 (two) times daily.  1 Inhaler  12  . clopidogrel (PLAVIX) 75 MG tablet Take 1 tablet (75 mg total) by mouth daily.  90 tablet  0  . furosemide (LASIX) 40 MG tablet Take 1 tablet (40 mg total) by mouth 2 (two) times daily.  180 tablet  3  . Insulin Glargine (LANTUS SOLOSTAR) 100 UNIT/ML Solostar Pen Inject 20 Units into the skin daily at 10 pm.  15 mL  11  . insulin regular (HUMULIN R) 100 units/mL injection Inject 0.03 mLs (3 Units total) into the skin 3 (three) times daily before meals.  10 mL  11  . Insulin Syringes, Disposable, U-100 0.3 ML MISC 1 Syringe by Does not apply route 3 (  three) times daily.  1 each  0  . NEEDLE, DISP, 22 G (B-D DISP NEEDLE 22GX3/4") 22G X 3/4" MISC 1 Syringe by Does not apply route daily.  1 each  0  . nitroGLYCERIN (NITROSTAT) 0.4 MG SL tablet Place 0.4 mg under the tongue every 5 (five) minutes as needed. For chest pain      . metoprolol succinate (TOPROL-XL) 25 MG 24 hr tablet Take 1 tablet (25 mg total) by mouth daily.  30 tablet  4   No current facility-administered medications for this visit.    No Known Allergies  Review of Systems negative except from HPI and PMH  Physical Exam BP 89/53  Pulse 86  Ht 5\' 10"  (1.778 m)  Wt 143 lb (64.864 kg)  BMI 20.52 kg/m2 Well developed and well nourished in no acute distress HENT normal E scleral and icterus clear Neck Supple JVP flat; carotids brisk and full Device pocket well healed; without hematoma or erythema.  There is no tethering Clear to ausculation  Regular rate and  rhythm, no murmurs gallops or rub Soft with active bowel sounds No clubbing cyanosis none Edema Alert and oriented, grossly normal motor and sensory function Skin Warm and Dry    Assessment and  Plan  Ischemic cardiomyopathy  ICD St Jude  The patient's device was interrogated.  The information was reviewed. No changes were made in the programming.    Hypotension  Derivatives when using for quite stable. We will see him again in a year

## 2014-01-03 ENCOUNTER — Other Ambulatory Visit: Payer: Self-pay | Admitting: Cardiovascular Disease

## 2014-02-01 ENCOUNTER — Encounter: Payer: Medicare Other | Attending: Family Medicine | Admitting: *Deleted

## 2014-02-01 ENCOUNTER — Encounter: Payer: Self-pay | Admitting: *Deleted

## 2014-02-01 DIAGNOSIS — E119 Type 2 diabetes mellitus without complications: Secondary | ICD-10-CM | POA: Insufficient documentation

## 2014-02-01 DIAGNOSIS — Z794 Long term (current) use of insulin: Secondary | ICD-10-CM | POA: Insufficient documentation

## 2014-02-01 DIAGNOSIS — Z713 Dietary counseling and surveillance: Secondary | ICD-10-CM | POA: Diagnosis present

## 2014-02-01 DIAGNOSIS — IMO0001 Reserved for inherently not codable concepts without codable children: Secondary | ICD-10-CM

## 2014-02-01 NOTE — Progress Notes (Signed)
Appt start time: 1100 end time:  1200.  Assessment:  Patient was seen on  02/01/14 for individual diabetes education. He has multiple health concerns.  He monitors his glucose once a day, fasting.  He hasn't had an eye exam in over a year, nor a dental exam since he wears a full set of dentures.  He is a smoker, but is trying to quit.  James Moon lives by himself and he admits that he doesn't cook much.  He uses the microwave for most of his food preparation.  He tries to use steamed vegetables, but he eats tacos, pizza, uses whole wheat bread.  He has diabetes education years ago and doesn't remember anything much.    Patient Education Plan per assessed needs and concerns is to attend individual session for Diabetes Self Management Education.  Current HbA1c: 10.0%  Preferred Learning Style:   Visual  Learning Readiness:   Ready  MEDICATIONS: see list.  Lantus and Humulin R for diabetes   DIETARY INTAKE:  Usual eating pattern includes 3 meals and 0-2 snacks per day.  Everyday foods include processed proteins, starches, sometimes vegetables.  Avoided foods include none.    24-hr recall:  B ( AM): eats out: 2 eggs, grits, bacon, whole wheat toast.  coffee  Snk ( AM): not usually  L ( PM): tomato sandwich 1-1.5 sandwiches.  Sometimes gets chicken and vegetables from the deli; sometimes corndog Snk ( PM): not usually D ( PM): tacos, pizza, fish baked in oven and steamed vegetable Snk ( PM): sugar-free waffers sometimes.   Beverages: black coffee, diet soda or regular soda, sometimes water  Usual physical activity: walks on treadmill 2 times a week.  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    Estimated energy needs: 2000 calories 225 g carbohydrates 150 g protein 56 g fat  Progress Towards Goal(s):  In progress.   Nutritional Diagnosis:  NB-1.1 Food and nutrition-related knowledge deficit As related to proper balance of fats, carbohydrates, and proteins needed for diabetic meal  planning.  As evidenced by HgA1c 10%.    Intervention:  Nutrition counseling provided.  Discussed diabetes disease process and treatment options.  Discussed physiology of diabetes and role of obesity on insulin resistance.  Encouraged moderate weight reduction to improve glucose levels.  Discussed role of medications and diet in glucose control  Provided education on macronutrients on glucose levels.  Provided education on carb counting, importance of regularly scheduled meals/snacks, and meal planning  Discussed effects of physical activity on glucose levels and long-term glucose control.  Recommended 150 minutes of physical activity/week.  Reviewed patient medications.  Discussed role of medication on blood glucose and possible side effects  Discussed blood glucose monitoring and interpretation.  Discussed recommended target ranges and individual ranges.    Described short-term complications: hyper- and hypo-glycemia.  Discussed causes,symptoms, and treatment options.  Discussed recommendations for long-term diabetes self-care.  Established checklist for medical, dental, and emotional self-care.  Teaching Method Utilized:  Visual Auditory Hands on  Handouts given during visit include: Living Well with Diabetes Carb Counting and Food Label handouts Meal Plan Card   Barriers to learning/adherence to lifestyle change: living alone and not wanting to cook   Diabetes self-care support plan:   [facility] support group   Demonstrated degree of understanding via:  Teach Back   Monitoring/Evaluation:  Dietary intake, exercise, BGM, and body weight in 3 month(s).

## 2014-02-17 ENCOUNTER — Encounter (HOSPITAL_COMMUNITY): Payer: Self-pay | Admitting: *Deleted

## 2014-02-17 ENCOUNTER — Ambulatory Visit (INDEPENDENT_AMBULATORY_CARE_PROVIDER_SITE_OTHER): Payer: Medicare Other | Admitting: *Deleted

## 2014-02-17 DIAGNOSIS — I5023 Acute on chronic systolic (congestive) heart failure: Secondary | ICD-10-CM

## 2014-02-17 DIAGNOSIS — I509 Heart failure, unspecified: Secondary | ICD-10-CM

## 2014-02-17 LAB — MDC_IDC_ENUM_SESS_TYPE_INCLINIC
Date Time Interrogation Session: 20150820092126
HighPow Impedance: 72 Ohm
Implantable Pulse Generator Serial Number: 827666
Lead Channel Impedance Value: 387.5 Ohm
Lead Channel Pacing Threshold Amplitude: 0.75 V
Lead Channel Pacing Threshold Pulse Width: 0.5 ms
Lead Channel Pacing Threshold Pulse Width: 0.5 ms
Lead Channel Setting Pacing Pulse Width: 0.5 ms
Lead Channel Setting Sensing Sensitivity: 0.5 mV
MDC IDC MSMT BATTERY REMAINING LONGEVITY: 82.8 mo
MDC IDC MSMT LEADCHNL RV PACING THRESHOLD AMPLITUDE: 0.75 V
MDC IDC MSMT LEADCHNL RV SENSING INTR AMPL: 12 mV
MDC IDC SET LEADCHNL RV PACING AMPLITUDE: 2.5 V
MDC IDC SET ZONE DETECTION INTERVAL: 300 ms
MDC IDC STAT BRADY RV PERCENT PACED: 0.02 %
Zone Setting Detection Interval: 250 ms
Zone Setting Detection Interval: 350 ms

## 2014-02-17 NOTE — Progress Notes (Signed)
ICD check in clinic. Normal device function. Thresholds and sensing consistent with previous device measurements. Impedance trends stable over time. No evidence of any ventricular arrhythmias.  Histogram distribution appropriate for patient and level of activity. No changes made this session. Device programmed at appropriate safety margins. Device programmed to optimize intrinsic conduction. Estimated longevity 6.9 years. Pt enrolled in remote follow-up. Plan to check device every 3 months remotely and in office annually. Patient education completed including shock plan. Alert tones/vibration demonstrated for patient.  Merlin 05/23/14.

## 2014-02-23 ENCOUNTER — Encounter: Payer: Self-pay | Admitting: Internal Medicine

## 2014-04-02 ENCOUNTER — Other Ambulatory Visit: Payer: Self-pay | Admitting: Cardiovascular Disease

## 2014-04-08 ENCOUNTER — Other Ambulatory Visit: Payer: Self-pay | Admitting: Cardiovascular Disease

## 2014-05-05 ENCOUNTER — Ambulatory Visit: Payer: Medicare Other | Admitting: *Deleted

## 2014-05-20 ENCOUNTER — Other Ambulatory Visit: Payer: Self-pay | Admitting: Cardiovascular Disease

## 2014-05-23 ENCOUNTER — Ambulatory Visit (INDEPENDENT_AMBULATORY_CARE_PROVIDER_SITE_OTHER): Payer: Medicare Other | Admitting: *Deleted

## 2014-05-23 DIAGNOSIS — I255 Ischemic cardiomyopathy: Secondary | ICD-10-CM

## 2014-05-23 NOTE — Progress Notes (Signed)
Remote ICD transmission.   

## 2014-05-25 LAB — MDC_IDC_ENUM_SESS_TYPE_REMOTE
Brady Statistic RV Percent Paced: 1 % — CL
HIGH POWER IMPEDANCE MEASURED VALUE: 75 Ohm
Implantable Pulse Generator Serial Number: 827666
Lead Channel Setting Sensing Sensitivity: 0.5 mV
MDC IDC MSMT BATTERY REMAINING PERCENTAGE: 71 %
MDC IDC MSMT LEADCHNL RV IMPEDANCE VALUE: 360 Ohm
MDC IDC MSMT LEADCHNL RV SENSING INTR AMPL: 12 mV
MDC IDC SET LEADCHNL RV PACING AMPLITUDE: 2.5 V
MDC IDC SET LEADCHNL RV PACING PULSEWIDTH: 0.5 ms
Zone Setting Detection Interval: 250 ms
Zone Setting Detection Interval: 300 ms
Zone Setting Detection Interval: 350 ms

## 2014-06-06 ENCOUNTER — Encounter: Payer: Self-pay | Admitting: Cardiology

## 2014-06-08 ENCOUNTER — Encounter (HOSPITAL_COMMUNITY): Payer: Self-pay | Admitting: Cardiology

## 2014-06-09 ENCOUNTER — Encounter (HOSPITAL_COMMUNITY): Payer: Self-pay | Admitting: Internal Medicine

## 2014-06-10 ENCOUNTER — Encounter: Payer: Self-pay | Admitting: Internal Medicine

## 2014-06-16 ENCOUNTER — Telehealth: Payer: Self-pay

## 2014-06-16 NOTE — Telephone Encounter (Signed)
LMVM for patient to get his flu shot.

## 2014-06-20 ENCOUNTER — Other Ambulatory Visit: Payer: Self-pay | Admitting: Cardiovascular Disease

## 2014-07-04 ENCOUNTER — Other Ambulatory Visit: Payer: Self-pay | Admitting: Cardiovascular Disease

## 2014-07-06 ENCOUNTER — Other Ambulatory Visit: Payer: Self-pay

## 2014-07-06 MED ORDER — FUROSEMIDE 40 MG PO TABS: 40.0000 mg | ORAL_TABLET | Freq: Two times a day (BID) | ORAL | Status: DC

## 2014-07-13 ENCOUNTER — Other Ambulatory Visit: Payer: Self-pay | Admitting: Cardiovascular Disease

## 2014-08-01 ENCOUNTER — Emergency Department (HOSPITAL_COMMUNITY)
Admission: EM | Admit: 2014-08-01 | Discharge: 2014-08-01 | Disposition: A | Discharge status: Home / Self Care | Payer: Self-pay

## 2014-08-18 ENCOUNTER — Telehealth: Payer: Self-pay | Admitting: Cardiovascular Disease

## 2014-08-18 ENCOUNTER — Ambulatory Visit (INDEPENDENT_AMBULATORY_CARE_PROVIDER_SITE_OTHER): Payer: PPO | Admitting: *Deleted

## 2014-08-18 DIAGNOSIS — Z95810 Presence of automatic (implantable) cardiac defibrillator: Secondary | ICD-10-CM | POA: Diagnosis not present

## 2014-08-18 DIAGNOSIS — I5042 Chronic combined systolic (congestive) and diastolic (congestive) heart failure: Secondary | ICD-10-CM

## 2014-08-18 DIAGNOSIS — I255 Ischemic cardiomyopathy: Secondary | ICD-10-CM | POA: Diagnosis not present

## 2014-08-18 LAB — MDC_IDC_ENUM_SESS_TYPE_INCLINIC
Battery Remaining Longevity: 76.8 mo
Brady Statistic RV Percent Paced: 0 %
Date Time Interrogation Session: 20160218142008
HighPow Impedance: 71 Ohm
Lead Channel Impedance Value: 350 Ohm
Lead Channel Pacing Threshold Amplitude: 1 V
Lead Channel Pacing Threshold Pulse Width: 0.5 ms
Lead Channel Setting Pacing Pulse Width: 0.5 ms
Lead Channel Setting Sensing Sensitivity: 0.5 mV
MDC IDC MSMT LEADCHNL RV PACING THRESHOLD AMPLITUDE: 1 V
MDC IDC MSMT LEADCHNL RV PACING THRESHOLD PULSEWIDTH: 0.5 ms
MDC IDC MSMT LEADCHNL RV SENSING INTR AMPL: 12 mV
MDC IDC PG SERIAL: 827666
MDC IDC SET LEADCHNL RV PACING AMPLITUDE: 2.5 V
MDC IDC SET ZONE DETECTION INTERVAL: 250 ms
Zone Setting Detection Interval: 300 ms
Zone Setting Detection Interval: 350 ms

## 2014-08-18 MED ORDER — NITROGLYCERIN 0.4 MG SL SUBL
0.4000 mg | SUBLINGUAL_TABLET | SUBLINGUAL | Status: DC | PRN
Start: 2014-08-18 — End: 2014-08-22

## 2014-08-18 NOTE — Telephone Encounter (Signed)
Walk in pt form " pt needs refills" gave to [facility]

## 2014-08-18 NOTE — Progress Notes (Signed)
ICD check in clinic. Normal device function. Threshold and sensing consistent with previous device measurements. Impedance trends stable over time. No evidence of any ventricular arrhythmias. Histogram distribution appropriate for patient and level of activity. Changed per ST protocol. Device programmed at appropriate safety margins. Device programmed to optimize intrinsic conduction. Estimated longevity 6.71yrs. Merlin 11/17/14 & ROV w/ Dr. Caryl Comes in 56mo.

## 2014-08-22 ENCOUNTER — Other Ambulatory Visit: Payer: Self-pay | Admitting: *Deleted

## 2014-08-22 ENCOUNTER — Other Ambulatory Visit: Payer: Self-pay

## 2014-08-22 DIAGNOSIS — I255 Ischemic cardiomyopathy: Secondary | ICD-10-CM

## 2014-08-22 MED ORDER — NITROGLYCERIN 0.4 MG SL SUBL: 0.4000 mg | SUBLINGUAL_TABLET | SUBLINGUAL | Status: DC | PRN

## 2014-08-26 ENCOUNTER — Other Ambulatory Visit: Payer: Self-pay | Admitting: Cardiovascular Disease

## 2014-08-30 ENCOUNTER — Ambulatory Visit (INDEPENDENT_AMBULATORY_CARE_PROVIDER_SITE_OTHER): Payer: PPO | Admitting: Cardiovascular Disease

## 2014-08-30 ENCOUNTER — Emergency Department (HOSPITAL_COMMUNITY): Payer: PPO

## 2014-08-30 ENCOUNTER — Encounter (HOSPITAL_COMMUNITY): Payer: Self-pay | Admitting: Neurology

## 2014-08-30 ENCOUNTER — Inpatient Hospital Stay (HOSPITAL_COMMUNITY)
Admission: EM | Admit: 2014-08-30 | Discharge: 2014-09-03 | DRG: 291 | Disposition: A | Discharge status: Home / Self Care | Payer: PPO | Attending: Internal Medicine | Admitting: Internal Medicine

## 2014-08-30 ENCOUNTER — Encounter: Payer: Self-pay | Admitting: Cardiovascular Disease

## 2014-08-30 VITALS — BP 82/56 | HR 87 | Ht 70.0 in | Wt 145.0 lb

## 2014-08-30 DIAGNOSIS — I83009 Varicose veins of unspecified lower extremity with ulcer of unspecified site: Secondary | ICD-10-CM | POA: Diagnosis present

## 2014-08-30 DIAGNOSIS — Z85038 Personal history of other malignant neoplasm of large intestine: Secondary | ICD-10-CM | POA: Diagnosis not present

## 2014-08-30 DIAGNOSIS — I2510 Atherosclerotic heart disease of native coronary artery without angina pectoris: Secondary | ICD-10-CM | POA: Diagnosis present

## 2014-08-30 DIAGNOSIS — D696 Thrombocytopenia, unspecified: Secondary | ICD-10-CM | POA: Diagnosis present

## 2014-08-30 DIAGNOSIS — Z7902 Long term (current) use of antithrombotics/antiplatelets: Secondary | ICD-10-CM

## 2014-08-30 DIAGNOSIS — I5042 Chronic combined systolic (congestive) and diastolic (congestive) heart failure: Secondary | ICD-10-CM | POA: Diagnosis present

## 2014-08-30 DIAGNOSIS — J9622 Acute and chronic respiratory failure with hypercapnia: Secondary | ICD-10-CM | POA: Diagnosis present

## 2014-08-30 DIAGNOSIS — IMO0001 Reserved for inherently not codable concepts without codable children: Secondary | ICD-10-CM

## 2014-08-30 DIAGNOSIS — L97909 Non-pressure chronic ulcer of unspecified part of unspecified lower leg with unspecified severity: Secondary | ICD-10-CM | POA: Diagnosis present

## 2014-08-30 DIAGNOSIS — I5023 Acute on chronic systolic (congestive) heart failure: Secondary | ICD-10-CM

## 2014-08-30 DIAGNOSIS — Z87891 Personal history of nicotine dependence: Secondary | ICD-10-CM

## 2014-08-30 DIAGNOSIS — I10 Essential (primary) hypertension: Secondary | ICD-10-CM | POA: Diagnosis present

## 2014-08-30 DIAGNOSIS — I9589 Other hypotension: Secondary | ICD-10-CM | POA: Diagnosis not present

## 2014-08-30 DIAGNOSIS — Z79899 Other long term (current) drug therapy: Secondary | ICD-10-CM | POA: Diagnosis not present

## 2014-08-30 DIAGNOSIS — N179 Acute kidney failure, unspecified: Secondary | ICD-10-CM | POA: Diagnosis present

## 2014-08-30 DIAGNOSIS — I959 Hypotension, unspecified: Secondary | ICD-10-CM

## 2014-08-30 DIAGNOSIS — Z9981 Dependence on supplemental oxygen: Secondary | ICD-10-CM

## 2014-08-30 DIAGNOSIS — L97919 Non-pressure chronic ulcer of unspecified part of right lower leg with unspecified severity: Secondary | ICD-10-CM

## 2014-08-30 DIAGNOSIS — M7989 Other specified soft tissue disorders: Secondary | ICD-10-CM

## 2014-08-30 DIAGNOSIS — Z951 Presence of aortocoronary bypass graft: Secondary | ICD-10-CM | POA: Diagnosis not present

## 2014-08-30 DIAGNOSIS — Z7982 Long term (current) use of aspirin: Secondary | ICD-10-CM | POA: Diagnosis not present

## 2014-08-30 DIAGNOSIS — R609 Edema, unspecified: Secondary | ICD-10-CM

## 2014-08-30 DIAGNOSIS — J449 Chronic obstructive pulmonary disease, unspecified: Secondary | ICD-10-CM | POA: Diagnosis present

## 2014-08-30 DIAGNOSIS — I255 Ischemic cardiomyopathy: Secondary | ICD-10-CM | POA: Diagnosis present

## 2014-08-30 DIAGNOSIS — Z95810 Presence of automatic (implantable) cardiac defibrillator: Secondary | ICD-10-CM | POA: Diagnosis not present

## 2014-08-30 DIAGNOSIS — I272 Other secondary pulmonary hypertension: Secondary | ICD-10-CM | POA: Diagnosis present

## 2014-08-30 DIAGNOSIS — R0602 Shortness of breath: Secondary | ICD-10-CM | POA: Diagnosis not present

## 2014-08-30 DIAGNOSIS — I83019 Varicose veins of right lower extremity with ulcer of unspecified site: Secondary | ICD-10-CM

## 2014-08-30 DIAGNOSIS — Z794 Long term (current) use of insulin: Secondary | ICD-10-CM

## 2014-08-30 DIAGNOSIS — Z955 Presence of coronary angioplasty implant and graft: Secondary | ICD-10-CM | POA: Diagnosis not present

## 2014-08-30 DIAGNOSIS — E119 Type 2 diabetes mellitus without complications: Secondary | ICD-10-CM | POA: Diagnosis present

## 2014-08-30 DIAGNOSIS — R21 Rash and other nonspecific skin eruption: Secondary | ICD-10-CM

## 2014-08-30 DIAGNOSIS — I83029 Varicose veins of left lower extremity with ulcer of unspecified site: Secondary | ICD-10-CM

## 2014-08-30 DIAGNOSIS — R0902 Hypoxemia: Secondary | ICD-10-CM

## 2014-08-30 DIAGNOSIS — D649 Anemia, unspecified: Secondary | ICD-10-CM | POA: Diagnosis present

## 2014-08-30 DIAGNOSIS — I252 Old myocardial infarction: Secondary | ICD-10-CM

## 2014-08-30 HISTORY — DX: Non-pressure chronic ulcer of unspecified part of right lower leg with unspecified severity: L97.919

## 2014-08-30 HISTORY — DX: Non-pressure chronic ulcer of unspecified part of left lower leg with unspecified severity: L97.929

## 2014-08-30 HISTORY — DX: Varicose veins of left lower extremity with ulcer of unspecified site: I83.029

## 2014-08-30 HISTORY — DX: Varicose veins of right lower extremity with ulcer of unspecified site: I83.019

## 2014-08-30 LAB — GLUCOSE, CAPILLARY: Glucose-Capillary: 236 mg/dL — ABNORMAL HIGH (ref 70–99)

## 2014-08-30 LAB — CBC
HCT: 43.6 % (ref 39.0–52.0)
HEMATOCRIT: 42.8 % (ref 39.0–52.0)
Hemoglobin: 12.5 g/dL — ABNORMAL LOW (ref 13.0–17.0)
Hemoglobin: 12.9 g/dL — ABNORMAL LOW (ref 13.0–17.0)
MCH: 24 pg — ABNORMAL LOW (ref 26.0–34.0)
MCH: 24.1 pg — AB (ref 26.0–34.0)
MCHC: 29.2 g/dL — ABNORMAL LOW (ref 30.0–36.0)
MCHC: 29.6 g/dL — AB (ref 30.0–36.0)
MCV: 82.3 fL (ref 78.0–100.0)
MCV: 81.5 fL (ref 78.0–100.0)
PLATELETS: 122 10*3/uL — AB (ref 150–400)
Platelets: 129 10*3/uL — ABNORMAL LOW (ref 150–400)
RBC: 5.2 MIL/uL (ref 4.22–5.81)
RBC: 5.35 MIL/uL (ref 4.22–5.81)
RDW: 20.2 % — ABNORMAL HIGH (ref 11.5–15.5)
RDW: 20.3 % — ABNORMAL HIGH (ref 11.5–15.5)
WBC: 6 10*3/uL (ref 4.0–10.5)
WBC: 6.5 10*3/uL (ref 4.0–10.5)

## 2014-08-30 LAB — CREATININE, SERUM
Creatinine, Ser: 1.64 mg/dL — ABNORMAL HIGH (ref 0.50–1.35)
GFR calc non Af Amer: 41 mL/min — ABNORMAL LOW (ref >90)
GFR, EST AFRICAN AMERICAN: 48 mL/min — AB (ref >90)

## 2014-08-30 LAB — BASIC METABOLIC PANEL
Anion gap: 11 (ref 5–15)
BUN: 44 mg/dL — AB (ref 6–23)
CALCIUM: 8.5 mg/dL (ref 8.4–10.5)
CO2: 32 mmol/L (ref 19–32)
CREATININE: 1.75 mg/dL — AB (ref 0.50–1.35)
Chloride: 92 mmol/L — ABNORMAL LOW (ref 96–112)
GFR, EST AFRICAN AMERICAN: 44 mL/min — AB (ref >90)
GFR, EST NON AFRICAN AMERICAN: 38 mL/min — AB (ref >90)
Glucose, Bld: 361 mg/dL — ABNORMAL HIGH (ref 70–99)
Potassium: 4.5 mmol/L (ref 3.5–5.1)
Sodium: 135 mmol/L (ref 135–145)

## 2014-08-30 LAB — I-STAT TROPONIN, ED: Troponin i, poc: 0.05 ng/mL (ref 0.00–0.08)

## 2014-08-30 LAB — SEDIMENTATION RATE: Sed Rate: 16 mm/hr (ref 0–16)

## 2014-08-30 LAB — TROPONIN I: Troponin I: 0.08 ng/mL — ABNORMAL HIGH (ref <0.031)

## 2014-08-30 LAB — BRAIN NATRIURETIC PEPTIDE: B Natriuretic Peptide: 547.1 pg/mL — ABNORMAL HIGH (ref 0.0–100.0)

## 2014-08-30 MED ORDER — ASPIRIN 81 MG PO CHEW
CHEWABLE_TABLET | ORAL | Status: AC
  Administered 2014-08-30: 81 mg
  Filled 2014-08-30: qty 1

## 2014-08-30 MED ORDER — CLOPIDOGREL BISULFATE 75 MG PO TABS
75.0000 mg | ORAL_TABLET | Freq: Every day | ORAL | Status: DC
  Administered 2014-08-31 – 2014-09-03 (×4): 75 mg via ORAL
  Filled 2014-08-30 (×4): qty 1

## 2014-08-30 MED ORDER — INSULIN GLARGINE 100 UNIT/ML ~~LOC~~ SOLN
20.0000 [IU] | Freq: Every day | SUBCUTANEOUS | Status: DC
  Administered 2014-08-31 – 2014-09-01 (×2): 20 [IU] via SUBCUTANEOUS
  Filled 2014-08-30 (×3): qty 0.2

## 2014-08-30 MED ORDER — ATORVASTATIN CALCIUM 40 MG PO TABS
40.0000 mg | ORAL_TABLET | Freq: Every day | ORAL | Status: DC
  Administered 2014-08-31 – 2014-09-03 (×4): 40 mg via ORAL
  Filled 2014-08-30 (×4): qty 1

## 2014-08-30 MED ORDER — SODIUM CHLORIDE 0.9 % IJ SOLN
3.0000 mL | Freq: Two times a day (BID) | INTRAMUSCULAR | Status: DC
  Administered 2014-08-31 – 2014-09-03 (×6): 3 mL via INTRAVENOUS

## 2014-08-30 MED ORDER — NITROGLYCERIN 0.4 MG SL SUBL: 0.4000 mg | SUBLINGUAL_TABLET | SUBLINGUAL | Status: DC | PRN

## 2014-08-30 MED ORDER — INSULIN ASPART 100 UNIT/ML ~~LOC~~ SOLN
0.0000 [IU] | SUBCUTANEOUS | Status: DC
  Administered 2014-08-30: 3 [IU] via SUBCUTANEOUS
  Administered 2014-08-31: 7 [IU] via SUBCUTANEOUS
  Administered 2014-08-31: 3 [IU] via SUBCUTANEOUS
  Administered 2014-08-31: 1 [IU] via SUBCUTANEOUS
  Administered 2014-09-01: 3 [IU] via SUBCUTANEOUS
  Administered 2014-09-01: 7 [IU] via SUBCUTANEOUS
  Administered 2014-09-01: 3 [IU] via SUBCUTANEOUS

## 2014-08-30 MED ORDER — HEPARIN SODIUM (PORCINE) 5000 UNIT/ML IJ SOLN
5000.0000 [IU] | Freq: Three times a day (TID) | INTRAMUSCULAR | Status: DC
  Administered 2014-08-30 – 2014-09-03 (×11): 5000 [IU] via SUBCUTANEOUS
  Filled 2014-08-30 (×14): qty 1

## 2014-08-30 MED ORDER — SODIUM CHLORIDE 0.9 % IV SOLN: 250.0000 mL | INTRAVENOUS | Status: DC | PRN

## 2014-08-30 MED ORDER — SODIUM CHLORIDE 0.9 % IJ SOLN: 3.0000 mL | INTRAMUSCULAR | Status: DC | PRN

## 2014-08-30 MED ORDER — SODIUM CHLORIDE 0.9 % IJ SOLN
3.0000 mL | Freq: Two times a day (BID) | INTRAMUSCULAR | Status: DC
  Administered 2014-08-31 – 2014-09-02 (×4): 3 mL via INTRAVENOUS

## 2014-08-30 MED ORDER — MORPHINE SULFATE 4 MG/ML IJ SOLN
4.0000 mg | Freq: Once | INTRAMUSCULAR | Status: AC
  Administered 2014-08-30: 4 mg via INTRAVENOUS
  Filled 2014-08-30: qty 1

## 2014-08-30 MED ORDER — ASPIRIN EC 81 MG PO TBEC
81.0000 mg | DELAYED_RELEASE_TABLET | Freq: Every day | ORAL | Status: DC
  Administered 2014-08-31 – 2014-09-03 (×4): 81 mg via ORAL
  Filled 2014-08-30 (×4): qty 1

## 2014-08-30 MED ORDER — BUDESONIDE-FORMOTEROL FUMARATE 160-4.5 MCG/ACT IN AERO
2.0000 | INHALATION_SPRAY | Freq: Two times a day (BID) | RESPIRATORY_TRACT | Status: DC
  Administered 2014-08-31 – 2014-09-02 (×5): 2 via RESPIRATORY_TRACT
  Filled 2014-08-30 (×2): qty 6

## 2014-08-30 NOTE — Progress Notes (Signed)
Cardiology Office Note   Date:  08/30/2014   ID:  James Moon, DOB 02-01-1946, MRN [id number]  PCP:  Lamar Blinks, MD  Cardiologist:   Thayer Headings, MD   No chief complaint on file.  Problem List  1. CAD - CABG 1996, s/p DES 2012 2. CHF - EF 25-30% 3. AICD placement 4.  Pulmonary Hypertension -  5. Ongoing cigarette abuse 6. Chronic leg edema   History of Present Illness:  James Moon is a 69 year old gentleman with a history of coronary artery disease. He status post non-ST segment elevation myocardial infarction in December. He had 2 stents placed. His ejection fraction was noted to be severely depressed at that time. He's been on good medical therapy. He returned for an echocardiogram last week which revealed persistently depressed left systolic function with ejection fraction by my reading of 25-30%. He has anterior and apical akinesis.  He's not had any symptoms of chest pain or shortness breath. He is overall done quite well. 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 He's not had any worsening symptoms. He denies any syncope or presyncope. He denies any PND or orthopnea.  He has seen Dr. Caryl Comes. An AICD was placed on 12/11/11.   Dec. 18, 2013  James Moon is a 69 yo with the above noted hx. He has developed a runny nose and cough - I suspect it is a a viral illness. He still smokes. He he denies any chest pain or shortness of breath. He still works 4 days week as a Actor.  December 23, 2012:  He restarted smoking again. He is active at work and walks occasionally. He had some leg swelling and Dr. Caryl Comes increased his lasix to 40 QD with 80 mg 3 times a week. He has an AICD.  The leg edema has improved but he is having some episodes of orthostasis.   Nov 25, 2013: He is doing well. Has stopped smoking . No CP or dypnea. His chronic leg edema. He has compression hose to wear when he works. We have filled out  his FMLA form. He works at [employer] and is on his feet quite a bit   August 30, 2014:    James Moon is a 69 y.o. male who presents for follow-up on his chronic combined systolic and diastolic congestive heart failure. He has significant leg edema. He's developed an ulceration and now has weeping from his leg.  He has known pulmonary Hypertension with PA pressures around 60 by echo March 2015   He is not having any dyspnea.  No CP.   He has had severe leg edema for years and this has worsened over the past several months . He has had weeping edema for the past several months.  He works as a Tourist information centre manager at [employer] and is on his feet quite a bit.   Past Medical History  Diagnosis Date  . Diabetes mellitus   . Colon cancer     s/p colectomy   . Hypertension   . CAD (coronary artery disease)     a. CABG in 1996. b. NSTEMI 06/2011: DES x 2 to SVG to 1st and 2nd OM and SVG to PDA. c. 09/2013: NSTEMI s/p DES to prox SVG-OM1, NCPTCA of mSVG-OM1.  . Ischemic cardiomyopathy Dec. 2012    a. EF 20 to 25% 2012. b. 30-35% echo 3/13. c. EF 40-45% by echo 09/2013 but then at time of NSTEMI  was 20% by cath later that month.  . Tobacco abuse   . COPD (chronic obstructive pulmonary disease)   . Chronic systolic CHF (congestive heart failure)   . Chronic respiratory failure     a. On home O2 - due to COPD.  Marland Kitchen NSVT (nonsustained ventricular tachycardia)   . Hypotension   . AICD ([address] [place])   . Thrombocytopenia   . Pulmonary HTN   . Syncope     a. 03/2013 in setting of hypotension.    Past Surgical History  Procedure Laterality Date  . Colon surgery    . Open heart surgery  01/1995  . Coronary stent placement  Dec. 2012    DES to SVG to 1st and 2nd OM and SVG to the PDA  . Coronary artery bypass graft  01/1995  . Coronary angioplasty    . Colon surgery      FOR HISTORY OF COLON CANCER  . Left heart catheterization with coronary angiogram N/A 06/10/2011    Procedure: LEFT HEART CATHETERIZATION  WITH CORONARY ANGIOGRAM;  Surgeon: Minus Breeding, MD;  Location[facility] CATH LAB;  Service: Cardiovascular;  Laterality: N/A;  . Graft(s) angiogram  06/10/2011    Procedure: GRAFT(S) Cyril Loosen;  Surgeon: Minus Breeding, MD;  Location: [facility] CATH LAB;  Service: Cardiovascular;;  . Percutaneous coronary stent intervention (pci-s) N/A 06/11/2011    Procedure: PERCUTANEOUS CORONARY STENT INTERVENTION (PCI-S);  Surgeon: Peter M Martinique, MD;  Location: [facility] CATH LAB;  Service: Cardiovascular;  Laterality: N/A;  . Implantable cardioverter defibrillator implant N/A 12/11/2011    Procedure: IMPLANTABLE CARDIOVERTER DEFIBRILLATOR IMPLANT;  Surgeon: Deboraha Sprang, MD;  Location: [facility] CATH LAB;  Service: Cardiovascular;  Laterality: N/A;  . Left and right heart catheterization with coronary/graft angiogram N/A 10/20/2013    Procedure: LEFT AND RIGHT HEART CATHETERIZATION WITH Beatrix Fetters;  Surgeon: Troy Sine, MD;  Location: [facility] CATH LAB;  Service: Cardiovascular;  Laterality: N/A;     Current Outpatient Prescriptions  Medication Sig Dispense Refill  . aspirin EC 81 MG tablet Take 81 mg by mouth daily.     Marland Kitchen atorvastatin (LIPITOR) 40 MG tablet TAKE ONE TABLET BY MOUTH ONCE DAILY 90 tablet 0  . budesonide-formoterol (SYMBICORT) 160-4.5 MCG/ACT inhaler Inhale 2 puffs into the lungs 2 (two) times daily. 1 Inhaler 12  . clopidogrel (PLAVIX) 75 MG tablet TAKE ONE TABLET BY MOUTH ONCE DAILY 90 tablet 3  . furosemide (LASIX) 40 MG tablet Take 1 tablet (40 mg total) by mouth 2 (two) times daily. 180 tablet 0  . Insulin Glargine (LANTUS SOLOSTAR) 100 UNIT/ML Solostar Pen Inject 20 Units into the skin daily at 10 pm. 15 mL 11  . insulin regular (HUMULIN R) 100 units/mL injection Inject 0.03 mLs (3 Units total) into the skin 3 (three) times daily before meals. 10 mL 11  . Insulin Syringes, Disposable, U-100 0.3 ML MISC 1 Syringe by Does not apply route 3 (three) times daily. 1 each 0  . metoprolol succinate  (TOPROL-XL) 25 MG 24 hr tablet TAKE ONE TABLET BY MOUTH ONCE DAILY 30 tablet 0  . NEEDLE, DISP, 22 G (B-D DISP NEEDLE 22GX3/4") 22G X 3/4" MISC 1 Syringe by Does not apply route daily. 1 each 0  . nitroGLYCERIN (NITROSTAT) 0.4 MG SL tablet Place 1 tablet (0.4 mg total) under the tongue every 5 (five) minutes as needed for chest pain. For chest pain 25 tablet 6   No current facility-administered medications for this visit.    Allergies:  Review of patient's allergies indicates no known allergies.    Social History:  The patient  reports that he has quit smoking. His smoking use included Cigarettes. He started smoking about 2 years ago. He has a 25 pack-year smoking history. He has never used smokeless tobacco. He reports that he does not drink alcohol or use illicit drugs.   Family History:  The patient's family history includes Breast cancer in his mother; Diabetes in his father; Heart attack in his father.    ROS:  Please see the history of present illness.    Review of Systems: Constitutional:  denies fever, chills, diaphoresis, appetite change and fatigue.  HEENT: denies photophobia, eye pain, redness, hearing loss, ear pain, congestion, sore throat, rhinorrhea, sneezing, neck pain, neck stiffness and tinnitus.  Respiratory: denies SOB, DOE, cough, chest tightness, and wheezing.  Cardiovascular: denies chest pain, palpitations.  He has significant leg swelling an weeping from multiple areas from his legs.   Gastrointestinal: denies nausea, vomiting, abdominal pain, diarrhea, constipation, blood in stool.  Genitourinary: denies dysuria, urgency, frequency, hematuria, flank pain and difficulty urinating.  Musculoskeletal: denies  myalgias, back pain, joint swelling, arthralgias and gait problem.   Skin: denies pallor, rash and wound.  Neurological: denies dizziness, seizures, syncope, weakness, light-headedness, numbness and headaches.   Hematological: denies adenopathy, easy bruising,  personal or family bleeding history.  Psychiatric/ Behavioral: denies suicidal ideation, mood changes, confusion, nervousness, sleep disturbance and agitation.       All other systems are reviewed and negative.    PHYSICAL EXAM: VS:  BP 82/56 mmHg  Pulse 87  Ht 5\' 10"  (1.778 m)  Wt 145 lb (65.772 kg)  BMI 20.81 kg/m2 , BMI Body mass index is 20.81 kg/(m^2). GEN: chroniclly ill appearing gentleman HEENT: normal Neck: no JVD, carotid bruits, or masses Cardiac: RRR; no murmurs, rubs, or gallops,  3+ weeping edema  Respiratory:  Decreased breath sound bilaterally  GI: soft, nontender, nondistended, + BS MS: markedly swollen legs  , chronic stasis changes. He has a deep ulceration of his left lower leg.  Skin: warm and dry, no rash Neuro:  Strength and sensation are intact, hard of hearing Psych: normal   EKG:  EKG is ordered today. The ekg ordered today demonstrates NSR at 87.     Recent Labs: 10/15/2013: Pro B Natriuretic peptide (BNP) 2626.0* 10/16/2013: ALT 12 10/21/2013: Hemoglobin 14.3; Platelets 100* 10/27/2013: BUN 24*; Creatinine 1.05; Potassium 4.4; Sodium 139    Lipid Panel    Component Value Date/Time   CHOL 71 04/22/2013 0455   TRIG 54 04/22/2013 0455   HDL 36* 04/22/2013 0455   CHOLHDL 2.0 04/22/2013 0455   VLDL 11 04/22/2013 0455   LDLCALC 24 04/22/2013 0455      Wt Readings from Last 3 Encounters:  08/30/14 145 lb (65.772 kg)  12/30/13 143 lb (64.864 kg)  11/25/13 142 lb 6.4 oz (64.592 kg)      Other studies Reviewed: Additional studies/ records that were reviewed today include: . Review of the above records demonstrates:    ASSESSMENT AND PLAN:  1. CAD - CABG 1996, s/p DES 2012 - he's not having any CP and denies any increased dyspnea.  He has COPD  2. CHF - EF 25-30% - He's hypotensive and has a right-sided S3 gallop. I suspect that his problems are more related to right-sided heart failure/pulmonary hypertension which is due to his severe  COPD.  I've recommended that he proceed to the hospital. I'm not sure  that there is much that we can do for him as an outpatient. His blood pressures is low and I worry that additional diuresis would result in further decrease of his blood pressure. 3. AICD placement - followed by Dr. Caryl Comes  4.  Pulmonary Hypertension -  I've encouraged him to stop smoking.  I suspect that his leg edema is primarily due to his severe COPD and subsequent pulmonary hypertension. 5. Ongoing cigarette abuse 6. Chronic leg edema - due to pylmonary hypertension 7. COPD -  8. 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 He has weeping edema. He has several open wounds on his left lower leg. One of the wounds is very deep and I've recommended that he go to the hospital and have that evaluated. He's hypotensive and has a right-sided S3 gallop  Current medicines are reviewed at length with the patient today.  The patient does not have concerns regarding medicines.  The following changes have been made:  I have recommended that he go to the hospital for evaluation .  h   Disposition:   FU with me in 3 months    Signed, Nahser, Wonda Cheng, MD  08/30/2014 3:31 PM    [facility] [address] [place] [address], [place], [place]  [address] Phone: [phone]; Fax: [phone]

## 2014-08-30 NOTE — ED Notes (Signed)
Pt reports BLE leg swelling and legs weeping for several months. Denies cp or SOB. Was at Dr. Cathie Olden office, sent here. Pt is a x 4

## 2014-08-30 NOTE — H&P (Addendum)
Hospitalist Admission History and Physical  Patient name: James Moon Medical record number: [id number] Date of birth: 12-16-1945 Age: 69 y.o. Gender: male  Primary Care Provider: Lamar Blinks, MD  Chief Complaint: LE swelling, LE rash, AKI   History of Present Illness:This is a 69 y.o. year old male with significant past medical history of CAD, ischemic cardiomyopathy w/p pacemaker, COPD GOLD III, chronic edema  presenting with LE swelling, LE rash, AKI. Pt was evaluated by his cardiologist today with noted progressive worsening LE swelling in setting of R sided heart failure 2/2 COPD. Pt was noted to have LLN BPs. Felt that pt may not have been able to tolerate outpt diuresis. Also w/ chronic LE rash and ulcerations w/ secondary weeping.  Pt states that LE swelling has been chronic but has acutely worsened over the past 2-3 weeks. Patient does admit to taking Aleve PM to help with sleep over the past 3-4 weeks. Denies any fevers or chills. Has chronic respiratory failure in the setting of COPD and right-sided heart failure. This is been fairly stable. Blood sugars have been stable at home per patient. Presented to ER temperature 90.8, heart rate in the 80s to 100s, respirations and intense 20s, blood pressure in the 80s to 110s, satting 92-100% on 2-3 L nasal cannula. White blood cell count 6, hemoglobin 12.5, creatinine 1.75. Blood sugar 361. Chest x-ray shows mild CHF. Lower extremity imaging of the left leg including left foot, left tib-fib, left ankle within normal limits. No evidence of osteomyelitis. Formal cardiology consult pending  Assessment and Plan:  Active Problems:   Leg swelling   AKI (acute kidney injury)   Rash   1- LE swelling -acute on chronic issue in setting of R heart failure/baseline ischemic cardiomyopathy s/p AICD  -suspect recent extended NSAID use a likely precipitant -formal cards consult pending -2d ECHO  -noted overlapping venous stasis  ulcers -preliminary LE dopplers negative for DVT per EDPA  -f/u cards recs   2-Rash -noted acute on chronic venous stasis ulcers w/ worsening weeping -no evidence of infection clinically-afebrile, no leukocytosis, xrays WNL  -nontender -ESR  -wound care consult  -would likely benefit from [facility] boot placement   3-AKI -likely prerenal in etiology in setting of diuretic and NSAID use -hold NSAIDs -renal u/s  -diuresis per cards recs  -follow  4-IDDM -SSI  -A1C -lantus  5- CAD -no active CP  -tele bed  6-Chronic resp failure -stable on 2-3 L in setting of COPD -no resp distress/wheezing -cont home regimen  -follow  7-Ischemic Cardiomyopathy/R heart failure -2D ECHO 09/2013 w/ EF 71-06%, grade 1 diastolic dysfunction -PA pressure 58 mmHg -noted LE swelling -f/u cardiology recs   FEN/GI: heart healthy-carb modified diet-fluid restriction  Prophylaxis: sub q heparin  Disposition: pending further evaluation  Code Status:Full Code    Patient Active Problem List   Diagnosis Date Noted  . Leg swelling 08/30/2014  . NSTEMI (non-ST elevated myocardial infarction) 10/21/2013  . Chronic combined systolic and diastolic CHF (congestive heart failure) 10/19/2013  . Hypotension 10/18/2013  . Hypoxemia 10/15/2013  . CHF exacerbation 10/15/2013  . Smoker 05/12/2013  . Acute on chronic systolic CHF (congestive heart failure) 04/22/2013  . Chronic respiratory failure 04/22/2013  . Edema, peripheral 12/09/2012  . 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 Dyspnea 06/26/2011  . COPD GOLD III 06/13/2011  . CAD (coronary artery disease) 06/07/2011  . Chronic systolic heart failure 26/94/8546  . IDDM (insulin dependent diabetes mellitus) 06/07/2011  . Ischemic cardiomyopathy 06/01/2011  Past Medical History: Past Medical History  Diagnosis Date  . Diabetes mellitus   . Colon cancer     s/p colectomy   . Hypertension   . CAD (coronary artery disease)     a. CABG in  1996. b. NSTEMI 06/2011: DES x 2 to SVG to 1st and 2nd OM and SVG to PDA. c. 09/2013: NSTEMI s/p DES to prox SVG-OM1, NCPTCA of mSVG-OM1.  . Ischemic cardiomyopathy Dec. 2012    a. EF 20 to 25% 2012. b. 30-35% echo 3/13. c. EF 40-45% by echo 09/2013 but then at time of NSTEMI was 20% by cath later that month.  . Tobacco abuse   . COPD (chronic obstructive pulmonary disease)   . Chronic systolic CHF (congestive heart failure)   . Chronic respiratory failure     a. On home O2 - due to COPD.  Marland Kitchen NSVT (nonsustained ventricular tachycardia)   . Hypotension   . AICD ([address] [place])   . Thrombocytopenia   . Pulmonary HTN   . Syncope     a. 03/2013 in setting of hypotension.    Past Surgical History: Past Surgical History  Procedure Laterality Date  . Colon surgery    . Open heart surgery  01/1995  . Coronary stent placement  Dec. 2012    DES to SVG to 1st and 2nd OM and SVG to the PDA  . Coronary artery bypass graft  01/1995  . Coronary angioplasty    . Colon surgery      FOR HISTORY OF COLON CANCER  . Left heart catheterization with coronary angiogram N/A 06/10/2011    Procedure: LEFT HEART CATHETERIZATION WITH CORONARY ANGIOGRAM;  Surgeon: Minus Breeding, MD;  Location: [facility] CATH LAB;  Service: Cardiovascular;  Laterality: N/A;  . Graft(s) angiogram  06/10/2011    Procedure: GRAFT(S) Cyril Loosen;  Surgeon: Minus Breeding, MD;  Location: [facility] CATH LAB;  Service: Cardiovascular;;  . Percutaneous coronary stent intervention (pci-s) N/A 06/11/2011    Procedure: PERCUTANEOUS CORONARY STENT INTERVENTION (PCI-S);  Surgeon: Peter M Martinique, MD;  Location: [facility] CATH LAB;  Service: Cardiovascular;  Laterality: N/A;  . Implantable cardioverter defibrillator implant N/A 12/11/2011    Procedure: IMPLANTABLE CARDIOVERTER DEFIBRILLATOR IMPLANT;  Surgeon: Deboraha Sprang, MD;  Location: [facility] CATH LAB;  Service: Cardiovascular;  Laterality: N/A;  . Left and right heart catheterization with coronary/graft angiogram N/A  10/20/2013    Procedure: LEFT AND RIGHT HEART CATHETERIZATION WITH Beatrix Fetters;  Surgeon: Troy Sine, MD;  Location: [facility] CATH LAB;  Service: Cardiovascular;  Laterality: N/A;    Social History: History   Social History  . Marital Status: Widowed    Spouse Name: N/A  . Number of Children: N/A  . Years of Education: N/A   Social History Main Topics  . Smoking status: Former Smoker -- 0.50 packs/day for 50 years    Types: Cigarettes    Start date: 02/26/2012  . Smokeless tobacco: Never Used     Comment: he has tried to quit many times.  . Alcohol Use: No  . Drug Use: No  . Sexual Activity: Not on file   Other Topics Concern  . None   Social History Narrative   Lives in [place], has 1 child.  He is widowed .  He works at [employer].    Family History: Family History  Problem Relation Age of Onset  . Heart attack Father   . Diabetes Father   . Breast cancer Mother     Allergies: No  Known Allergies  Current Facility-Administered Medications  Medication Dose Route Frequency Provider Last Rate Last Dose  . 0.9 %  sodium chloride infusion  250 mL Intravenous PRN Shanda Howells, MD      . aspirin EC tablet 81 mg  81 mg Oral Daily Shanda Howells, MD      . atorvastatin (LIPITOR) tablet 40 mg  40 mg Oral Daily Shanda Howells, MD      . budesonide-formoterol [facility]) 160-4.5 MCG/ACT inhaler 2 puff  2 puff Inhalation BID Shanda Howells, MD      . clopidogrel (PLAVIX) tablet 75 mg  75 mg Oral Daily Shanda Howells, MD      . heparin injection 5,000 Units  5,000 Units Subcutaneous 3 times per day Shanda Howells, MD      . insulin aspart (novoLOG) injection 0-9 Units  0-9 Units Subcutaneous 6 times per day Shanda Howells, MD      . Insulin Glargine (LANTUS) Solostar Pen 20 Units  20 Units Subcutaneous Q2200 Shanda Howells, MD      . nitroGLYCERIN (NITROSTAT) SL tablet 0.4 mg  0.4 mg Sublingual Q5 min PRN Shanda Howells, MD      . sodium chloride 0.9 % injection 3 mL  3 mL  Intravenous Q12H Shanda Howells, MD      . sodium chloride 0.9 % injection 3 mL  3 mL Intravenous Q12H Shanda Howells, MD      . sodium chloride 0.9 % injection 3 mL  3 mL Intravenous PRN Shanda Howells, MD       Current Outpatient Prescriptions  Medication Sig Dispense Refill  . aspirin EC 81 MG tablet Take 81 mg by mouth daily.     Marland Kitchen atorvastatin (LIPITOR) 40 MG tablet TAKE ONE TABLET BY MOUTH ONCE DAILY 90 tablet 0  . budesonide-formoterol (SYMBICORT) 160-4.5 MCG/ACT inhaler Inhale 2 puffs into the lungs 2 (two) times daily. 1 Inhaler 12  . clopidogrel (PLAVIX) 75 MG tablet TAKE ONE TABLET BY MOUTH ONCE DAILY 90 tablet 3  . furosemide (LASIX) 40 MG tablet Take 1 tablet (40 mg total) by mouth 2 (two) times daily. 180 tablet 0  . Insulin Glargine (LANTUS SOLOSTAR) 100 UNIT/ML Solostar Pen Inject 20 Units into the skin daily at 10 pm. (Patient taking differently: Inject 20-24 Units into the skin every morning. ) 15 mL 11  . insulin NPH-regular Human (NOVOLIN 70/30) (70-30) 100 UNIT/ML injection Inject 3 Units into the skin every 8 (eight) hours.    . Insulin Syringes, Disposable, U-100 0.3 ML MISC 1 Syringe by Does not apply route 3 (three) times daily. 1 each 0  . metoprolol succinate (TOPROL-XL) 25 MG 24 hr tablet Take 1 tablet (25 mg total) by mouth daily. 30 tablet 10  . Naproxen Sodium (ALEVE PO) Take 220 mg by mouth every 8 (eight) hours as needed (PAIN).    Marland Kitchen NEEDLE, DISP, 22 G (B-D DISP NEEDLE 22GX3/4") 22G X 3/4" MISC 1 Syringe by Does not apply route daily. 1 each 0  . nitroGLYCERIN (NITROSTAT) 0.4 MG SL tablet Place 1 tablet (0.4 mg total) under the tongue every 5 (five) minutes as needed for chest pain. For chest pain 25 tablet 6   Review Of Systems: 12 point ROS negative except as noted above in HPI.  Physical Exam: Filed Vitals:   08/30/14 2045  BP: 104/53  Pulse: 90  Temp:   Resp: 14    General: alert, cooperative and cachectic HEENT: PERRLA and extra ocular movement  intact Heart:  S1, S2 normal, no murmur, rub or gallop, regular rate and rhythm Lungs: unlabored breathing and expiratory wheezes Abdomen: abdomen is soft without significant tenderness, masses, organomegaly or guarding Extremities: venous stasis dermatitis noted and + stable lesions on anterior shin w/ serous drainage-non tender-no warmth to touch  Skin:as above  Neurology: normal without focal findings  Labs and Imaging: Lab Results  Component Value Date/Time   NA 135 08/30/2014 05:12 PM   K 4.5 08/30/2014 05:12 PM   CL 92* 08/30/2014 05:12 PM   CO2 32 08/30/2014 05:12 PM   BUN 44* 08/30/2014 05:12 PM   CREATININE 1.75* 08/30/2014 05:12 PM   CREATININE 1.05 10/27/2013 11:46 AM   GLUCOSE 361* 08/30/2014 05:12 PM   Lab Results  Component Value Date   WBC 6.0 08/30/2014   HGB 12.5* 08/30/2014   HCT 42.8 08/30/2014   MCV 82.3 08/30/2014   PLT 129* 08/30/2014    Dg Chest 2 View  08/30/2014   CLINICAL DATA:  Leg swelling  EXAM: CHEST  2 VIEW  COMPARISON:  10/18/2013  FINDINGS: Stable positioning of right ventricular ICD/pacer lead.  Borderline cardiomegaly. The patient is status post CABG and coronary artery stenting. There is slight deviation of the trachea to the left, likely related to leftward rotation. Interlobular septal thickening with Kerley lines and trace pleural effusions. No convincing pneumonia.  IMPRESSION: Mild CHF.   Electronically Signed   By: Monte Fantasia M.D.   On: 08/30/2014 21:21   Dg Tibia/fibula Left  08/30/2014   CLINICAL DATA:  Bilateral leg swelling. Initial encounter. Symptoms for several months.  EXAM: LEFT TIBIA AND FIBULA - 2 VIEW  COMPARISON:  None.  FINDINGS: Proximal tibia and fibula appear within normal limits. Atherosclerosis small vessel atherosclerosis is present.  IMPRESSION: Negative.   Electronically Signed   By: Dereck Ligas M.D.   On: 08/30/2014 19:31   Dg Ankle Complete Left  08/30/2014   CLINICAL DATA:  Lower extremity edema for several  months ; diabetes mellitus.  EXAM: LEFT ANKLE COMPLETE - 3+ VIEW  COMPARISON:  None.  FINDINGS: Frontal, oblique, and lateral views were obtained. There is no fracture or effusion. No erosive change or bony destruction. Ankle mortise appears intact. There is extensive arterial vascular calcification.  IMPRESSION: No fracture. No bony destruction. Ankle mortise appears intact. Extensive arterial vascular calcification.   Electronically Signed   By: Lowella Grip III M.D.   On: 08/30/2014 19:34   Dg Foot Complete Left  08/30/2014   CLINICAL DATA:  Bilateral lower extremity leg swelling.  EXAM: LEFT FOOT - COMPLETE 3+ VIEW  COMPARISON:  Left ankle 08/30/2014  FINDINGS: Osteopenia in the forefoot. Evidence for vascular calcifications. Alignment of the foot is normal. Negative for a fracture or dislocation. Soft tissues are prominent along the dorsum of the foot.  IMPRESSION: No acute bone abnormality.   Electronically Signed   By: Markus Daft M.D.   On: 08/30/2014 19:28           Shanda Howells MD  Pager: [phone]

## 2014-08-30 NOTE — ED Notes (Signed)
When hooking pt up to monitor, pt O2 sats 84% on RA. Pt placed on 2 L, sats up to 96%

## 2014-08-30 NOTE — ED Notes (Addendum)
Pt in vascular at this time having ultrasound completed.

## 2014-08-30 NOTE — Progress Notes (Signed)
*  Preliminary Results* Bilateral lower extremity venous duplex completed. Bilateral lower extremities are negative for deep vein thrombosis. There is no evidence of Baker's cyst bilaterally.  08/30/2014  Maudry Mayhew, RVT, RDCS, RDMS

## 2014-08-30 NOTE — Patient Instructions (Addendum)
Your physician recommends that you continue on your current medications as directed. Please refer to the Current Medication list given to you today.  Your physician recommends that you go to the [facility] Emergency Department for evaluation and possible admission by [facility]  Your physician recommends that you schedule a follow-up appointment in: 3 months with Dr. Acie Fredrickson

## 2014-08-30 NOTE — ED Notes (Signed)
Pt returned from xray

## 2014-08-30 NOTE — ED Provider Notes (Signed)
CSN: [id number]     Arrival date & time 08/30/14  1659 History   First MD Initiated Contact with Patient 08/30/14 1718     Chief Complaint  Patient presents with  . Leg Swelling     (Consider location/radiation/quality/duration/timing/severity/associated sxs/prior Treatment) HPI Pt is a 69yo male sent to ED by his cardiologist, Dr. Cathie Olden, for further evaluation of bilateral lower leg edema and hypotension, Dr. Cathie Olden concerned for right sided heart failure.  Pt does deny chest pain or SOB at this time but reports gradually worsening leg swelling and skin changes including redness of skin, oozing clear-yellow discharge and a wound to his left lower leg for 3-4 weeks.  He also repors mild intermittent bilateral leg cramping.  He currently takes lasix 40mg  BID, as well as a 81mg  baby aspirin. States he has not been seen by a medical provider since onset of leg swelling until today.  He has not been to wound care or on antibiotics. Pt is a Type 1 diabetic.  Denies fever, chills, n/v/d. Pt is on Plavix.  Past Medical History  Diagnosis Date  . Diabetes mellitus   . Colon cancer     s/p colectomy   . Hypertension   . CAD (coronary artery disease)     a. CABG in 1996. b. NSTEMI 06/2011: DES x 2 to SVG to 1st and 2nd OM and SVG to PDA. c. 09/2013: NSTEMI s/p DES to prox SVG-OM1, NCPTCA of mSVG-OM1.  . Ischemic cardiomyopathy Dec. 2012    a. EF 20 to 25% 2012. b. 30-35% echo 3/13. c. EF 40-45% by echo 09/2013 but then at time of NSTEMI was 20% by cath later that month.  . Tobacco abuse   . COPD (chronic obstructive pulmonary disease)   . Chronic systolic CHF (congestive heart failure)   . Chronic respiratory failure     a. On home O2 - due to COPD.  Marland Kitchen NSVT (nonsustained ventricular tachycardia)   . Hypotension   . AICD ([address] [place])   . Thrombocytopenia   . Pulmonary HTN   . Syncope     a. 03/2013 in setting of hypotension.   Past Surgical History  Procedure Laterality Date  . Colon surgery     . Open heart surgery  01/1995  . Coronary stent placement  Dec. 2012    DES to SVG to 1st and 2nd OM and SVG to the PDA  . Coronary artery bypass graft  01/1995  . Coronary angioplasty    . Colon surgery      FOR HISTORY OF COLON CANCER  . Left heart catheterization with coronary angiogram N/A 06/10/2011    Procedure: LEFT HEART CATHETERIZATION WITH CORONARY ANGIOGRAM;  Surgeon: Minus Breeding, MD;  Location: [facility] CATH LAB;  Service: Cardiovascular;  Laterality: N/A;  . Graft(s) angiogram  06/10/2011    Procedure: GRAFT(S) Cyril Loosen;  Surgeon: Minus Breeding, MD;  Location: [facility] CATH LAB;  Service: Cardiovascular;;  . Percutaneous coronary stent intervention (pci-s) N/A 06/11/2011    Procedure: PERCUTANEOUS CORONARY STENT INTERVENTION (PCI-S);  Surgeon: Peter M Martinique, MD;  Location: [facility] CATH LAB;  Service: Cardiovascular;  Laterality: N/A;  . Implantable cardioverter defibrillator implant N/A 12/11/2011    Procedure: IMPLANTABLE CARDIOVERTER DEFIBRILLATOR IMPLANT;  Surgeon: Deboraha Sprang, MD;  Location: [facility] CATH LAB;  Service: Cardiovascular;  Laterality: N/A;  . Left and right heart catheterization with coronary/graft angiogram N/A 10/20/2013    Procedure: LEFT AND RIGHT HEART CATHETERIZATION WITH Beatrix Fetters;  Surgeon: Troy Sine,  MD;  Location: [facility] CATH LAB;  Service: Cardiovascular;  Laterality: N/A;   Family History  Problem Relation Age of Onset  . Heart attack Father   . Diabetes Father   . Breast cancer Mother    History  Substance Use Topics  . Smoking status: Former Smoker -- 0.50 packs/day for 50 years    Types: Cigarettes    Start date: 02/26/2012  . Smokeless tobacco: Never Used     Comment: he has tried to quit many times.  . Alcohol Use: No    Review of Systems  Constitutional: Negative for fever, chills and fatigue.  Respiratory: Negative for cough and shortness of breath.   Cardiovascular: Positive for leg swelling. Negative for chest pain and  palpitations.  Gastrointestinal: Negative for nausea, vomiting, abdominal pain and diarrhea.  Genitourinary: Negative for dysuria, frequency and flank pain.  Skin: Positive for color change and wound.  Neurological: Negative for weakness and numbness.  All other systems reviewed and are negative.     Allergies  Review of patient's allergies indicates no known allergies.  Home Medications   Prior to Admission medications   Medication Sig Start Date End Date Taking? Authorizing Provider  aspirin EC 81 MG tablet Take 81 mg by mouth daily.    Yes Historical Provider, MD  atorvastatin (LIPITOR) 40 MG tablet TAKE ONE TABLET BY MOUTH ONCE DAILY 07/05/14  Yes Thayer Headings, MD  budesonide-formoterol [facility]) 160-4.5 MCG/ACT inhaler Inhale 2 puffs into the lungs 2 (two) times daily. 10/22/13  Yes Olam Idler, MD  clopidogrel (PLAVIX) 75 MG tablet TAKE ONE TABLET BY MOUTH ONCE DAILY 04/11/14  Yes Thayer Headings, MD  furosemide (LASIX) 40 MG tablet Take 1 tablet (40 mg total) by mouth 2 (two) times daily. 07/06/14  Yes Belva Crome III, MD  Insulin Glargine (LANTUS SOLOSTAR) 100 UNIT/ML Solostar Pen Inject 20 Units into the skin daily at 10 pm. Patient taking differently: Inject 20-24 Units into the skin every morning.  10/22/13  Yes Olam Idler, MD  insulin NPH-regular Human (NOVOLIN 70/30) (70-30) 100 UNIT/ML injection Inject 3 Units into the skin every 8 (eight) hours.   Yes Historical Provider, MD  Insulin Syringes, Disposable, U-100 0.3 ML MISC 1 Syringe by Does not apply route 3 (three) times daily. 10/22/13  Yes Olam Idler, MD  metoprolol succinate (TOPROL-XL) 25 MG 24 hr tablet Take 1 tablet (25 mg total) by mouth daily. 08/30/14  Yes Thayer Headings, MD  Naproxen Sodium (ALEVE PO) Take 220 mg by mouth every 8 (eight) hours as needed (PAIN).   Yes Historical Provider, MD  NEEDLE, DISP, 22 G (B-D DISP NEEDLE 22GX3/4") 22G X 3/4" MISC 1 Syringe by Does not apply route daily. 10/22/13   Yes Olam Idler, MD  nitroGLYCERIN (NITROSTAT) 0.4 MG SL tablet Place 1 tablet (0.4 mg total) under the tongue every 5 (five) minutes as needed for chest pain. For chest pain 08/22/14   Deboraha Sprang, MD   BP 111/65 mmHg  Pulse 105  Temp(Src) 97.8 F (36.6 C) (Oral)  Resp 18  Ht 5\' 10"  (1.778 m)  Wt 151 lb 10.8 oz (68.8 kg)  BMI 21.76 kg/m2  SpO2 95% Physical Exam  Constitutional: He appears well-developed and well-nourished.  Pt lying comfortably in exam bed, NAD.   HENT:  Head: Normocephalic and atraumatic.  Eyes: Conjunctivae are normal. No scleral icterus.  Neck: Normal range of motion.  Cardiovascular: Normal rate, regular rhythm and  normal heart sounds.   Regular rate and rhythm  Pulmonary/Chest: Effort normal and breath sounds normal. No respiratory distress. He has no wheezes. He has no rales. He exhibits no tenderness.  No respiratory distress, able to speak in full sentences w/o difficulty. Lungs: CTAB  Abdominal: Soft. Bowel sounds are normal. He exhibits no distension and no mass. There is no tenderness. There is no rebound and no guarding.  Musculoskeletal: Normal range of motion. He exhibits edema and tenderness.  Bilateral lower extremities: 3 to 4+ pitting edema to inferior aspect of both knees.  Mild tenderness to bilateral calves and feet. See skin exam for further detail.  Neurological: He is alert.  Skin: Skin is warm and dry. There is erythema.  Bilateral lower extremities: significant erythema of lower extremities, scattered ecchymosis and petechiae, most noticeable of right lower extremity.  Both legs weeping/oozing clear to yellow discharge, more prominent on right leg.   Left lower extremity: stage 2 ulceration on later distal aspect of lower leg.       Nursing note and vitals reviewed.          ED Course  Procedures (including critical care time) Labs Review Labs Reviewed  CBC - Abnormal; Notable for the following:    Hemoglobin 12.5 (*)     MCH 24.0 (*)    MCHC 29.2 (*)    RDW 20.2 (*)    Platelets 129 (*)    All other components within normal limits  BASIC METABOLIC PANEL - Abnormal; Notable for the following:    Chloride 92 (*)    Glucose, Bld 361 (*)    BUN 44 (*)    Creatinine, Ser 1.75 (*)    GFR calc non Af Amer 38 (*)    GFR calc Af Amer 44 (*)    All other components within normal limits  BRAIN NATRIURETIC PEPTIDE - Abnormal; Notable for the following:    B Natriuretic Peptide 547.1 (*)    All other components within normal limits  CBC - Abnormal; Notable for the following:    Hemoglobin 12.9 (*)    MCH 24.1 (*)    MCHC 29.6 (*)    RDW 20.3 (*)    Platelets 122 (*)    All other components within normal limits  HEMOGLOBIN A1C  CREATININE, SERUM  COMPREHENSIVE METABOLIC PANEL  CBC WITH DIFFERENTIAL/PLATELET  TROPONIN I  TROPONIN I  TROPONIN I  SEDIMENTATION RATE  I-STAT TROPOININ, ED    Imaging Review Dg Chest 2 View  08/30/2014   CLINICAL DATA:  Leg swelling  EXAM: CHEST  2 VIEW  COMPARISON:  10/18/2013  FINDINGS: Stable positioning of right ventricular ICD/pacer lead.  Borderline cardiomegaly. The patient is status post CABG and coronary artery stenting. There is slight deviation of the trachea to the left, likely related to leftward rotation. Interlobular septal thickening with Kerley lines and trace pleural effusions. No convincing pneumonia.  IMPRESSION: Mild CHF.   Electronically Signed   By: Monte Fantasia M.D.   On: 08/30/2014 21:21   Dg Tibia/fibula Left  08/30/2014   CLINICAL DATA:  Bilateral leg swelling. Initial encounter. Symptoms for several months.  EXAM: LEFT TIBIA AND FIBULA - 2 VIEW  COMPARISON:  None.  FINDINGS: Proximal tibia and fibula appear within normal limits. Atherosclerosis small vessel atherosclerosis is present.  IMPRESSION: Negative.   Electronically Signed   By: Dereck Ligas M.D.   On: 08/30/2014 19:31   Dg Ankle Complete Left  08/30/2014   CLINICAL DATA:  Lower extremity  edema for several months ; diabetes mellitus.  EXAM: LEFT ANKLE COMPLETE - 3+ VIEW  COMPARISON:  None.  FINDINGS: Frontal, oblique, and lateral views were obtained. There is no fracture or effusion. No erosive change or bony destruction. Ankle mortise appears intact. There is extensive arterial vascular calcification.  IMPRESSION: No fracture. No bony destruction. Ankle mortise appears intact. Extensive arterial vascular calcification.   Electronically Signed   By: Lowella Grip III M.D.   On: 08/30/2014 19:34   Dg Foot Complete Left  08/30/2014   CLINICAL DATA:  Bilateral lower extremity leg swelling.  EXAM: LEFT FOOT - COMPLETE 3+ VIEW  COMPARISON:  Left ankle 08/30/2014  FINDINGS: Osteopenia in the forefoot. Evidence for vascular calcifications. Alignment of the foot is normal. Negative for a fracture or dislocation. Soft tissues are prominent along the dorsum of the foot.  IMPRESSION: No acute bone abnormality.   Electronically Signed   By: Markus Daft M.D.   On: 08/30/2014 19:28     EKG Interpretation   Date/Time:  Tuesday August 30 2014 17:51:37 EST Ventricular Rate:  92 PR Interval:  133 QRS Duration: 131 QT Interval:  372 QTC Calculation: 460 R Axis:   -86 Text Interpretation:  Sinus rhythm Ventricular premature complex  Nonspecific IVCD with LAD LVH with secondary repolarization abnormality No  significant change since last tracing Confirmed by HARRISON  MD, FORREST  ([id number]) on 08/30/2014 6:04:28 PM      MDM   Final diagnoses:  Leg swelling  Rash, skin  Acute kidney injury    Pt is a 69yo male sent to ED by Dr. Cathie Olden, cardiology, for further evaluation of bilateral leg edema for 1 month.  Pt denies CP or SOB, however, Dr. Cathie Olden concerned for worsening right sided heart failure. Also concerned to increased pt's lasix as pt's BP low during office visit. Pt states he feels well besides mild intermittent bilateral leg cramping.  Significant bilateral leg edema with concern for  possible underlying infection, however, pt is afebrile.   Labs: no leukocytosis.  Troponin negative for elevation.  Mildly elevated BNP of 547, no old to compare.   8:25 PM Consulted with cardiology who agreed to come examine pt.  CXR pending as pt's O2 Sat dropped to 84%.  Pt placed on 2L [place], O2 Sat increased to 96%.  Pt still denies chest pain or SOB.  Pt is requesting pain medication for his legs. Pain is 7/10. Morphine 4mg  ordered.   Discussed pt with Dr. Aline Brochure who also examined pt.  Recommends admitting pt due to failed out patient diuresis, hypoxia in ED, as well as possible cellulitis of bilateral lower extremities, left leg will likely require wound care.  Holding off on starting antibiotics as unsure of underlying cellulitis due to lack of leukocytosis, pt afebrile.  Skin changes could be chronic due to PVD.  DP pulses 2+. Will discussed with admitting provider.   BP has steadily improved from 82/56 initially to 114/66 while in ED.  PCP: Dr. Lamar Blinks, Family Medicine.   9:08 PM Consulted with Dr. Ernestina Patches, [facility], as family medicine is capped.  Agreed to come evaluate pt for admission.  Pt will bed admitted to a tele bed.     Noland Fordyce, PA-C 08/30/14 [place], MD 09/01/14 1058

## 2014-08-31 ENCOUNTER — Encounter (HOSPITAL_COMMUNITY): Payer: Self-pay | Admitting: General Practice

## 2014-08-31 DIAGNOSIS — N179 Acute kidney failure, unspecified: Secondary | ICD-10-CM

## 2014-08-31 DIAGNOSIS — R0600 Dyspnea, unspecified: Secondary | ICD-10-CM

## 2014-08-31 DIAGNOSIS — R600 Localized edema: Secondary | ICD-10-CM

## 2014-08-31 DIAGNOSIS — Z794 Long term (current) use of insulin: Secondary | ICD-10-CM

## 2014-08-31 DIAGNOSIS — I5042 Chronic combined systolic (congestive) and diastolic (congestive) heart failure: Secondary | ICD-10-CM

## 2014-08-31 DIAGNOSIS — E119 Type 2 diabetes mellitus without complications: Secondary | ICD-10-CM

## 2014-08-31 LAB — CBC WITH DIFFERENTIAL/PLATELET
Basophils Absolute: 0 10*3/uL (ref 0.0–0.1)
Basophils Relative: 0 % (ref 0–1)
EOS ABS: 0.1 10*3/uL (ref 0.0–0.7)
EOS PCT: 2 % (ref 0–5)
HCT: 44.6 % (ref 39.0–52.0)
Hemoglobin: 12.5 g/dL — ABNORMAL LOW (ref 13.0–17.0)
Lymphocytes Relative: 16 % (ref 12–46)
Lymphs Abs: 1.2 10*3/uL (ref 0.7–4.0)
MCH: 23.5 pg — ABNORMAL LOW (ref 26.0–34.0)
MCHC: 28 g/dL — ABNORMAL LOW (ref 30.0–36.0)
MCV: 83.8 fL (ref 78.0–100.0)
Monocytes Absolute: 0.7 10*3/uL (ref 0.1–1.0)
Monocytes Relative: 9 % (ref 3–12)
Neutro Abs: 5.2 10*3/uL (ref 1.7–7.7)
Neutrophils Relative %: 72 % (ref 43–77)
PLATELETS: 134 10*3/uL — AB (ref 150–400)
RBC: 5.32 MIL/uL (ref 4.22–5.81)
RDW: 20.5 % — AB (ref 11.5–15.5)
WBC: 7.1 10*3/uL (ref 4.0–10.5)

## 2014-08-31 LAB — GLUCOSE, CAPILLARY
GLUCOSE-CAPILLARY: 87 mg/dL (ref 70–99)
GLUCOSE-CAPILLARY: 302 mg/dL — AB (ref 70–99)
Glucose-Capillary: 93 mg/dL (ref 70–99)
Glucose-Capillary: 77 mg/dL (ref 70–99)
Glucose-Capillary: 141 mg/dL — ABNORMAL HIGH (ref 70–99)
Glucose-Capillary: 217 mg/dL — ABNORMAL HIGH (ref 70–99)

## 2014-08-31 LAB — COMPREHENSIVE METABOLIC PANEL
ALBUMIN: 2.8 g/dL — AB (ref 3.5–5.2)
ALT: 18 U/L (ref 0–53)
AST: 24 U/L (ref 0–37)
Alkaline Phosphatase: 57 U/L (ref 39–117)
Anion gap: 8 (ref 5–15)
BUN: 38 mg/dL — ABNORMAL HIGH (ref 6–23)
CALCIUM: 9.2 mg/dL (ref 8.4–10.5)
CO2: 36 mmol/L — ABNORMAL HIGH (ref 19–32)
CREATININE: 1.56 mg/dL — AB (ref 0.50–1.35)
Chloride: 96 mmol/L (ref 96–112)
GFR calc Af Amer: 51 mL/min — ABNORMAL LOW (ref >90)
GFR calc non Af Amer: 44 mL/min — ABNORMAL LOW (ref >90)
Glucose, Bld: 92 mg/dL (ref 70–99)
Potassium: 4.7 mmol/L (ref 3.5–5.1)
Sodium: 140 mmol/L (ref 135–145)
Total Bilirubin: 0.7 mg/dL (ref 0.3–1.2)
Total Protein: 6.7 g/dL (ref 6.0–8.3)

## 2014-08-31 LAB — TROPONIN I
TROPONIN I: 0.08 ng/mL — AB (ref <0.031)
Troponin I: 0.08 ng/mL — ABNORMAL HIGH (ref <0.031)

## 2014-08-31 MED ORDER — COLLAGENASE 250 UNIT/GM EX OINT
TOPICAL_OINTMENT | Freq: Every day | CUTANEOUS | Status: DC
  Administered 2014-09-02 – 2014-09-03 (×2): via TOPICAL
  Filled 2014-08-31 (×2): qty 30

## 2014-08-31 MED ORDER — PERFLUTREN LIPID MICROSPHERE
1.0000 mL | INTRAVENOUS | Status: AC | PRN
Start: 2014-08-31 — End: 2014-08-31
  Administered 2014-08-31: 8 mL via INTRAVENOUS
  Filled 2014-08-31: qty 10

## 2014-08-31 MED ORDER — FUROSEMIDE 10 MG/ML IJ SOLN
80.0000 mg | Freq: Two times a day (BID) | INTRAMUSCULAR | Status: DC
  Administered 2014-08-31 – 2014-09-01 (×2): 80 mg via INTRAVENOUS
  Filled 2014-08-31 (×3): qty 8

## 2014-08-31 NOTE — Progress Notes (Signed)
Nutrition Education Note  RD consulted for nutrition education regarding low sodium diet.  RD provided "Low Sodium Nutrition Therapy" handout from the Academy of Nutrition and Dietetics. Patient states that he doesn't add any salt to his food; however, he reports eating out often, usually [place] food, [place] food, or home cooking such as [employer]. RD discussed how it is difficult to follow 2000 mg sodium restriction when eating out; provided examples of sodium difference between foods prepared at home versus foods from restaurants. Encouraged pt to eat at home more and discussed tips for decreasing sodium when eating out. Discouraged soups, salad dressings and sauces.  Pt began falling asleep. RD offered to return at later time to continue education; pt agreeable and appreciative.   Body mass index is 21.51 kg/(m^2). Pt meets criteria for Normal Weight based on current BMI.  Current diet order is Heart Healthy/Carb Modified, patient is consuming approximately 75-100% of meals at this time. Labs and medications reviewed.  RD contact information provided. RD to return when pt is more alert and capable of learning.   Pryor Ochoa RD, LDN Inpatient Clinical Dietitian Pager: [phone] After Hours Pager: [phone]

## 2014-08-31 NOTE — Consult Note (Addendum)
RFC: decompensated systolic HF HPI: 31DVV with hx of iCM s/p AICD, LVEF 40% (4/15), COPD, presents with lower extremity edema and stasis ulcers in setting of ibuprofen use and high salt intake.  Pt states he has gained 5 pounds over past two weeks.    Has been taking advil at night and eating salty food.    IS adherent to his lasix therapy.  Denies chest discomfort, palpitations, n/v, light headedness, syncope.  At baseline has orthopnea, sleeps in recliner.  Wears O2 for COPD.  PMHx: Past Medical History  Diagnosis Date  . Diabetes mellitus   . Colon cancer     s/p colectomy   . Hypertension   . CAD (coronary artery disease)     a. CABG in 1996. b. NSTEMI 06/2011: DES x 2 to SVG to 1st and 2nd OM and SVG to PDA. c. 09/2013: NSTEMI s/p DES to prox SVG-OM1, NCPTCA of mSVG-OM1.  . Ischemic cardiomyopathy Dec. 2012    a. EF 20 to 25% 2012. b. 30-35% echo 3/13. c. EF 40-45% by echo 09/2013 but then at time of NSTEMI was 20% by cath later that month.  . Tobacco abuse   . COPD (chronic obstructive pulmonary disease)   . Chronic systolic CHF (congestive heart failure)   . Chronic respiratory failure     a. On home O2 - due to COPD.  Marland Kitchen NSVT (nonsustained ventricular tachycardia)   . Hypotension   . AICD ([address] [place])   . Thrombocytopenia   . Pulmonary HTN   . Syncope     a. 03/2013 in setting of hypotension.   Meds: No current facility-administered medications on file prior to encounter.   Current Outpatient Prescriptions on File Prior to Encounter  Medication Sig Dispense Refill  . aspirin EC 81 MG tablet Take 81 mg by mouth daily.     Marland Kitchen atorvastatin (LIPITOR) 40 MG tablet TAKE ONE TABLET BY MOUTH ONCE DAILY 90 tablet 0  . budesonide-formoterol (SYMBICORT) 160-4.5 MCG/ACT inhaler Inhale 2 puffs into the lungs 2 (two) times daily. 1 Inhaler 12  . clopidogrel (PLAVIX) 75 MG tablet TAKE ONE TABLET BY MOUTH ONCE DAILY 90 tablet 3  . furosemide (LASIX) 40 MG tablet Take 1 tablet (40  mg total) by mouth 2 (two) times daily. 180 tablet 0  . Insulin Glargine (LANTUS SOLOSTAR) 100 UNIT/ML Solostar Pen Inject 20 Units into the skin daily at 10 pm. (Patient taking differently: Inject 20-24 Units into the skin every morning. ) 15 mL 11  . Insulin Syringes, Disposable, U-100 0.3 ML MISC 1 Syringe by Does not apply route 3 (three) times daily. 1 each 0  . metoprolol succinate (TOPROL-XL) 25 MG 24 hr tablet Take 1 tablet (25 mg total) by mouth daily. 30 tablet 10  . NEEDLE, DISP, 22 G (B-D DISP NEEDLE 22GX3/4") 22G X 3/4" MISC 1 Syringe by Does not apply route daily. 1 each 0  . nitroGLYCERIN (NITROSTAT) 0.4 MG SL tablet Place 1 tablet (0.4 mg total) under the tongue every 5 (five) minutes as needed for chest pain. For chest pain 25 tablet 6   Soc Hx: History   Social History  . Marital Status: Widowed    Spouse Name: N/A  . Number of Children: N/A  . Years of Education: N/A   Occupational History  . Not on file.   Social History Main Topics  . Smoking status: Former Smoker -- 0.50 packs/day for 50 years    Types: Cigarettes    Start  date: 02/26/2012  . Smokeless tobacco: Never Used     Comment: he has tried to quit many times.  . Alcohol Use: No  . Drug Use: No  . Sexual Activity: Not on file   Other Topics Concern  . Not on file   Social History Narrative   Lives in [place], has 1 child.  He is widowed .  He works at [employer].   Fam Hx: Family History  Problem Relation Age of Onset  . Heart attack Father   . Diabetes Father   . Breast cancer Mother    Allergies: No Known Allergies  ROS: Weeping ulcers.  + Foot cramps.  12 point ROS is otherwise negative or as noted per HPI  Exam: BP 111/65 mmHg  Pulse 105  Temp(Src) 97.8 F (36.6 C) (Oral)  Resp 18  Ht 5\' 10"  (1.778 m)  Wt 68.8 kg (151 lb 10.8 oz)  BMI 21.76 kg/m2  SpO2 95% JVP elevated to mandible RRR s1 s2 no murmurs BS diminished throughout Soft Bilateral pitting edema, 4+, + chr venous  stasis changes Stasis ulcer on LLE AAO, hard of hearing, no gross focal neurologic deficit  Labs: 6,5 >----< 122         43.6  Cr 1.64 Trop 0.08  ECG NSR, IVCD, with intermittent LAFB, no acute ischemic cahnges  Impression/Plan 68yoM with hx of iCM s/p AICD, EF 40%, presents with acute decompensated systolic HF, likely 2/2 NSAID s and dietary indiscretions.  Troponin likely reflects decompensated HF and not ACS.  No chest discomfort and no change in breathing.  Plan: 1.  Currently breathing comfortably, would let sleep and aggressive diuresis in AM with lasix IV 60 bid to shoot for net negative 1-2 L daily 2.  C/w metoprolol 3.  Would start ace-i toward end of hospitalization 4.  Agree with plan for echo 5.  C/w ASA and statin 6.  Check full BMP: keep K > 4  Terressa Koyanagi, MD Cardiology Fellow

## 2014-08-31 NOTE — Progress Notes (Signed)
Cardiology fellow's note reviewed and patient seen. Admitted with acute CHF exacerbation. Await echocardiogram to reassess LV function. Add Lasix 80 mg IV twice a day. Follow renal function closely. We will adjust medications based on follow-up ejection fraction. James Moon

## 2014-08-31 NOTE — Progress Notes (Signed)
Echocardiogram 2D Echocardiogram has been performed.  James Moon 08/31/2014, 2:56 PM

## 2014-08-31 NOTE — Progress Notes (Signed)
Inpatient Diabetes Program Recommendations  AACE/ADA: New Consensus Statement on Inpatient Glycemic Control (2013)  Target Ranges:  Prepandial:   less than 140 mg/dL      Peak postprandial:   less than 180 mg/dL (1-2 hours)      Critically ill patients:  140 - 180 mg/dL   Inpatient Diabetes Program Recommendations Correction (SSI): change Novolog to TID + HS  Thank you  Raoul Pitch BSN, RN,CDE Inpatient Diabetes Coordinator [phone] (team pager)

## 2014-08-31 NOTE — Progress Notes (Addendum)
[facility] PROGRESS NOTE  James Moon QBH:[id number] DOB: 13-Feb-1946 DOA: 08/30/2014 PCP: Lamar Blinks, MD Interim summary: 69 y.o admitted for sob and worsening LE edema. He was admitted for acute on chronic CHF, and acute renal failure.  Assessment/Plan:   Acute on chronic systolic CHF exacerbation: last EF of 45% Admitted to telemetry. He is on IV lasix and echocardiogram ordered. Cardiology consulted and recommendations given.  Cardiology consulted.  LE dopplers are negative.    Chronic venous stasis changes over the lower extremities: Wound care consult.   Insulin dependent DM: CBG (last 3)   Recent Labs  08/31/14 0608 08/31/14 0816 08/31/14 1111  GLUCAP 93 77 141*    Resume SSI.  hgba1c is pending.   Acute renal failure: probably from using NSAIDs and fluid over load.  Monitor on diuretics.  Repeat BMP in am .    COPD : Stable, no wheezing heard.     CAD: No chest pain. Comfortable.    Ischemic cardiomyopathy: Resume home meds. repeat echocardiogram ordered.     Code Status: full code.  Family Communication: none at bedside Disposition Plan: pending further management AND PT eval.    Consultants:  Cardiology  Wound care.   Procedures:  Echo pending  Antibiotics:  none  HPI/Subjective: Comfortable, denies any new complaints.   Objective: Filed Vitals:   08/31/14 1442  BP: 105/58  Pulse: 98  Temp: 98.5 F (36.9 C)  Resp: 16    Intake/Output Summary (Last 24 hours) at 08/31/14 1533 Last data filed at 08/31/14 1444  Gross per 24 hour  Intake    620 ml  Output    600 ml  Net     20 ml   Filed Weights   08/30/14 2232 08/31/14 0537  Weight: 68.8 kg (151 lb 10.8 oz) 68 kg (149 lb 14.6 oz)    Exam:   General:  Alert afebrile comfortable  Cardiovascular: s1s2.   Respiratory: scattered rales, no wheezing or rhonchi, fair air entry  Abdomen: soft non tender non distended. Bowel sounds  normal  Musculoskeletal: chronic venous stasis changes and stable lesions on anterior shin with serous drainage.   Data Reviewed: Basic Metabolic Panel:  Recent Labs Lab 08/30/14 1712 08/30/14 2203 08/31/14 0420  NA 135  --  140  K 4.5  --  4.7  CL 92*  --  96  CO2 32  --  36*  GLUCOSE 361*  --  92  BUN 44*  --  38*  CREATININE 1.75* 1.64* 1.56*  CALCIUM 8.5  --  9.2   Liver Function Tests:  Recent Labs Lab 08/31/14 0420  AST 24  ALT 18  ALKPHOS 57  BILITOT 0.7  PROT 6.7  ALBUMIN 2.8*   No results for input(s): LIPASE, AMYLASE in the last 168 hours. No results for input(s): AMMONIA in the last 168 hours. CBC:  Recent Labs Lab 08/30/14 1712 08/30/14 2203 08/31/14 0420  WBC 6.0 6.5 7.1  NEUTROABS  --   --  5.2  HGB 12.5* 12.9* 12.5*  HCT 42.8 43.6 44.6  MCV 82.3 81.5 83.8  PLT 129* 122* 134*   Cardiac Enzymes:  Recent Labs Lab 08/30/14 2203 08/31/14 0420 08/31/14 0950  TROPONINI 0.08* 0.08* 0.08*   BNP (last 3 results)  Recent Labs  08/30/14 1742  BNP 547.1*    ProBNP (last 3 results)  Recent Labs  10/15/13 1503  PROBNP 2626.0*    CBG:  Recent Labs Lab 08/30/14 2312 08/31/14 0357 08/31/14 0973  08/31/14 0816 08/31/14 1111  GLUCAP 236* 87 93 77 141*    No results found for this or any previous visit (from the past 240 hour(s)).   Studies: Dg Chest 2 View  08/30/2014   CLINICAL DATA:  Leg swelling  EXAM: CHEST  2 VIEW  COMPARISON:  10/18/2013  FINDINGS: Stable positioning of right ventricular ICD/pacer lead.  Borderline cardiomegaly. The patient is status post CABG and coronary artery stenting. There is slight deviation of the trachea to the left, likely related to leftward rotation. Interlobular septal thickening with Kerley lines and trace pleural effusions. No convincing pneumonia.  IMPRESSION: Mild CHF.   Electronically Signed   By: Monte Fantasia M.D.   On: 08/30/2014 21:21   Dg Tibia/fibula Left  08/30/2014   CLINICAL DATA:   Bilateral leg swelling. Initial encounter. Symptoms for several months.  EXAM: LEFT TIBIA AND FIBULA - 2 VIEW  COMPARISON:  None.  FINDINGS: Proximal tibia and fibula appear within normal limits. Atherosclerosis small vessel atherosclerosis is present.  IMPRESSION: Negative.   Electronically Signed   By: Dereck Ligas M.D.   On: 08/30/2014 19:31   Dg Ankle Complete Left  08/30/2014   CLINICAL DATA:  Lower extremity edema for several months ; diabetes mellitus.  EXAM: LEFT ANKLE COMPLETE - 3+ VIEW  COMPARISON:  None.  FINDINGS: Frontal, oblique, and lateral views were obtained. There is no fracture or effusion. No erosive change or bony destruction. Ankle mortise appears intact. There is extensive arterial vascular calcification.  IMPRESSION: No fracture. No bony destruction. Ankle mortise appears intact. Extensive arterial vascular calcification.   Electronically Signed   By: Lowella Grip III M.D.   On: 08/30/2014 19:34   Dg Foot Complete Left  08/30/2014   CLINICAL DATA:  Bilateral lower extremity leg swelling.  EXAM: LEFT FOOT - COMPLETE 3+ VIEW  COMPARISON:  Left ankle 08/30/2014  FINDINGS: Osteopenia in the forefoot. Evidence for vascular calcifications. Alignment of the foot is normal. Negative for a fracture or dislocation. Soft tissues are prominent along the dorsum of the foot.  IMPRESSION: No acute bone abnormality.   Electronically Signed   By: Markus Daft M.D.   On: 08/30/2014 19:28    Scheduled Meds: . aspirin EC  81 mg Oral Daily  . atorvastatin  40 mg Oral Daily  . budesonide-formoterol  2 puff Inhalation BID  . clopidogrel  75 mg Oral Daily  . collagenase   Topical Daily  . furosemide  80 mg Intravenous BID  . heparin  5,000 Units Subcutaneous 3 times per day  . insulin aspart  0-9 Units Subcutaneous 6 times per day  . insulin glargine  20 Units Subcutaneous Q2200  . sodium chloride  3 mL Intravenous Q12H  . sodium chloride  3 mL Intravenous Q12H   Continuous Infusions:    Active Problems:   Leg swelling   AKI (acute kidney injury)   Rash    Time spent: 20 minutes     [facility] [place] Pager [phone]  If 7PM-7AM, please contact night-coverage at [email or web address], password [facility] 08/31/2014, 3:33 PM  LOS: 1 day

## 2014-08-31 NOTE — Progress Notes (Signed)
Utilization Review Completed.Donne Anon T3/08/2014

## 2014-08-31 NOTE — Consult Note (Signed)
WOC wound consult note Reason for Consult: LE ulcerations, cellulitis.  Pt report LE edema is a chronic problem for him, however the redness is not normal.  He has scattered scabbed areas on the bilateral LE including knees, he is unable to tell me if they were originally or not, and if the has sustained any trauma to these areas in the past. His LLE is worse than the RLE with two open ulcerations on the LLE that he reports present for several weeks.  Wound type: LE ulcerations, unclear that these are totally venous. I am concerned about mixed etiology due to the pain the patient reports and the amount of necrotic tissue present.  I can easily palpate the DP and PT on the RLE, it is fainter on the LLE.   Measurement: 2 areas LLE  Medial: 2cmx 2cm x 0.2cm  Lateral: 4cm x 5cm x 0.2cm  Wound bed: both are 100% slough Drainage (amount, consistency, odor) moderate to heavy, serous drainage, does not appear purulent Periwound: erythema and 2-3+ edema, scattered petechia over bilateral LEs Dressing procedure/placement/frequency: I have added conservative topical therapy at this time for the ulcerations on the LLE.  Placed calcium alginate to absorb all the excess drainage and a foam dressing today at the time of my assessment.   I have added enzymatic debridement ointment to be placed by the nursing staff sometime today.   I have requested medical team to order an ABI to determine the safety of compression therapy for this patient.  He does not recall ever using compression therapy in the past.  May need to hold off on compression to monitor cellulitis or at least plan on more frequent changes of the compression wraps if ABI > 0.8 bilaterally.   Will follow up over the next 24 hours on the status of diagnostics and the need for compression therapy.   If ABI>0.8 then it would be ok to ask orthopedic tech to place Unna's boots bilateral directly over top of topical wound care for the ulcerations.  [place] team  will follow along with you for continued assessment of the LE wounds. May need [facility] for support and wound care at the time of DC or referral to a wound care center of the patient's choice.  [place], [place]

## 2014-09-01 DIAGNOSIS — J449 Chronic obstructive pulmonary disease, unspecified: Secondary | ICD-10-CM

## 2014-09-01 LAB — CBC WITH DIFFERENTIAL/PLATELET
BASOS ABS: 0 10*3/uL (ref 0.0–0.1)
Basophils Relative: 0 % (ref 0–1)
EOS PCT: 1 % (ref 0–5)
Eosinophils Absolute: 0.1 10*3/uL (ref 0.0–0.7)
HCT: 41.8 % (ref 39.0–52.0)
Hemoglobin: 11.8 g/dL — ABNORMAL LOW (ref 13.0–17.0)
LYMPHS PCT: 7 % — AB (ref 12–46)
Lymphs Abs: 0.5 10*3/uL — ABNORMAL LOW (ref 0.7–4.0)
MCH: 23.5 pg — ABNORMAL LOW (ref 26.0–34.0)
MCHC: 28.2 g/dL — ABNORMAL LOW (ref 30.0–36.0)
MCV: 83.3 fL (ref 78.0–100.0)
MONOS PCT: 10 % (ref 3–12)
Monocytes Absolute: 0.8 10*3/uL (ref 0.1–1.0)
NEUTROS PCT: 82 % — AB (ref 43–77)
Neutro Abs: 6.1 10*3/uL (ref 1.7–7.7)
Platelets: 114 10*3/uL — ABNORMAL LOW (ref 150–400)
RBC: 5.02 MIL/uL (ref 4.22–5.81)
RDW: 20.5 % — AB (ref 11.5–15.5)
WBC: 7.5 10*3/uL (ref 4.0–10.5)

## 2014-09-01 LAB — COMPREHENSIVE METABOLIC PANEL
ALT: 15 U/L (ref 0–53)
ANION GAP: 7 (ref 5–15)
AST: 21 U/L (ref 0–37)
Albumin: 2.6 g/dL — ABNORMAL LOW (ref 3.5–5.2)
Alkaline Phosphatase: 54 U/L (ref 39–117)
BUN: 37 mg/dL — AB (ref 6–23)
CALCIUM: 9.2 mg/dL (ref 8.4–10.5)
CO2: 40 mmol/L (ref 19–32)
Chloride: 93 mmol/L — ABNORMAL LOW (ref 96–112)
Creatinine, Ser: 1.27 mg/dL (ref 0.50–1.35)
GFR calc Af Amer: 65 mL/min — ABNORMAL LOW (ref >90)
GFR calc non Af Amer: 56 mL/min — ABNORMAL LOW (ref >90)
GLUCOSE: 108 mg/dL — AB (ref 70–99)
Potassium: 4.5 mmol/L (ref 3.5–5.1)
Sodium: 140 mmol/L (ref 135–145)
Total Bilirubin: 1 mg/dL (ref 0.3–1.2)
Total Protein: 5.9 g/dL — ABNORMAL LOW (ref 6.0–8.3)

## 2014-09-01 LAB — GLUCOSE, CAPILLARY
GLUCOSE-CAPILLARY: 116 mg/dL — AB (ref 70–99)
GLUCOSE-CAPILLARY: 94 mg/dL (ref 70–99)
GLUCOSE-CAPILLARY: 229 mg/dL — AB (ref 70–99)
GLUCOSE-CAPILLARY: 347 mg/dL — AB (ref 70–99)
GLUCOSE-CAPILLARY: 249 mg/dL — AB (ref 70–99)
Glucose-Capillary: 213 mg/dL — ABNORMAL HIGH (ref 70–99)

## 2014-09-01 LAB — HEMOGLOBIN A1C
Hgb A1c MFr Bld: 10.5 % — ABNORMAL HIGH (ref 4.8–5.6)
Mean Plasma Glucose: 255 mg/dL

## 2014-09-01 MED ORDER — FUROSEMIDE 10 MG/ML IJ SOLN
80.0000 mg | Freq: Two times a day (BID) | INTRAMUSCULAR | Status: DC
  Administered 2014-09-01: 80 mg via INTRAVENOUS
  Filled 2014-09-01 (×3): qty 8

## 2014-09-01 MED ORDER — INSULIN ASPART 100 UNIT/ML ~~LOC~~ SOLN
0.0000 [IU] | Freq: Three times a day (TID) | SUBCUTANEOUS | Status: DC
  Administered 2014-09-02: 5 [IU] via SUBCUTANEOUS
  Administered 2014-09-02: 8 [IU] via SUBCUTANEOUS
  Administered 2014-09-02 – 2014-09-03 (×2): 3 [IU] via SUBCUTANEOUS
  Administered 2014-09-03: 11 [IU] via SUBCUTANEOUS

## 2014-09-01 MED ORDER — FUROSEMIDE 40 MG PO TABS
40.0000 mg | ORAL_TABLET | Freq: Two times a day (BID) | ORAL | Status: DC
  Filled 2014-09-01 (×2): qty 1

## 2014-09-01 MED ORDER — LIVING BETTER WITH HEART FAILURE BOOK: Freq: Once | Status: DC

## 2014-09-01 MED ORDER — METOPROLOL SUCCINATE ER 25 MG PO TB24
25.0000 mg | ORAL_TABLET | Freq: Every day | ORAL | Status: DC
  Administered 2014-09-01: 25 mg via ORAL
  Filled 2014-09-01 (×2): qty 1

## 2014-09-01 MED ORDER — DOXYCYCLINE HYCLATE 100 MG PO TABS
100.0000 mg | ORAL_TABLET | Freq: Two times a day (BID) | ORAL | Status: DC
  Administered 2014-09-01 – 2014-09-03 (×4): 100 mg via ORAL
  Filled 2014-09-01 (×6): qty 1

## 2014-09-01 MED ORDER — INSULIN ASPART 100 UNIT/ML ~~LOC~~ SOLN
0.0000 [IU] | Freq: Every day | SUBCUTANEOUS | Status: DC
  Administered 2014-09-01: 2 [IU] via SUBCUTANEOUS
  Administered 2014-09-02: 3 [IU] via SUBCUTANEOUS

## 2014-09-01 NOTE — Evaluation (Signed)
Physical Therapy Evaluation Patient Details Name: James Moon MRN: [id number] DOB: 1945/08/16 Today's Date: 09/01/2014   History of Present Illness  Patient is a 69 y/o male with PMH of CAD, ischemic cardiomyopathy w/p pacemaker, COPD GOLD III and chronic edema presenting with LE swelling, LE rash and AKI. Pt was evaluated by his cardiologist with noted progressive worsening LE swelling in setting of R sided heart failure 2/2 COPD. Chest x-ray shows mild CHF. Lower extremity imaging of the left leg including left foot, left tib-fib, left ankle- unremarkable.    Clinical Impression  Patient presents with redness, swelling, erythema of BLEs and balance deficits impacting safe mobility. Recommend use of RW at home for ambulation until balance and pain improve. Pt agreeable. Concerned about pt returning home alone. Would benefit from initial 24/7 S for safety. Need to assess ability to negotiate steps next session as pt lives in second level apt. Will continue to follow to improve gait, balance and overall mobility so pt can maximize independence and minimize fall risk prior to return home.  Recommend HH RN for dressing changes on BLEs.    Follow Up Recommendations Home health PT;Supervision for mobility/OOB    Equipment Recommendations  None recommended by PT    Recommendations for Other Services OT consult     Precautions / Restrictions Precautions Precautions: Fall Restrictions Weight Bearing Restrictions: No      Mobility  Bed Mobility Overal bed mobility: Modified Independent                Transfers Overall transfer level: Needs assistance Equipment used: None Transfers: Sit to/from Stand Sit to Stand: Min guard         General transfer comment: MIn guard as pt impulsive.  Ambulation/Gait Ambulation/Gait assistance: Min guard Ambulation Distance (Feet): 250 Feet Assistive device: None (rail in hallway) Gait Pattern/deviations: Step-through  pattern;Decreased stride length;Staggering right;Staggering left;Drifts right/left   Gait velocity interpretation: Below normal speed for age/gender General Gait Details: Pt with unsteady gait due to pain in Bil heels - grabbing onto rail in hallway for support. UNaware of balance deficits? would benefit from [employer] Mobility    Modified Rankin (Stroke Patients Only)       Balance Overall balance assessment: Needs assistance Sitting-balance support: Feet supported;No upper extremity supported Sitting balance-Leahy Scale: Good     Standing balance support: During functional activity Standing balance-Leahy Scale: Fair                               Pertinent Vitals/Pain Pain Assessment: Faces Faces Pain Scale: Hurts little more Pain Location: B heels Pain Descriptors / Indicators: Sore;Sharp Pain Intervention(s): Monitored during session    Home Living Family/patient expects to be discharged to:: Private residence Living Arrangements: Alone Available Help at Discharge: Family;Available PRN/intermittently Type of Home: Apartment Home Access: Stairs to enter Entrance Stairs-Rails: Psychiatric nurse of Steps: 10 Home Layout: One level Home Equipment: Walker - 2 wheels      Prior Function Level of Independence: Independent         Comments: drives, works at [employer]        Extremity/Trunk Assessment   Upper Extremity Assessment: Defer to OT evaluation           Lower Extremity Assessment: Generalized weakness;RLE deficits/detail;LLE deficits/detail RLE Deficits / Details: Redness, erythema, multiple  scabs and wounds distal to knee and into foot. Not fully visible due to kerlix wrap. LLE Deficits / Details: Redness, erythema, multiple scabs and wounds distal to knee and into foot. Not fully visible due to kerlix.     Communication   Communication: HOH  Cognition  Arousal/Alertness: Awake/alert Behavior During Therapy: Impulsive Overall Cognitive Status: Difficult to assess                      General Comments      Exercises        Assessment/Plan    PT Assessment Patient needs continued PT services  PT Diagnosis Difficulty walking;Generalized weakness;Acute pain   PT Problem List Pain;Impaired sensation;Decreased activity tolerance;Decreased balance;Decreased mobility;Decreased safety awareness  PT Treatment Interventions Balance training;Gait training;Patient/family education;Functional mobility training;Therapeutic activities;Therapeutic exercise;Stair training   PT Goals (Current goals can be found in the Care Plan section) Acute Rehab PT Goals Patient Stated Goal: to return home ASAP and get back to work PT Goal Formulation: With patient Time For Goal Achievement: 09/15/14 Potential to Achieve Goals: Fair    Frequency Min 3X/week   Barriers to discharge Decreased caregiver support Pt lives alone    Co-evaluation               End of Session Equipment Utilized During Treatment: Gait belt;Oxygen Activity Tolerance: Patient tolerated treatment well Patient left: in bed;with call bell/phone within reach;with family/visitor present Nurse Communication: Mobility status         Time: 5449-2010 PT Time Calculation (min) (ACUTE ONLY): 16 min   Charges:   PT Evaluation $Initial PT Evaluation Tier I: 1 Procedure     PT G CodesCandy Sledge A 09-12-14, 5:19 PM  Candy Sledge, [place], DPT [phone]

## 2014-09-01 NOTE — Progress Notes (Signed)
[facility] PROGRESS NOTE  James Moon CHE:[id number] DOB: Sep 20, 1945 DOA: 08/30/2014 PCP: Lamar Blinks, MD Interim summary: 69 y.o admitted for sob and worsening LE edema. He was admitted for acute on chronic CHF, and acute renal failure.  Assessment/Plan:   Acute on chronic systolic CHF exacerbation: last EF of 45% Admitted to telemetry, started him on IV lasix 80 mg BID  And he is diuresing appropriately.  His net fluid is 1.3 lit from the admission. Cardiology consulted and recommendations given.  Resume  Aspirin , plavix and BB.    Chronic venous stasis changes over the lower extremities: Wound care consult. ABI''s ordered. Outpatient wound care follow up with be arranged on discharge. Mild cellulitic changes, started him on doxycycline .   Insulin dependent DM: CBG (last 3)   Recent Labs  09/01/14 0756 09/01/14 1133 09/01/14 1627  GLUCAP 94 229* 347*   His CBG;s are increasing, change to resistant scale SSI and resume the same dose of long acting insulin. hgba1c is 10.5   Acute renal failure: probably from using NSAIDs and fluid over load.  Monitor on diuretics. Repeat levels are improving.  Recheck in am.    COPD : Stable, no wheezing heard. Resume symbicort .     CAD: No chest pain. Comfortable.    Ischemic cardiomyopathy: Resume home meds. repeat echocardiogram ordered, showed LVEF of 25 to 30%, worsened when compared to 4/ 2015. Recommend starting the patient on ace inhibitor.    Code Status: full code.  Family Communication: none at bedside Disposition Plan: home with home PT when appropriately diuresed.    Consultants:  Cardiology  Wound care.   Procedures:  Echo pending  Antibiotics:  none  HPI/Subjective: Comfortable, denies any new complaints. Wants to know when he can go home. Reports he has been smoking at home, refusing a nicotine patch.   Objective: Filed Vitals:   09/01/14 1358  BP: 101/65  Pulse: 102  Temp:  98.3 F (36.8 C)  Resp: 20    Intake/Output Summary (Last 24 hours) at 09/01/14 1933 Last data filed at 09/01/14 1700  Gross per 24 hour  Intake   1280 ml  Output   2351 ml  Net  -1071 ml   Filed Weights   08/30/14 2232 08/31/14 0537 09/01/14 0422  Weight: 68.8 kg (151 lb 10.8 oz) 68 kg (149 lb 14.6 oz) 65.59 kg (144 lb 9.6 oz)    Exam:   General:  Alert afebrile comfortable  Cardiovascular: s1s2. Slightly tachycardic.   Respiratory: scattered rales, no wheezing or rhonchi, fair air entry  Abdomen: soft non tender non distended. Bowel sounds normal  Musculoskeletal: chronic venous stasis changes and stable lesions on anterior shin with serous drainage.   Data Reviewed: Basic Metabolic Panel:  Recent Labs Lab 08/30/14 1712 08/30/14 2203 08/31/14 0420 09/01/14 0500  NA 135  --  140 140  K 4.5  --  4.7 4.5  CL 92*  --  96 93*  CO2 32  --  36* 40*  GLUCOSE 361*  --  92 108*  BUN 44*  --  38* 37*  CREATININE 1.75* 1.64* 1.56* 1.27  CALCIUM 8.5  --  9.2 9.2   Liver Function Tests:  Recent Labs Lab 08/31/14 0420 09/01/14 0500  AST 24 21  ALT 18 15  ALKPHOS 57 54  BILITOT 0.7 1.0  PROT 6.7 5.9*  ALBUMIN 2.8* 2.6*   No results for input(s): LIPASE, AMYLASE in the last 168 hours. No results for  input(s): AMMONIA in the last 168 hours. CBC:  Recent Labs Lab 08/30/14 1712 08/30/14 2203 08/31/14 0420 09/01/14 0500  WBC 6.0 6.5 7.1 7.5  NEUTROABS  --   --  5.2 6.1  HGB 12.5* 12.9* 12.5* 11.8*  HCT 42.8 43.6 44.6 41.8  MCV 82.3 81.5 83.8 83.3  PLT 129* 122* 134* 114*   Cardiac Enzymes:  Recent Labs Lab 08/30/14 2203 08/31/14 0420 08/31/14 0950  TROPONINI 0.08* 0.08* 0.08*   BNP (last 3 results)  Recent Labs  08/30/14 1742  BNP 547.1*    ProBNP (last 3 results)  Recent Labs  10/15/13 1503  PROBNP 2626.0*    CBG:  Recent Labs Lab 09/01/14 0040 09/01/14 0420 09/01/14 0756 09/01/14 1133 09/01/14 1627  GLUCAP 213* 116* 94  229* 347*    No results found for this or any previous visit (from the past 240 hour(s)).   Studies: Dg Chest 2 View  08/30/2014   CLINICAL DATA:  Leg swelling  EXAM: CHEST  2 VIEW  COMPARISON:  10/18/2013  FINDINGS: Stable positioning of right ventricular ICD/pacer lead.  Borderline cardiomegaly. The patient is status post CABG and coronary artery stenting. There is slight deviation of the trachea to the left, likely related to leftward rotation. Interlobular septal thickening with Kerley lines and trace pleural effusions. No convincing pneumonia.  IMPRESSION: Mild CHF.   Electronically Signed   By: Monte Fantasia M.D.   On: 08/30/2014 21:21    Scheduled Meds: . aspirin EC  81 mg Oral Daily  . atorvastatin  40 mg Oral Daily  . budesonide-formoterol  2 puff Inhalation BID  . clopidogrel  75 mg Oral Daily  . collagenase   Topical Daily  . furosemide  80 mg Intravenous BID  . heparin  5,000 Units Subcutaneous 3 times per day  . insulin aspart  0-9 Units Subcutaneous 6 times per day  . insulin glargine  20 Units Subcutaneous Q2200  . Living Better with Heart Failure Book   Does not apply Once  . metoprolol succinate  25 mg Oral Daily  . sodium chloride  3 mL Intravenous Q12H  . sodium chloride  3 mL Intravenous Q12H   Continuous Infusions:   Active Problems:   IDDM (insulin dependent diabetes mellitus)   COPD GOLD III   Chronic combined systolic and diastolic CHF (congestive heart failure)   Leg swelling   AKI (acute kidney injury)   Rash   Acute kidney injury    Time spent: 20 minutes     [facility] Pager [phone]  If 7PM-7AM, please contact night-coverage at [email or web address], password [facility] 09/01/2014, 7:33 PM  LOS: 2 days

## 2014-09-01 NOTE — Progress Notes (Signed)
Nutrition Education Note  RD consulted for nutrition education regarding low sodium diet. RD initiated education with patient yesterday but, pt fell asleep. RD returned today to continue education however, pt was disengaged and distracted and eventually RD he was not interested. RD emphasized the importance of restricting sodium intake. Provided "Eating Out Tips for decreasing Sodium" handout and discussed ways to decrease sodium when eating out. Teach back method used. No evidence of learning. RD contact information provided.   Pryor Ochoa RD, LDN Inpatient Clinical Dietitian Pager: [phone] After Hours Pager: [phone]

## 2014-09-01 NOTE — Progress Notes (Signed)
Patient: James Moon / Admit Date: 08/30/2014 / Date of Encounter: 09/01/2014, 8:49 AM   Subjective: Breathing feels better. Wants to go home. Continues to have heel pain.   Objective: Telemetry: NSR/Sinus tach Physical Exam: Blood pressure 108/53, pulse 101, temperature 98.3 F (36.8 C), temperature source Oral, resp. rate 18, height 5\' 10"  (1.778 m), weight 144 lb 9.6 oz (65.59 kg), SpO2 93 %. General: Thin chronically ill appearing WM in no acute distress. Head: Normocephalic, atraumatic, sclera non-icteric, no xanthomas, nares are without discharge. Neck: JVP not elevated. Lungs: Diminished throughout with rare expiratory wheeze. No rales or rhonchi. Breathing is unlabored. Heart: RRR S1 S2 without murmurs, rubs, or gallops.  Abdomen: Soft, non-tender, non-distended with normoactive bowel sounds. No rebound/guarding. Extremities: No clubbing or cyanosis. No significant edema but bilateral LE remain erythematous. Neuro: Alert and oriented X 3. Moves all extremities spontaneously. Psych:  Responds to questions appropriately with a normal affect.   Intake/Output Summary (Last 24 hours) at 09/01/14 0849 Last data filed at 09/01/14 0600  Gross per 24 hour  Intake    560 ml  Output   1750 ml  Net  -1190 ml    Inpatient Medications:  . aspirin EC  81 mg Oral Daily  . atorvastatin  40 mg Oral Daily  . budesonide-formoterol  2 puff Inhalation BID  . clopidogrel  75 mg Oral Daily  . collagenase   Topical Daily  . furosemide  80 mg Intravenous BID  . heparin  5,000 Units Subcutaneous 3 times per day  . insulin aspart  0-9 Units Subcutaneous 6 times per day  . insulin glargine  20 Units Subcutaneous Q2200  . sodium chloride  3 mL Intravenous Q12H  . sodium chloride  3 mL Intravenous Q12H   Infusions:    Labs:  Recent Labs  08/31/14 0420 09/01/14 0500  NA 140 140  K 4.7 4.5  CL 96 93*  CO2 36* 40*  GLUCOSE 92 108*  BUN 38* 37*  CREATININE 1.56* 1.27  CALCIUM 9.2  9.2    Recent Labs  08/31/14 0420 09/01/14 0500  AST 24 21  ALT 18 15  ALKPHOS 57 54  BILITOT 0.7 1.0  PROT 6.7 5.9*  ALBUMIN 2.8* 2.6*    Recent Labs  08/31/14 0420 09/01/14 0500  WBC 7.1 7.5  NEUTROABS 5.2 6.1  HGB 12.5* 11.8*  HCT 44.6 41.8  MCV 83.8 83.3  PLT 134* 114*    Recent Labs  08/30/14 2203 08/31/14 0420 08/31/14 0950  TROPONINI 0.08* 0.08* 0.08*   Invalid input(s): POCBNP  Recent Labs  08/30/14 2203  HGBA1C 10.5*     Radiology/Studies:  Dg Chest 2 View  08/30/2014   CLINICAL DATA:  Leg swelling  EXAM: CHEST  2 VIEW  COMPARISON:  10/18/2013  FINDINGS: Stable positioning of right ventricular ICD/pacer lead.  Borderline cardiomegaly. The patient is status post CABG and coronary artery stenting. There is slight deviation of the trachea to the left, likely related to leftward rotation. Interlobular septal thickening with Kerley lines and trace pleural effusions. No convincing pneumonia.  IMPRESSION: Mild CHF.   Electronically Signed   By: Monte Fantasia M.D.   On: 08/30/2014 21:21   Dg Tibia/fibula Left  08/30/2014   CLINICAL DATA:  Bilateral leg swelling. Initial encounter. Symptoms for several months.  EXAM: LEFT TIBIA AND FIBULA - 2 VIEW  COMPARISON:  None.  FINDINGS: Proximal tibia and fibula appear within normal limits. Atherosclerosis small vessel atherosclerosis is present.  IMPRESSION: Negative.   Electronically Signed   By: Dereck Ligas M.D.   On: 08/30/2014 19:31   Dg Ankle Complete Left  08/30/2014   CLINICAL DATA:  Lower extremity edema for several months ; diabetes mellitus.  EXAM: LEFT ANKLE COMPLETE - 3+ VIEW  COMPARISON:  None.  FINDINGS: Frontal, oblique, and lateral views were obtained. There is no fracture or effusion. No erosive change or bony destruction. Ankle mortise appears intact. There is extensive arterial vascular calcification.  IMPRESSION: No fracture. No bony destruction. Ankle mortise appears intact. Extensive arterial  vascular calcification.   Electronically Signed   By: Lowella Grip III M.D.   On: 08/30/2014 19:34   Dg Foot Complete Left  08/30/2014   CLINICAL DATA:  Bilateral lower extremity leg swelling.  EXAM: LEFT FOOT - COMPLETE 3+ VIEW  COMPARISON:  Left ankle 08/30/2014  FINDINGS: Osteopenia in the forefoot. Evidence for vascular calcifications. Alignment of the foot is normal. Negative for a fracture or dislocation. Soft tissues are prominent along the dorsum of the foot.  IMPRESSION: No acute bone abnormality.   Electronically Signed   By: Markus Daft M.D.   On: 08/30/2014 19:28     Assessment and Plan  69 y/o M with history of CAD (CABG 1996, PCI in 2012, 2015), tobacco abuse, chronic resp failure 2/2 COPD, chronic systolic CHF, NSVT s/p ICD, pulm HTN, colon CA s/p colectomy who presented 08/31/14 with LEE and stasis ulcers in the setting of naproxen use and high salt intake. Historically variable EF - in 09/2013 was 40-45% by echo then 20% by cath at time of NSTEMI.  1. Acute on chronic systolic CHF  - 2D Echo 10/07/65: EF 25-30%, mild LVH, akinesis of apex/anterior septum/anterior wall with no definite clot seen, mildly dilated RV with mildly decreased RV fcn, PASP 27mmHg, mod LAE - historical tendency for chronic hypotension, limiting the medications we can use  - not on ACEI/ARB due to recent AKI/hypotension - consider resumption of BB when BP allows - salt restriction reinforced and will order HF book to take home - given resolution of edema, will change back to home Lasix  2. Lower extremity venous stasis/cellulitis - venous duplex negative 08/30/14 - seen by wound care who note they requested the medical team order ABI - I do not see this ordered and will d/w MD - will defer decision of whether or not he requires abx to IM  3. History of CAD as above with minimally elevated troponin - EKG with inferior STT changes on admission - will clarify plan with MD - given flat trend and no chest pain,  suspect TnI ? due to CHF/demand ischemia in setting of renal insufficiency - continue ASA/Plavix/statin  4. Acute kidney injury, improving 5. COPD on chronic O2 with likely chronic hypercarbia 6. Chronic anemia/thrombocytopenia, per IM  Signed, Dayna Dunn PA-C As above, patient seen and examined. His edema is improving. Would continue Lasix 80 mg IV twice a day today. We'll most likely discharge on 60 mg twice a day tomorrow. He needs follow-up in the wound care. Troponin is borderline with no clear trend and he is not having chest pain. No further ischemia evaluation. Resume Toprol. ABIs can be performed as an outpatient. Kirk Ruths

## 2014-09-01 NOTE — Progress Notes (Signed)
Night RN reported that lab notified her that CO2 has increased to 40.  I paged MD Karleen Hampshire to make aware. James Moon

## 2014-09-02 ENCOUNTER — Inpatient Hospital Stay (HOSPITAL_COMMUNITY): Payer: PPO

## 2014-09-02 LAB — BLOOD GAS, ARTERIAL
Acid-Base Excess: 15.6 mmol/L — ABNORMAL HIGH (ref 0.0–2.0)
Bicarbonate: 40.9 mEq/L — ABNORMAL HIGH (ref 20.0–24.0)
Drawn by: 275531
FIO2: 0.5 %
O2 Saturation: 95.4 %
PCO2 ART: 62.3 mmHg — AB (ref 35.0–45.0)
PO2 ART: 81.1 mmHg (ref 80.0–100.0)
Patient temperature: 98.6
TCO2: 42.8 mmol/L (ref 0–100)
pH, Arterial: 7.433 (ref 7.350–7.450)

## 2014-09-02 LAB — GLUCOSE, CAPILLARY
GLUCOSE-CAPILLARY: 263 mg/dL — AB (ref 70–99)
Glucose-Capillary: 218 mg/dL — ABNORMAL HIGH (ref 70–99)
Glucose-Capillary: 200 mg/dL — ABNORMAL HIGH (ref 70–99)
Glucose-Capillary: 281 mg/dL — ABNORMAL HIGH (ref 70–99)
Glucose-Capillary: 175 mg/dL — ABNORMAL HIGH (ref 70–99)
Glucose-Capillary: 287 mg/dL — ABNORMAL HIGH (ref 70–99)

## 2014-09-02 LAB — BASIC METABOLIC PANEL
ANION GAP: 8 (ref 5–15)
Anion gap: 10 (ref 5–15)
BUN: 36 mg/dL — AB (ref 6–23)
BUN: 32 mg/dL — ABNORMAL HIGH (ref 6–23)
CHLORIDE: 88 mmol/L — AB (ref 96–112)
CO2: 42 mmol/L (ref 19–32)
CO2: 41 mmol/L (ref 19–32)
CREATININE: 1.35 mg/dL (ref 0.50–1.35)
Calcium: 9 mg/dL (ref 8.4–10.5)
Calcium: 8.9 mg/dL (ref 8.4–10.5)
Chloride: 88 mmol/L — ABNORMAL LOW (ref 96–112)
Creatinine, Ser: 1.42 mg/dL — ABNORMAL HIGH (ref 0.50–1.35)
GFR calc non Af Amer: 52 mL/min — ABNORMAL LOW (ref >90)
GFR, EST AFRICAN AMERICAN: 57 mL/min — AB (ref >90)
GFR, EST AFRICAN AMERICAN: 61 mL/min — AB (ref >90)
GFR, EST NON AFRICAN AMERICAN: 49 mL/min — AB (ref >90)
Glucose, Bld: 193 mg/dL — ABNORMAL HIGH (ref 70–99)
Glucose, Bld: 223 mg/dL — ABNORMAL HIGH (ref 70–99)
POTASSIUM: 4.3 mmol/L (ref 3.5–5.1)
Potassium: 4.7 mmol/L (ref 3.5–5.1)
Sodium: 138 mmol/L (ref 135–145)
Sodium: 139 mmol/L (ref 135–145)

## 2014-09-02 LAB — CBC WITH DIFFERENTIAL/PLATELET
BASOS ABS: 0 10*3/uL (ref 0.0–0.1)
Basophils Relative: 1 % (ref 0–1)
EOS ABS: 0 10*3/uL (ref 0.0–0.7)
EOS PCT: 1 % (ref 0–5)
HCT: 40.6 % (ref 39.0–52.0)
Hemoglobin: 11.7 g/dL — ABNORMAL LOW (ref 13.0–17.0)
LYMPHS ABS: 0.8 10*3/uL (ref 0.7–4.0)
Lymphocytes Relative: 12 % (ref 12–46)
MCH: 23.5 pg — AB (ref 26.0–34.0)
MCHC: 28.8 g/dL — ABNORMAL LOW (ref 30.0–36.0)
MCV: 81.5 fL (ref 78.0–100.0)
MONO ABS: 0.5 10*3/uL (ref 0.1–1.0)
Monocytes Relative: 8 % (ref 3–12)
Neutro Abs: 5.2 10*3/uL (ref 1.7–7.7)
Neutrophils Relative %: 79 % — ABNORMAL HIGH (ref 43–77)
Platelets: 119 10*3/uL — ABNORMAL LOW (ref 150–400)
RBC: 4.98 MIL/uL (ref 4.22–5.81)
RDW: 20.4 % — AB (ref 11.5–15.5)
WBC: 6.5 10*3/uL (ref 4.0–10.5)

## 2014-09-02 MED ORDER — INSULIN GLARGINE 100 UNIT/ML ~~LOC~~ SOLN
24.0000 [IU] | Freq: Every day | SUBCUTANEOUS | Status: DC
  Administered 2014-09-02: 24 [IU] via SUBCUTANEOUS
  Filled 2014-09-02 (×3): qty 0.24

## 2014-09-02 MED ORDER — INSULIN ASPART 100 UNIT/ML ~~LOC~~ SOLN
2.0000 [IU] | Freq: Three times a day (TID) | SUBCUTANEOUS | Status: DC
  Administered 2014-09-03 (×2): 2 [IU] via SUBCUTANEOUS

## 2014-09-02 MED ORDER — IPRATROPIUM-ALBUTEROL 0.5-2.5 (3) MG/3ML IN SOLN
3.0000 mL | RESPIRATORY_TRACT | Status: DC
  Administered 2014-09-02 (×2): 3 mL via RESPIRATORY_TRACT
  Filled 2014-09-02 (×3): qty 3

## 2014-09-02 NOTE — Significant Event (Signed)
Rapid Response Event Note  Overview: Follow up on patient from earlier who MD was requesting BIPAP Time Called: 1335 Arrival Time: 1335 Event Type: Respiratory  Initial Focused Assessment:  On arrival patient sleeping comfortably in bed - resps reg and unlabored - arouses easily to name - oriented and co-op - states he feels fine - no SOB - NAD  RR 20 O2 sats 96% on 50% VM - patient states he wears 2 liters nasal cannula at home - his normal O2 sats when checked are 87-92.  Bil BS present - few exp wheezing NAD.  Warm and dry.  BP 105/55 HR 90.  Patient states he is nocturnal and sleeps all day.    Interventions:  Placed back on nasal cannula - resps remain unlabored - O2 sats 87-90% on 3 liters.  Placed on Bipap per RT.  Tol fair.  Handoff to Production assistant, radio.     Event Summary:   at      at          [place], Nena Jordan

## 2014-09-02 NOTE — Significant Event (Signed)
Rapid Response Event Note  Overview: Time Called: 3734 Arrival Time: 1335 Event Type: Respiratory  Initial Focused Assessment:   Interventions: 1455 Assisted with transport to 3S10. Patient remained alert and responsive to verbal stimuli, no resp distress noted. DOzed off and on but easily arousable. Sats on BIPAP 92-97%. Transported in bed, on monitor and on Oxygen. Will assist as needed.    Event Summary:    at      at          Baron Hamper

## 2014-09-02 NOTE — Progress Notes (Signed)
Patient: James Moon / Admit Date: 08/30/2014 / Date of Encounter: 09/02/2014, 10:04 AM   Subjective: Subjectively denies SOB or CP but having trouble maintaining oxygenation on [place] today - mid 64s, nurse initiating NRB.   Objective: Telemetry: NSR, ST, 8 beats irregular WCT overnight, brief run of what appears to be PAT yesterday PM Physical Exam: Blood pressure 82/58, pulse 100, temperature 98.1 F (36.7 C), temperature source Oral, resp. rate 19, height 5\' 10"  (1.778 m), weight 140 lb 12.8 oz (63.866 kg), SpO2 95 %. General: Thin chronically ill appearing WM in no acute distress. Head: Normocephalic, atraumatic, sclera non-icteric, no xanthomas, nares are without discharge. Neck: JVP not elevated. Lungs: Diminished throughout.. No rales or rhonchi. Breathing is unlabored. Heart: RRR S1 S2 without murmurs, rubs, or gallops.  Abdomen: Soft, non-tender, non-distended with normoactive bowel sounds. No rebound/guarding. Extremities: No clubbing or cyanosis. Trace-1+ BLE with significant erythema, unchanged Neuro: Alert and oriented X 3. Moves all extremities spontaneously. Psych: Responds to questions appropriately with a normal affect.   Intake/Output Summary (Last 24 hours) at 09/02/14 1004 Last data filed at 09/02/14 0853  Gross per 24 hour  Intake   1380 ml  Output   2551 ml  Net  -1171 ml    Inpatient Medications:  . aspirin EC  81 mg Oral Daily  . atorvastatin  40 mg Oral Daily  . budesonide-formoterol  2 puff Inhalation BID  . clopidogrel  75 mg Oral Daily  . collagenase   Topical Daily  . doxycycline  100 mg Oral Q12H  . furosemide  80 mg Intravenous BID  . heparin  5,000 Units Subcutaneous 3 times per day  . insulin aspart  0-15 Units Subcutaneous TID WC  . insulin aspart  0-5 Units Subcutaneous QHS  . insulin glargine  20 Units Subcutaneous Q2200  . Living Better with Heart Failure Book   Does not apply Once  . metoprolol succinate  25 mg Oral Daily  . sodium  chloride  3 mL Intravenous Q12H  . sodium chloride  3 mL Intravenous Q12H   Infusions:    Labs:  Recent Labs  09/01/14 0500 09/02/14 0411  NA 140 138  K 4.5 4.3  CL 93* 88*  CO2 40* 42*  GLUCOSE 108* 193*  BUN 37* 36*  CREATININE 1.27 1.42*  CALCIUM 9.2 9.0    Recent Labs  08/31/14 0420 09/01/14 0500  AST 24 21  ALT 18 15  ALKPHOS 57 54  BILITOT 0.7 1.0  PROT 6.7 5.9*  ALBUMIN 2.8* 2.6*    Recent Labs  09/01/14 0500 09/02/14 0411  WBC 7.5 6.5  NEUTROABS 6.1 5.2  HGB 11.8* 11.7*  HCT 41.8 40.6  MCV 83.3 81.5  PLT 114* 119*    Recent Labs  08/30/14 2203 08/31/14 0420 08/31/14 0950  TROPONINI 0.08* 0.08* 0.08*   Invalid input(s): POCBNP  Recent Labs  08/30/14 2203  HGBA1C 10.5*     Radiology/Studies:  Dg Chest 2 View  08/30/2014   CLINICAL DATA:  Leg swelling  EXAM: CHEST  2 VIEW  COMPARISON:  10/18/2013  FINDINGS: Stable positioning of right ventricular ICD/pacer lead.  Borderline cardiomegaly. The patient is status post CABG and coronary artery stenting. There is slight deviation of the trachea to the left, likely related to leftward rotation. Interlobular septal thickening with Kerley lines and trace pleural effusions. No convincing pneumonia.  IMPRESSION: Mild CHF.   Electronically Signed   By: Monte Fantasia M.D.   On: 08/30/2014  21:21   Dg Tibia/fibula Left  08/30/2014   CLINICAL DATA:  Bilateral leg swelling. Initial encounter. Symptoms for several months.  EXAM: LEFT TIBIA AND FIBULA - 2 VIEW  COMPARISON:  None.  FINDINGS: Proximal tibia and fibula appear within normal limits. Atherosclerosis small vessel atherosclerosis is present.  IMPRESSION: Negative.   Electronically Signed   By: Dereck Ligas M.D.   On: 08/30/2014 19:31   Dg Ankle Complete Left  08/30/2014   CLINICAL DATA:  Lower extremity edema for several months ; diabetes mellitus.  EXAM: LEFT ANKLE COMPLETE - 3+ VIEW  COMPARISON:  None.  FINDINGS: Frontal, oblique, and lateral views  were obtained. There is no fracture or effusion. No erosive change or bony destruction. Ankle mortise appears intact. There is extensive arterial vascular calcification.  IMPRESSION: No fracture. No bony destruction. Ankle mortise appears intact. Extensive arterial vascular calcification.   Electronically Signed   By: Lowella Grip III M.D.   On: 08/30/2014 19:34   Dg Foot Complete Left  08/30/2014   CLINICAL DATA:  Bilateral lower extremity leg swelling.  EXAM: LEFT FOOT - COMPLETE 3+ VIEW  COMPARISON:  Left ankle 08/30/2014  FINDINGS: Osteopenia in the forefoot. Evidence for vascular calcifications. Alignment of the foot is normal. Negative for a fracture or dislocation. Soft tissues are prominent along the dorsum of the foot.  IMPRESSION: No acute bone abnormality.   Electronically Signed   By: Markus Daft M.D.   On: 08/30/2014 19:28     Assessment and Plan  69 y/o M with history of CAD (CABG 1996, PCI in 2012, 2015), tobacco abuse, chronic resp failure 2/2 COPD, chronic systolic CHF, NSVT s/p ICD, pulm HTN, colon CA s/p colectomy who presented 08/31/14 with LEE and stasis ulcers in the setting of naproxen use and high salt intake. Historically variable EF - in 09/2013 was 40-45% by echo then 20% by cath at time of NSTEMI.  1. Lower extremity venous stasis/cellulitis - venous duplex negative 08/30/14 - seen by wound care who note they requested the medical team order ABI - IM has ordered - on doxycycline  2. Acute on chronic systolic CHF  - 2D Echo 08/05/34: EF 25-30%, mild LVH, akinesis of apex/anterior septum/anterior wall with no definite clot seen, mildly dilated RV with mildly decreased RV fcn, PASP 57mmHg, mod LAE - historical tendency for chronic hypotension, limiting the medications we can use  - not on ACEI/ARB due to recent AKI/hypotension - consider resumption of BB when BP allows - salt restriction reinforced and will order HF book to take home - creatinine bumped and BP soft - will  discuss plan for diuretics with MD, Lasix held this AM  3. Acute kidney injury - concern for potential worsening with hypotension thus will likely be holding antihypertensives this AM  4. Acute on chronic respiratory failure in the setting of COPD with chronic hypercarbia  5. History of CAD as above with minimally elevated troponin - EKG with inferior STT changes on admission - will clarify plan with MD - given flat trend and no chest pain, suspect TnI ? due to CHF/demand ischemia in setting of renal insufficiency - continue ASA/Plavix/statin  6. Chronic anemia/thrombocytopenia, per IM  Signed, Dayna Dunn PA-C As above, patient seen and examined. Patient denies dyspnea or chest pain. His oxygen saturations are decreased this morning. Check chest x-ray. His blood pressure is mildly reduced. Discontinue Toprol. Hold diuretics today and recheck potassium and renal function tomorrow. His edema has improved. We will  resume low-dose diuretics tomorrow. Possible discharge next 24-48 hours if he improves. James Moon

## 2014-09-02 NOTE — Progress Notes (Signed)
[facility] PROGRESS NOTE  James Moon BPZ:[id number] DOB: 1946-03-06 DOA: 08/30/2014 PCP: Lamar Blinks, MD Interim summary: 69 y.o admitted for sob and worsening LE edema. He was admitted for acute on chronic CHF, and acute renal failure.  Assessment/Plan:  Acute on chronic hypercarbic respiratory failure :   Acute on chronic systolic CHF exacerbation: last EF of 45%, repeat echocardiogram this admission showed worsening of his LVef to 25%. He was initially  admitted to telemetry, started him on IV lasix 80 mg BID by cardiology and he has put out net -1.8 lit from the time of admission. Earlier this am he was found to be hypotensive, hypoxic and lethargic. His BP medications, and lasix have been held today. He was on 2 LIT [place] oxygen and his oxygen level was increased to 50% to keep sats greater than 90%. An ABG was obtained showing a PH of 7.4,but Pco2 of 62.3 on 50% of oxygen. Ordered BIPAP and CXR was ordered by cardiology. Transferred the patient to step down for continuous BIPAP use and closer monitor. Labs revealed a bicarb of 42 today.  Recommend repeating ABG later this evening. And try to keep oxygen sats just around 90 to 91%. Resume bronchodilators, and symbicort.   Would resume cardiac meds, aspirin and plavix, lipitor and holding the lasix and BB for hypotension.  Repeat CXR reviewed, no new consolidation.   Chronic venous stasis changes over the lower extremities: Wound care consulted and recommendations given. ABI''s ordered. . Mild cellulitic changes, over the lower extremities, started him on doxycycline . His lower extremities remain the same. Would continue the doxycycline and ABI's are pending.   Outpatient wound care follow up with be arranged on discharge  Insulin dependent DM: CBG (last 3)   Recent Labs  09/02/14 0610 09/02/14 1121 09/02/14 1610  GLUCAP 281* 263* 175*   His CBG's are all greater than 200 except for this afternoon today, increase the  lantus from 20  to 24 units and add on 2 units of pre meal coverage. Resume SSI. hgba1c this admission is  10.5   Acute renal failure: suspect from using NSAIDs and fluid over load on admission.  Slightly worsening creatinine today when compared to yesterday, probably from volume depletion/ diuretics.  Holding lasix and BB today and recheck BMP later today. Ace inhibitor not on board for his renal insufficiency.    CAD: No chest pain or not in any distress from sob. He is comfortable. Resume cardiac meds, aspirin, plavix and lipitor.    Ischemic cardiomyopathy: Resume home meds. repeat echocardiogram ordered, showed LVEF of 25 to 30%, worsened when compared to 4/ 2015.  Further management as per cardiology.   Minimally elevated troponins: suspect from acute systolic heart failure. EKG on admission reviewed no new ischemic changes.  He denies any chest pain today.   Mild  chronic thrombocytopenia: on review of his old platelet counts, his counts this admission are better.   Mild anemia:normocytic, baseline hemoglobin around 14 last year. Will get anemia panel to check ferritin, iron stones and  Get stool for occult blood.    Hypotension: From from diuresis/ volume depletion. Holding diuretics today and repeat BP are slightly better 99/ 50.   Code Status: full code.  Family Communication: none at bedside Disposition Plan: home with home PT when his hypotension improves.     Consultants:  Cardiology  Wound care.   Procedures: Echo   Antibiotics:  Doxycycline.   HPI/Subjective: Lethargic but answering questions appropriately. His only  concern was going home. Told him his bp is too low to go home today.    Objective: Filed Vitals:   09/02/14 1645  BP:   Pulse:   Temp: 98.1 F (36.7 C)  Resp:     Intake/Output Summary (Last 24 hours) at 09/02/14 1804 Last data filed at 09/02/14 1202  Gross per 24 hour  Intake    943 ml  Output   1475 ml  Net   -532 ml    Filed Weights   08/31/14 0537 09/01/14 0422 09/02/14 0649  Weight: 68 kg (149 lb 14.6 oz) 65.59 kg (144 lb 9.6 oz) 63.866 kg (140 lb 12.8 oz)    Exam:   General:  Lethargic,  Afebrile does not appear in any distress.   Cardiovascular: s1s2. Slightly tachycardic.   Respiratory: scattered rales, no wheezing or rhonchi, fair air entry  Abdomen: soft non tender non distended. Bowel sounds normal  Musculoskeletal: chronic venous stasis changes and stable lesions on anterior shin with serous drainage.   Data Reviewed: Basic Metabolic Panel:  Recent Labs Lab 08/30/14 1712 08/30/14 2203 08/31/14 0420 09/01/14 0500 09/02/14 0411  NA 135  --  140 140 138  K 4.5  --  4.7 4.5 4.3  CL 92*  --  96 93* 88*  CO2 32  --  36* 40* 42*  GLUCOSE 361*  --  92 108* 193*  BUN 44*  --  38* 37* 36*  CREATININE 1.75* 1.64* 1.56* 1.27 1.42*  CALCIUM 8.5  --  9.2 9.2 9.0   Liver Function Tests:  Recent Labs Lab 08/31/14 0420 09/01/14 0500  AST 24 21  ALT 18 15  ALKPHOS 57 54  BILITOT 0.7 1.0  PROT 6.7 5.9*  ALBUMIN 2.8* 2.6*   No results for input(s): LIPASE, AMYLASE in the last 168 hours. No results for input(s): AMMONIA in the last 168 hours. CBC:  Recent Labs Lab 08/30/14 1712 08/30/14 2203 08/31/14 0420 09/01/14 0500 09/02/14 0411  WBC 6.0 6.5 7.1 7.5 6.5  NEUTROABS  --   --  5.2 6.1 5.2  HGB 12.5* 12.9* 12.5* 11.8* 11.7*  HCT 42.8 43.6 44.6 41.8 40.6  MCV 82.3 81.5 83.8 83.3 81.5  PLT 129* 122* 134* 114* 119*   Cardiac Enzymes:  Recent Labs Lab 08/30/14 2203 08/31/14 0420 08/31/14 0950  TROPONINI 0.08* 0.08* 0.08*   BNP (last 3 results)  Recent Labs  08/30/14 1742  BNP 547.1*    ProBNP (last 3 results)  Recent Labs  10/15/13 1503  PROBNP 2626.0*    CBG:  Recent Labs Lab 09/02/14 0024 09/02/14 0353 09/02/14 0610 09/02/14 1121 09/02/14 1610  GLUCAP 218* 200* 281* 263* 175*    No results found for this or any previous visit (from the  past 240 hour(s)).   Studies: Dg Chest Port 1 View  09/02/2014   CLINICAL DATA:  Hypoxia  EXAM: PORTABLE CHEST - 1 VIEW  COMPARISON:  08/30/2014  FINDINGS: Cardiomediastinal silhouette is stable. Status post CABG. Single lead cardiac pacemaker is unchanged in position. No acute infiltrate or pleural effusion. No pulmonary edema. Thoracic spine osteopenia.  IMPRESSION: No active disease.  Single lead cardiac pacemaker in place.   Electronically Signed   By: Lahoma Crocker M.D.   On: 09/02/2014 11:47    Scheduled Meds: . aspirin EC  81 mg Oral Daily  . atorvastatin  40 mg Oral Daily  . budesonide-formoterol  2 puff Inhalation BID  . clopidogrel  75 mg Oral Daily  .  collagenase   Topical Daily  . doxycycline  100 mg Oral Q12H  . heparin  5,000 Units Subcutaneous 3 times per day  . insulin aspart  0-15 Units Subcutaneous TID WC  . insulin aspart  0-5 Units Subcutaneous QHS  . [START ON 09/03/2014] insulin aspart  2 Units Subcutaneous TID WC  . insulin glargine  24 Units Subcutaneous Q2200  . ipratropium-albuterol  3 mL Nebulization Q4H  . Living Better with Heart Failure Book   Does not apply Once  . sodium chloride  3 mL Intravenous Q12H  . sodium chloride  3 mL Intravenous Q12H   Continuous Infusions:   Active Problems:   IDDM (insulin dependent diabetes mellitus)   COPD GOLD III   Chronic combined systolic and diastolic CHF (congestive heart failure)   Leg swelling   AKI (acute kidney injury)   Rash   Acute kidney injury    Time spent: 35 minutes     [facility] Pager [phone]  If 7PM-7AM, please contact night-coverage at [email or web address], password [facility] 09/02/2014, 6:04 PM  LOS: 3 days

## 2014-09-02 NOTE — Progress Notes (Signed)
Patient is A/Ox4 and is ambulatory with 1 person standby assist. Early this am during breakfast patient's BP was taken prior to morning medication and was 82/52 manually and oxygen saturation was 83-87% on 2 L [place]. Oxygen was increased to 3 L [place] and saturations increased to 90-93% sustained.  Attending MD, Dr.Akula was paged and notified. Medications were held. Was told to recheck blood pressure in about an  hour and call back. Rechecked BP it was 92/56 manually. Oxygen level was checked again too and this time it was 83-87% on 3 L [place], increased to 4 L [place] and oxygen saturation would increase to 88-89% but would desaturate to 84-87%. Patient denied any shortness of breath and did not appear to be in any distress. Paged attending MD again to notify. Placed patient on a non-rebreather and called respiratory thearpy. Was told by RT to place patient on venturi-mask at 50%. Patient remained A/Ox4 and had no complaints of SOB or distress throughout the situation . Called rapid response to notify of situation. On ventri-mask oxygen level increased to 95-100%. New order written for ABG. RT came to assess. Rapid response came to assess. MD came to assess. ABG results came back. New orders written for continuous bi-pap. Was told by rapid response that once stepdown bed becomes available then patient could be transferred. Called unit 3 S when bed became available and gave report to the RN receiving the pateint. When bed was available patient was transported by rapid response, RT, and charge nurse.

## 2014-09-02 NOTE — Progress Notes (Signed)
Pt requesting to d/c Bipap. Paged Karleen Hampshire, MD. Says it is okay to remove Bipap and place pt on O2 via nasal cannula, keep sats around 90. Pt has no c/o of SOB, no signs of resp distressed at this time. Will continue to monitor.

## 2014-09-02 NOTE — Progress Notes (Signed)
Inpatient Diabetes Program Recommendations  AACE/ADA: New Consensus Statement on Inpatient Glycemic Control (2013)  Target Ranges:  Prepandial:   less than 140 mg/dL      Peak postprandial:   less than 180 mg/dL (1-2 hours)      Critically ill patients:  140 - 180 mg/dL    Results for [name], [name] (MRN [id number]) as of 09/02/2014 09:05  Ref. Range 09/01/2014 16:27 09/01/2014 20:36 09/02/2014 00:24 09/02/2014 03:53 09/02/2014 06:10  Glucose-Capillary Latest Range: 70-99 mg/dL 347 (H) 249 (H) 218 (H) 200 (H) 281 (H)    Diabetes history: DM 2 Outpatient Diabetes medications: Lantus 20-24 units QDay, 70/30 3 units TID Current orders for Inpatient glycemic control: Lantus 20 units QHS, Novolog 0-15 units TID, Novolog 0-5 units QHS  Inpatient Diabetes Program Recommendations Insulin - Basal: Patient's fasting glucose this am was 281mg /dl. Patient takes up to Lantus 24 units at home. Please consider increasing basal insulin to Lantus 25 units QHS.  Insulin - Meal Coverage: Patient's glucose increases around meal times. Please consider Novolog 3 units TID with meals for meal coverage in addition to correction scale if patient consumes at least 50% of meals.   Thanks,  Tama Headings RN, MSN, [facility] Inpatient Diabetes Coordinator Team Pager [phone]

## 2014-09-02 NOTE — Progress Notes (Signed)
PT Cancellation Note  Patient Details Name: James Moon MRN: [id number] DOB: December 31, 1945   Cancelled Treatment:    Reason Eval/Treat Not Completed: Medical issues which prohibited therapy (pt with rapid response and on bipap)   Aylan Bayona 09/02/2014, 1:59 PM  [facility] PT [phone]

## 2014-09-02 NOTE — Progress Notes (Signed)
PT refuses ABG. Pt stated "Absolutely Not".  Pt is eating dinner, on  4L [place], alert and oriented SPO2 95%.

## 2014-09-02 NOTE — Progress Notes (Signed)
Pt resting comfortably upon arrival. Discussed pt's state of breathing for the day, and states he feels much better. BBS clear/dimished on 3Lpm (home regimen is 2lpm), and sa97%. CXR shows no edema/vascualar changes. Pt states that he doesn't want to wear his BiPAP, and denies any SOB. Refuses resp meds.  Most recent ABG values are inline for a pt in his current situation.   ABG    Component Value Date/Time   PHART 7.433 09/02/2014 1055   PCO2ART 62.3* 09/02/2014 1055   PO2ART 81.1 09/02/2014 1055   HCO3 40.9* 09/02/2014 1055   TCO2 42.8 09/02/2014 1055   O2SAT 95.4 09/02/2014 1055   HH: 7.44  Temp:  [97.4 F (36.3 C)-98.1 F (36.7 C)] 98.1 F (36.7 C) (03/04 1645) Pulse Rate:  [84-100] 86 (03/04 1516) Resp:  [14-20] 14 (03/04 1516) BP: (82-101)/(51-77) 99/51 mmHg (03/04 1504) SpO2:  [89 %-100 %] 94 % (03/04 1516) FiO2 (%):  [50 %] 50 % (03/04 1024) Weight:  [140 lb 12.8 oz (63.866 kg)-154 lb 5.2 oz (70 kg)] 154 lb 5.2 oz (70 kg) (03/04 1504)

## 2014-09-03 DIAGNOSIS — I739 Peripheral vascular disease, unspecified: Secondary | ICD-10-CM

## 2014-09-03 DIAGNOSIS — M7989 Other specified soft tissue disorders: Secondary | ICD-10-CM

## 2014-09-03 LAB — CBC WITH DIFFERENTIAL/PLATELET
BASOS ABS: 0 10*3/uL (ref 0.0–0.1)
Basophils Relative: 1 % (ref 0–1)
Eosinophils Absolute: 0 10*3/uL (ref 0.0–0.7)
Eosinophils Relative: 1 % (ref 0–5)
HCT: 40.3 % (ref 39.0–52.0)
Hemoglobin: 11.6 g/dL — ABNORMAL LOW (ref 13.0–17.0)
LYMPHS PCT: 9 % — AB (ref 12–46)
Lymphs Abs: 0.4 10*3/uL — ABNORMAL LOW (ref 0.7–4.0)
MCH: 23.9 pg — ABNORMAL LOW (ref 26.0–34.0)
MCHC: 28.8 g/dL — ABNORMAL LOW (ref 30.0–36.0)
MCV: 83.1 fL (ref 78.0–100.0)
Monocytes Absolute: 0.6 10*3/uL (ref 0.1–1.0)
Monocytes Relative: 13 % — ABNORMAL HIGH (ref 3–12)
Neutro Abs: 3.8 10*3/uL (ref 1.7–7.7)
Neutrophils Relative %: 76 % (ref 43–77)
Platelets: 117 10*3/uL — ABNORMAL LOW (ref 150–400)
RBC: 4.85 MIL/uL (ref 4.22–5.81)
RDW: 20.3 % — ABNORMAL HIGH (ref 11.5–15.5)
WBC: 4.8 10*3/uL (ref 4.0–10.5)

## 2014-09-03 LAB — BASIC METABOLIC PANEL
Anion gap: 7 (ref 5–15)
BUN: 30 mg/dL — AB (ref 6–23)
CO2: 39 mmol/L — AB (ref 19–32)
CREATININE: 1.31 mg/dL (ref 0.50–1.35)
Calcium: 8.6 mg/dL (ref 8.4–10.5)
Chloride: 90 mmol/L — ABNORMAL LOW (ref 96–112)
GFR calc Af Amer: 63 mL/min — ABNORMAL LOW (ref >90)
GFR calc non Af Amer: 54 mL/min — ABNORMAL LOW (ref >90)
GLUCOSE: 332 mg/dL — AB (ref 70–99)
Potassium: 4.6 mmol/L (ref 3.5–5.1)
Sodium: 136 mmol/L (ref 135–145)

## 2014-09-03 LAB — IRON AND TIBC
IRON: 81 ug/dL (ref 42–165)
SATURATION RATIOS: 25 % (ref 20–55)
TIBC: 323 ug/dL (ref 215–435)
UIBC: 242 ug/dL (ref 125–400)

## 2014-09-03 LAB — GLUCOSE, CAPILLARY
Glucose-Capillary: 313 mg/dL — ABNORMAL HIGH (ref 70–99)
Glucose-Capillary: 179 mg/dL — ABNORMAL HIGH (ref 70–99)

## 2014-09-03 LAB — RETICULOCYTES
RBC.: 4.85 MIL/uL (ref 4.22–5.81)
RETIC COUNT ABSOLUTE: 63.1 10*3/uL (ref 19.0–186.0)
Retic Ct Pct: 1.3 % (ref 0.4–3.1)

## 2014-09-03 LAB — PHOSPHORUS: Phosphorus: 3 mg/dL (ref 2.3–4.6)

## 2014-09-03 LAB — FERRITIN: Ferritin: 38 ng/mL (ref 22–322)

## 2014-09-03 LAB — MAGNESIUM: Magnesium: 1.9 mg/dL (ref 1.5–2.5)

## 2014-09-03 LAB — VITAMIN B12: Vitamin B-12: 480 pg/mL (ref 211–911)

## 2014-09-03 LAB — FOLATE: FOLATE: 3.4 ng/mL

## 2014-09-03 MED ORDER — METOPROLOL SUCCINATE 12.5 MG HALF TABLET
12.5000 mg | ORAL_TABLET | Freq: Every day | ORAL | Status: DC
  Administered 2014-09-03: 12.5 mg via ORAL
  Filled 2014-09-03: qty 1

## 2014-09-03 MED ORDER — CETYLPYRIDINIUM CHLORIDE 0.05 % MT LIQD
7.0000 mL | Freq: Two times a day (BID) | OROMUCOSAL | Status: DC
  Administered 2014-09-03: 7 mL via OROMUCOSAL

## 2014-09-03 MED ORDER — IPRATROPIUM-ALBUTEROL 0.5-2.5 (3) MG/3ML IN SOLN: 3.0000 mL | Freq: Four times a day (QID) | RESPIRATORY_TRACT | Status: DC

## 2014-09-03 MED ORDER — COLLAGENASE 250 UNIT/GM EX OINT: TOPICAL_OINTMENT | Freq: Every day | CUTANEOUS | Status: DC

## 2014-09-03 MED ORDER — DOXYCYCLINE HYCLATE 100 MG PO TABS: 100.0000 mg | ORAL_TABLET | Freq: Two times a day (BID) | ORAL | Status: DC

## 2014-09-03 MED ORDER — IPRATROPIUM-ALBUTEROL 0.5-2.5 (3) MG/3ML IN SOLN: 3.0000 mL | RESPIRATORY_TRACT | Status: DC | PRN

## 2014-09-03 MED ORDER — FUROSEMIDE 40 MG PO TABS
40.0000 mg | ORAL_TABLET | Freq: Two times a day (BID) | ORAL | Status: DC
  Administered 2014-09-03: 40 mg via ORAL
  Filled 2014-09-03 (×3): qty 1

## 2014-09-03 MED ORDER — INSULIN ASPART 100 UNIT/ML ~~LOC~~ SOLN: SUBCUTANEOUS | Status: DC

## 2014-09-03 MED ORDER — IPRATROPIUM-ALBUTEROL 0.5-2.5 (3) MG/3ML IN SOLN: 3.0000 mL | Freq: Four times a day (QID) | RESPIRATORY_TRACT | Status: AC | PRN

## 2014-09-03 MED ORDER — METOPROLOL SUCCINATE ER 25 MG PO TB24: 12.5000 mg | ORAL_TABLET | Freq: Every day | ORAL | Status: DC

## 2014-09-03 NOTE — Progress Notes (Signed)
Patient: James Moon / Admit Date: 08/30/2014 / Date of Encounter: 09/03/2014, 9:06 AM   Subjective: Denies dyspnea or chest pain   Objective: Physical Exam: Blood pressure 116/60, pulse 101, temperature 99 F (37.2 C), temperature source Oral, resp. rate 22, height 5\' 10"  (1.778 m), weight 154 lb 5.2 oz (70 kg), SpO2 95 %. General: Thin chronically ill appearing WM in no acute distress. Head: Normal Neck: supple Lungs: Diminished throughout.Marland Kitchen Heart: RRR  Abdomen: Soft, non-tender, non-distended Extremities: Trace edema with chronic skin changes Neuro: Grossly intact    Intake/Output Summary (Last 24 hours) at 09/03/14 0906 Last data filed at 09/03/14 0700  Gross per 24 hour  Intake    726 ml  Output    725 ml  Net      1 ml    Inpatient Medications:  . antiseptic oral rinse  7 mL Mouth Rinse BID  . aspirin EC  81 mg Oral Daily  . atorvastatin  40 mg Oral Daily  . budesonide-formoterol  2 puff Inhalation BID  . clopidogrel  75 mg Oral Daily  . collagenase   Topical Daily  . doxycycline  100 mg Oral Q12H  . heparin  5,000 Units Subcutaneous 3 times per day  . insulin aspart  0-15 Units Subcutaneous TID WC  . insulin aspart  0-5 Units Subcutaneous QHS  . insulin aspart  2 Units Subcutaneous TID WC  . insulin glargine  24 Units Subcutaneous Q2200  . ipratropium-albuterol  3 mL Nebulization QID  . Living Better with Heart Failure Book   Does not apply Once  . sodium chloride  3 mL Intravenous Q12H  . sodium chloride  3 mL Intravenous Q12H   Infusions:    Labs:  Recent Labs  09/02/14 1851 09/03/14 0236  NA 139 136  K 4.7 4.6  CL 88* 90*  CO2 41* 39*  GLUCOSE 223* 332*  BUN 32* 30*  CREATININE 1.35 1.31  CALCIUM 8.9 8.6  MG  --  1.9  PHOS  --  3.0    Recent Labs  09/01/14 0500  AST 21  ALT 15  ALKPHOS 54  BILITOT 1.0  PROT 5.9*  ALBUMIN 2.6*    Recent Labs  09/02/14 0411 09/03/14 0236  WBC 6.5 4.8  NEUTROABS 5.2 3.8  HGB 11.7* 11.6*    HCT 40.6 40.3  MCV 81.5 83.1  PLT 119* 117*    Recent Labs  08/31/14 0950  TROPONINI 0.08*     Radiology/Studies:  Dg Chest 2 View  08/30/2014   CLINICAL DATA:  Leg swelling  EXAM: CHEST  2 VIEW  COMPARISON:  10/18/2013  FINDINGS: Stable positioning of right ventricular ICD/pacer lead.  Borderline cardiomegaly. The patient is status post CABG and coronary artery stenting. There is slight deviation of the trachea to the left, likely related to leftward rotation. Interlobular septal thickening with Kerley lines and trace pleural effusions. No convincing pneumonia.  IMPRESSION: Mild CHF.   Electronically Signed   By: Monte Fantasia M.D.   On: 08/30/2014 21:21   Dg Tibia/fibula Left  08/30/2014   CLINICAL DATA:  Bilateral leg swelling. Initial encounter. Symptoms for several months.  EXAM: LEFT TIBIA AND FIBULA - 2 VIEW  COMPARISON:  None.  FINDINGS: Proximal tibia and fibula appear within normal limits. Atherosclerosis small vessel atherosclerosis is present.  IMPRESSION: Negative.   Electronically Signed   By: Dereck Ligas M.D.   On: 08/30/2014 19:31   Dg Ankle Complete Left  08/30/2014  CLINICAL DATA:  Lower extremity edema for several months ; diabetes mellitus.  EXAM: LEFT ANKLE COMPLETE - 3+ VIEW  COMPARISON:  None.  FINDINGS: Frontal, oblique, and lateral views were obtained. There is no fracture or effusion. No erosive change or bony destruction. Ankle mortise appears intact. There is extensive arterial vascular calcification.  IMPRESSION: No fracture. No bony destruction. Ankle mortise appears intact. Extensive arterial vascular calcification.   Electronically Signed   By: Lowella Grip III M.D.   On: 08/30/2014 19:34   Dg Chest Port 1 View  09/02/2014   CLINICAL DATA:  Hypoxia  EXAM: PORTABLE CHEST - 1 VIEW  COMPARISON:  08/30/2014  FINDINGS: Cardiomediastinal silhouette is stable. Status post CABG. Single lead cardiac pacemaker is unchanged in position. No acute infiltrate or  pleural effusion. No pulmonary edema. Thoracic spine osteopenia.  IMPRESSION: No active disease.  Single lead cardiac pacemaker in place.   Electronically Signed   By: Lahoma Crocker M.D.   On: 09/02/2014 11:47   Dg Foot Complete Left  08/30/2014   CLINICAL DATA:  Bilateral lower extremity leg swelling.  EXAM: LEFT FOOT - COMPLETE 3+ VIEW  COMPARISON:  Left ankle 08/30/2014  FINDINGS: Osteopenia in the forefoot. Evidence for vascular calcifications. Alignment of the foot is normal. Negative for a fracture or dislocation. Soft tissues are prominent along the dorsum of the foot.  IMPRESSION: No acute bone abnormality.   Electronically Signed   By: Markus Daft M.D.   On: 08/30/2014 19:28     Assessment and Plan  69 y/o M with history of CAD (CABG 1996, PCI in 2012, 2015), tobacco abuse, chronic resp failure 2/2 COPD, chronic systolic CHF, NSVT s/p ICD, pulm HTN, colon CA s/p colectomy who presented 08/31/14 with LEE and stasis ulcers in the setting of naproxen use and high salt intake. Historically variable EF - in 09/2013 was 40-45% by echo then 20% by cath at time of NSTEMI.  1. Lower extremity venous stasis/cellulitis - venous duplex negative 08/30/14 - seen by wound care who note they requested the medical team order ABI - IM has ordered - on doxycycline  2. Acute on chronic systolic CHF  - 2D Echo 11/01/67: EF 25-30%, mild LVH, akinesis of apex/anterior septum/anterior wall with no definite clot seen, mildly dilated RV with mildly decreased RV fcn, PASP 33mmHg, mod LAE - historical tendency for chronic hypotension, limiting the medications we can use  - not on ACEI/ARB due to recent AKI/hypotension - salt restriction reinforced and will order HF book to take home - resume toprol 12.5 mg daily and lasix 40 mg PO BID; will need fu of renal function following DC  4. Acute on chronic respiratory failure in the setting of COPD with chronic hypercarbia - Per IM  5. History of CAD - continue  ASA/Plavix/statin  6. Chronic anemia/thrombocytopenia, per IM Patient can be DCed from a cardiac standpoint and FU Dr Acie Fredrickson Signed, Kirk Ruths

## 2014-09-03 NOTE — Discharge Summary (Signed)
Physician Discharge Summary  James Moon SFK:[id number] DOB: April 30, 1946 DOA: 08/30/2014  PCP: Lamar Blinks, MD  Admit date: 08/30/2014 Discharge date: 09/03/2014  Time spent: 30 minutes  Recommendations for Outpatient Follow-up:  1. Follow up withPCP in 2 weeks.  2. Follow up with cardiology as recommended.   Discharge Diagnoses:  Active Problems:   IDDM (insulin dependent diabetes mellitus)   COPD GOLD III   Chronic combined systolic and diastolic CHF (congestive heart failure)   Leg swelling   AKI (acute kidney injury)   Rash   Acute kidney injury   Discharge Condition: improved  Diet recommendation: carb modified, low sodium diet.   Filed Weights   09/01/14 0422 09/02/14 0649 09/02/14 1504  Weight: 65.59 kg (144 lb 9.6 oz) 63.866 kg (140 lb 12.8 oz) 70 kg (154 lb 5.2 oz)    History of present illness:  69 y.o admitted for sob and worsening LE edema. He was admitted for acute on chronic CHF, and acute renal failure.   Hospital Course:   Acute on chronic hypercarbic respiratory failure :  Acute on chronic systolic CHF exacerbation: last EF of 45%, repeat echocardiogram this admission showed worsening of his LVef to 25%. He was initially  admitted to telemetry, started him on IV lasix 80 mg BID by cardiology and he has put out net -1.8 lit from the time of admission.  Would resume cardiac meds, aspirin and plavix, lipitor and discharged the patient on lasix 40 mg BID.   Repeat CXR reviewed, no new consolidation.   Chronic venous stasis changes over the lower extremities: Wound care consulted and recommendations given. ABI''s ordered. . Mild cellulitic changes, over the lower extremities, started him on doxycycline . His lower extremities remain the same. Would continue the doxycycline to completed the course. And please obtain ABI'S as outpatient.   Outpatient wound care follow up with be arranged on discharge  Insulin dependent DM: CBG (last 3)   Recent Labs  (last 2 labs)      Recent Labs  09/02/14 0610 09/02/14 1121 09/02/14 1610  GLUCAP 281* 263* 175*     His CBG's are all greater than 200 except for this afternoon today, increase the lantus from 20 to 24 units and add on 2 units of pre meal coverage. Resume SSI. hgba1c this admission is 10.5   Acute renal failure: suspect from using NSAIDs and fluid over load on admission.  Improved.    CAD: No chest pain or not in any distress from sob. He is comfortable. Resume cardiac meds, aspirin, plavix and lipitor.    Ischemic cardiomyopathy: Resume home meds. repeat echocardiogram ordered, showed LVEF of 25 to 30%, worsened when compared to 4/ 2015.  Further management as per cardiology.   Minimally elevated troponins: suspect from acute systolic heart failure. EKG on admission reviewed no new ischemic changes. He denies any chest pain today.   Mild chronic thrombocytopenia: on review of his old platelet counts, his counts this admission are better.   Mild anemia:normocytic, baseline hemoglobin around 14 last year. Will get anemia panel to check ferritin, iron stones and Get stool for occult blood.    Hypotension: From from diuresis/ volume depletion. resolved       Procedures:  echocardiogram  Consultations:  cardiology  Discharge Exam: Filed Vitals:   09/03/14 1518  BP: 97/50  Pulse: 92  Temp:   Resp: 24    General: alert afebrile comfortable Cardiovascular: s1s2 Respiratory: ctab  Discharge Instructions   Discharge Instructions    (  HEART FAILURE PATIENTS) Call MD:  Anytime you have any of the following symptoms: 1) 3 pound weight gain in 24 hours or 5 pounds in 1 week 2) shortness of breath, with or without a dry hacking cough 3) swelling in the hands, feet or stomach 4) if you have to sleep on extra pillows at night in order to breathe.    Complete by:  As directed      Diet - low sodium heart healthy    Complete by:  As directed       Discharge instructions    Complete by:  As directed   Follow up with PCP in 2 week.s  Please follow up with Dr Acie Fredrickson  in 2 weeks.  Please check BMP in one week.  Please follow up with wound care clinic as recommended on Monday.  Please get ABI's done by your PCP.          Discharge Medication List as of 09/03/2014  3:07 PM    START taking these medications   Details  collagenase (SANTYL) ointment Apply topically daily., Starting 09/03/2014, Until Discontinued, Print    doxycycline (VIBRA-TABS) 100 MG tablet Take 1 tablet (100 mg total) by mouth every 12 (twelve) hours., Starting 09/03/2014, Until Discontinued, Print    insulin aspart (NOVOLOG) 100 UNIT/ML injection CBG 70 - 120: 0 units CBG 121 - 150: 2 units CBG 151 - 200: 3 units CBG 201 - 250: 5 units CBG 251 - 300: 8 units CBG 301 - 350: 11 units CBG 351 - 400: 15 units, Print    ipratropium-albuterol (DUONEB) 0.5-2.5 (3) MG/3ML SOLN Take 3 mLs by nebulization every 6 (six) hours as needed., Starting 09/03/2014, Until Discontinued, Print      CONTINUE these medications which have CHANGED   Details  metoprolol succinate (TOPROL-XL) 25 MG 24 hr tablet Take 0.5 tablets (12.5 mg total) by mouth daily., Starting 09/03/2014, Until Discontinued, Print      CONTINUE these medications which have NOT CHANGED   Details  aspirin EC 81 MG tablet Take 81 mg by mouth daily. , Until Discontinued, Historical Med    atorvastatin (LIPITOR) 40 MG tablet TAKE ONE TABLET BY MOUTH ONCE DAILY, Normal    budesonide-formoterol (SYMBICORT) 160-4.5 MCG/ACT inhaler Inhale 2 puffs into the lungs 2 (two) times daily., Starting 10/22/2013, Until Discontinued, Normal    clopidogrel (PLAVIX) 75 MG tablet TAKE ONE TABLET BY MOUTH ONCE DAILY, Normal    furosemide (LASIX) 40 MG tablet Take 1 tablet (40 mg total) by mouth 2 (two) times daily., Starting 07/06/2014, Until Discontinued, Normal    Insulin Glargine (LANTUS SOLOSTAR) 100 UNIT/ML Solostar Pen Inject 20  Units into the skin daily at 10 pm., Starting 10/22/2013, Until Discontinued, Normal    insulin NPH-regular Human (NOVOLIN 70/30) (70-30) 100 UNIT/ML injection Inject 3 Units into the skin every 8 (eight) hours., Until Discontinued, Historical Med    Insulin Syringes, Disposable, U-100 0.3 ML MISC 1 Syringe by Does not apply route 3 (three) times daily., Starting 10/22/2013, Until Discontinued, Normal    NEEDLE, DISP, 22 G (B-D DISP NEEDLE 22GX3/4") 22G X 3/4" MISC 1 Syringe by Does not apply route daily., Starting 10/22/2013, Until Discontinued, Normal    nitroGLYCERIN (NITROSTAT) 0.4 MG SL tablet Place 1 tablet (0.4 mg total) under the tongue every 5 (five) minutes as needed for chest pain. For chest pain, Starting 08/22/2014, Until Discontinued, Normal      STOP taking these medications     Naproxen Sodium (  ALEVE PO)        No Known Allergies Follow-up Information    Follow up with [place]             .   Why:  Call to make an appointment as soon as possible.   Contact information:   [address] [place] [id number] [phone]       The results of significant diagnostics from this hospitalization (including imaging, microbiology, ancillary and laboratory) are listed below for reference.    Significant Diagnostic Studies: Dg Chest 2 View  08/30/2014   CLINICAL DATA:  Leg swelling  EXAM: CHEST  2 VIEW  COMPARISON:  10/18/2013  FINDINGS: Stable positioning of right ventricular ICD/pacer lead.  Borderline cardiomegaly. The patient is status post CABG and coronary artery stenting. There is slight deviation of the trachea to the left, likely related to leftward rotation. Interlobular septal thickening with Kerley lines and trace pleural effusions. No convincing pneumonia.  IMPRESSION: Mild CHF.   Electronically Signed   By: Monte Fantasia M.D.   On: 08/30/2014 21:21   Dg Tibia/fibula Left  08/30/2014   CLINICAL DATA:   Bilateral leg swelling. Initial encounter. Symptoms for several months.  EXAM: LEFT TIBIA AND FIBULA - 2 VIEW  COMPARISON:  None.  FINDINGS: Proximal tibia and fibula appear within normal limits. Atherosclerosis small vessel atherosclerosis is present.  IMPRESSION: Negative.   Electronically Signed   By: Dereck Ligas M.D.   On: 08/30/2014 19:31   Dg Ankle Complete Left  08/30/2014   CLINICAL DATA:  Lower extremity edema for several months ; diabetes mellitus.  EXAM: LEFT ANKLE COMPLETE - 3+ VIEW  COMPARISON:  None.  FINDINGS: Frontal, oblique, and lateral views were obtained. There is no fracture or effusion. No erosive change or bony destruction. Ankle mortise appears intact. There is extensive arterial vascular calcification.  IMPRESSION: No fracture. No bony destruction. Ankle mortise appears intact. Extensive arterial vascular calcification.   Electronically Signed   By: Lowella Grip III M.D.   On: 08/30/2014 19:34   Dg Chest Port 1 View  09/02/2014   CLINICAL DATA:  Hypoxia  EXAM: PORTABLE CHEST - 1 VIEW  COMPARISON:  08/30/2014  FINDINGS: Cardiomediastinal silhouette is stable. Status post CABG. Single lead cardiac pacemaker is unchanged in position. No acute infiltrate or pleural effusion. No pulmonary edema. Thoracic spine osteopenia.  IMPRESSION: No active disease.  Single lead cardiac pacemaker in place.   Electronically Signed   By: Lahoma Crocker M.D.   On: 09/02/2014 11:47   Dg Foot Complete Left  08/30/2014   CLINICAL DATA:  Bilateral lower extremity leg swelling.  EXAM: LEFT FOOT - COMPLETE 3+ VIEW  COMPARISON:  Left ankle 08/30/2014  FINDINGS: Osteopenia in the forefoot. Evidence for vascular calcifications. Alignment of the foot is normal. Negative for a fracture or dislocation. Soft tissues are prominent along the dorsum of the foot.  IMPRESSION: No acute bone abnormality.   Electronically Signed   By: Markus Daft M.D.   On: 08/30/2014 19:28    Microbiology: No results found for this  or any previous visit (from the past 240 hour(s)).   Labs: Basic Metabolic Panel:  Recent Labs Lab 08/31/14 0420 09/01/14 0500 09/02/14 0411 09/02/14 1851 09/03/14 0236  NA 140 140 138 139 136  K 4.7 4.5 4.3 4.7 4.6  CL 96 93* 88* 88* 90*  CO2 36* 40* 42* 41* 39*  GLUCOSE 92 108*  193* 223* 332*  BUN 38* 37* 36* 32* 30*  CREATININE 1.56* 1.27 1.42* 1.35 1.31  CALCIUM 9.2 9.2 9.0 8.9 8.6  MG  --   --   --   --  1.9  PHOS  --   --   --   --  3.0   Liver Function Tests:  Recent Labs Lab 08/31/14 0420 09/01/14 0500  AST 24 21  ALT 18 15  ALKPHOS 57 54  BILITOT 0.7 1.0  PROT 6.7 5.9*  ALBUMIN 2.8* 2.6*   No results for input(s): LIPASE, AMYLASE in the last 168 hours. No results for input(s): AMMONIA in the last 168 hours. CBC:  Recent Labs Lab 08/30/14 2203 08/31/14 0420 09/01/14 0500 09/02/14 0411 09/03/14 0236  WBC 6.5 7.1 7.5 6.5 4.8  NEUTROABS  --  5.2 6.1 5.2 3.8  HGB 12.9* 12.5* 11.8* 11.7* 11.6*  HCT 43.6 44.6 41.8 40.6 40.3  MCV 81.5 83.8 83.3 81.5 83.1  PLT 122* 134* 114* 119* 117*   Cardiac Enzymes:  Recent Labs Lab 08/30/14 2203 08/31/14 0420 08/31/14 0950  TROPONINI 0.08* 0.08* 0.08*   BNP: BNP (last 3 results)  Recent Labs  08/30/14 1742  BNP 547.1*    ProBNP (last 3 results)  Recent Labs  10/15/13 1503  PROBNP 2626.0*    CBG:  Recent Labs Lab 09/02/14 1121 09/02/14 1610 09/02/14 2200 09/03/14 0822 09/03/14 1217  GLUCAP 263* 175* 287* 313* 179*       Signed:  Mayo Owczarzak  [facility] 09/05/2014, 7:37 PM

## 2014-09-03 NOTE — Progress Notes (Signed)
VASCULAR LAB PRELIMINARY  ARTERIAL  ABI completed: Bilaterally ABI is non-compressible. Waveforms indicate adequate arterial flow.    RIGHT    LEFT    PRESSURE WAVEFORM  PRESSURE WAVEFORM  BRACHIAL 103 Tri BRACHIAL    DP   DP    AT Non comp Bi AT Could not obtain   PT Non comp Bi PT Non comp Bi  PER   PER    GREAT TOE  NA GREAT TOE  NA    RIGHT LEFT  ABI Non-compressible Non-compressible     Landry Mellow, RDMS, RVT  09/03/2014, 2:51 PM

## 2014-09-03 NOTE — Progress Notes (Signed)
Received orders to D/C pt home. No s/s of acute distress noted. D/C instructions given to pt and pt's daughter, both verbalized understanding of instructions. Pt d/c'd to home accompanied by daughter.

## 2014-09-04 NOTE — Care Management Note (Signed)
    Page 1 of 1   09/04/2014     9:20:08 AM CARE MANAGEMENT NOTE 09/04/2014  Patient:  REYAAN, THOMA   Account Number:  [id number]  Date Initiated:  09/03/2014  Documentation initiated by:  [facility]  Subjective/Objective Assessment:   adm: sob and worsening LE edema. He was admitted for acute on chronic CHF, and acute renal failure.     Action/Plan:   discharge planning   Anticipated DC Date:  09/03/2014   Anticipated DC Plan:  HOME/SELF CARE         Choice offered to / List presented to:             Status of service:  Completed, signed off Medicare Important Message given?   (If response is "NO", the following Medicare IM given date fields will be blank) Date Medicare IM given:   Medicare IM given by:   Date Additional Medicare IM given:   Additional Medicare IM given by:    Discharge Disposition:  HOME/SELF CARE  Per UR Regulation:    If discussed at Long Length of Stay Meetings, dates discussed:    Comments:  09/03/14 MD requested CM to have pt follow up at the wound clinic.  CM sent OUTPT referral request to Theodoro Kos, MD at wound Clinic and placed Wound Clinic information on pt's AVS to call this next week for a follow up appointment.  CM notified RN. No other CM needs were communicated.  Jarrett Ables, BSn, CM [phone].

## 2014-09-09 ENCOUNTER — Encounter: Payer: Self-pay | Admitting: Internal Medicine

## 2014-09-22 ENCOUNTER — Encounter (HOSPITAL_BASED_OUTPATIENT_CLINIC_OR_DEPARTMENT_OTHER): Payer: PPO | Attending: Internal Medicine

## 2014-09-22 DIAGNOSIS — I878 Other specified disorders of veins: Secondary | ICD-10-CM | POA: Diagnosis not present

## 2014-09-22 DIAGNOSIS — L97821 Non-pressure chronic ulcer of other part of left lower leg limited to breakdown of skin: Secondary | ICD-10-CM | POA: Insufficient documentation

## 2014-09-22 DIAGNOSIS — E11622 Type 2 diabetes mellitus with other skin ulcer: Secondary | ICD-10-CM | POA: Diagnosis present

## 2014-09-22 LAB — GLUCOSE, CAPILLARY: Glucose-Capillary: 134 mg/dL — ABNORMAL HIGH (ref 70–99)

## 2014-09-29 DIAGNOSIS — L97821 Non-pressure chronic ulcer of other part of left lower leg limited to breakdown of skin: Secondary | ICD-10-CM | POA: Diagnosis not present

## 2014-09-29 DIAGNOSIS — E11622 Type 2 diabetes mellitus with other skin ulcer: Secondary | ICD-10-CM | POA: Diagnosis not present

## 2014-09-29 DIAGNOSIS — I878 Other specified disorders of veins: Secondary | ICD-10-CM | POA: Diagnosis not present

## 2014-10-06 ENCOUNTER — Encounter (HOSPITAL_BASED_OUTPATIENT_CLINIC_OR_DEPARTMENT_OTHER): Payer: PPO | Attending: Internal Medicine

## 2014-10-06 DIAGNOSIS — Z923 Personal history of irradiation: Secondary | ICD-10-CM | POA: Insufficient documentation

## 2014-10-06 DIAGNOSIS — L97821 Non-pressure chronic ulcer of other part of left lower leg limited to breakdown of skin: Secondary | ICD-10-CM | POA: Insufficient documentation

## 2014-10-06 DIAGNOSIS — J449 Chronic obstructive pulmonary disease, unspecified: Secondary | ICD-10-CM | POA: Insufficient documentation

## 2014-10-06 DIAGNOSIS — E1151 Type 2 diabetes mellitus with diabetic peripheral angiopathy without gangrene: Secondary | ICD-10-CM | POA: Insufficient documentation

## 2014-10-06 DIAGNOSIS — E11622 Type 2 diabetes mellitus with other skin ulcer: Secondary | ICD-10-CM | POA: Insufficient documentation

## 2014-10-06 DIAGNOSIS — I739 Peripheral vascular disease, unspecified: Secondary | ICD-10-CM | POA: Insufficient documentation

## 2014-10-06 DIAGNOSIS — I87312 Chronic venous hypertension (idiopathic) with ulcer of left lower extremity: Secondary | ICD-10-CM | POA: Insufficient documentation

## 2014-10-06 DIAGNOSIS — Z9221 Personal history of antineoplastic chemotherapy: Secondary | ICD-10-CM | POA: Insufficient documentation

## 2014-10-07 ENCOUNTER — Other Ambulatory Visit: Payer: Self-pay | Admitting: Family Medicine

## 2014-10-07 NOTE — Telephone Encounter (Signed)
Refill request

## 2014-10-07 NOTE — Telephone Encounter (Signed)
Called and LMOM.  I did his RF, but he needs to come in and see [name] soon unless he is now seeing another PCP

## 2014-10-12 ENCOUNTER — Other Ambulatory Visit: Payer: Self-pay | Admitting: Cardiovascular Disease

## 2014-10-13 DIAGNOSIS — I87312 Chronic venous hypertension (idiopathic) with ulcer of left lower extremity: Secondary | ICD-10-CM | POA: Diagnosis not present

## 2014-10-13 DIAGNOSIS — L97821 Non-pressure chronic ulcer of other part of left lower leg limited to breakdown of skin: Secondary | ICD-10-CM | POA: Diagnosis not present

## 2014-10-13 DIAGNOSIS — I739 Peripheral vascular disease, unspecified: Secondary | ICD-10-CM | POA: Diagnosis not present

## 2014-10-13 DIAGNOSIS — E11622 Type 2 diabetes mellitus with other skin ulcer: Secondary | ICD-10-CM | POA: Diagnosis not present

## 2014-10-20 DIAGNOSIS — L97821 Non-pressure chronic ulcer of other part of left lower leg limited to breakdown of skin: Secondary | ICD-10-CM | POA: Diagnosis not present

## 2014-10-20 DIAGNOSIS — I739 Peripheral vascular disease, unspecified: Secondary | ICD-10-CM | POA: Diagnosis not present

## 2014-10-20 DIAGNOSIS — I87312 Chronic venous hypertension (idiopathic) with ulcer of left lower extremity: Secondary | ICD-10-CM | POA: Diagnosis not present

## 2014-10-20 DIAGNOSIS — E11622 Type 2 diabetes mellitus with other skin ulcer: Secondary | ICD-10-CM | POA: Diagnosis not present

## 2014-10-27 DIAGNOSIS — E11622 Type 2 diabetes mellitus with other skin ulcer: Secondary | ICD-10-CM | POA: Diagnosis not present

## 2014-11-03 ENCOUNTER — Encounter (HOSPITAL_BASED_OUTPATIENT_CLINIC_OR_DEPARTMENT_OTHER): Payer: PPO | Attending: Internal Medicine

## 2014-11-03 DIAGNOSIS — I509 Heart failure, unspecified: Secondary | ICD-10-CM | POA: Diagnosis not present

## 2014-11-03 DIAGNOSIS — R609 Edema, unspecified: Secondary | ICD-10-CM | POA: Diagnosis not present

## 2014-11-03 DIAGNOSIS — L97921 Non-pressure chronic ulcer of unspecified part of left lower leg limited to breakdown of skin: Secondary | ICD-10-CM | POA: Diagnosis not present

## 2014-11-03 DIAGNOSIS — E11622 Type 2 diabetes mellitus with other skin ulcer: Secondary | ICD-10-CM | POA: Insufficient documentation

## 2014-11-03 DIAGNOSIS — I83222 Varicose veins of left lower extremity with both ulcer of calf and inflammation: Secondary | ICD-10-CM | POA: Insufficient documentation

## 2014-11-10 DIAGNOSIS — E11622 Type 2 diabetes mellitus with other skin ulcer: Secondary | ICD-10-CM | POA: Diagnosis not present

## 2014-11-10 DIAGNOSIS — I83222 Varicose veins of left lower extremity with both ulcer of calf and inflammation: Secondary | ICD-10-CM | POA: Diagnosis not present

## 2014-11-10 DIAGNOSIS — L97921 Non-pressure chronic ulcer of unspecified part of left lower leg limited to breakdown of skin: Secondary | ICD-10-CM | POA: Diagnosis not present

## 2014-11-10 DIAGNOSIS — I509 Heart failure, unspecified: Secondary | ICD-10-CM | POA: Diagnosis not present

## 2014-11-11 ENCOUNTER — Telehealth: Payer: Self-pay | Admitting: Cardiovascular Disease

## 2014-11-11 NOTE — Telephone Encounter (Signed)
Left pt a message to call back. 

## 2014-11-11 NOTE — Telephone Encounter (Signed)
Both legs swollen for 3-4 weeks--fluid leaking same amount of time- has appt 12-01-14 and wants to know if can be seen sooner-pls call

## 2014-11-11 NOTE — Telephone Encounter (Signed)
Pt has an appointment to see Dr. Acie Fredrickson on 12/01/14. Pt  Would like to know if he  can have an early appointment for his swollen LE. Pt would like to have the appointment with Dr. Acie Fredrickson only. Pt is aware that Dr. Acie Fredrickson does not have any penning prior July. He would like to speak with MD's nurse for recommendations.

## 2014-11-14 NOTE — Telephone Encounter (Signed)
Left message for patient to call back regarding appointment

## 2014-11-16 NOTE — Telephone Encounter (Signed)
Left message for patient to call office if he still needs follow-up

## 2014-11-17 ENCOUNTER — Encounter: Payer: Self-pay | Admitting: Internal Medicine

## 2014-11-17 ENCOUNTER — Ambulatory Visit (INDEPENDENT_AMBULATORY_CARE_PROVIDER_SITE_OTHER): Payer: PPO | Admitting: *Deleted

## 2014-11-17 DIAGNOSIS — I509 Heart failure, unspecified: Secondary | ICD-10-CM | POA: Diagnosis not present

## 2014-11-17 DIAGNOSIS — E11622 Type 2 diabetes mellitus with other skin ulcer: Secondary | ICD-10-CM | POA: Diagnosis not present

## 2014-11-17 DIAGNOSIS — L97921 Non-pressure chronic ulcer of unspecified part of left lower leg limited to breakdown of skin: Secondary | ICD-10-CM | POA: Diagnosis not present

## 2014-11-17 DIAGNOSIS — I255 Ischemic cardiomyopathy: Secondary | ICD-10-CM

## 2014-11-17 DIAGNOSIS — I83222 Varicose veins of left lower extremity with both ulcer of calf and inflammation: Secondary | ICD-10-CM | POA: Diagnosis not present

## 2014-11-17 NOTE — Progress Notes (Signed)
Remote ICD transmission.   

## 2014-11-21 ENCOUNTER — Other Ambulatory Visit: Payer: Self-pay | Admitting: Family Medicine

## 2014-11-22 LAB — CUP PACEART REMOTE DEVICE CHECK
Date Time Interrogation Session: 20160524225533
HighPow Impedance: 56 Ohm
Lead Channel Impedance Value: 310 Ohm
Lead Channel Setting Sensing Sensitivity: 0.5 mV
MDC IDC MSMT LEADCHNL RV SENSING INTR AMPL: 12 mV
MDC IDC SET LEADCHNL RV PACING AMPLITUDE: 2.5 V
MDC IDC SET LEADCHNL RV PACING PULSEWIDTH: 0.5 ms
MDC IDC STAT BRADY RV PERCENT PACED: 1 % — AB
Pulse Gen Serial Number: 827666
Zone Setting Detection Interval: 250 ms
Zone Setting Detection Interval: 300 ms
Zone Setting Detection Interval: 350 ms

## 2014-11-22 NOTE — Telephone Encounter (Signed)
Called and LMOM- I have not seen him in a year and do not think that we have been regularly filling this medication.  Please call and give me an update about this medication- has he been using it all along or is he having some symptoms that made him request this RF?

## 2014-11-24 ENCOUNTER — Telehealth: Payer: Self-pay | Admitting: Nurse Practitioner

## 2014-11-24 DIAGNOSIS — I83222 Varicose veins of left lower extremity with both ulcer of calf and inflammation: Secondary | ICD-10-CM | POA: Diagnosis not present

## 2014-11-24 NOTE — Telephone Encounter (Signed)
Called and did reach him.  Refilled symbicort which he states he is using regularly.  However did ask him to come in for a follow-up soon

## 2014-11-24 NOTE — Telephone Encounter (Signed)
Spoke with patient who called to follow-up on appointment scheduled with Dr. Acie Fredrickson for June 2.  Patient is currently undergoing wound care on his legs and states he has an appointment with the wound care doctor on the same day.  I advised him that the next available opening for Dr. Acie Fredrickson is July.  Patient states he will attempt to reschedule appointment with Dr. Quentin Cornwall, wound care specialist and will call back if any additional assistance is needed.

## 2014-12-01 ENCOUNTER — Encounter: Payer: Self-pay | Admitting: Cardiovascular Disease

## 2014-12-01 ENCOUNTER — Ambulatory Visit (INDEPENDENT_AMBULATORY_CARE_PROVIDER_SITE_OTHER): Payer: PPO | Admitting: Cardiovascular Disease

## 2014-12-01 ENCOUNTER — Other Ambulatory Visit: Payer: Self-pay

## 2014-12-01 VITALS — BP 90/46 | HR 82 | Ht 70.0 in

## 2014-12-01 DIAGNOSIS — I255 Ischemic cardiomyopathy: Secondary | ICD-10-CM

## 2014-12-01 DIAGNOSIS — I2510 Atherosclerotic heart disease of native coronary artery without angina pectoris: Secondary | ICD-10-CM

## 2014-12-01 DIAGNOSIS — I5023 Acute on chronic systolic (congestive) heart failure: Secondary | ICD-10-CM | POA: Diagnosis not present

## 2014-12-01 DIAGNOSIS — N179 Acute kidney failure, unspecified: Secondary | ICD-10-CM

## 2014-12-01 MED ORDER — FUROSEMIDE 40 MG PO TABS
40.0000 mg | ORAL_TABLET | Freq: Two times a day (BID) | ORAL | Status: DC
Start: 2014-12-01 — End: 2014-12-21

## 2014-12-01 MED ORDER — ATORVASTATIN CALCIUM 40 MG PO TABS: ORAL_TABLET | ORAL | Status: DC

## 2014-12-01 MED ORDER — METOPROLOL SUCCINATE ER 25 MG PO TB24: 12.5000 mg | ORAL_TABLET | Freq: Every day | ORAL | Status: DC

## 2014-12-01 MED ORDER — CLOPIDOGREL BISULFATE 75 MG PO TABS: 75.0000 mg | ORAL_TABLET | Freq: Every day | ORAL | Status: DC

## 2014-12-01 MED ORDER — NITROGLYCERIN 0.4 MG SL SUBL: 0.4000 mg | SUBLINGUAL_TABLET | SUBLINGUAL | Status: AC | PRN

## 2014-12-01 NOTE — Progress Notes (Signed)
Cardiology Office Note   Date:  12/01/2014   ID:  James Moon, DOB March 13, 1946, MRN [id number]  PCP:  Lamar Blinks, MD  Cardiologist:   Thayer Headings, MD   Chief Complaint  Patient presents with  . Follow-up    chronic systolic and diastolic CHF   Problem List  1. CAD - CABG 1996, s/p DES 2012 2. CHF - EF 25-30% 3. AICD placement 4.  Pulmonary Hypertension -  5. Ongoing cigarette abuse 6. Chronic leg edema   History of Present Illness:  James Moon is a 69 year old gentleman with a history of coronary artery disease. He status post non-ST segment elevation myocardial infarction in December. He had 2 stents placed. His ejection fraction was noted to be severely depressed at that time. He's been on good medical therapy. He returned for an echocardiogram last week which revealed persistently depressed left systolic function with ejection fraction by my reading of 25-30%. He has anterior and apical akinesis.  He's not had any symptoms of chest pain or shortness breath. He is overall done quite well. 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 He's not had any worsening symptoms. He denies any syncope or presyncope. He denies any PND or orthopnea.  He has seen Dr. Caryl Comes. An AICD was placed on 12/11/11.   Dec. 18, 2013  James Moon is a 69 yo with the above noted hx. He has developed a runny nose and cough - I suspect it is a a viral illness. He still smokes. He he denies any chest pain or shortness of breath. He still works 4 days week as a Actor.  December 23, 2012:  He restarted smoking again. He is active at work and walks occasionally. He had some leg swelling and Dr. Caryl Comes increased his lasix to 40 QD with 80 mg 3 times a week. He has an AICD.  The leg edema has improved but he is having some episodes of orthostasis.   Nov 25, 2013: He is doing well. Has stopped smoking . No CP or dypnea. His chronic leg edema. He  has compression hose to wear when he works. We have filled out his FMLA form. He works at [employer] and is on his feet quite a bit   August 30, 2014:    James Moon is a 69 y.o. male who presents for follow-up on his chronic combined systolic and diastolic congestive heart failure. He has significant leg edema. He's developed an ulceration and now has weeping from his leg.  He has known pulmonary Hypertension with PA pressures around 60 by echo March 2015   He is not having any dyspnea.  No CP.   He has had severe leg edema for years and this has worsened over the past several months . He has had weeping edema for the past several months.  He works as a Tourist information centre manager at [employer] and is on his feet quite a bit.   December 01, 2014:   Still smoking. Still has significant leg edema   Past Medical History  Diagnosis Date  . Diabetes mellitus   . Colon cancer     s/p colectomy   . Hypertension   . CAD (coronary artery disease)     a. CABG in 1996. b. NSTEMI 06/2011: DES x 2 to SVG to 1st and 2nd OM and SVG to PDA. c. 09/2013: NSTEMI s/p DES to prox SVG-OM1, NCPTCA of mSVG-OM1.  . Ischemic cardiomyopathy  Dec. 2012    a. EF 20 to 25% 2012. b. 30-35% echo 3/13. c. EF 40-45% by echo 09/2013 but then at time of NSTEMI was 20% by cath later that month.  . Tobacco abuse   . COPD (chronic obstructive pulmonary disease)   . Chronic systolic CHF (congestive heart failure)   . Chronic respiratory failure     a. On home O2 - due to COPD.  Marland Kitchen NSVT (nonsustained ventricular tachycardia)   . Hypotension   . AICD ([address] [place])   . Thrombocytopenia   . Pulmonary HTN   . Syncope     a. 03/2013 in setting of hypotension.  . Venous stasis ulcers of both lower extremities 08/2014    Past Surgical History  Procedure Laterality Date  . Colon surgery    . Open heart surgery  01/1995  . Coronary stent placement  Dec. 2012    DES to SVG to 1st and 2nd OM and SVG to the PDA  . Coronary artery bypass graft  01/1995    . Coronary angioplasty    . Colon surgery      FOR HISTORY OF COLON CANCER  . Left heart catheterization with coronary angiogram N/A 06/10/2011    Procedure: LEFT HEART CATHETERIZATION WITH CORONARY ANGIOGRAM;  Surgeon: Minus Breeding, MD;  Location: [facility] CATH LAB;  Service: Cardiovascular;  Laterality: N/A;  . Graft(s) angiogram  06/10/2011    Procedure: GRAFT(S) Cyril Loosen;  Surgeon: Minus Breeding, MD;  Location: [facility] CATH LAB;  Service: Cardiovascular;;  . Percutaneous coronary stent intervention (pci-s) N/A 06/11/2011    Procedure: PERCUTANEOUS CORONARY STENT INTERVENTION (PCI-S);  Surgeon: Peter M Martinique, MD;  Location: [facility] CATH LAB;  Service: Cardiovascular;  Laterality: N/A;  . Implantable cardioverter defibrillator implant N/A 12/11/2011    Procedure: IMPLANTABLE CARDIOVERTER DEFIBRILLATOR IMPLANT;  Surgeon: Deboraha Sprang, MD;  Location: [facility] CATH LAB;  Service: Cardiovascular;  Laterality: N/A;  . Left and right heart catheterization with coronary/graft angiogram N/A 10/20/2013    Procedure: LEFT AND RIGHT HEART CATHETERIZATION WITH Beatrix Fetters;  Surgeon: Troy Sine, MD;  Location: [facility] CATH LAB;  Service: Cardiovascular;  Laterality: N/A;     Current Outpatient Prescriptions  Medication Sig Dispense Refill  . aspirin EC 81 MG tablet Take 81 mg by mouth daily.     Marland Kitchen atorvastatin (LIPITOR) 40 MG tablet TAKE ONE TABLET BY MOUTH ONCE DAILY. 90 tablet 3  . clopidogrel (PLAVIX) 75 MG tablet Take 1 tablet (75 mg total) by mouth daily. 90 tablet 3  . collagenase (SANTYL) ointment Apply topically daily. 15 g 0  . doxycycline (VIBRA-TABS) 100 MG tablet Take 1 tablet (100 mg total) by mouth every 12 (twelve) hours. 10 tablet 0  . furosemide (LASIX) 40 MG tablet Take 1 tablet (40 mg total) by mouth 2 (two) times daily. 180 tablet 3  . insulin aspart (NOVOLOG) 100 UNIT/ML injection CBG 70 - 120: 0 units CBG 121 - 150: 2 units CBG 151 - 200: 3 units CBG 201 - 250: 5 units CBG 251 -  300: 8 units CBG 301 - 350: 11 units CBG 351 - 400: 15 units 10 mL 2  . Insulin Glargine (LANTUS SOLOSTAR) 100 UNIT/ML Solostar Pen Inject 20 Units into the skin daily at 10 pm. (Patient taking differently: Inject 20-24 Units into the skin every morning. ) 15 mL 11  . insulin NPH-regular Human (NOVOLIN 70/30) (70-30) 100 UNIT/ML injection Inject 3 Units into the skin every 8 (eight) hours.    Marland Kitchen  ipratropium-albuterol (DUONEB) 0.5-2.5 (3) MG/3ML SOLN Take 3 mLs by nebulization every 6 (six) hours as needed. 360 mL 1  . metoprolol succinate (TOPROL-XL) 25 MG 24 hr tablet Take 0.5 tablets (12.5 mg total) by mouth daily. 30 tablet 9  . NEEDLE, DISP, 22 G (B-D DISP NEEDLE 22GX3/4") 22G X 3/4" MISC 1 Syringe by Does not apply route daily. 1 each 0  . nitroGLYCERIN (NITROSTAT) 0.4 MG SL tablet Place 1 tablet (0.4 mg total) under the tongue every 5 (five) minutes as needed for chest pain. For chest pain 25 tablet 3  . RELION INSULIN SYR 0.3ML/31G 31G X 5/16" 0.3 ML MISC USE THREE TIMES DAILY 100 each 0  . SYMBICORT 160-4.5 MCG/ACT inhaler INHALE TWO PUFFS BY MOUTH TWICE DAILY 19.2 g 2   No current facility-administered medications for this visit.    Allergies:   Review of patient's allergies indicates no known allergies.    Social History:  The patient  reports that he has been smoking Cigarettes.  He started smoking about 2 years ago. He has a 25 pack-year smoking history. He has never used smokeless tobacco. He reports that he does not drink alcohol or use illicit drugs.   Family History:  The patient's family history includes Breast cancer in his mother; Diabetes in his father; Heart attack in his father.    ROS:  Please see the history of present illness.    Review of Systems: Constitutional:  denies fever, chills, diaphoresis, appetite change and fatigue.  HEENT: denies photophobia, eye pain, redness, hearing loss, ear pain, congestion, sore throat, rhinorrhea, sneezing, neck pain, neck  stiffness and tinnitus.  Respiratory: denies SOB, DOE, cough, chest tightness, and wheezing.  Cardiovascular: denies chest pain, palpitations.  He has significant leg swelling an weeping from multiple areas from his legs.   Gastrointestinal: denies nausea, vomiting, abdominal pain, diarrhea, constipation, blood in stool.  Genitourinary: denies dysuria, urgency, frequency, hematuria, flank pain and difficulty urinating.  Musculoskeletal: denies  myalgias, back pain, joint swelling, arthralgias and gait problem.   Skin: denies pallor, rash and wound.  Neurological: denies dizziness, seizures, syncope, weakness, light-headedness, numbness and headaches.   Hematological: denies adenopathy, easy bruising, personal or family bleeding history.  Psychiatric/ Behavioral: denies suicidal ideation, mood changes, confusion, nervousness, sleep disturbance and agitation.       All other systems are reviewed and negative.    PHYSICAL EXAM: VS:  BP 90/46 mmHg  Pulse 82  Ht 5\' 10"  (1.778 m)  Wt   SpO2 80% , BMI There is no weight on file to calculate BMI. GEN: chroniclly ill appearing gentleman HEENT: normal Neck: no JVD, carotid bruits, or masses Cardiac: RRR; no murmurs, rubs, or gallops,  Legs are tightly wrapped with ace wraps   Respiratory:  Decreased breath sound bilaterally  GI: soft, nontender, nondistended, + BS MS:   ,  Legs are tightly wrapped with ace wraps.  Skin: warm and dry, no rash Neuro:  Strength and sensation are intact, hard of hearing Psych: normal   EKG:  EKG is ordered today. The ekg ordered today demonstrates NSR at 87.     Recent Labs: 08/30/2014: B Natriuretic Peptide 547.1* 09/01/2014: ALT 15 09/03/2014: BUN 30*; Creatinine 1.31; Hemoglobin 11.6*; Magnesium 1.9; Platelets 117*; Potassium 4.6; Sodium 136    Lipid Panel    Component Value Date/Time   CHOL 71 04/22/2013 0455   TRIG 54 04/22/2013 0455   HDL 36* 04/22/2013 0455   CHOLHDL 2.0 04/22/2013 0455  VLDL  11 04/22/2013 0455   LDLCALC 24 04/22/2013 0455      Wt Readings from Last 3 Encounters:  09/02/14 70 kg (154 lb 5.2 oz)  08/30/14 65.772 kg (145 lb)  12/30/13 64.864 kg (143 lb)      Other studies Reviewed: Additional studies/ records that were reviewed today include: . Review of the above records demonstrates:    ASSESSMENT AND PLAN:  1. CAD - CABG 1996, s/p DES 2012 - he's not having any CP and denies any increased dyspnea.  He has COPD  2. CHF - EF 25-30% - He's hypotensive . I suspect that his problems are more related to right-sided heart failure/pulmonary hypertension which is due to his severe COPD.  3. AICD placement - followed by Dr. Caryl Comes  4.  Pulmonary Hypertension -  I've encouraged him to stop smoking.  I suspect that his leg edema is primarily due to his severe COPD and subsequent pulmonary hypertension. 5. Ongoing cigarette abuse 6. Chronic leg edema - due to pylmonary hypertension 7. COPD -  8. 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  He is seeing a doctor in the wound clinic .   Current medicines are reviewed at length with the patient today.  The patient does not have concerns regarding medicines.  The following changes have been made:    Disposition:   FU with me in 6 months    Signed, Sheronda Parran, Wonda Cheng, MD  12/01/2014 3:50 PM    [address], [place], [place]  [address] Phone: [phone]; Fax: [phone]

## 2014-12-01 NOTE — Patient Instructions (Signed)
Medication Instructions:  Your physician recommends that you continue on your current medications as directed. Please refer to the Current Medication list given to you today.   Labwork: None Ordered   Testing/Procedures: None Ordered   Follow-Up: Your physician wants you to follow-up in: 6 months with Dr. Nahser.  You will receive a reminder letter in the mail two months in advance. If you don't receive a letter, please call our office to schedule the follow-up appointment.     

## 2014-12-02 ENCOUNTER — Encounter (HOSPITAL_BASED_OUTPATIENT_CLINIC_OR_DEPARTMENT_OTHER): Payer: PPO | Attending: Internal Medicine

## 2014-12-02 DIAGNOSIS — I10 Essential (primary) hypertension: Secondary | ICD-10-CM | POA: Insufficient documentation

## 2014-12-02 DIAGNOSIS — L97211 Non-pressure chronic ulcer of right calf limited to breakdown of skin: Secondary | ICD-10-CM | POA: Insufficient documentation

## 2014-12-02 DIAGNOSIS — I252 Old myocardial infarction: Secondary | ICD-10-CM | POA: Diagnosis not present

## 2014-12-02 DIAGNOSIS — I2510 Atherosclerotic heart disease of native coronary artery without angina pectoris: Secondary | ICD-10-CM | POA: Insufficient documentation

## 2014-12-02 DIAGNOSIS — I83222 Varicose veins of left lower extremity with both ulcer of calf and inflammation: Secondary | ICD-10-CM | POA: Insufficient documentation

## 2014-12-02 DIAGNOSIS — L89612 Pressure ulcer of right heel, stage 2: Secondary | ICD-10-CM | POA: Insufficient documentation

## 2014-12-02 DIAGNOSIS — J449 Chronic obstructive pulmonary disease, unspecified: Secondary | ICD-10-CM | POA: Insufficient documentation

## 2014-12-02 DIAGNOSIS — E1151 Type 2 diabetes mellitus with diabetic peripheral angiopathy without gangrene: Secondary | ICD-10-CM | POA: Insufficient documentation

## 2014-12-02 DIAGNOSIS — L97221 Non-pressure chronic ulcer of left calf limited to breakdown of skin: Secondary | ICD-10-CM | POA: Insufficient documentation

## 2014-12-07 ENCOUNTER — Encounter: Payer: Self-pay | Admitting: Cardiology

## 2014-12-08 DIAGNOSIS — I83222 Varicose veins of left lower extremity with both ulcer of calf and inflammation: Secondary | ICD-10-CM | POA: Diagnosis not present

## 2014-12-09 ENCOUNTER — Other Ambulatory Visit: Payer: Self-pay | Admitting: Family Medicine

## 2014-12-14 ENCOUNTER — Inpatient Hospital Stay (HOSPITAL_COMMUNITY)
Admission: EM | Admit: 2014-12-14 | Discharge: 2014-12-22 | DRG: 291 | Disposition: A | Discharge status: Skilled Nursing Facility | Payer: PPO | Attending: Family Medicine | Admitting: Family Medicine

## 2014-12-14 ENCOUNTER — Other Ambulatory Visit (HOSPITAL_COMMUNITY): Payer: Self-pay

## 2014-12-14 ENCOUNTER — Encounter (HOSPITAL_COMMUNITY): Payer: Self-pay

## 2014-12-14 ENCOUNTER — Emergency Department (HOSPITAL_COMMUNITY): Payer: PPO

## 2014-12-14 DIAGNOSIS — F17210 Nicotine dependence, cigarettes, uncomplicated: Secondary | ICD-10-CM | POA: Diagnosis present

## 2014-12-14 DIAGNOSIS — E875 Hyperkalemia: Secondary | ICD-10-CM | POA: Diagnosis present

## 2014-12-14 DIAGNOSIS — Z7951 Long term (current) use of inhaled steroids: Secondary | ICD-10-CM | POA: Diagnosis not present

## 2014-12-14 DIAGNOSIS — I10 Essential (primary) hypertension: Secondary | ICD-10-CM | POA: Diagnosis present

## 2014-12-14 DIAGNOSIS — J9600 Acute respiratory failure, unspecified whether with hypoxia or hypercapnia: Secondary | ICD-10-CM | POA: Insufficient documentation

## 2014-12-14 DIAGNOSIS — R0602 Shortness of breath: Secondary | ICD-10-CM | POA: Diagnosis present

## 2014-12-14 DIAGNOSIS — J441 Chronic obstructive pulmonary disease with (acute) exacerbation: Secondary | ICD-10-CM | POA: Insufficient documentation

## 2014-12-14 DIAGNOSIS — Z95810 Presence of automatic (implantable) cardiac defibrillator: Secondary | ICD-10-CM

## 2014-12-14 DIAGNOSIS — Z7902 Long term (current) use of antithrombotics/antiplatelets: Secondary | ICD-10-CM

## 2014-12-14 DIAGNOSIS — Z79899 Other long term (current) drug therapy: Secondary | ICD-10-CM

## 2014-12-14 DIAGNOSIS — I255 Ischemic cardiomyopathy: Secondary | ICD-10-CM | POA: Diagnosis present

## 2014-12-14 DIAGNOSIS — E441 Mild protein-calorie malnutrition: Secondary | ICD-10-CM | POA: Diagnosis present

## 2014-12-14 DIAGNOSIS — R0603 Acute respiratory distress: Secondary | ICD-10-CM | POA: Diagnosis present

## 2014-12-14 DIAGNOSIS — I509 Heart failure, unspecified: Secondary | ICD-10-CM | POA: Diagnosis not present

## 2014-12-14 DIAGNOSIS — I272 Other secondary pulmonary hypertension: Secondary | ICD-10-CM | POA: Diagnosis present

## 2014-12-14 DIAGNOSIS — W19XXXA Unspecified fall, initial encounter: Secondary | ICD-10-CM

## 2014-12-14 DIAGNOSIS — I5043 Acute on chronic combined systolic (congestive) and diastolic (congestive) heart failure: Secondary | ICD-10-CM | POA: Diagnosis present

## 2014-12-14 DIAGNOSIS — Z85038 Personal history of other malignant neoplasm of large intestine: Secondary | ICD-10-CM

## 2014-12-14 DIAGNOSIS — J9622 Acute and chronic respiratory failure with hypercapnia: Secondary | ICD-10-CM | POA: Diagnosis present

## 2014-12-14 DIAGNOSIS — R4182 Altered mental status, unspecified: Secondary | ICD-10-CM | POA: Insufficient documentation

## 2014-12-14 DIAGNOSIS — E872 Acidosis: Secondary | ICD-10-CM | POA: Diagnosis present

## 2014-12-14 DIAGNOSIS — R5381 Other malaise: Secondary | ICD-10-CM | POA: Diagnosis present

## 2014-12-14 DIAGNOSIS — I83009 Varicose veins of unspecified lower extremity with ulcer of unspecified site: Secondary | ICD-10-CM | POA: Diagnosis present

## 2014-12-14 DIAGNOSIS — Z951 Presence of aortocoronary bypass graft: Secondary | ICD-10-CM | POA: Diagnosis not present

## 2014-12-14 DIAGNOSIS — J9620 Acute and chronic respiratory failure, unspecified whether with hypoxia or hypercapnia: Secondary | ICD-10-CM | POA: Diagnosis present

## 2014-12-14 DIAGNOSIS — Z7982 Long term (current) use of aspirin: Secondary | ICD-10-CM | POA: Diagnosis not present

## 2014-12-14 DIAGNOSIS — J9602 Acute respiratory failure with hypercapnia: Secondary | ICD-10-CM | POA: Diagnosis not present

## 2014-12-14 DIAGNOSIS — D649 Anemia, unspecified: Secondary | ICD-10-CM | POA: Diagnosis present

## 2014-12-14 DIAGNOSIS — E11649 Type 2 diabetes mellitus with hypoglycemia without coma: Secondary | ICD-10-CM | POA: Diagnosis present

## 2014-12-14 DIAGNOSIS — I501 Left ventricular failure: Secondary | ICD-10-CM | POA: Diagnosis not present

## 2014-12-14 DIAGNOSIS — L97929 Non-pressure chronic ulcer of unspecified part of left lower leg with unspecified severity: Secondary | ICD-10-CM | POA: Diagnosis present

## 2014-12-14 DIAGNOSIS — R778 Other specified abnormalities of plasma proteins: Secondary | ICD-10-CM | POA: Insufficient documentation

## 2014-12-14 DIAGNOSIS — E860 Dehydration: Secondary | ICD-10-CM | POA: Diagnosis present

## 2014-12-14 DIAGNOSIS — Z794 Long term (current) use of insulin: Secondary | ICD-10-CM | POA: Diagnosis not present

## 2014-12-14 DIAGNOSIS — R400 Somnolence: Secondary | ICD-10-CM | POA: Diagnosis not present

## 2014-12-14 DIAGNOSIS — R0600 Dyspnea, unspecified: Secondary | ICD-10-CM | POA: Diagnosis not present

## 2014-12-14 DIAGNOSIS — Z9981 Dependence on supplemental oxygen: Secondary | ICD-10-CM | POA: Diagnosis not present

## 2014-12-14 DIAGNOSIS — I2510 Atherosclerotic heart disease of native coronary artery without angina pectoris: Secondary | ICD-10-CM | POA: Diagnosis present

## 2014-12-14 DIAGNOSIS — L8990 Pressure ulcer of unspecified site, unspecified stage: Secondary | ICD-10-CM | POA: Insufficient documentation

## 2014-12-14 DIAGNOSIS — G9341 Metabolic encephalopathy: Secondary | ICD-10-CM | POA: Diagnosis present

## 2014-12-14 DIAGNOSIS — N179 Acute kidney failure, unspecified: Secondary | ICD-10-CM | POA: Diagnosis present

## 2014-12-14 DIAGNOSIS — R7989 Other specified abnormal findings of blood chemistry: Secondary | ICD-10-CM

## 2014-12-14 DIAGNOSIS — Z515 Encounter for palliative care: Secondary | ICD-10-CM | POA: Diagnosis not present

## 2014-12-14 DIAGNOSIS — Z955 Presence of coronary angioplasty implant and graft: Secondary | ICD-10-CM | POA: Diagnosis not present

## 2014-12-14 DIAGNOSIS — I252 Old myocardial infarction: Secondary | ICD-10-CM

## 2014-12-14 DIAGNOSIS — D696 Thrombocytopenia, unspecified: Secondary | ICD-10-CM | POA: Diagnosis present

## 2014-12-14 DIAGNOSIS — Z6822 Body mass index (BMI) 22.0-22.9, adult: Secondary | ICD-10-CM

## 2014-12-14 DIAGNOSIS — J9601 Acute respiratory failure with hypoxia: Secondary | ICD-10-CM | POA: Diagnosis not present

## 2014-12-14 LAB — BLOOD GAS, ARTERIAL
Acid-Base Excess: 5.6 mmol/L — ABNORMAL HIGH (ref 0.0–2.0)
Acid-Base Excess: 7 mmol/L — ABNORMAL HIGH (ref 0.0–2.0)
Bicarbonate: 34 mEq/L — ABNORMAL HIGH (ref 20.0–24.0)
Bicarbonate: 34.1 mEq/L — ABNORMAL HIGH (ref 20.0–24.0)
DRAWN BY: 418751
Delivery systems: POSITIVE
Delivery systems: POSITIVE
Drawn by: 39898
EXPIRATORY PAP: 8
Expiratory PAP: 6
FIO2: 1 %
FIO2: 0.4 %
INSPIRATORY PAP: 12
Inspiratory PAP: 16
O2 Saturation: 99.5 %
O2 Saturation: 97 %
PCO2 ART: 100 mmHg — AB (ref 35.0–45.0)
PCO2 ART: 82.1 mmHg — AB (ref 35.0–45.0)
PH ART: 7.155 — AB (ref 7.350–7.450)
PH ART: 7.242 — AB (ref 7.350–7.450)
PO2 ART: 88.9 mmHg (ref 80.0–100.0)
Patient temperature: 98.6
Patient temperature: 98.6
RATE: 15 resp/min
RATE: 15 resp/min
TCO2: 37.1 mmol/L (ref 0–100)
TCO2: 36.7 mmol/L (ref 0–100)
pO2, Arterial: 319 mmHg — ABNORMAL HIGH (ref 80.0–100.0)

## 2014-12-14 LAB — I-STAT CHEM 8, ED
BUN: 45 mg/dL — ABNORMAL HIGH (ref 6–20)
Calcium, Ion: 1.13 mmol/L (ref 1.13–1.30)
Chloride: 95 mmol/L — ABNORMAL LOW (ref 101–111)
Creatinine, Ser: 1.5 mg/dL — ABNORMAL HIGH (ref 0.61–1.24)
GLUCOSE: 199 mg/dL — AB (ref 65–99)
HCT: 48 % (ref 39.0–52.0)
Hemoglobin: 16.3 g/dL (ref 13.0–17.0)
POTASSIUM: 5.2 mmol/L — AB (ref 3.5–5.1)
Sodium: 138 mmol/L (ref 135–145)
TCO2: 36 mmol/L (ref 0–100)

## 2014-12-14 LAB — I-STAT CG4 LACTIC ACID, ED: Lactic Acid, Venous: 2.33 mmol/L (ref 0.5–2.0)

## 2014-12-14 LAB — HEPATIC FUNCTION PANEL
ALK PHOS: 56 U/L (ref 38–126)
ALT: 10 U/L — ABNORMAL LOW (ref 17–63)
AST: 19 U/L (ref 15–41)
Albumin: 3.3 g/dL — ABNORMAL LOW (ref 3.5–5.0)
Bilirubin, Direct: 0.3 mg/dL (ref 0.1–0.5)
Indirect Bilirubin: 0.9 mg/dL (ref 0.3–0.9)
Total Bilirubin: 1.2 mg/dL (ref 0.3–1.2)
Total Protein: 6.9 g/dL (ref 6.5–8.1)

## 2014-12-14 LAB — I-STAT TROPONIN, ED: Troponin i, poc: 0.06 ng/mL (ref 0.00–0.08)

## 2014-12-14 MED ORDER — DOXYCYCLINE HYCLATE 100 MG PO TABS
100.0000 mg | ORAL_TABLET | Freq: Two times a day (BID) | ORAL | Status: DC
  Filled 2014-12-14: qty 1

## 2014-12-14 MED ORDER — NITROGLYCERIN 0.4 MG SL SUBL: 0.4000 mg | SUBLINGUAL_TABLET | SUBLINGUAL | Status: DC | PRN

## 2014-12-14 MED ORDER — IPRATROPIUM-ALBUTEROL 0.5-2.5 (3) MG/3ML IN SOLN
3.0000 mL | RESPIRATORY_TRACT | Status: DC
  Administered 2014-12-14 – 2014-12-17 (×15): 3 mL via RESPIRATORY_TRACT
  Filled 2014-12-14 (×14): qty 3

## 2014-12-14 MED ORDER — SODIUM CHLORIDE 0.9 % IV SOLN
Freq: Once | INTRAVENOUS | Status: AC
  Administered 2014-12-14: 20:00:00 via INTRAVENOUS

## 2014-12-14 MED ORDER — ASPIRIN EC 81 MG PO TBEC
81.0000 mg | DELAYED_RELEASE_TABLET | Freq: Every day | ORAL | Status: DC
  Administered 2014-12-16 – 2014-12-22 (×7): 81 mg via ORAL
  Filled 2014-12-14 (×8): qty 1

## 2014-12-14 MED ORDER — SODIUM CHLORIDE 0.9 % IJ SOLN
3.0000 mL | Freq: Two times a day (BID) | INTRAMUSCULAR | Status: DC
  Administered 2014-12-14 – 2014-12-19 (×10): 3 mL via INTRAVENOUS
  Administered 2014-12-19: 10 mL via INTRAVENOUS
  Administered 2014-12-20 – 2014-12-22 (×5): 3 mL via INTRAVENOUS

## 2014-12-14 MED ORDER — CLOPIDOGREL BISULFATE 75 MG PO TABS
75.0000 mg | ORAL_TABLET | Freq: Every day | ORAL | Status: DC
Start: 2014-12-15 — End: 2014-12-22
  Administered 2014-12-16 – 2014-12-22 (×7): 75 mg via ORAL
  Filled 2014-12-14 (×8): qty 1

## 2014-12-14 MED ORDER — ATORVASTATIN CALCIUM 40 MG PO TABS
40.0000 mg | ORAL_TABLET | Freq: Every day | ORAL | Status: DC
  Filled 2014-12-14: qty 1

## 2014-12-14 MED ORDER — FUROSEMIDE 10 MG/ML IJ SOLN
20.0000 mg | Freq: Once | INTRAMUSCULAR | Status: AC
  Administered 2014-12-14: 20 mg via INTRAVENOUS
  Filled 2014-12-14: qty 2

## 2014-12-14 MED ORDER — CHLORHEXIDINE GLUCONATE 0.12 % MT SOLN
15.0000 mL | Freq: Two times a day (BID) | OROMUCOSAL | Status: DC
  Administered 2014-12-15 – 2014-12-16 (×3): 15 mL via OROMUCOSAL
  Filled 2014-12-14 (×5): qty 15

## 2014-12-14 MED ORDER — ACETAMINOPHEN 325 MG PO TABS: 650.0000 mg | ORAL_TABLET | Freq: Four times a day (QID) | ORAL | Status: DC | PRN

## 2014-12-14 MED ORDER — ACETAMINOPHEN 650 MG RE SUPP: 650.0000 mg | Freq: Four times a day (QID) | RECTAL | Status: DC | PRN

## 2014-12-14 MED ORDER — SODIUM CHLORIDE 0.9 % IV SOLN
250.0000 mL | INTRAVENOUS | Status: DC | PRN
  Administered 2014-12-14: 250 mL via INTRAVENOUS

## 2014-12-14 MED ORDER — METHYLPREDNISOLONE SODIUM SUCC 125 MG IJ SOLR
125.0000 mg | Freq: Once | INTRAMUSCULAR | Status: AC
Start: 2014-12-14 — End: 2014-12-14
  Administered 2014-12-14: 125 mg via INTRAVENOUS
  Filled 2014-12-14: qty 2

## 2014-12-14 MED ORDER — PREDNISONE 50 MG PO TABS
50.0000 mg | ORAL_TABLET | Freq: Every day | ORAL | Status: DC
  Administered 2014-12-16 – 2014-12-17 (×2): 50 mg via ORAL
  Filled 2014-12-14 (×5): qty 1

## 2014-12-14 MED ORDER — SODIUM CHLORIDE 0.9 % IJ SOLN
3.0000 mL | Freq: Two times a day (BID) | INTRAMUSCULAR | Status: DC
  Administered 2014-12-14 – 2014-12-15 (×2): 3 mL via INTRAVENOUS

## 2014-12-14 MED ORDER — METOPROLOL SUCCINATE 12.5 MG HALF TABLET
12.5000 mg | ORAL_TABLET | Freq: Every day | ORAL | Status: DC
  Filled 2014-12-14: qty 1

## 2014-12-14 MED ORDER — BUDESONIDE-FORMOTEROL FUMARATE 160-4.5 MCG/ACT IN AERO
2.0000 | INHALATION_SPRAY | Freq: Two times a day (BID) | RESPIRATORY_TRACT | Status: DC
Start: 2014-12-15 — End: 2014-12-15
  Administered 2014-12-15: 2 via RESPIRATORY_TRACT
  Filled 2014-12-14: qty 6

## 2014-12-14 MED ORDER — NICOTINE 21 MG/24HR TD PT24
21.0000 mg | MEDICATED_PATCH | Freq: Once | TRANSDERMAL | Status: AC
  Administered 2014-12-15: 21 mg via TRANSDERMAL
  Filled 2014-12-14: qty 1

## 2014-12-14 MED ORDER — HEPARIN SODIUM (PORCINE) 5000 UNIT/ML IJ SOLN
5000.0000 [IU] | Freq: Three times a day (TID) | INTRAMUSCULAR | Status: DC
  Administered 2014-12-15: 5000 [IU] via SUBCUTANEOUS
  Filled 2014-12-14 (×6): qty 1

## 2014-12-14 MED ORDER — CETYLPYRIDINIUM CHLORIDE 0.05 % MT LIQD
7.0000 mL | Freq: Two times a day (BID) | OROMUCOSAL | Status: DC
Start: 2014-12-15 — End: 2014-12-16
  Administered 2014-12-16: 7 mL via OROMUCOSAL

## 2014-12-14 MED ORDER — DOCUSATE SODIUM 100 MG PO CAPS
100.0000 mg | ORAL_CAPSULE | Freq: Two times a day (BID) | ORAL | Status: DC
  Administered 2014-12-16 – 2014-12-22 (×12): 100 mg via ORAL
  Filled 2014-12-14 (×16): qty 1

## 2014-12-14 MED ORDER — INSULIN GLARGINE 100 UNIT/ML ~~LOC~~ SOLN
10.0000 [IU] | Freq: Every day | SUBCUTANEOUS | Status: DC
  Administered 2014-12-15 – 2014-12-16 (×2): 10 [IU] via SUBCUTANEOUS
  Filled 2014-12-14 (×4): qty 0.1

## 2014-12-14 MED ORDER — IPRATROPIUM-ALBUTEROL 0.5-2.5 (3) MG/3ML IN SOLN
3.0000 mL | RESPIRATORY_TRACT | Status: DC | PRN
  Filled 2014-12-14: qty 3

## 2014-12-14 MED ORDER — INSULIN ASPART 100 UNIT/ML ~~LOC~~ SOLN: 0.0000 [IU] | Freq: Three times a day (TID) | SUBCUTANEOUS | Status: DC

## 2014-12-14 MED ORDER — NICOTINE 21 MG/24HR TD PT24
21.0000 mg | MEDICATED_PATCH | Freq: Every day | TRANSDERMAL | Status: DC
  Administered 2014-12-15 – 2014-12-22 (×8): 21 mg via TRANSDERMAL
  Filled 2014-12-14 (×8): qty 1

## 2014-12-14 MED ORDER — SODIUM CHLORIDE 0.9 % IJ SOLN: 3.0000 mL | INTRAMUSCULAR | Status: DC | PRN

## 2014-12-14 NOTE — Progress Notes (Signed)
Notified Dr.Pedro of critical value on arterial blood gas.

## 2014-12-14 NOTE — ED Provider Notes (Addendum)
CSN: [id number]     Arrival date & time 12/14/14  1933 History   First MD Initiated Contact with Patient 12/14/14 1946     Chief Complaint  Patient presents with  . Shortness of Breath     (Consider location/radiation/quality/duration/timing/severity/associated sxs/prior Treatment) Patient is a 69 y.o. male presenting with altered mental status.  Altered Mental Status Presenting symptoms: confusion   Severity:  Moderate Most recent episode:  Today Duration: Unknown. Progression:  Resolved Chronicity:  New Context comment:  Patient was found at home today by the daughter sitting on his recliner with oxygen nasal cannula offset to one side of the face. Associated symptoms: difficulty breathing   Associated symptoms: no abdominal pain, no agitation, no depression, no fever, no headaches, no light-headedness, no nausea, no palpitations, no rash, no slurred speech, no vomiting and no weakness     Past Medical History  Diagnosis Date  . Diabetes mellitus   . Colon cancer     s/p colectomy   . Hypertension   . CAD (coronary artery disease)     a. CABG in 1996. b. NSTEMI 06/2011: DES x 2 to SVG to 1st and 2nd OM and SVG to PDA. c. 09/2013: NSTEMI s/p DES to prox SVG-OM1, NCPTCA of mSVG-OM1.  . Ischemic cardiomyopathy Dec. 2012    a. EF 20 to 25% 2012. b. 30-35% echo 3/13. c. EF 40-45% by echo 09/2013 but then at time of NSTEMI was 20% by cath later that month.  . Tobacco abuse   . COPD (chronic obstructive pulmonary disease)   . Chronic systolic CHF (congestive heart failure)   . Chronic respiratory failure     a. On home O2 - due to COPD.  Marland Kitchen NSVT (nonsustained ventricular tachycardia)   . Hypotension   . AICD ([address] [place])   . Thrombocytopenia   . Pulmonary HTN   . Syncope     a. 03/2013 in setting of hypotension.  . Venous stasis ulcers of both lower extremities 08/2014   Past Surgical History  Procedure Laterality Date  . Colon surgery    . Open heart surgery  01/1995  .  Coronary stent placement  Dec. 2012    DES to SVG to 1st and 2nd OM and SVG to the PDA  . Coronary artery bypass graft  01/1995  . Coronary angioplasty    . Colon surgery      FOR HISTORY OF COLON CANCER  . Left heart catheterization with coronary angiogram N/A 06/10/2011    Procedure: LEFT HEART CATHETERIZATION WITH CORONARY ANGIOGRAM;  Surgeon: Minus Breeding, MD;  Location: [facility] CATH LAB;  Service: Cardiovascular;  Laterality: N/A;  . Graft(s) angiogram  06/10/2011    Procedure: GRAFT(S) Cyril Loosen;  Surgeon: Minus Breeding, MD;  Location: [facility] CATH LAB;  Service: Cardiovascular;;  . Percutaneous coronary stent intervention (pci-s) N/A 06/11/2011    Procedure: PERCUTANEOUS CORONARY STENT INTERVENTION (PCI-S);  Surgeon: Peter M Martinique, MD;  Location: [facility] CATH LAB;  Service: Cardiovascular;  Laterality: N/A;  . Implantable cardioverter defibrillator implant N/A 12/11/2011    Procedure: IMPLANTABLE CARDIOVERTER DEFIBRILLATOR IMPLANT;  Surgeon: Deboraha Sprang, MD;  Location: [facility] CATH LAB;  Service: Cardiovascular;  Laterality: N/A;  . Left and right heart catheterization with coronary/graft angiogram N/A 10/20/2013    Procedure: LEFT AND RIGHT HEART CATHETERIZATION WITH Beatrix Fetters;  Surgeon: Troy Sine, MD;  Location: [facility] CATH LAB;  Service: Cardiovascular;  Laterality: N/A;   Family History  Problem Relation Age of Onset  .  Heart attack Father   . Diabetes Father   . Breast cancer Mother    History  Substance Use Topics  . Smoking status: Current Every Day Smoker -- 0.50 packs/day for 50 years    Types: Cigarettes    Start date: 02/26/2012  . Smokeless tobacco: Never Used     Comment: he has tried to quit many times.  . Alcohol Use: No    Review of Systems  Constitutional: Negative for fever, chills, appetite change and fatigue.  HENT: Negative for congestion, ear pain, facial swelling, mouth sores and sore throat.   Eyes: Negative for visual disturbance.  Respiratory:  Positive for cough and shortness of breath. Negative for chest tightness.   Cardiovascular: Negative for chest pain and palpitations.  Gastrointestinal: Negative for nausea, vomiting, abdominal pain, diarrhea and blood in stool.  Endocrine: Negative for cold intolerance and heat intolerance.  Genitourinary: Negative for frequency, decreased urine volume and difficulty urinating.  Musculoskeletal: Negative for back pain and neck stiffness.  Skin: Negative for rash.  Neurological: Negative for dizziness, weakness, light-headedness and headaches.  Psychiatric/Behavioral: Positive for confusion. Negative for agitation.  All other systems reviewed and are negative.     Allergies  Review of patient's allergies indicates no known allergies.  Home Medications   Prior to Admission medications   Medication Sig Start Date End Date Taking? Authorizing Provider  aspirin EC 81 MG tablet Take 81 mg by mouth daily.    Yes Historical Provider, MD  atorvastatin (LIPITOR) 40 MG tablet TAKE ONE TABLET BY MOUTH ONCE DAILY. 12/01/14  Yes Thayer Headings, MD  clopidogrel (PLAVIX) 75 MG tablet Take 1 tablet (75 mg total) by mouth daily. 12/01/14  Yes Thayer Headings, MD  collagenase (SANTYL) ointment Apply topically daily. 09/03/14  Yes Hosie Poisson, MD  ipratropium-albuterol (DUONEB) 0.5-2.5 (3) MG/3ML SOLN Take 3 mLs by nebulization every 6 (six) hours as needed. 09/03/14  Yes Hosie Poisson, MD  NEEDLE, DISP, 22 G (B-D DISP NEEDLE 22GX3/4") 22G X 3/4" MISC 1 Syringe by Does not apply route daily. 10/22/13  Yes Olam Idler, MD  nitroGLYCERIN (NITROSTAT) 0.4 MG SL tablet Place 1 tablet (0.4 mg total) under the tongue every 5 (five) minutes as needed for chest pain. For chest pain 12/01/14  Yes Thayer Headings, MD  RELION INSULIN SYR 0.3ML/31G 31G X 5/16" 0.3 ML MISC USE THREE TIMES DAILY 10/07/14  Yes Darreld Mclean, MD  SYMBICORT 160-4.5 MCG/ACT inhaler INHALE TWO PUFFS BY MOUTH TWICE DAILY 11/24/14  Yes Gay Filler  Copland, MD  furosemide (LASIX) 40 MG tablet Take 1 tablet (40 mg total) by mouth every other day. 12/22/14   Archie Patten, MD  Insulin Glargine (LANTUS SOLOSTAR) 100 UNIT/ML Solostar Pen Inject 10 Units into the skin daily at 10 pm. 12/21/14   Archie Patten, MD  metoprolol tartrate (LOPRESSOR) 25 MG tablet Take 0.5 tablets (12.5 mg total) by mouth 2 (two) times daily. 12/21/14   Archie Patten, MD  predniSONE (DELTASONE) 10 MG tablet Starting 12/23/2014, take 30mg  (3 pills) for 3 days; then 20mg  (2 pills) for 4 days; then 10mg  (1 pill) daily 12/22/14   Archie Patten, MD   BP 101/63 mmHg  Pulse 82  Temp(Src) 98.3 F (36.8 C) (Oral)  Resp 18  Ht 5\' 10"  (1.778 m)  Wt 131 lb 6.4 oz (59.603 kg)  BMI 18.85 kg/m2  SpO2 97% Physical Exam  Constitutional: He is oriented to person, place, and time.  He appears well-nourished. He appears distressed.  Patient arrived on CPAP machine  HENT:  Head: Normocephalic and atraumatic.  Right Ear: External ear normal.  Left Ear: External ear normal.  Eyes: Pupils are equal, round, and reactive to light. Right eye exhibits no discharge. Left eye exhibits no discharge. No scleral icterus.  Neck: Normal range of motion. Neck supple.  Cardiovascular: Normal rate.  Exam reveals no gallop and no friction rub.   No murmur heard. Pulmonary/Chest: Accessory muscle usage present. No stridor. Tachypnea noted. He is in respiratory distress. He has decreased breath sounds (throughout). He has wheezes (Faint wheezing in the upper lobes). He has no rales. He exhibits no tenderness.  Abdominal: Soft. He exhibits no distension and no mass. There is no tenderness. There is no rebound and no guarding.  Musculoskeletal: He exhibits no edema or tenderness.  Chronic lower extremity edema  Neurological: He is alert and oriented to person, place, and time.  Skin: Skin is warm and dry. No rash noted. He is not diaphoretic. No erythema.    ED Course  Procedures (including  critical care time) Labs Review Labs Reviewed  MRSA PCR SCREENING - Abnormal; Notable for the following:    MRSA by PCR POSITIVE (*)    All other components within normal limits  BLOOD GAS, ARTERIAL - Abnormal; Notable for the following:    pH, Arterial 7.155 (*)    pCO2 arterial 100 (*)    pO2, Arterial 319 (*)    Bicarbonate 34.0 (*)    Acid-Base Excess 5.6 (*)    All other components within normal limits  HEPATIC FUNCTION PANEL - Abnormal; Notable for the following:    Albumin 3.3 (*)    ALT 10 (*)    All other components within normal limits  BLOOD GAS, ARTERIAL - Abnormal; Notable for the following:    pH, Arterial 7.242 (*)    pCO2 arterial 82.1 (*)    Bicarbonate 34.1 (*)    Acid-Base Excess 7.0 (*)    All other components within normal limits  BRAIN NATRIURETIC PEPTIDE - Abnormal; Notable for the following:    B Natriuretic Peptide 1851.5 (*)    All other components within normal limits  TROPONIN I - Abnormal; Notable for the following:    Troponin I 0.08 (*)    All other components within normal limits  TROPONIN I - Abnormal; Notable for the following:    Troponin I 0.08 (*)    All other components within normal limits  TROPONIN I - Abnormal; Notable for the following:    Troponin I 0.09 (*)    All other components within normal limits  BASIC METABOLIC PANEL - Abnormal; Notable for the following:    Chloride 94 (*)    Glucose, Bld 199 (*)    BUN 35 (*)    Creatinine, Ser 1.40 (*)    Calcium 8.5 (*)    GFR calc non Af Amer 50 (*)    GFR calc Af Amer 58 (*)    All other components within normal limits  CBC - Abnormal; Notable for the following:    Hemoglobin 10.9 (*)    MCH 23.1 (*)    MCHC 26.2 (*)    RDW 19.7 (*)    Platelets 99 (*)    All other components within normal limits  HEMOGLOBIN A1C - Abnormal; Notable for the following:    Hgb A1c MFr Bld 7.1 (*)    All other components within normal limits  BLOOD  GAS, ARTERIAL - Abnormal; Notable for the  following:    pH, Arterial 7.244 (*)    pCO2 arterial 84.9 (*)    pO2, Arterial 78.1 (*)    Bicarbonate 35.4 (*)    Acid-Base Excess 8.2 (*)    All other components within normal limits  LACTIC ACID, PLASMA - Abnormal; Notable for the following:    Lactic Acid, Venous 2.5 (*)    All other components within normal limits  LACTIC ACID, PLASMA - Abnormal; Notable for the following:    Lactic Acid, Venous 3.2 (*)    All other components within normal limits  GLUCOSE, CAPILLARY - Abnormal; Notable for the following:    Glucose-Capillary 326 (*)    All other components within normal limits  GLUCOSE, CAPILLARY - Abnormal; Notable for the following:    Glucose-Capillary 228 (*)    All other components within normal limits  LACTIC ACID, PLASMA - Abnormal; Notable for the following:    Lactic Acid, Venous 2.2 (*)    All other components within normal limits  GLUCOSE, CAPILLARY - Abnormal; Notable for the following:    Glucose-Capillary 187 (*)    All other components within normal limits  BLOOD GAS, ARTERIAL - Abnormal; Notable for the following:    pH, Arterial 7.301 (*)    pCO2 arterial 76.1 (*)    Bicarbonate 36.3 (*)    Acid-Base Excess 9.9 (*)    All other components within normal limits  GLUCOSE, CAPILLARY - Abnormal; Notable for the following:    Glucose-Capillary 204 (*)    All other components within normal limits  GLUCOSE, CAPILLARY - Abnormal; Notable for the following:    Glucose-Capillary 191 (*)    All other components within normal limits  CBC - Abnormal; Notable for the following:    Hemoglobin 10.0 (*)    HCT 37.0 (*)    MCH 23.1 (*)    MCHC 27.0 (*)    RDW 19.8 (*)    Platelets 101 (*)    All other components within normal limits  BASIC METABOLIC PANEL - Abnormal; Notable for the following:    Chloride 92 (*)    CO2 39 (*)    Glucose, Bld 205 (*)    BUN 41 (*)    Creatinine, Ser 1.42 (*)    GFR calc non Af Amer 49 (*)    GFR calc Af Amer 57 (*)    All  other components within normal limits  BLOOD GAS, ARTERIAL - Abnormal; Notable for the following:    pCO2 arterial 65.3 (*)    Bicarbonate 39.8 (*)    Acid-Base Excess 14.3 (*)    All other components within normal limits  GLUCOSE, CAPILLARY - Abnormal; Notable for the following:    Glucose-Capillary 185 (*)    All other components within normal limits  GLUCOSE, CAPILLARY - Abnormal; Notable for the following:    Glucose-Capillary 194 (*)    All other components within normal limits  CBC - Abnormal; Notable for the following:    Hemoglobin 10.5 (*)    HCT 37.7 (*)    MCH 23.0 (*)    MCHC 27.9 (*)    RDW 19.8 (*)    Platelets 105 (*)    All other components within normal limits  BASIC METABOLIC PANEL - Abnormal; Notable for the following:    Chloride 82 (*)    CO2 47 (*)    Glucose, Bld 355 (*)    BUN 41 (*)  Creatinine, Ser 1.43 (*)    GFR calc non Af Amer 48 (*)    GFR calc Af Amer 56 (*)    All other components within normal limits  GLUCOSE, CAPILLARY - Abnormal; Notable for the following:    Glucose-Capillary 271 (*)    All other components within normal limits  GLUCOSE, CAPILLARY - Abnormal; Notable for the following:    Glucose-Capillary 375 (*)    All other components within normal limits  GLUCOSE, CAPILLARY - Abnormal; Notable for the following:    Glucose-Capillary 334 (*)    All other components within normal limits  GLUCOSE, CAPILLARY - Abnormal; Notable for the following:    Glucose-Capillary 274 (*)    All other components within normal limits  GLUCOSE, CAPILLARY - Abnormal; Notable for the following:    Glucose-Capillary 197 (*)    All other components within normal limits  CBC - Abnormal; Notable for the following:    Hemoglobin 12.2 (*)    MCH 23.6 (*)    MCHC 28.6 (*)    RDW 19.8 (*)    Platelets 122 (*)    All other components within normal limits  BASIC METABOLIC PANEL - Abnormal; Notable for the following:    Chloride 82 (*)    CO2 43 (*)     Glucose, Bld 293 (*)    BUN 47 (*)    Creatinine, Ser 1.51 (*)    GFR calc non Af Amer 45 (*)    GFR calc Af Amer 53 (*)    All other components within normal limits  GLUCOSE, CAPILLARY - Abnormal; Notable for the following:    Glucose-Capillary 294 (*)    All other components within normal limits  GLUCOSE, CAPILLARY - Abnormal; Notable for the following:    Glucose-Capillary 263 (*)    All other components within normal limits  GLUCOSE, CAPILLARY - Abnormal; Notable for the following:    Glucose-Capillary 180 (*)    All other components within normal limits  BASIC METABOLIC PANEL - Abnormal; Notable for the following:    Chloride 82 (*)    CO2 45 (*)    Glucose, Bld 213 (*)    BUN 70 (*)    Creatinine, Ser 1.98 (*)    Calcium 8.8 (*)    GFR calc non Af Amer 33 (*)    GFR calc Af Amer 38 (*)    All other components within normal limits  CBC - Abnormal; Notable for the following:    Hemoglobin 12.1 (*)    MCH 23.5 (*)    MCHC 29.4 (*)    RDW 20.0 (*)    Platelets 108 (*)    All other components within normal limits  GLUCOSE, CAPILLARY - Abnormal; Notable for the following:    Glucose-Capillary 177 (*)    All other components within normal limits  GLUCOSE, CAPILLARY - Abnormal; Notable for the following:    Glucose-Capillary 197 (*)    All other components within normal limits  GLUCOSE, CAPILLARY - Abnormal; Notable for the following:    Glucose-Capillary 309 (*)    All other components within normal limits  GLUCOSE, CAPILLARY - Abnormal; Notable for the following:    Glucose-Capillary 170 (*)    All other components within normal limits  GLUCOSE, CAPILLARY - Abnormal; Notable for the following:    Glucose-Capillary 184 (*)    All other components within normal limits  GLUCOSE, CAPILLARY - Abnormal; Notable for the following:    Glucose-Capillary 151 (*)  All other components within normal limits  GLUCOSE, CAPILLARY - Abnormal; Notable for the following:     Glucose-Capillary 125 (*)    All other components within normal limits  GLUCOSE, CAPILLARY - Abnormal; Notable for the following:    Glucose-Capillary 133 (*)    All other components within normal limits  GLUCOSE, CAPILLARY - Abnormal; Notable for the following:    Glucose-Capillary 165 (*)    All other components within normal limits  GLUCOSE, CAPILLARY - Abnormal; Notable for the following:    Glucose-Capillary 29 (*)    All other components within normal limits  GLUCOSE, CAPILLARY - Abnormal; Notable for the following:    Glucose-Capillary 28 (*)    All other components within normal limits  GLUCOSE, CAPILLARY - Abnormal; Notable for the following:    Glucose-Capillary 161 (*)    All other components within normal limits  BASIC METABOLIC PANEL - Abnormal; Notable for the following:    Chloride 86 (*)    CO2 45 (*)    Glucose, Bld 158 (*)    BUN 95 (*)    Creatinine, Ser 2.53 (*)    Calcium 8.5 (*)    GFR calc non Af Amer 24 (*)    GFR calc Af Amer 28 (*)    All other components within normal limits  GLUCOSE, CAPILLARY - Abnormal; Notable for the following:    Glucose-Capillary 145 (*)    All other components within normal limits  GLUCOSE, CAPILLARY - Abnormal; Notable for the following:    Glucose-Capillary 288 (*)    All other components within normal limits  BASIC METABOLIC PANEL - Abnormal; Notable for the following:    Chloride 84 (*)    CO2 45 (*)    Glucose, Bld 219 (*)    BUN 101 (*)    Creatinine, Ser 2.56 (*)    Calcium 8.8 (*)    GFR calc non Af Amer 24 (*)    GFR calc Af Amer 28 (*)    All other components within normal limits  GLUCOSE, CAPILLARY - Abnormal; Notable for the following:    Glucose-Capillary 320 (*)    All other components within normal limits  GLUCOSE, CAPILLARY - Abnormal; Notable for the following:    Glucose-Capillary 193 (*)    All other components within normal limits  MAGNESIUM - Abnormal; Notable for the following:    Magnesium 2.5  (*)    All other components within normal limits  GLUCOSE, CAPILLARY - Abnormal; Notable for the following:    Glucose-Capillary 188 (*)    All other components within normal limits  I-STAT CHEM 8, ED - Abnormal; Notable for the following:    Potassium 5.2 (*)    Chloride 95 (*)    BUN 45 (*)    Creatinine, Ser 1.50 (*)    Glucose, Bld 199 (*)    All other components within normal limits  I-STAT CG4 LACTIC ACID, ED - Abnormal; Notable for the following:    Lactic Acid, Venous 2.33 (*)    All other components within normal limits  POCT I-STAT 3, ART BLOOD GAS (G3+) - Abnormal; Notable for the following:    pH, Arterial 7.282 (*)    pCO2 arterial 79.7 (*)    pO2, Arterial 72.0 (*)    Bicarbonate 37.7 (*)    Acid-Base Excess 8.0 (*)    All other components within normal limits  CULTURE, BLOOD (ROUTINE X 2)  CULTURE, BLOOD (ROUTINE X 2)  LIPID PANEL  LACTIC ACID, PLASMA  GLUCOSE, CAPILLARY  I-STAT TROPOININ, ED    Imaging Review No results found.   EKG Interpretation   Date/Time:  Wednesday December 14 2014 19:44:22 EDT Ventricular Rate:  86 PR Interval:  149 QRS Duration: 133 QT Interval:  448 QTC Calculation: 536 R Axis:   -80 Text Interpretation:  Age not entered, assumed to be  69 years old for  purpose of ECG interpretation Sinus rhythm Ventricular premature complex  Left bundle branch block Baseline wander in lead(s) V2 V5 ED PHYSICIAN  INTERPRETATION AVAILABLE IN [facility] [place] Confirmed by TEST, Record  ([id number]) on 12/15/2014 6:55:57 AM Also confirmed by Hendy Brindle  MD, Kristle Wesch  ([id number])  on 12/29/2014 6:57:57 AM      MDM   69 year old male with a history of CAD status post stenting and CABG, CHF with an EF of 25-30% status post ICD placement, COPD on home O2 who presents with altered mental status after being found at home with his oxygen cannula off his face. Last known normal was yesterday afternoon. The daughter states that she went to his house to which were happy  birthday and found him in his recliner confused.   On EMS arrival patient had decreased rest sounds throughout with mild wheezing and was placed on oxygen and CPAP which resulted in improved mental status. He remained hemodynamically stable in route.   On arrival he was hemodynamically stable and was taken off the CPAP to transitioned to BiPAP and has saturations that dropped into the low 70s. During those were initiated and patient was given steroids. ABG revealed respiratory acidosis with pH of 7.1 and a PCO2 of 100. EKG difficult to interpret given artifact versus possible new left bundle branch block. No evidence of acute ischemia noted. Chest x-ray revealed no evidence of pneumonia, pneumothorax, pulmonary edema. However did show chronic lung changes and small pleural effusions. Patient mentation continued to improve. On repeat ABG, pH and PCO2 had improvement. Patient was admitted to stepdown unit under family medicine for further management.  She is seen in conjunction with Dr. Tamera Punt.  Sibyl Parr, M.D. Resident  Final diagnoses:  Respiratory distress secondary to COPD exacerbation and CHF exacerbation Hypercarbia Altered mental status    I saw and evaluated the patient, reviewed the resident's note and I agree with the findings and plan.   EKG Interpretation   Date/Time:  Wednesday December 14 2014 19:44:22 EDT Ventricular Rate:  86 PR Interval:  149 QRS Duration: 133 QT Interval:  448 QTC Calculation: 536 R Axis:   -80 Text Interpretation:  Age not entered, assumed to be  69 years old for  purpose of ECG interpretation Sinus rhythm Ventricular premature complex  Left bundle branch block Baseline wander in lead(s) V2 V5 ED PHYSICIAN  INTERPRETATION AVAILABLE IN [facility] [place] Confirmed by TEST, Record  ([id number]) on 12/15/2014 6:55:57 AM Also confirmed by Rizwan Kuyper  MD, Dawnelle Warman  ([id number])  on 12/29/2014 6:57:57 AM      CRITICAL CARE Performed by: Ventura Hollenbeck Total critical  care time: 40 Critical care time was exclusive of separately billable procedures and treating other patients. Critical care was necessary to treat or prevent imminent or life-threatening deterioration. Critical care was time spent personally by me on the following activities: development of treatment plan with patient and/or surrogate as well as nursing, discussions with consultants, evaluation of patient's response to treatment, examination of patient, obtaining history from patient or surrogate, ordering and performing treatments and interventions, ordering and review of laboratory  studies, ordering and review of radiographic studies, pulse oximetry and re-evaluation of patient's condition.    Addison Lank, MD 12/15/14 [id number]  Malvin Johns, MD 12/17/14 [id number]  Malvin Johns, MD 12/29/14 [place], MD 12/29/14 [phone]

## 2014-12-14 NOTE — Progress Notes (Signed)
RT informed RN and resident MD of critical results from ABG

## 2014-12-14 NOTE — Progress Notes (Signed)
RT obtained ABG per order, results on iSTAT were not within range, RT sent blood up to RT department to get more accurate results.

## 2014-12-14 NOTE — ED Notes (Signed)
Pt arrived via EMS c/o SOB.  Family went to check on pt noticed O2 was off and pt had a decreased LOC family called 911.  EMS placed on Cpap
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
## 2014-12-15 ENCOUNTER — Inpatient Hospital Stay (HOSPITAL_COMMUNITY): Payer: PPO

## 2014-12-15 DIAGNOSIS — I509 Heart failure, unspecified: Secondary | ICD-10-CM

## 2014-12-15 DIAGNOSIS — J9601 Acute respiratory failure with hypoxia: Secondary | ICD-10-CM | POA: Insufficient documentation

## 2014-12-15 DIAGNOSIS — R778 Other specified abnormalities of plasma proteins: Secondary | ICD-10-CM | POA: Insufficient documentation

## 2014-12-15 DIAGNOSIS — J441 Chronic obstructive pulmonary disease with (acute) exacerbation: Secondary | ICD-10-CM | POA: Insufficient documentation

## 2014-12-15 DIAGNOSIS — L8990 Pressure ulcer of unspecified site, unspecified stage: Secondary | ICD-10-CM | POA: Insufficient documentation

## 2014-12-15 DIAGNOSIS — J9602 Acute respiratory failure with hypercapnia: Secondary | ICD-10-CM

## 2014-12-15 DIAGNOSIS — I5043 Acute on chronic combined systolic (congestive) and diastolic (congestive) heart failure: Principal | ICD-10-CM

## 2014-12-15 DIAGNOSIS — R400 Somnolence: Secondary | ICD-10-CM | POA: Insufficient documentation

## 2014-12-15 DIAGNOSIS — R0600 Dyspnea, unspecified: Secondary | ICD-10-CM

## 2014-12-15 DIAGNOSIS — J9622 Acute and chronic respiratory failure with hypercapnia: Secondary | ICD-10-CM

## 2014-12-15 DIAGNOSIS — R7989 Other specified abnormal findings of blood chemistry: Secondary | ICD-10-CM

## 2014-12-15 LAB — LIPID PANEL
Cholesterol: 89 mg/dL (ref 0–200)
HDL: 42 mg/dL (ref >40)
LDL CALC: 35 mg/dL (ref 0–99)
TRIGLYCERIDES: 60 mg/dL (ref <150)
Total CHOL/HDL Ratio: 2.1 RATIO
VLDL: 12 mg/dL (ref 0–40)

## 2014-12-15 LAB — BLOOD GAS, ARTERIAL
ACID-BASE EXCESS: 9.9 mmol/L — AB (ref 0.0–2.0)
Acid-Base Excess: 8.2 mmol/L — ABNORMAL HIGH (ref 0.0–2.0)
BICARBONATE: 35.4 meq/L — AB (ref 20.0–24.0)
Bicarbonate: 36.3 mEq/L — ABNORMAL HIGH (ref 20.0–24.0)
DELIVERY SYSTEMS: POSITIVE
DRAWN BY: 398981
DRAWN BY: 34560
Delivery systems: POSITIVE
EXPIRATORY PAP: 8
Expiratory PAP: 8
FIO2: 0.4 %
FIO2: 0.4 %
INSPIRATORY PAP: 16
INSPIRATORY PAP: 16
O2 Saturation: 93.4 %
O2 Saturation: 97.4 %
PATIENT TEMPERATURE: 98.6
PCO2 ART: 84.9 mmHg — AB (ref 35.0–45.0)
PO2 ART: 78.1 mmHg — AB (ref 80.0–100.0)
Patient temperature: 98.6
RATE: 15 resp/min
TCO2: 38 mmol/L (ref 0–100)
TCO2: 38.7 mmol/L (ref 0–100)
pCO2 arterial: 76.1 mmHg (ref 35.0–45.0)
pH, Arterial: 7.244 — ABNORMAL LOW (ref 7.350–7.450)
pH, Arterial: 7.301 — ABNORMAL LOW (ref 7.350–7.450)
pO2, Arterial: 92.6 mmHg (ref 80.0–100.0)

## 2014-12-15 LAB — CBC
HCT: 41.3 % (ref 39.0–52.0)
Hemoglobin: 10.9 g/dL — ABNORMAL LOW (ref 13.0–17.0)
MCH: 23.1 pg — ABNORMAL LOW (ref 26.0–34.0)
MCHC: 26.2 g/dL — ABNORMAL LOW (ref 30.0–36.0)
MCV: 88.2 fL (ref 78.0–100.0)
Platelets: 99 10*3/uL — ABNORMAL LOW (ref 150–400)
RBC: 4.68 MIL/uL (ref 4.22–5.81)
RDW: 19.7 % — ABNORMAL HIGH (ref 11.5–15.5)
WBC: 4.7 10*3/uL (ref 4.0–10.5)

## 2014-12-15 LAB — MRSA PCR SCREENING: MRSA BY PCR: POSITIVE — AB

## 2014-12-15 LAB — LACTIC ACID, PLASMA
LACTIC ACID, VENOUS: 2.2 mmol/L — AB (ref 0.5–2.0)
LACTIC ACID, VENOUS: 2.5 mmol/L — AB (ref 0.5–2.0)
LACTIC ACID, VENOUS: 3.2 mmol/L — AB (ref 0.5–2.0)
Lactic Acid, Venous: 2 mmol/L (ref 0.5–2.0)

## 2014-12-15 LAB — BASIC METABOLIC PANEL
ANION GAP: 12 (ref 5–15)
BUN: 35 mg/dL — ABNORMAL HIGH (ref 6–20)
CO2: 32 mmol/L (ref 22–32)
CREATININE: 1.4 mg/dL — AB (ref 0.61–1.24)
Calcium: 8.5 mg/dL — ABNORMAL LOW (ref 8.9–10.3)
Chloride: 94 mmol/L — ABNORMAL LOW (ref 101–111)
GFR calc Af Amer: 58 mL/min — ABNORMAL LOW (ref >60)
GFR, EST NON AFRICAN AMERICAN: 50 mL/min — AB (ref >60)
Glucose, Bld: 199 mg/dL — ABNORMAL HIGH (ref 65–99)
Potassium: 5 mmol/L (ref 3.5–5.1)
SODIUM: 138 mmol/L (ref 135–145)

## 2014-12-15 LAB — POCT I-STAT 3, ART BLOOD GAS (G3+)
Acid-Base Excess: 8 mmol/L — ABNORMAL HIGH (ref 0.0–2.0)
BICARBONATE: 37.7 meq/L — AB (ref 20.0–24.0)
O2 SAT: 91 %
PCO2 ART: 79.7 mmHg — AB (ref 35.0–45.0)
PO2 ART: 72 mmHg — AB (ref 80.0–100.0)
TCO2: 40 mmol/L (ref 0–100)
pH, Arterial: 7.282 — ABNORMAL LOW (ref 7.350–7.450)

## 2014-12-15 LAB — TROPONIN I
Troponin I: 0.08 ng/mL — ABNORMAL HIGH (ref <0.031)
Troponin I: 0.08 ng/mL — ABNORMAL HIGH (ref <0.031)
Troponin I: 0.09 ng/mL — ABNORMAL HIGH (ref <0.031)

## 2014-12-15 LAB — GLUCOSE, CAPILLARY
GLUCOSE-CAPILLARY: 187 mg/dL — AB (ref 65–99)
Glucose-Capillary: 191 mg/dL — ABNORMAL HIGH (ref 65–99)
Glucose-Capillary: 204 mg/dL — ABNORMAL HIGH (ref 65–99)
Glucose-Capillary: 228 mg/dL — ABNORMAL HIGH (ref 65–99)
Glucose-Capillary: 326 mg/dL — ABNORMAL HIGH (ref 65–99)

## 2014-12-15 LAB — BRAIN NATRIURETIC PEPTIDE: B NATRIURETIC PEPTIDE 5: 1851.5 pg/mL — AB (ref 0.0–100.0)

## 2014-12-15 MED ORDER — LEVOFLOXACIN IN D5W 500 MG/100ML IV SOLN
500.0000 mg | Freq: Every day | INTRAVENOUS | Status: DC
  Administered 2014-12-15 – 2014-12-16 (×2): 500 mg via INTRAVENOUS
  Filled 2014-12-15 (×3): qty 100

## 2014-12-15 MED ORDER — ONDANSETRON HCL 4 MG/2ML IJ SOLN
INTRAMUSCULAR | Status: AC
  Administered 2014-12-15: 4 mg via INTRAVENOUS
  Filled 2014-12-15: qty 2

## 2014-12-15 MED ORDER — FUROSEMIDE 10 MG/ML IJ SOLN
80.0000 mg | Freq: Two times a day (BID) | INTRAMUSCULAR | Status: DC
  Administered 2014-12-15 – 2014-12-17 (×4): 80 mg via INTRAVENOUS
  Filled 2014-12-15 (×6): qty 8

## 2014-12-15 MED ORDER — CHLORHEXIDINE GLUCONATE CLOTH 2 % EX PADS
6.0000 | MEDICATED_PAD | Freq: Every day | CUTANEOUS | Status: AC
  Administered 2014-12-15 – 2014-12-19 (×5): 6 via TOPICAL

## 2014-12-15 MED ORDER — MUPIROCIN 2 % EX OINT
1.0000 "application " | TOPICAL_OINTMENT | Freq: Two times a day (BID) | CUTANEOUS | Status: AC
  Administered 2014-12-15 – 2014-12-19 (×9): 1 via NASAL
  Filled 2014-12-15: qty 22

## 2014-12-15 MED ORDER — SODIUM CHLORIDE 0.9 % IV BOLUS (SEPSIS)
500.0000 mL | Freq: Once | INTRAVENOUS | Status: AC
  Administered 2014-12-15: 500 mL via INTRAVENOUS

## 2014-12-15 MED ORDER — FUROSEMIDE 10 MG/ML IJ SOLN
40.0000 mg | Freq: Every day | INTRAMUSCULAR | Status: DC
  Administered 2014-12-15: 40 mg via INTRAVENOUS
  Filled 2014-12-15: qty 4

## 2014-12-15 MED ORDER — ARFORMOTEROL TARTRATE 15 MCG/2ML IN NEBU
15.0000 ug | INHALATION_SOLUTION | Freq: Two times a day (BID) | RESPIRATORY_TRACT | Status: DC
  Administered 2014-12-15 – 2014-12-22 (×14): 15 ug via RESPIRATORY_TRACT
  Filled 2014-12-15 (×18): qty 2

## 2014-12-15 MED ORDER — ONDANSETRON HCL 4 MG/2ML IJ SOLN
4.0000 mg | Freq: Three times a day (TID) | INTRAMUSCULAR | Status: DC | PRN
  Administered 2014-12-15: 4 mg via INTRAVENOUS

## 2014-12-15 MED ORDER — INSULIN ASPART 100 UNIT/ML ~~LOC~~ SOLN
0.0000 [IU] | Freq: Every day | SUBCUTANEOUS | Status: DC
  Administered 2014-12-15: 4 [IU] via SUBCUTANEOUS
  Administered 2014-12-16: 5 [IU] via SUBCUTANEOUS
  Administered 2014-12-17: 3 [IU] via SUBCUTANEOUS
  Administered 2014-12-21: 4 [IU] via SUBCUTANEOUS

## 2014-12-15 MED ORDER — INSULIN ASPART 100 UNIT/ML ~~LOC~~ SOLN
0.0000 [IU] | Freq: Three times a day (TID) | SUBCUTANEOUS | Status: DC
  Administered 2014-12-15 (×2): 3 [IU] via SUBCUTANEOUS
  Administered 2014-12-15 – 2014-12-16 (×3): 2 [IU] via SUBCUTANEOUS
  Administered 2014-12-16 – 2014-12-17 (×2): 5 [IU] via SUBCUTANEOUS
  Administered 2014-12-17: 2 [IU] via SUBCUTANEOUS
  Administered 2014-12-17: 7 [IU] via SUBCUTANEOUS
  Administered 2014-12-18 (×2): 2 [IU] via SUBCUTANEOUS
  Administered 2014-12-18: 5 [IU] via SUBCUTANEOUS
  Administered 2014-12-19: 7 [IU] via SUBCUTANEOUS
  Administered 2014-12-19 (×2): 2 [IU] via SUBCUTANEOUS
  Administered 2014-12-20 (×2): 1 [IU] via SUBCUTANEOUS
  Administered 2014-12-21: 5 [IU] via SUBCUTANEOUS
  Administered 2014-12-22 (×2): 2 [IU] via SUBCUTANEOUS

## 2014-12-15 MED ORDER — LEVOFLOXACIN IN D5W 750 MG/150ML IV SOLN
750.0000 mg | Freq: Every day | INTRAVENOUS | Status: DC
  Administered 2014-12-15: 750 mg via INTRAVENOUS
  Filled 2014-12-15 (×2): qty 150

## 2014-12-15 MED ORDER — BUDESONIDE 0.25 MG/2ML IN SUSP
0.5000 mg | Freq: Two times a day (BID) | RESPIRATORY_TRACT | Status: DC
  Administered 2014-12-15 – 2014-12-17 (×4): 0.5 mg via RESPIRATORY_TRACT
  Filled 2014-12-15 (×6): qty 4

## 2014-12-15 NOTE — Evaluation (Signed)
Occupational Therapy Evaluation Patient Details Name: James Moon MRN: [id number] DOB: [birth year] Today's Date: 12/15/2014    History of Present Illness This 69 y.o. smoker admitted with progressive SOB, confusion and LE swelling.   Pt started on BiPAP.  Diagnosed with acute on chronic respiratory failure with CHF adn COPD exacerbation.  Troponin elevated, but per cardiology likey due to respiratory distress and not cardiac related.  PMH includes:  COPD, DM, colon CA, HTN, CAD, ischemic cardiomyopahty, tobacco abuse, chronic systolic CHF, NSVT, AICD ([employer]), thrombocytopenia, implantable cardioverter defibrillator implant, s/p CABG   Clinical Impression   Pt admitted with above. He demonstrates the below listed deficits and will benefit from continued OT to maximize safety and independence with BADLs.  Pt presents to OT with generalized weakness and deconditioning.  He moves quite well (light min A), and is able to perform ADLs with min A on BiPAP.  He does fatigue with activity.   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   Will follow acutely.       Follow Up Recommendations  Home health OT;Supervision/Assistance - 24 hour    Equipment Recommendations  Tub/shower bench    Recommendations for Other Services       Precautions / Restrictions Precautions Precautions: Fall      Mobility Bed Mobility Overal bed mobility: Needs Assistance Bed Mobility: Sidelying to Sit;Supine to Sit   Sidelying to sit: Supervision Supine to sit: Supervision        Transfers Overall transfer level: Needs assistance   Transfers: Sit to/from Stand;Stand Pivot Transfers Sit to Stand: Min assist Stand pivot transfers: Min assist       General transfer comment: Pt is mildly unsteady requiring min A for balance.     Balance Overall balance assessment: Needs assistance Sitting-balance support: Feet supported Sitting balance-Leahy Scale:  Good     Standing balance support: Single extremity supported Standing balance-Leahy Scale: Poor Standing balance comment: requires UE support and occasional min A                             ADL Overall ADL's : Needs assistance/impaired Eating/Feeding: Independent   Grooming: Wash/dry hands;Wash/dry face;Oral care;Brushing hair;Set up;Sitting   Upper Body Bathing: Set up;Sitting   Lower Body Bathing: Minimal assistance;Sit to/from stand   Upper Body Dressing : Set up;Sitting   Lower Body Dressing: Minimal assistance;Sit to/from stand   Toilet Transfer: Minimal assistance;Stand-pivot;BSC   Toileting- Clothing Manipulation and Hygiene: Minimal assistance;Sit to/from stand       Functional mobility during ADLs: Minimal assistance General ADL Comments: Pt moves well.  Fatigues after ~ 4 mins standing.  He requires min A for balance      Vision     Perception     Praxis      Pertinent Vitals/Pain Pain Assessment: Faces Pain Score: 0-No pain Faces Pain Scale: No hurt     Hand Dominance Right   Extremity/Trunk Assessment Upper Extremity Assessment Upper Extremity Assessment: Generalized weakness   Lower Extremity Assessment Lower Extremity Assessment: Generalized weakness   Cervical / Trunk Assessment Cervical / Trunk Assessment: Kyphotic   Communication Communication Communication: HOH   Cognition Arousal/Alertness: Awake/alert Behavior During Therapy: WFL for tasks assessed/performed Overall Cognitive Status: Difficult to assess                     General Comments  Exercises       Shoulder Instructions      Home Living Family/patient expects to be discharged to:: Private residence Living Arrangements: Alone Available Help at Discharge: Family;Available PRN/intermittently Type of Home: Apartment Home Access: Stairs to enter Entrance Stairs-Number of Steps: 2   Home Layout: One level     Bathroom Shower/Tub:  Tub/shower unit Shower/tub characteristics: Architectural technologist: Standard     Home Equipment: None   Additional Comments: Info is what pt reported during eval; however disprepencies noted from admission 09/14/14 admission where pt stated he had 10 steps to enter apt and has a RW.  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 Diagnosis: Generalized weakness   OT Problem List: Decreased strength;Impaired balance (sitting and/or standing);Decreased activity tolerance;Decreased safety awareness;Decreased knowledge of use of DME or AE;Cardiopulmonary status limiting activity   OT Treatment/Interventions: Self-care/ADL training;Therapeutic exercise;DME and/or AE instruction;Therapeutic activities;Patient/family education;Balance training    OT Goals(Current goals can be found in the care plan section) Acute Rehab OT Goals Patient Stated Goal: Pt did not state  OT Goal Formulation: With patient Time For Goal Achievement: 12/29/14 Potential to Achieve Goals: Good ADL Goals Pt Will Perform Grooming: with modified independence;standing Pt Will Perform Upper Body Bathing: with modified independence;sitting Pt Will Perform Lower Body Bathing: with modified independence;sit to/from stand Pt Will Perform Upper Body Dressing: with modified independence;sitting Pt Will Perform Lower Body Dressing: with modified independence;sit to/from stand Pt Will Transfer to Toilet: with modified independence;ambulating;regular height toilet;bedside commode;grab bars Pt Will Perform Toileting - Clothing Manipulation and hygiene: with modified independence;sit to/from stand Pt Will Perform Tub/Shower Transfer: Tub transfer;with modified independence;ambulating;shower seat;tub bench;rolling walker  OT Frequency: Min 2X/week   Barriers to D/C:  Decreased caregiver support  unsure how much family can assist        Co-evaluation PT/OT/SLP Co-Evaluation/Treatment: Yes Reason for Co-Treatment: Complexity of the patient's impairments (multi-system involvement)   OT goals addressed during session: Strengthening/ROM      End of Session Equipment Utilized During Treatment: Oxygen;Other (comment) (BiPAP) Nurse Communication: Mobility status  Activity Tolerance: Patient limited by fatigue Patient left: in bed;with call bell/phone within reach   Time: 2563-8937 OT Time Calculation (min): 37 min Charges:  OT General Charges $OT Visit: 1 Procedure OT Evaluation $Initial OT Evaluation Tier I: 1 Procedure G-Codes:    Lucille Passy M 01-07-15, 2:59 PM

## 2014-12-15 NOTE — Progress Notes (Signed)
Paged DR w/ABG results

## 2014-12-15 NOTE — Evaluation (Signed)
Physical Therapy Evaluation Patient Details Name: James Moon MRN: [id number] DOB: 1945/08/26 Today's Date: 12/15/2014   History of Present Illness  This 69 y.o. smoker admitted with progressive SOB, confusion and LE swelling.   Pt started on BiPAP.  Diagnosed with acute on chronic respiratory failure with CHF adn COPD exacerbation.  Troponin elevated, but per cardiology likey due to respiratory distress and not cardiac related.  PMH includes:  COPD, DM, colon CA, HTN, CAD, ischemic cardiomyopahty, tobacco abuse, chronic systolic CHF, NSVT, AICD ([facility]), thrombocytopenia, implantable cardioverter defibrillator implant, s/p CABG  Clinical Impression  Pt admitted with/for respiratory failure superimposed over CHF and COPD.  Pt currently limited functionally due to the problems listed below.  (see problems list.)  Pt will benefit from PT to maximize function and safety to be able to get home safely with limited available assist .     Follow Up Recommendations Home health PT;Other (comment) (unless pt doesn't wean well)    Equipment Recommendations       Recommendations for Other Services       Precautions / Restrictions Precautions Precautions: Fall      Mobility  Bed Mobility Overal bed mobility: Needs Assistance Bed Mobility: Sidelying to Sit;Supine to Sit   Sidelying to sit: Supervision Supine to sit: Supervision        Transfers Overall transfer level: Needs assistance   Transfers: Sit to/from Stand Sit to Stand: Min assist Stand pivot transfers: Min assist       General transfer comment: Pt is mildly unsteady requiring min A for balance.   Ambulation/Gait Ambulation/Gait assistance: Min assist Ambulation Distance (Feet): 4 Feet (forward and back to extent of vent)   Gait Pattern/deviations: Step-through pattern     General Gait Details: mildly unsteady, holding to equipment  Stairs            Wheelchair Mobility    Modified Rankin (Stroke  Patients Only)       Balance Overall balance assessment: Needs assistance Sitting-balance support: Feet supported Sitting balance-Leahy Scale: Good     Standing balance support: Single extremity supported Standing balance-Leahy Scale: Poor Standing balance comment: requires UE support and occasional min A                              Pertinent Vitals/Pain Pain Assessment: Faces Pain Score: 0-No pain Faces Pain Scale: No hurt    Home Living Family/patient expects to be discharged to:: Private residence Living Arrangements: Alone Available Help at Discharge: Family;Available PRN/intermittently Type of Home: Apartment Home Access: Stairs to enter   Entrance Stairs-Number of Steps: 2 Home Layout: One level Home Equipment: None Additional Comments: Info is what pt reported during eval; however disprepencies noted from admission 09/14/14 admission where pt stated he had 10 steps to enter apt and has a RW.  No family present to clarify information      Prior Function Level of Independence: Independent         Comments: Pt reports he drives and works full time at [employer] as a Clinical research associate   Dominant Hand: Right    Extremity/Trunk Assessment   Upper Extremity Assessment: Defer to OT evaluation           Lower Extremity Assessment: Overall WFL for tasks assessed (mild weakness only)      Cervical / Trunk Assessment: Kyphotic  Communication   Communication: HOH  Cognition Arousal/Alertness:  Awake/alert Behavior During Therapy: WFL for tasks assessed/performed Overall Cognitive Status: Difficult to assess                      General Comments General comments (skin integrity, edema, etc.): Able to stand x ~ 76mins before fatiguing.  02 sats 87-95% on BiPAP.       Exercises        Assessment/Plan    PT Assessment Patient needs continued PT services  PT Diagnosis Difficulty walking;Generalized weakness   PT Problem  List Decreased strength;Decreased activity tolerance;Decreased balance;Decreased mobility;Decreased knowledge of use of DME;Cardiopulmonary status limiting activity  PT Treatment Interventions DME instruction;Gait training;Stair training;Functional mobility training;Therapeutic activities;Balance training;Patient/family education   PT Goals (Current goals can be found in the Care Plan section) Acute Rehab PT Goals Patient Stated Goal: Pt did not state  PT Goal Formulation: With patient Time For Goal Achievement: 12/29/14 Potential to Achieve Goals: Good    Frequency Min 3X/week   Barriers to discharge Decreased caregiver support      Co-evaluation PT/OT/SLP Co-Evaluation/Treatment: Yes Reason for Co-Treatment: For patient/therapist safety PT goals addressed during session: Mobility/safety with mobility OT goals addressed during session: Strengthening/ROM       End of Session   Activity Tolerance: Patient tolerated treatment well Patient left: in bed;with call bell/phone within reach Nurse Communication: Mobility status         Time: 7989-2119 PT Time Calculation (min) (ACUTE ONLY): 27 min   Charges:   PT Evaluation $Initial PT Evaluation Tier I: 1 Procedure     PT G Codes:        Rondo Spittler, Tessie Fass 12/15/2014, 3:33 PM 12/15/2014  Donnella Sham, PT [phone]  (pager)

## 2014-12-15 NOTE — Progress Notes (Signed)
CRITICAL VALUE ALERT  Critical value received:  Lactic Acid 2.5  Date of notification:  12/15/2014  Time of notification:  0110  Critical value read back:Yes.    Nurse who received alert:  Dorene Grebe, RN  MD notified (1st page):  Family Medicine Teaching Service pager  Time of first page:  0112  MD notified (2nd page): Family Medicine Teaching Service pager  Time of second page: 0122  Responding MD:  Dr. Brita Romp  Time MD responded:  0124, no new orders received; will continue to monitor.

## 2014-12-15 NOTE — Care Management Note (Addendum)
Case Management Note  Patient Details  Name: James Moon MRN: [id number] Date of Birth: 04/06/1946  Subjective/Objective:        Pt from home alone admitted with altered mental status and respiratory failure- home 02,  hx of copd and congestive heart failure.      Action/Plan:  Return to home when medically stable. CM to f/u with discharge disposition. Expected Discharge Date:                  Expected Discharge Plan:  [place]  In-House Referral:     Discharge planning Services  CM Consult  Post Acute Care Choice:    Choice offered to:     DME Arranged:  O[facility] (active with [facility] homecare [facility]) DME Agency:  [place]:    [facility] Agency:     Status of Service:  In process, will continue to follow  Medicare Important Message Given:    Date Medicare IM Given:    Medicare IM give by:    Date Additional Medicare IM Given:    Additional Medicare Important Message give by:     If discussed at [place] of Stay Meetings, dates discussed:    Additional CommentsLenna Sciara [phone] Sharin Mons, RN 12/15/2014, 12:00 PM

## 2014-12-15 NOTE — Progress Notes (Signed)
Family Medicine Teaching Service Daily Progress Note Intern Pager: [phone]  Patient name: FYNN VANBLARCOM Medical record number: [id number] Date of birth: 10-Nov-1945 Age: 69 y.o. Gender: male  Primary Care Provider: Lamar Blinks, MD Consultants: CHF Code Status: full code per daughter  Pt Overview and Major Events to Date:  6/15: Patient unresponsive in acute respiratory failure requiring BiPAP.   Assessment and Plan: WILFORD MERRYFIELD is a 69 y.o. male presenting with AMS and respiratory failure . PMH is significant for COPD on home O2, chronic combined systolic and diastolic CHF, K5LZ, tobacco abuse.  Acute on chronic respiratory failure, Respiratory acidosis in setting of COPD and CHF: Presenting with altered mental status that has somewhat improved per EDP with BiPAP. Desats to 70s when taken off of BiPAP per ED (though not recorded). Afebrile, BP slightly soft to sBP in 100s, VS otherwise stable. ABG shows respiratory acidosis with pH 7.155, pCO2 100, pO2 319, bicarb 34 that improved slightly to pH 7.242, pCO2 82, and pO2 88.9 with BiPAP in the ED. Initial CXR shows chronic bronchitic changes with no infiltrates or edema. Initial troponin 0.06, EKG with LBBB (possibly new since last EKG), PVC, baseline wander, no ST elevations. Given changes in EKG and elevation in trops: 0.06>0.08>0.08, and significant cardiac h/o, ACS must be ruled out. Additionally, BNP noted to be elevated at 1851 and patient's last EF was noted to be 25-30%, there could be a component of CHF exacerbation as well. Very difficult to obtain a history from the patient, however given he was not using his home O2, this could be a COPD component.  S/p solu-medrol in ED. - Continue to monitor closely in SDU - patient was on NRB: was jerking with O2 sats in 70s (however not a great waveform). Was replaced back on BiPAP - RN to replace pulse ox and move location for better readings, respiratory to place pt back on BiPAP. -  Repeat ABG 30 minutes after being on BiPAP to assess respiratory acidosis (pCO2 84.9, pH 7.244, and pO2 78.1).  - prednisone 50mg  to complete 5 days. - Levaquin IV 750mg  q24h - can consider switching to PO Doxy when no longer NPO - scheduled duonebs q4h, q2h prn - continue home symbicort BID - monitor on tele - cardiology c/s, appreciate recs - low threshold to c/s CCM pending repeat ABG - trend lactic acid: 2.33>2.5>3.2 - obtained blood cultures (after levaquin) - will given Lasix 40mg  IV daily while NPO- consider more diuresis as needed  - risk start: A1c, lipids pending - NPO while on BiPAP  Altered Mental Status: Likely 2/2 respiratory acidosis with hypercapnia as above.Patient oriented to person, place, and possibly date? (june 15th), however cannot recall any events from yesterday.  - Avoid sedating medications - Monitor for improvement on BiPAP  Ischemic cardiomyopathy, CAD s/p CABG, CHF: Last Echo (3/16) with EF 25-30% and akinesis of apex, anterior septum and anterior wall.Has AICD in place. Appears mildly volume overloaded on exam.  - Dry weight appears to be 143-145lbs from outpt visits, last 08/2014: currently 159lbs - ACS r/o as above - resume home statin, plavix, metoprolol, aspirin when able to take PO (off BiPAP) - does not appear to be on an ACE-i - IV lasix 20mg  x1 on admitted - IV Lasix 40mg  IV while inpatient - daily weights, Is&Os - continue to monitor fluid status  Concerns for aspiration: Patient's daughter reports choking and coughing when attempting to eat for last few months - Currently NPO - SLP eval  in AM once off BiPaP   T2DM: Last A1c 10.5 (08/30/14). On SSI, 70/30 and Lantus at home per med rec. - monitor CBGs - sensitive SSI - holding home 70/30 - will decrease Lantus dose to 10 units qhs (home dose 20 units)  Tobacco abuse: Daughter reports he smokes at least 1 PPD - Nicotine patch 21mg  - tobacco cessation counseling after mental status  improves  L leg poorly healing wound, chronic venous stasis: Does not appear acutely infected. Seen at wound care clinic as OP per daughter. - Wound care c/s for management inpatient - elevate feet  FEN/GI: NPO while on BiPAP, SLIV Prophylaxis: SQ heparin  Disposition: continue in SDU  Subjective:  Patient cannot tell me the events from yesterday, just states "ok."  No SOB. No chest pain.  Objective: Temp:  [97.4 F (36.3 C)-97.9 F (36.6 C)] 97.5 F (36.4 C) (06/16 0359) Pulse Rate:  [77-93] 86 (06/16 0748) Resp:  [11-29] 16 (06/16 0748) BP: (98-126)/(44-82) 110/57 mmHg (06/16 0359) SpO2:  [89 %-100 %] 96 % (06/16 0742) FiO2 (%):  [40 %] 40 % (06/16 0407) Weight:  [158 lb 11.7 oz (72 kg)-159 lb 3.2 oz (72.213 kg)] 159 lb 3.2 oz (72.213 kg) (06/16 0359) Physical Exam: General: lying in bed, jerking of the upper extremities and torso intermittently Cardiovascular: Tachycardic. Regular rhythm. No m/r/g noted. 3+ ptting edema b/l, worse on the R. Respiratory: No increased WOB. Poor air movement b/l. Some crackles in the bases b/l. No wheezing or rhonchi noted. Currently on NRB, with O2 sats from 50s-90s, however not always the best waveform Abdomen: +BS, soft, ND/NT Extremities: 3+ pitting edema to the knees b/l. Chronic venous stasis changes to the LEs b/l. 4x2cm full thickness ulcer on left lower extremity with some yellow drainage, covered by foam dressing.  Laboratory:  Recent Labs Lab 12/14/14 2001 12/15/14 0505  WBC  --  4.7  HGB 16.3 10.9*  HCT 48.0 41.3  PLT  --  99*    Recent Labs Lab 12/14/14 1950 12/14/14 2001 12/15/14 0505  NA  --  138 138  K  --  5.2* 5.0  CL  --  95* 94*  CO2  --   --  32  BUN  --  45* 35*  CREATININE  --  1.50* 1.40*  CALCIUM  --   --  8.5*  PROT 6.9  --   --   BILITOT 1.2  --   --   ALKPHOS 56  --   --   ALT 10*  --   --   AST 19  --   --   GLUCOSE  --  199* 199*  Lactic acid: 2.33>2.5>3.2  Imaging/Diagnostic Tests: CXR:  Chronic bronchitic changes. No definite infiltrates. Possible small effusions.  CXR: Developing mild interstitial edema. Slight increase in bibasilar atelectasis. Probable small right pleural effusion.  Archie Patten, MD 12/15/2014, 8:33 AM PGY-1, [place] Intern pager: [phone], text pages welcome

## 2014-12-15 NOTE — Progress Notes (Signed)
UR COMPLETED  

## 2014-12-15 NOTE — Consult Note (Addendum)
WOC wound consult note Reason for Consult: Consult requested for left leg wound. Wound type: Full thickness stasis ulcer Measurement: 4X2X.2cm Wound bed: 25% red, 75% yellow Drainage (amount, consistency, odor) Small amt yellow drainage, no odor  Periwound: Intact skin surrounding Dressing procedure/placement/frequency: Foam dressing to protect and promote healing. Discussed plan of care with patient but he was short of breath and did not verbalize. Please re-consult if further assistance is needed.  Thank-you,  Julien Girt MSN, [place], [place], [place], [place]

## 2014-12-15 NOTE — Progress Notes (Signed)
SLP Cancellation Note  Patient Details Name: James Moon MRN: [id number] DOB: September 24, 1945   Cancelled treatment:       Reason Eval/Treat Not Completed: Patient not medically ready. On BiPAP   [name], Katherene Ponto 12/15/2014, 9:06 AM

## 2014-12-15 NOTE — Consult Note (Signed)
CARDIOLOGY CONSULT NOTE  Patient ID: James Moon, MRN: [id number], DOB/AGE: Nov 08, 1945 69 y.o. Admit date: 12/14/2014 Date of Consult: 12/15/2014  Primary Physician: Lamar Blinks, MD Primary Cardiologist: Dr Acie Fredrickson Referring Physician: Dr Mingo Amber  Chief Complaint: shortness of breath/confusion Reason for Consultation: CHF  HPI: 69 year old gentleman with severe O2 dependent COPD. He presents to the hospital with altered mental status and respiratory failure. Currently being treated for COPD exacerbation with superimposed congestive heart failure.  The patient is followed by Dr. Acie Fredrickson. He was last seen 12/01/2014. He was noted to be stable at that time. He is followed for biventricular heart failure with an LVEF of 25-30%. He is felt to have right-sided heart failure related to his severe obstructive lung disease. He continues to smoke cigarettes. He also has had chronic lower extremity edema with nonhealing ulceration of the left leg. He is followed in wound clinic.  The patient is currently on BiPAP. I am really not able to obtain any meaningful history from him. There is no family at bedside. History is obtained from chart review. There is no report of chest pain.  Medical History:  Past Medical History  Diagnosis Date  . Diabetes mellitus   . Colon cancer     s/p colectomy   . Hypertension   . CAD (coronary artery disease)     a. CABG in 1996. b. NSTEMI 06/2011: DES x 2 to SVG to 1st and 2nd OM and SVG to PDA. c. 09/2013: NSTEMI s/p DES to prox SVG-OM1, NCPTCA of mSVG-OM1.  . Ischemic cardiomyopathy Dec. 2012    a. EF 20 to 25% 2012. b. 30-35% echo 3/13. c. EF 40-45% by echo 09/2013 but then at time of NSTEMI was 20% by cath later that month.  . Tobacco abuse   . COPD (chronic obstructive pulmonary disease)   . Chronic systolic CHF (congestive heart failure)   . Chronic respiratory failure     a. On home O2 - due to COPD.  Marland Kitchen NSVT (nonsustained ventricular tachycardia)     . Hypotension   . AICD ([address] [place])   . Thrombocytopenia   . Pulmonary HTN   . Syncope     a. 03/2013 in setting of hypotension.  . Venous stasis ulcers of both lower extremities 08/2014      Surgical History:  Past Surgical History  Procedure Laterality Date  . Colon surgery    . Open heart surgery  01/1995  . Coronary stent placement  Dec. 2012    DES to SVG to 1st and 2nd OM and SVG to the PDA  . Coronary artery bypass graft  01/1995  . Coronary angioplasty    . Colon surgery      FOR HISTORY OF COLON CANCER  . Left heart catheterization with coronary angiogram N/A 06/10/2011    Procedure: LEFT HEART CATHETERIZATION WITH CORONARY ANGIOGRAM;  Surgeon: Minus Breeding, MD;  Location: [facility] CATH LAB;  Service: Cardiovascular;  Laterality: N/A;  . Graft(s) angiogram  06/10/2011    Procedure: GRAFT(S) Cyril Loosen;  Surgeon: Minus Breeding, MD;  Location: [facility] CATH LAB;  Service: Cardiovascular;;  . Percutaneous coronary stent intervention (pci-s) N/A 06/11/2011    Procedure: PERCUTANEOUS CORONARY STENT INTERVENTION (PCI-S);  Surgeon: Peter M Martinique, MD;  Location: [facility] CATH LAB;  Service: Cardiovascular;  Laterality: N/A;  . Implantable cardioverter defibrillator implant N/A 12/11/2011    Procedure: IMPLANTABLE CARDIOVERTER DEFIBRILLATOR IMPLANT;  Surgeon: Deboraha Sprang, MD;  Location: [facility] CATH LAB;  Service: Cardiovascular;  Laterality:  N/A;  . Left and right heart catheterization with coronary/graft angiogram N/A 10/20/2013    Procedure: LEFT AND RIGHT HEART CATHETERIZATION WITH Beatrix Fetters;  Surgeon: Troy Sine, MD;  Location: [facility] CATH LAB;  Service: Cardiovascular;  Laterality: N/A;     Home Meds: Prior to Admission medications   Medication Sig Start Date End Date Taking? Authorizing Provider  aspirin EC 81 MG tablet Take 81 mg by mouth daily.    Yes Historical Provider, MD  atorvastatin (LIPITOR) 40 MG tablet TAKE ONE TABLET BY MOUTH ONCE DAILY. 12/01/14  Yes Thayer Headings,  MD  clopidogrel (PLAVIX) 75 MG tablet Take 1 tablet (75 mg total) by mouth daily. 12/01/14  Yes Thayer Headings, MD  collagenase (SANTYL) ointment Apply topically daily. 09/03/14  Yes Hosie Poisson, MD  doxycycline (VIBRA-TABS) 100 MG tablet Take 1 tablet (100 mg total) by mouth every 12 (twelve) hours. 09/03/14  Yes Hosie Poisson, MD  furosemide (LASIX) 40 MG tablet Take 1 tablet (40 mg total) by mouth 2 (two) times daily. 12/01/14  Yes Thayer Headings, MD  insulin aspart (NOVOLOG) 100 UNIT/ML injection CBG 70 - 120: 0 units CBG 121 - 150: 2 units CBG 151 - 200: 3 units CBG 201 - 250: 5 units CBG 251 - 300: 8 units CBG 301 - 350: 11 units CBG 351 - 400: 15 units 09/03/14  Yes Hosie Poisson, MD  Insulin Glargine (LANTUS SOLOSTAR) 100 UNIT/ML Solostar Pen Inject 20 Units into the skin daily at 10 pm. Patient taking differently: Inject 20-24 Units into the skin every morning.  10/22/13  Yes Olam Idler, MD  insulin NPH-regular Human (NOVOLIN 70/30) (70-30) 100 UNIT/ML injection Inject 3 Units into the skin every 8 (eight) hours.   Yes Historical Provider, MD  ipratropium-albuterol (DUONEB) 0.5-2.5 (3) MG/3ML SOLN Take 3 mLs by nebulization every 6 (six) hours as needed. 09/03/14  Yes Hosie Poisson, MD  metoprolol succinate (TOPROL-XL) 25 MG 24 hr tablet Take 0.5 tablets (12.5 mg total) by mouth daily. 12/01/14  Yes Thayer Headings, MD  NEEDLE, DISP, 22 G (B-D DISP NEEDLE 22GX3/4") 22G X 3/4" MISC 1 Syringe by Does not apply route daily. 10/22/13  Yes Olam Idler, MD  nitroGLYCERIN (NITROSTAT) 0.4 MG SL tablet Place 1 tablet (0.4 mg total) under the tongue every 5 (five) minutes as needed for chest pain. For chest pain 12/01/14  Yes Thayer Headings, MD  RELION INSULIN SYR 0.3ML/31G 31G X 5/16" 0.3 ML MISC USE THREE TIMES DAILY 10/07/14  Yes Darreld Mclean, MD  SYMBICORT 160-4.5 MCG/ACT inhaler INHALE TWO PUFFS BY MOUTH TWICE DAILY 11/24/14  Yes Darreld Mclean, MD    Inpatient Medications:  . antiseptic oral  rinse  7 mL Mouth Rinse q12n4p  . aspirin EC  81 mg Oral Daily  . atorvastatin  40 mg Oral q1800  . budesonide-formoterol  2 puff Inhalation BID  . chlorhexidine  15 mL Mouth Rinse BID  . Chlorhexidine Gluconate Cloth  6 each Topical Q0600  . clopidogrel  75 mg Oral Daily  . docusate sodium  100 mg Oral BID  . furosemide  40 mg Intravenous Daily  . heparin  5,000 Units Subcutaneous 3 times per day  . insulin aspart  0-5 Units Subcutaneous QHS  . insulin aspart  0-9 Units Subcutaneous TID WC  . insulin glargine  10 Units Subcutaneous Q2200  . ipratropium-albuterol  3 mL Nebulization Q4H  . levofloxacin (LEVAQUIN) IV  750 mg  Intravenous QHS  . metoprolol succinate  12.5 mg Oral Daily  . mupirocin ointment  1 application Nasal BID  . nicotine  21 mg Transdermal Daily  . nicotine  21 mg Transdermal Once  . predniSONE  50 mg Oral Q breakfast  . sodium chloride  3 mL Intravenous Q12H  . sodium chloride  3 mL Intravenous Q12H      Allergies: No Known Allergies  History   Social History  . Marital Status: Widowed    Spouse Name: N/A  . Number of Children: N/A  . Years of Education: N/A   Occupational History  . Not on file.   Social History Main Topics  . Smoking status: Current Every Day Smoker -- 0.50 packs/day for 50 years    Types: Cigarettes    Start date: 02/26/2012  . Smokeless tobacco: Never Used     Comment: he has tried to quit many times.  . Alcohol Use: No  . Drug Use: No  . Sexual Activity: Not on file   Other Topics Concern  . Not on file   Social History Narrative   Lives in [place], has 1 child.  He is widowed .  He works at [employer].     Family History  Problem Relation Age of Onset  . Heart attack Father   . Diabetes Father   . Breast cancer Mother      Review of Systems: Unable to obtain secondary to patient's mental status and respiratory situation (currently on BiPAP)  Physical Exam: Blood pressure 108/58, pulse 97, temperature 98.2 F  (36.8 C), temperature source Axillary, resp. rate 20, height 5\' 11"  (1.803 m), weight 159 lb 3.2 oz (72.213 kg), SpO2 100 %. Pt is alert in no distress on BiPAP HEENT: normal Neck: JVP elevated to the angle of the jaw. Carotid upstrokes normal  Lungs: Very poor air movement bilaterally CV: The Apex is nonpalpable. The heart is regular rate and rhythm with distant heart sounds Abd: soft, NT, +BS Back: no CVA tenderness Ext: There is diffuse 2+ bilateral lower extremity edema with chronic stasis changes       Skin: warm and dry with diffuse redness/scaling of both lower legs Neuro: CNII-XII intact             Strength intact = bilaterally              Periods of involuntary arm jerking noted   Labs:  Recent Labs  12/15/14 0012 12/15/14 0505  TROPONINI 0.08* 0.08*   Lab Results  Component Value Date   WBC 4.7 12/15/2014   HGB 10.9* 12/15/2014   HCT 41.3 12/15/2014   MCV 88.2 12/15/2014   PLT 99* 12/15/2014    Recent Labs Lab 12/14/14 1950  12/15/14 0505  NA  --   < > 138  K  --   < > 5.0  CL  --   < > 94*  CO2  --   --  32  BUN  --   < > 35*  CREATININE  --   < > 1.40*  CALCIUM  --   --  8.5*  PROT 6.9  --   --   BILITOT 1.2  --   --   ALKPHOS 56  --   --   ALT 10*  --   --   AST 19  --   --   GLUCOSE  --   < > 199*  < > = values in this interval not  displayed. Lab Results  Component Value Date   CHOL 89 12/15/2014   HDL 42 12/15/2014   LDLCALC 35 12/15/2014   TRIG 60 12/15/2014   Lab Results  Component Value Date   DDIMER 1.44* 10/15/2013    Radiology/Studies:  Dg Chest Port 1 View  12/15/2014   CLINICAL DATA:  Respiratory failure, acute on chronic.  EXAM: PORTABLE CHEST - 1 VIEW  COMPARISON:  1 day prior  FINDINGS: Pacer/AICD with tip at right ventricle. Patient rotated left. Prior median sternotomy. Midline trachea. Mild cardiomegaly. Trace right pleural fluid. No pneumothorax. Skin fold over the left hemi thorax laterally. New or increased pulmonary  interstitial prominence and indistinctness. Patchy bibasilar airspace disease.  IMPRESSION: Developing mild interstitial edema.  Slight increase in bibasilar atelectasis.  Probable small right pleural effusion.   Electronically Signed   By: Abigail Miyamoto M.D.   On: 12/15/2014 09:10   Dg Chest Portable 1 View  12/14/2014   CLINICAL DATA:  Shortness of breath today.  EXAM: PORTABLE CHEST - 1 VIEW  COMPARISON:  09/02/2014  FINDINGS: The heart is borderline enlarged but stable. Stable tortuosity and calcification of the thoracic aorta. Stable surgical changes from bypass surgery. Coronary artery stents are noted. The right ventricular pacer wire is stable. The lungs are clear. Chronic bronchitic changes but no infiltrates or edema. Possible small effusions.  IMPRESSION: Chronic bronchitic changes.  No definite infiltrates.  Possible small effusions.   Electronically Signed   By: Marijo Sanes M.D.   On: 12/14/2014 20:20    EKG: NSR with frequent PAC's, 84 bpm, IVCD, ST-T changes consider inferolateral ischemia  Cardiac Studies: 2D Echo 08/31/2014: Study Conclusions  - Left ventricle: Akinesis of apex, anterior septum, and anterior wall. No definite clot seen. The cavity size was normal. Wall thickness was increased in a pattern of mild LVH. Systolic function was severely reduced. The estimated ejection fraction was in the range of 25% to 30%. - Left atrium: The atrium was moderately dilated. - Right ventricle: The cavity size was mildly dilated. Pacer wire or catheter noted in right ventricle. Systolic function was mildly reduced. - Pulmonary arteries: PA peak pressure: 39 mm Hg (S).  ASSESSMENT AND PLAN:  1. Acute on Chronic Systolic and Diastolic Heart Failure, NYHA functional Class 3/4 2. Severe COPD with acute on chronic respiratory failure 3. Lactic acidosis 4. Ongoing tobacco abuse 5. Mildly elevated troponin without evidence for ACS 6. Known CAD s/p CABG and PCI  This  gentleman is critically-ill with very marginal respiratory status and pCO2 of 80 despite BiPap. PCCM is going to evaluate him, but hopefully we can continue with BiPap and avoid intubation in this patient who has very marginal respiratory status even at baseline. He is volume overloaded and I would recommend IV diuresis with careful monitoring of his hemodynamics and renal function over the next 24 hours. Would change meds to IV as I doubt he will tolerate discontinuation of BiPap for even short periods of time. Mildly elevated troponin is indicative of his critical illness and respiratory distress rather then acute coronary syndrome. Would DC metoprolol while he is in acute heart failure to allow for adequate cardiac output and BP while we diurese him. Defer management of other problems to primary team and CCM. This gentleman has limited options in my opinion and unfortunately he has continued to smoke despite end-stage cardiopulmonary problems.  SignedSherren Mocha MD, [facility] 12/15/2014, 10:49 AM

## 2014-12-15 NOTE — Consult Note (Signed)
PULMONARY / CRITICAL CARE MEDICINE   Name: James Moon MRN: [id number] DOB: [birth year]    ADMISSION DATE:  12/14/2014 CONSULTATION DATE:  12/15/14   REFERRING MD :  Dr. Mingo Amber / [facility]PTS   CHIEF COMPLAINT:  Acute on Chronic Respiratory Failure   INITIAL PRESENTATION: 69 y/o M, smoker, admitted on 6/15 with acute on chronic respiratory failure in the setting of suspected CHF exacerbation and confusion.  Found to have hypercarbic respiratory failure and placed on bipap support.  PCCM consulted for evaluation 6/16.    STUDIES:  6/16  CXR >> chronic bronchitic changes, small bilateral effusions  SIGNIFICANT EVENTS: 6/15  Admit with SOB, acute on chronic resp fx, confusion/lethargy, LE swelling.   HISTORY OF PRESENT ILLNESS: 69 y/o M, smoker, with PMH of DM, HTN, CAD s/p CABG (1996), ICM with EF 20-30% / sCHF, NSVT, AICD, pulmonary hypertension (PA peak 39 08/31/14), O2 dependent COPD and venous stasis of BLE's with non-healing ulcerations (followed by the wound clinic) who presented to [facility] on 6/15 via EMS with progressive shortness of breath, confusion and LE swelling.    The patient was reportedly in his usual state of health when seen by family on 6/12. He apparently was able to go to work on 6/13.  The daughter went to visit him on his birthday (day of admit) and she found him to be confused.  He apparently was falling asleep during conversation.  At baseline, he only uses oxygen when he is at home intermittently and when she found him the nasal cannula was pushed to the side of his face.  There have also been concerns of coughing / choking while eating.  In the ER, he was treated with IV steroids, nebulized bronchodilators and fluid bolus.  Initial labs were notable for pH 7.155, pCO2 100, pO2 319, HCO3 34, troponin 0.06, & lactic acid 2.33.  EKG with LBBB (? New) and CXR with small pleural effusions.    He was admitted per FPTS for acute on chronic respiratory failure with  concern for CHF & +/- COPD exacerbation, lactic acidosis, and mildly elevated troponin.  He was treated with gentle fluids and lasix was held.  Cardiology was consulted.  The patient was placed on intermittent bipap with improvement in mental status.  PCCM consulted on 6/16 for ongoing hypercarbic respiratory failure. Currently, the patient is on bipap.  Information obtained from previous medical documentation and staff.    PAST MEDICAL HISTORY :   has a past medical history of Diabetes mellitus; Colon cancer; Hypertension; CAD (coronary artery disease); Ischemic cardiomyopathy (Dec. 2012); Tobacco abuse; COPD (chronic obstructive pulmonary disease); Chronic systolic CHF (congestive heart failure); Chronic respiratory failure; NSVT (nonsustained ventricular tachycardia); Hypotension; AICD ([address] [place]); Thrombocytopenia; Pulmonary HTN; Syncope; and Venous stasis ulcers of both lower extremities (08/2014).  has past surgical history that includes Colon surgery; open heart surgery (01/1995); Coronary stent placement (Dec. 2012); Coronary artery bypass graft (01/1995); Coronary angioplasty; Colon surgery; left heart catheterization with coronary angiogram (N/A, 06/10/2011); graft(s) angiogram (06/10/2011); percutaneous coronary stent intervention (pci-s) (N/A, 06/11/2011); implantable cardioverter defibrillator implant (N/A, 12/11/2011); and left and right heart catheterization with coronary/graft angiogram (N/A, 10/20/2013).   Prior to Admission medications   Medication Sig Start Date End Date Taking? Authorizing Provider  aspirin EC 81 MG tablet Take 81 mg by mouth daily.    Yes Historical Provider, MD  atorvastatin (LIPITOR) 40 MG tablet TAKE ONE TABLET BY MOUTH ONCE DAILY. 12/01/14  Yes Thayer Headings, MD  clopidogrel (PLAVIX) 75 MG tablet Take 1 tablet (75 mg total) by mouth daily. 12/01/14  Yes Thayer Headings, MD  collagenase (SANTYL) ointment Apply topically daily. 09/03/14  Yes Hosie Poisson, MD  doxycycline  (VIBRA-TABS) 100 MG tablet Take 1 tablet (100 mg total) by mouth every 12 (twelve) hours. 09/03/14  Yes Hosie Poisson, MD  furosemide (LASIX) 40 MG tablet Take 1 tablet (40 mg total) by mouth 2 (two) times daily. 12/01/14  Yes Thayer Headings, MD  insulin aspart (NOVOLOG) 100 UNIT/ML injection CBG 70 - 120: 0 units CBG 121 - 150: 2 units CBG 151 - 200: 3 units CBG 201 - 250: 5 units CBG 251 - 300: 8 units CBG 301 - 350: 11 units CBG 351 - 400: 15 units 09/03/14  Yes Hosie Poisson, MD  Insulin Glargine (LANTUS SOLOSTAR) 100 UNIT/ML Solostar Pen Inject 20 Units into the skin daily at 10 pm. Patient taking differently: Inject 20-24 Units into the skin every morning.  10/22/13  Yes Olam Idler, MD  insulin NPH-regular Human (NOVOLIN 70/30) (70-30) 100 UNIT/ML injection Inject 3 Units into the skin every 8 (eight) hours.   Yes Historical Provider, MD  ipratropium-albuterol (DUONEB) 0.5-2.5 (3) MG/3ML SOLN Take 3 mLs by nebulization every 6 (six) hours as needed. 09/03/14  Yes Hosie Poisson, MD  metoprolol succinate (TOPROL-XL) 25 MG 24 hr tablet Take 0.5 tablets (12.5 mg total) by mouth daily. 12/01/14  Yes Thayer Headings, MD  NEEDLE, DISP, 22 G (B-D DISP NEEDLE 22GX3/4") 22G X 3/4" MISC 1 Syringe by Does not apply route daily. 10/22/13  Yes Olam Idler, MD  nitroGLYCERIN (NITROSTAT) 0.4 MG SL tablet Place 1 tablet (0.4 mg total) under the tongue every 5 (five) minutes as needed for chest pain. For chest pain 12/01/14  Yes Thayer Headings, MD  RELION INSULIN SYR 0.3ML/31G 31G X 5/16" 0.3 ML MISC USE THREE TIMES DAILY 10/07/14  Yes Darreld Mclean, MD  SYMBICORT 160-4.5 MCG/ACT inhaler INHALE TWO PUFFS BY MOUTH TWICE DAILY 11/24/14  Yes Darreld Mclean, MD   No Known Allergies  FAMILY HISTORY:  indicated that his mother is deceased. He indicated that his father is deceased.    SOCIAL HISTORY:  reports that he has been smoking Cigarettes.  He started smoking about 2 years ago. He has a 25 pack-year smoking  history. He has never used smokeless tobacco. He reports that he does not drink alcohol or use illicit drugs.  REVIEW OF SYSTEMS:  Unable to complete as patient is on BiPAP.   SUBJECTIVE:   VITAL SIGNS: Temp:  [97.4 F (36.3 C)-98.2 F (36.8 C)] 98 F (36.7 C) (06/16 1145) Pulse Rate:  [76-131] 78 (06/16 1145) Resp:  [11-29] 21 (06/16 1145) BP: (98-126)/(44-82) 113/68 mmHg (06/16 1145) SpO2:  [89 %-100 %] 99 % (06/16 1145) FiO2 (%):  [40 %] 40 % (06/16 1145) Weight:  [158 lb 11.7 oz (72 kg)-159 lb 3.2 oz (72.213 kg)] 159 lb 3.2 oz (72.213 kg) (06/16 0359)   HEMODYNAMICS:     VENTILATOR SETTINGS: Vent Mode:  [-] BIPAP FiO2 (%):  [40 %] 40 % Set Rate:  [15 bmp] 15 bmp   INTAKE / OUTPUT:  Intake/Output Summary (Last 24 hours) at 12/15/14 1259 Last data filed at 12/15/14 0900  Gross per 24 hour  Intake 740.67 ml  Output    150 ml  Net 590.67 ml    PHYSICAL EXAMINATION: General:  Chronically ill adult male in NAD Neuro:  Awakens to voice, responds appropriately, MAE, flapping noted HEENT:  MM pink/moist, JVD+ Cardiovascular:  s1s2 rrr, distant tones  Lungs:  resp's even/non-labored, lungs bilaterally diminished  Abdomen:  NTND, bsx4 active  Musculoskeletal:  No acute deformities  Skin:  Thin skin, scattered bruises, BLE's with chronic venous stasis, odor, LLE venous wound  LABS:  CBC  Recent Labs Lab 12/14/14 2001 12/15/14 0505  WBC  --  4.7  HGB 16.3 10.9*  HCT 48.0 41.3  PLT  --  99*   Coag's No results for input(s): APTT, INR in the last 168 hours.   BMET  Recent Labs Lab 12/14/14 2001 12/15/14 0505  NA 138 138  K 5.2* 5.0  CL 95* 94*  CO2  --  32  BUN 45* 35*  CREATININE 1.50* 1.40*  GLUCOSE 199* 199*   Electrolytes  Recent Labs Lab 12/15/14 0505  CALCIUM 8.5*   Sepsis Markers  Recent Labs Lab 12/15/14 0012 12/15/14 0245 12/15/14 1010  LATICACIDVEN 2.5* 3.2* 2.0   ABG  Recent Labs Lab 12/14/14 2159 12/15/14 0425  12/15/14 1026  PHART 7.242* 7.244* 7.282*  PCO2ART 82.1* 84.9* 79.7*  PO2ART 88.9 78.1* 72.0*   Liver Enzymes  Recent Labs Lab 12/14/14 1950  AST 19  ALT 10*  ALKPHOS 56  BILITOT 1.2  ALBUMIN 3.3*   Cardiac Enzymes  Recent Labs Lab 12/15/14 0012 12/15/14 0505 12/15/14 1010  TROPONINI 0.08* 0.08* 0.09*   Glucose  Recent Labs Lab 12/14/14 2359 12/15/14 0755 12/15/14 1148  GLUCAP 326* 228* 187*    Imaging Dg Chest Port 1 View  12/15/2014   CLINICAL DATA:  Respiratory failure, acute on chronic.  EXAM: PORTABLE CHEST - 1 VIEW  COMPARISON:  1 day prior  FINDINGS: Pacer/AICD with tip at right ventricle. Patient rotated left. Prior median sternotomy. Midline trachea. Mild cardiomegaly. Trace right pleural fluid. No pneumothorax. Skin fold over the left hemi thorax laterally. New or increased pulmonary interstitial prominence and indistinctness. Patchy bibasilar airspace disease.  IMPRESSION: Developing mild interstitial edema.  Slight increase in bibasilar atelectasis.  Probable small right pleural effusion.   Electronically Signed   By: Abigail Miyamoto M.D.   On: 12/15/2014 09:10   Dg Chest Portable 1 View  12/14/2014   CLINICAL DATA:  Shortness of breath today.  EXAM: PORTABLE CHEST - 1 VIEW  COMPARISON:  09/02/2014  FINDINGS: The heart is borderline enlarged but stable. Stable tortuosity and calcification of the thoracic aorta. Stable surgical changes from bypass surgery. Coronary artery stents are noted. The right ventricular pacer wire is stable. The lungs are clear. Chronic bronchitic changes but no infiltrates or edema. Possible small effusions.  IMPRESSION: Chronic bronchitic changes.  No definite infiltrates.  Possible small effusions.   Electronically Signed   By: Marijo Sanes M.D.   On: 12/14/2014 20:20     ASSESSMENT / PLAN:  PULMONARY OETT A: Acute Hypercarbic on Chronic Respiratory Failure Severe O2 Dependent COPD Small Pleural Effusions  Tobacco  Abuse Pulmonary edema  P:   Intermittent BiPAP support for increased WOB PRN CXR  Assess ABG Would not recommend intubation for this patient with comorbids.  Very concerned he would not be able to come off MV.  Change symbicort to budesonide/brovana  Lasix per Cardiology  Prednisone 50 mg QD  CARDIOVASCULAR CVL A:  Acute on Chronic Systolic / Diastolic Heart Failure Mild Troponin Leak  CAD s/p CABG P:  SDU monitoring  Cardiology consulted, appreciate input  Trend troponin, EKG Daily weights  RENAL A:   Mild Hyperkalemia  AKI  P:   Monitor BMP / UOP  Replace electrolytes as indicated   GASTROINTESTINAL A:   Protein Calorie Malnutrition - mild, in setting of chronic disease Possible Dysphagia  P:   NPO while on bipap  Heart healthy diet as tolerated otherwise  SLP   HEMATOLOGIC A:   Anemia  Thrombocytopenia  P:  Trend CBC Per Primary MD  INFECTIOUS A:   AECOPD P:   BCx2 6/16 >>   Levaquin, start date 6/16, day 2/5  Adjust levaquin to 500 mg  ENDOCRINE A:   DM P:   SSI   NEUROLOGIC A:   Metabolic Encephalopathy - in setting of hypercarbia  P:  Minimize sedating medications  Serial neuro exams   FAMILY  - Updates:   - Inter-disciplinary family meet or Palliative Care meeting due by:  Recommend considering early palliative care given pulmonary history.    Noe Gens, NP-C [facility] Pgr: [phone] or [phone] 12/15/2014, 12:59 PM  Attending note:  Patient with essentially end stage COPD at this point presenting with acute on chronic hypercarbic respiratory failure.  The patient has a multitude of comorbidities that makes intubaion very high risk in him.  Crackles on exam.  CXR I reviewed showed interstitial edema as well.  Recommend continuation of BiPAP for WOB.  Aggressive diureses as ordered.  I attempted to contact family on number on the epic system but went straight to voice mail.  Would recommend  involvement of the palliative care service.  The patient is critically ill with multiple organ systems failure and requires high complexity decision making for assessment and support, frequent evaluation and titration of therapies, application of advanced monitoring technologies and extensive interpretation of multiple databases.   Critical Care Time devoted to patient care services described in this note is  35  Minutes. This time reflects time of care of this signee Dr Jennet Maduro. This critical care time does not reflect procedure time, or teaching time or supervisory time of PA/NP/Med student/Med Resident etc but could involve care discussion time.  Rush Farmer, M.D. [facility]. Pager: [phone]. After hours pager: [phone].

## 2014-12-15 NOTE — Progress Notes (Signed)
CRITICAL VALUE ALERT  Critical value received:  Lactic acid 3.2  Date of notification:  12/15/2014  Time of notification:  0510  Critical value read back:Yes.    Nurse who received alert:  Dorene Grebe, RN  MD notified (1st page):  Family Medicine Teaching Service pager  Time of first page:  [phone]  MD notified (2nd page):  Time of second page:  Responding MD:  Dr. Brita Romp  Time MD responded:  [phone], no new orders received as of 0527. Will continue to monitor.

## 2014-12-15 NOTE — Progress Notes (Signed)
Pt refusing Lantus, heparin, and Bactroban at this time. Pt appears agitated but is able to answer who he is, where he is, and the year. Will continue to monitor.

## 2014-12-15 NOTE — Progress Notes (Signed)
Pt having paranoid thoughts, states someone switched his arm-band. Will continue to monitor.

## 2014-12-15 NOTE — Progress Notes (Signed)
Respiratory therapist called this RN with panic ABG results; pH 7.24, pO2 78.1, pCO2 84.9, Bicarb 35.4.  Paged MD at [phone], Dr. Brita Romp called back, results read to her; no new orders received. Pt to stay on BiPAP. Will continue to monitor.

## 2014-12-16 ENCOUNTER — Encounter (HOSPITAL_COMMUNITY): Payer: Self-pay

## 2014-12-16 ENCOUNTER — Inpatient Hospital Stay (HOSPITAL_COMMUNITY): Payer: PPO

## 2014-12-16 DIAGNOSIS — J9600 Acute respiratory failure, unspecified whether with hypoxia or hypercapnia: Secondary | ICD-10-CM | POA: Insufficient documentation

## 2014-12-16 DIAGNOSIS — Z515 Encounter for palliative care: Secondary | ICD-10-CM

## 2014-12-16 DIAGNOSIS — R4182 Altered mental status, unspecified: Secondary | ICD-10-CM | POA: Insufficient documentation

## 2014-12-16 LAB — BLOOD GAS, ARTERIAL
Acid-Base Excess: 14.3 mmol/L — ABNORMAL HIGH (ref 0.0–2.0)
Bicarbonate: 39.8 mEq/L — ABNORMAL HIGH (ref 20.0–24.0)
Delivery systems: POSITIVE
Drawn by: 398981
EXPIRATORY PAP: 8
FIO2: 0.4 %
Inspiratory PAP: 16
LHR: 15 {breaths}/min
O2 Saturation: 96.8 %
PCO2 ART: 65.3 mmHg — AB (ref 35.0–45.0)
PO2 ART: 90 mmHg (ref 80.0–100.0)
Patient temperature: 98.6
TCO2: 41.8 mmol/L (ref 0–100)
pH, Arterial: 7.402 (ref 7.350–7.450)

## 2014-12-16 LAB — GLUCOSE, CAPILLARY
GLUCOSE-CAPILLARY: 271 mg/dL — AB (ref 65–99)
GLUCOSE-CAPILLARY: 375 mg/dL — AB (ref 65–99)
Glucose-Capillary: 185 mg/dL — ABNORMAL HIGH (ref 65–99)
Glucose-Capillary: 194 mg/dL — ABNORMAL HIGH (ref 65–99)

## 2014-12-16 LAB — BASIC METABOLIC PANEL
ANION GAP: 10 (ref 5–15)
BUN: 41 mg/dL — ABNORMAL HIGH (ref 6–20)
CO2: 39 mmol/L — ABNORMAL HIGH (ref 22–32)
Calcium: 9.3 mg/dL (ref 8.9–10.3)
Chloride: 92 mmol/L — ABNORMAL LOW (ref 101–111)
Creatinine, Ser: 1.42 mg/dL — ABNORMAL HIGH (ref 0.61–1.24)
GFR calc Af Amer: 57 mL/min — ABNORMAL LOW (ref >60)
GFR, EST NON AFRICAN AMERICAN: 49 mL/min — AB (ref >60)
Glucose, Bld: 205 mg/dL — ABNORMAL HIGH (ref 65–99)
POTASSIUM: 4.7 mmol/L (ref 3.5–5.1)
SODIUM: 141 mmol/L (ref 135–145)

## 2014-12-16 LAB — CBC
HCT: 37 % — ABNORMAL LOW (ref 39.0–52.0)
HEMOGLOBIN: 10 g/dL — AB (ref 13.0–17.0)
MCH: 23.1 pg — AB (ref 26.0–34.0)
MCHC: 27 g/dL — ABNORMAL LOW (ref 30.0–36.0)
MCV: 85.5 fL (ref 78.0–100.0)
Platelets: 101 10*3/uL — ABNORMAL LOW (ref 150–400)
RBC: 4.33 MIL/uL (ref 4.22–5.81)
RDW: 19.8 % — ABNORMAL HIGH (ref 11.5–15.5)
WBC: 6.8 10*3/uL (ref 4.0–10.5)

## 2014-12-16 LAB — HEMOGLOBIN A1C
Hgb A1c MFr Bld: 7.1 % — ABNORMAL HIGH (ref 4.8–5.6)
Mean Plasma Glucose: 157 mg/dL

## 2014-12-16 MED ORDER — CETYLPYRIDINIUM CHLORIDE 0.05 % MT LIQD
7.0000 mL | Freq: Two times a day (BID) | OROMUCOSAL | Status: DC
  Administered 2014-12-16 – 2014-12-22 (×9): 7 mL via OROMUCOSAL

## 2014-12-16 NOTE — Progress Notes (Addendum)
Upon further assessment, Pt pupil greater on L; otherwise equal grips, smile, fairly intact; called DR, DR was already aware, CT ordered. Will continue to monitor.

## 2014-12-16 NOTE — Progress Notes (Signed)
Patient Name: James Moon Date of Encounter: 12/16/2014  Active Problems:   Respiratory distress   Respiratory failure, acute-on-chronic   Pressure ulcer   Acute respiratory failure with hypoxia and hypercarbia   Acute exacerbation of CHF (congestive heart failure)   Elevated troponin   Somnolence   COPD exacerbation   Altered mental state   Palliative care encounter   Length of Stay: 2  SUBJECTIVE  Substantial improvement. Lying almost fully supine. Off BiPAP, face mask O2, sat 93%. Relaxed, slow breathing. ABG with improved hypercapnia - probably baseline now (pH 7.40, pcO2 65). Excellent UO, -ve 2.5L since admission. Still has substantial calf edema.  CURRENT MEDS . antiseptic oral rinse  7 mL Mouth Rinse q12n4p  . arformoterol  15 mcg Nebulization BID  . aspirin EC  81 mg Oral Daily  . budesonide  0.5 mg Nebulization BID  . chlorhexidine  15 mL Mouth Rinse BID  . Chlorhexidine Gluconate Cloth  6 each Topical Q0600  . clopidogrel  75 mg Oral Daily  . docusate sodium  100 mg Oral BID  . furosemide  80 mg Intravenous BID  . insulin aspart  0-5 Units Subcutaneous QHS  . insulin aspart  0-9 Units Subcutaneous TID WC  . insulin glargine  10 Units Subcutaneous Q2200  . ipratropium-albuterol  3 mL Nebulization Q4H  . levofloxacin (LEVAQUIN) IV  500 mg Intravenous QHS  . mupirocin ointment  1 application Nasal BID  . nicotine  21 mg Transdermal Daily  . predniSONE  50 mg Oral Q breakfast  . sodium chloride  3 mL Intravenous Q12H    OBJECTIVE   Intake/Output Summary (Last 24 hours) at 12/16/14 1509 Last data filed at 12/16/14 1230  Gross per 24 hour  Intake    150 ml  Output   3300 ml  Net  -3150 ml   Filed Weights   12/14/14 2235 12/15/14 0359 12/16/14 0458  Weight: 158 lb 11.7 oz (72 kg) 159 lb 3.2 oz (72.213 kg) 149 lb 14.6 oz (68 kg)    PHYSICAL EXAM Filed Vitals:   12/16/14 0500 12/16/14 0730 12/16/14 0855 12/16/14 1215  BP: 116/51 123/69  116/53   Pulse: 93     Temp:  98 F (36.7 C)  97.8 F (36.6 C)  TempSrc:  Oral  Oral  Resp: 15     Height:      Weight:      SpO2: 98% 99% 100% 100%   General: Alert, oriented x3, no distress. Appears chronically ill with temporal and interosseous muscle wasting Head: no evidence of trauma, mildly asymmetrical pupils L>R, both reactive, EOMI, no exophtalmos or lid lag, no myxedema, no xanthelasma; normal ears, nose and oropharynx Neck: 3-4 cm elevation in jugular venous pulsations and no hepatojugular reflux; brisk carotid pulses without delay and no carotid bruits Chest: diminished breath sounds throughout, no wheezing or rales, no signs of consolidation by percussion or palpation, normal fremitus, symmetrical and full respiratory excursions Cardiovascular: normal position and quality of the apical impulse, regular rhythm, normal first and second heart sounds, no rubs or gallops, 2/6 LLSB holosystolic murmur Abdomen: no tenderness or distention, no masses by palpation, no abnormal pulsatility or arterial bruits, normal bowel sounds, no hepatosplenomegaly Extremities: no clubbing, cyanosis; 2-3+ hard pitting leg edema to knee bilaterally, symmetrical edema; left shin venous stasis ulcer; 2+ radial, ulnar and brachial pulses bilaterally; 2+ right femoral, posterior tibial and dorsalis pedis pulses; 2+ left femoral, posterior tibial and dorsalis pedis pulses; no  subclavian or femoral bruits Neurological: grossly nonfocal  LABS  CBC  Recent Labs  12/15/14 0505 12/16/14 0211  WBC 4.7 6.8  HGB 10.9* 10.0*  HCT 41.3 37.0*  MCV 88.2 85.5  PLT 99* 867*   Basic Metabolic Panel  Recent Labs  12/15/14 0505 12/16/14 0211  NA 138 141  K 5.0 4.7  CL 94* 92*  CO2 32 39*  GLUCOSE 199* 205*  BUN 35* 41*  CREATININE 1.40* 1.42*  CALCIUM 8.5* 9.3   Liver Function Tests  Recent Labs  12/14/14 1950  AST 19  ALT 10*  ALKPHOS 56  BILITOT 1.2  PROT 6.9  ALBUMIN 3.3*   No results for  input(s): LIPASE, AMYLASE in the last 72 hours. Cardiac Enzymes  Recent Labs  12/15/14 0012 12/15/14 0505 12/15/14 1010  TROPONINI 0.08* 0.08* 0.09*   BNP Invalid input(s): POCBNP D-Dimer No results for input(s): DDIMER in the last 72 hours. Hemoglobin A1C  Recent Labs  12/15/14 0505  HGBA1C 7.1*   Fasting Lipid Panel  Recent Labs  12/15/14 0245  CHOL 89  HDL 42  LDLCALC 35  TRIG 60  CHOLHDL 2.1   Thyroid Function Tests No results for input(s): TSH, T4TOTAL, T3FREE, THYROIDAB in the last 72 hours.  Invalid input(s): [place]  Radiology Studies Imaging results have been reviewed and Ct Head Wo Contrast  12/16/2014   CLINICAL DATA:  Confusion, fall, hypoxia, history colon cancer, CHF, diabetes mellitus, hypertension, coronary artery disease, ischemic cardiomyopathy, smoker, COPD  EXAM: CT HEAD WITHOUT CONTRAST  TECHNIQUE: Contiguous axial images were obtained from the base of the skull through the vertex without intravenous contrast.  COMPARISON:  04/21/2013  FINDINGS: Generalized atrophy.  Normal ventricular morphology.  No midline shift or mass effect.  Small vessel chronic ischemic changes of deep cerebral white matter.  No intracranial hemorrhage, mass lesion, or acute infarction.  Visualized paranasal sinuses and mastoid air cells clear.  Bones unremarkable.  Significant atherosclerotic calcifications of the internal carotid and vertebral arteries at skullbase.  IMPRESSION: Atrophy with small vessel chronic ischemic changes of deep cerebral white matter.  No acute intracranial abnormalities.   Electronically Signed   By: Lavonia Dana M.D.   On: 12/16/2014 13:46   Dg Chest Port 1 View  12/16/2014   CLINICAL DATA:  Acute on chronic respiratory failure, pulmonary edema with CHF and reduced LEFT ventricular function, history hypertension, ischemic cardiomyopathy, diabetes mellitus, colon cancer, smoking, COPD, pulmonary hypertension  EXAM: PORTABLE CHEST - 1 VIEW  COMPARISON:   Portable exam 1011 hours compared 12/15/2014  FINDINGS: LEFT subclavian AICD lead tip projects over RIGHT ventricle.  Borderline enlargement of cardiac silhouette post CABG.  Slight pulmonary vascular congestion.  Mild perihilar infiltrates likely pulmonary edema, little changed.  Small RIGHT pleural effusion.  No pneumothorax.  IMPRESSION: Mild persistent CHF.   Electronically Signed   By: Lavonia Dana M.D.   On: 12/16/2014 10:20   Dg Chest Port 1 View  12/15/2014   CLINICAL DATA:  Respiratory failure, acute on chronic.  EXAM: PORTABLE CHEST - 1 VIEW  COMPARISON:  1 day prior  FINDINGS: Pacer/AICD with tip at right ventricle. Patient rotated left. Prior median sternotomy. Midline trachea. Mild cardiomegaly. Trace right pleural fluid. No pneumothorax. Skin fold over the left hemi thorax laterally. New or increased pulmonary interstitial prominence and indistinctness. Patchy bibasilar airspace disease.  IMPRESSION: Developing mild interstitial edema.  Slight increase in bibasilar atelectasis.  Probable small right pleural effusion.   Electronically Signed  By: Abigail Miyamoto M.D.   On: 12/15/2014 09:10   Dg Chest Portable 1 View  12/14/2014   CLINICAL DATA:  Shortness of breath today.  EXAM: PORTABLE CHEST - 1 VIEW  COMPARISON:  09/02/2014  FINDINGS: The heart is borderline enlarged but stable. Stable tortuosity and calcification of the thoracic aorta. Stable surgical changes from bypass surgery. Coronary artery stents are noted. The right ventricular pacer wire is stable. The lungs are clear. Chronic bronchitic changes but no infiltrates or edema. Possible small effusions.  IMPRESSION: Chronic bronchitic changes.  No definite infiltrates.  Possible small effusions.   Electronically Signed   By: Marijo Sanes M.D.   On: 12/14/2014 20:20    TELE Sinus rhythm with PACs  ECG Sinus rhythm with PACs, nonspecific IVCD with LAD, diffuse ST-T changes  ASSESSMENT AND PLAN  Rapid turnaround suggests CHF due to  severe ischemic cardiomyopathy (EF25-30%) was a major part of his acute decompensation, but his ABG suggests he has severe chronic hypoventilation and is easy to "tip over the edge" Plateau pattern of troponin elevation does not support an acute coronary event and he does not have angina - sign of poor prognosis, but no plan for invasive coronary evaluation. He is dependent on only 2 conduits: stented left main to ramus and LIMA to LAD. If either ne were to fail, would expect even worse outcome. Smoking cessation is critical, but he does not appear motivated to do this. Still hypervolemic, continue diuretics.   Sanda Klein, MD, [facility] [phone] office [phone] pager 12/16/2014 3:09 PM

## 2014-12-16 NOTE — Progress Notes (Signed)
Pt accepted meds from charge RN; only refused Colace. Will continue to monitor.

## 2014-12-16 NOTE — Progress Notes (Signed)
FPTS Interim Progress Note  S: Went to evaluate patient approx [id number]. Previously discussed with RN patient seems to be tolerating BiPAP overall well, better after RT switched mask for better fit. Pt remains awake but seems mildly agitated without any aggressive behaviors. Seems to be tolerating BiPAP - Denies any new concerns, no CP, SOB.  O: BP 97/50 mmHg  Pulse 87  Temp(Src) 98.1 F (36.7 C) (Axillary)  Resp 23  Ht 5\' 11"  (1.803 m)  Wt 159 lb 3.2 oz (72.213 kg)  BMI 22.21 kg/m2  SpO2 99%   Gen - thin elderly chronically-ill appearing, seems comfortable, on BiPAP, NAD Heart - RRR, no murmurs heard Lungs - Mostly CTAB, diminished breath sounds b/l with reduced air movement, some scattered exp wheezes but not overt and difficult to auscultate mild bibasilar crackles (seems improved). 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 PMH known CAD s/p CABG, DM2, HTN, ICM EF 25-30% s/p AICD.  # Acute on Chronic Resp Failure, 2/2 AECOPD, acute mixed CHF exac - Improved, req BiPAP Last ABG  @ 1600 with pH 7.301 / pCO2 76.1 / pO2 92.6 / O2 sat 97.4 (improved from pH 7.282 and CO2 79.7) - Continue on BiPAP (remained throughout the day), maintaining O2 sat, tolerating, stable mental status - Continue Lasix 80mg  IV BID per Cards - Repeat ABG, CXR in AM - Followed by PCCM, Cardiology - Consulted Palliative Care - eval GOC / code status, as patient is not candidate for  intubation per PCCM, high risk for unable to come off vent given end-stage cardio/pulm disease. Currently remains full code.  Olin Hauser, DO 12/16/2014, 12:54 AM PGY-2, [place] [facility] pager [phone]

## 2014-12-16 NOTE — Progress Notes (Signed)
PULMONARY / CRITICAL CARE MEDICINE   Name: James Moon MRN: [id number] DOB: 1946-02-01    ADMISSION DATE:  12/14/2014 CONSULTATION DATE:  12/15/14   REFERRING MD :  Dr. Mingo Amber / FPTS   CHIEF COMPLAINT:  Acute on Chronic Respiratory Failure   INITIAL PRESENTATION: 69 y/o M, smoker, admitted on 6/15 with acute on chronic respiratory failure in the setting of suspected CHF exacerbation and confusion.  Found to have hypercarbic respiratory failure and placed on bipap support.  PCCM consulted for evaluation 6/16.    STUDIES:  6/16  CXR >> chronic bronchitic changes, small bilateral effusions  SIGNIFICANT EVENTS: 6/15  Admit with SOB, acute on chronic resp fx, confusion/lethargy, LE swelling. 6/16  Mild agitation overnight, resolved by am.   5/17  50% VM, mentation much improved    SUBJECTIVE:  Pt denies acute complaints.  Reports breathing improved but not great.  On 50% VM  VITAL SIGNS: Temp:  [97.4 F (36.3 C)-98.1 F (36.7 C)] 98 F (36.7 C) (06/17 0730) Pulse Rate:  [75-93] 93 (06/17 0500) Resp:  [14-23] 15 (06/17 0500) BP: (97-128)/(50-68) 116/51 mmHg (06/17 0500) SpO2:  [98 %-100 %] 100 % (06/17 0855) FiO2 (%):  [40 %-50 %] 50 % (06/17 0855) Weight:  [149 lb 14.6 oz (68 kg)] 149 lb 14.6 oz (68 kg) (06/17 0458)   VENTILATOR SETTINGS: Vent Mode:  [-] BIPAP FiO2 (%):  [40 %-50 %] 50 % Set Rate:  [15 bmp] 15 bmp   INTAKE / OUTPUT:  Intake/Output Summary (Last 24 hours) at 12/16/14 0956 Last data filed at 12/16/14 0700  Gross per 24 hour  Intake    100 ml  Output   2300 ml  Net  -2200 ml    PHYSICAL EXAMINATION: General:  Chronically ill adult male in NAD Neuro:  Awake, alert, oriented.  MAE.  L pupil 79mm, R 76mm (pt states is normal) HEENT:  MM pink/moist, JVD+ Cardiovascular:  s1s2 rrr, distant tones  Lungs:  resp's even/non-labored, lungs bilaterally diminished but improved air movement compared to 6/16  Abdomen:  NTND, bsx4 active  Musculoskeletal:  No  acute deformities  Skin:  Thin skin, scattered bruises, BLE's with chronic venous stasis, odor, LLE venous wound  LABS:  CBC  Recent Labs Lab 12/14/14 2001 12/15/14 0505 12/16/14 0211  WBC  --  4.7 6.8  HGB 16.3 10.9* 10.0*  HCT 48.0 41.3 37.0*  PLT  --  99* 101*   Coag's No results for input(s): APTT, INR in the last 168 hours.   BMET  Recent Labs Lab 12/14/14 2001 12/15/14 0505 12/16/14 0211  NA 138 138 141  K 5.2* 5.0 4.7  CL 95* 94* 92*  CO2  --  32 39*  BUN 45* 35* 41*  CREATININE 1.50* 1.40* 1.42*  GLUCOSE 199* 199* 205*   Electrolytes  Recent Labs Lab 12/15/14 0505 12/16/14 0211  CALCIUM 8.5* 9.3   Sepsis Markers  Recent Labs Lab 12/15/14 0245 12/15/14 1010 12/15/14 1152  LATICACIDVEN 3.2* 2.0 2.2*   ABG  Recent Labs Lab 12/15/14 1026 12/15/14 1600 12/16/14 0409  PHART 7.282* 7.301* 7.402  PCO2ART 79.7* 76.1* 65.3*  PO2ART 72.0* 92.6 90.0   Liver Enzymes  Recent Labs Lab 12/14/14 1950  AST 19  ALT 10*  ALKPHOS 56  BILITOT 1.2  ALBUMIN 3.3*   Cardiac Enzymes  Recent Labs Lab 12/15/14 0012 12/15/14 0505 12/15/14 1010  TROPONINI 0.08* 0.08* 0.09*   Glucose  Recent Labs Lab 12/14/14 2359 12/15/14  4888 12/15/14 1148 12/15/14 1626 12/15/14 2109 12/16/14 0750  GLUCAP 326* 228* 187* 204* 191* 185*    Imaging No results found.   ASSESSMENT / PLAN:  PULMONARY OETT A: Acute Hypercarbic on Chronic Respiratory Failure Severe O2 Dependent COPD Small Pleural Effusions  Tobacco Abuse Pulmonary edema  P:   Intermittent BiPAP support for increased WOB PRN CXR  Would not recommend intubation for this patient with comorbids.  Very concerned he would not be able to come off MV.  Change symbicort to budesonide/brovana while inpatient  Lasix per Cardiology  Prednisone 50 mg QD  CARDIOVASCULAR CVL A:  Acute on Chronic Systolic / Diastolic Heart Failure Sinus Tachycardia Mild Troponin Leak  CAD s/p CABG P:   SDU monitoring  Cardiology consulted, appreciate input  Trend troponin, EKG Daily weights  Home lopressor on hold, consider restart at reduced dose?  Will defer to Cardiology   RENAL A:   Mild Hyperkalemia  AKI  P:   Monitor BMP / UOP  Replace electrolytes as indicated   GASTROINTESTINAL A:   Protein Calorie Malnutrition - mild, in setting of chronic disease Possible Dysphagia  P:   NPO while on bipap  Heart healthy diet as tolerated otherwise  SLP evaluation   HEMATOLOGIC A:   Anemia  Thrombocytopenia  P:  Trend CBC Per Primary MD  INFECTIOUS A:   AECOPD P:   BCx2 6/16 >>   Levaquin, start date 6/16, day 3/5  Adjust levaquin to 500 mg  ENDOCRINE A:   DM P:   SSI   NEUROLOGIC A:   Metabolic Encephalopathy - in setting of hypercarbia  P:  Minimize sedating medications  Serial neuro exams  CT head per primary in setting of anisocoria  FAMILY  - Updates:   - Inter-disciplinary family meet or Palliative Care meeting due by:  Recommend considering early palliative care given pulmonary history.    Noe Gens, NP-C [facility] Pgr: [phone] or [phone] 12/16/2014, 9:56 AM  Attending:  I have seen and examined the patient with nurse practitioner/resident and agree with the note above.   Lungs clear on my exam, breathing comfortably; left pupil dilated from bronchodilators  Continue prednisone as written, Levaquin 5 days, BIPAP PRN  Recommend palliative involvement  Roselie Awkward, MD [place] [facility] Pager: [phone] Cell: [phone] After 3pm or if no response, call [phone]

## 2014-12-16 NOTE — Progress Notes (Signed)
Entered pt's room for shift assessment, after listening to lung sounds patient became agitated and told this RN to get out, refuses to cooperate. When this RN asked what was wrong pt stated "You know what" and did not elaborate further. Spoke with charge RN, will evaluate further. Will continue to monitor.

## 2014-12-16 NOTE — Evaluation (Signed)
Clinical/Bedside Swallow Evaluation Patient Details  Name: James Moon MRN: [id number] Date of Birth: 1945/08/21  Today's Date: 12/16/2014 Time: SLP Start Time (ACUTE ONLY): 1200 SLP Stop Time (ACUTE ONLY): 1415 SLP Time Calculation (min) (ACUTE ONLY): 135 min  Past Medical History:  Past Medical History  Diagnosis Date  . Diabetes mellitus   . Colon cancer     s/p colectomy   . Hypertension   . CAD (coronary artery disease)     a. CABG in 1996. b. NSTEMI 06/2011: DES x 2 to SVG to 1st and 2nd OM and SVG to PDA. c. 09/2013: NSTEMI s/p DES to prox SVG-OM1, NCPTCA of mSVG-OM1.  . Ischemic cardiomyopathy Dec. 2012    a. EF 20 to 25% 2012. b. 30-35% echo 3/13. c. EF 40-45% by echo 09/2013 but then at time of NSTEMI was 20% by cath later that month.  . Tobacco abuse   . COPD (chronic obstructive pulmonary disease)   . Chronic systolic CHF (congestive heart failure)   . Chronic respiratory failure     a. On home O2 - due to COPD.  Marland Kitchen NSVT (nonsustained ventricular tachycardia)   . Hypotension   . AICD ([address] [place])   . Thrombocytopenia   . Pulmonary HTN   . Syncope     a. 03/2013 in setting of hypotension.  . Venous stasis ulcers of both lower extremities 08/2014   Past Surgical History:  Past Surgical History  Procedure Laterality Date  . Colon surgery    . Open heart surgery  01/1995  . Coronary stent placement  Dec. 2012    DES to SVG to 1st and 2nd OM and SVG to the PDA  . Coronary artery bypass graft  01/1995  . Coronary angioplasty    . Colon surgery      FOR HISTORY OF COLON CANCER  . Left heart catheterization with coronary angiogram N/A 06/10/2011    Procedure: LEFT HEART CATHETERIZATION WITH CORONARY ANGIOGRAM;  Surgeon: Minus Breeding, MD;  Location[facility] CATH LAB;  Service: Cardiovascular;  Laterality: N/A;  . Graft(s) angiogram  06/10/2011    Procedure: GRAFT(S) Cyril Loosen;  Surgeon: Minus Breeding, MD;  Location: [facility] CATH LAB;  Service: Cardiovascular;;  . Percutaneous  coronary stent intervention (pci-s) N/A 06/11/2011    Procedure: PERCUTANEOUS CORONARY STENT INTERVENTION (PCI-S);  Surgeon: Peter M Martinique, MD;  Location: [facility] CATH LAB;  Service: Cardiovascular;  Laterality: N/A;  . Implantable cardioverter defibrillator implant N/A 12/11/2011    Procedure: IMPLANTABLE CARDIOVERTER DEFIBRILLATOR IMPLANT;  Surgeon: Deboraha Sprang, MD;  Location: [facility] CATH LAB;  Service: Cardiovascular;  Laterality: N/A;  . Left and right heart catheterization with coronary/graft angiogram N/A 10/20/2013    Procedure: LEFT AND RIGHT HEART CATHETERIZATION WITH Beatrix Fetters;  Surgeon: Troy Sine, MD;  Location: [facility] CATH LAB;  Service: Cardiovascular;  Laterality: N/A;   HPI:  69 y/o M, smoker, admitted on 6/15 with acute on chronic respiratory failure in the setting of suspected CHF exacerbation and confusion. Found to have hypercarbic respiratory failure and placed on bipap support.   Assessment / Plan / Recommendation Clinical Impression  Pt on venti mask during assessment, pt able to independently remove and replace for POs with no significant change in O2 saturations. Overally pt demonstrated timely strong swallow, takes small careful sips. In one instance pt had one sensed aspiration event with thin liquids when SLP provided too much assistance. This demonstrates pt with good sensation and benefits from self feeding. Despite dentures not  in place, pt able to [facility] well. Recommend pt initiate a regular diet and thin liquids. Pt will be at risk of aspiration with high respiratory rate, but is safe to continue diet when relatively stable.     Aspiration Risk  Moderate    Diet Recommendation Age appropriate regular solids;Thin   Medication Administration: Whole meds with liquid    Other  Recommendations Oral Care Recommendations: Oral care BID   Follow Up Recommendations       Frequency and Duration        Pertinent Vitals/Pain NA    SLP Swallow Goals      Swallow Study Prior Functional Status       General Other Pertinent Information: 69 y/o M, smoker, admitted on 6/15 with acute on chronic respiratory failure in the setting of suspected CHF exacerbation and confusion. Found to have hypercarbic respiratory failure and placed on bipap support. Type of Study: Bedside swallow evaluation Diet Prior to this Study: NPO Temperature Spikes Noted: No Respiratory Status: Supplemental O2 delivered via (comment) (venti mask) History of Recent Intubation: No Behavior/Cognition: Alert;Cooperative;Pleasant mood Oral Cavity - Dentition: Edentulous Self-Feeding Abilities: Able to feed self Patient Positioning: Upright in bed Baseline Vocal Quality: Normal Volitional Cough: Strong Volitional Swallow: Able to elicit    Oral/Motor/Sensory Function Overall Oral Motor/Sensory Function: Appears within functional limits for tasks assessed   Ice Chips     Thin Liquid Thin Liquid: Within functional limits    Nectar Thick Nectar Thick Liquid: Not tested   Honey Thick Honey Thick Liquid: Not tested   Puree Puree: Within functional limits   Solid   GO    Solid: Within functional limits      Herbie Baltimore, MA CCC-SLP [phone]  Lynann Beaver 12/16/2014,2:27 PM

## 2014-12-16 NOTE — Progress Notes (Signed)
Switched pt to 6L [place] to see if he would tolerate it instead of VM. Pt aware if SOB on [place] we can switch back to VM. RT will continue to monitor.

## 2014-12-16 NOTE — Progress Notes (Signed)
Met briefly with pt and called daughter to introduce Palliative Medicine Team and [place] discussion. Plan to meet 12/17/14 at 10 am with pt and daughter. Romona Curls, ANP-ACHPN

## 2014-12-16 NOTE — Progress Notes (Signed)
Pharmacist Heart Failure Core Measure Documentation  Assessment: James Moon has an EF documented as 25-30% on 08/31/14 by ECHO.  Rationale: Heart failure patients with left ventricular systolic dysfunction (LVSD) and an EF < 40% should be prescribed an angiotensin converting enzyme inhibitor (ACEI) or angiotensin receptor blocker (ARB) at discharge unless a contraindication is documented in the medical record.  This patient is not currently on an ACEI or ARB for HF.  This note is being placed in the record in order to provide documentation that a contraindication to the use of these agents is present for this encounter.  ACE Inhibitor or Angiotensin Receptor Blocker is contraindicated (specify all that apply)  []   ACEI allergy AND ARB allergy []   Angioedema []   Moderate or severe aortic stenosis [x]   Hyperkalemia [x]   Hypotension []   Renal artery stenosis []   Worsening renal function, preexisting renal disease or dysfunction   Excell Seltzer Poteet 12/16/2014 1:28 PM

## 2014-12-16 NOTE — Progress Notes (Signed)
Family Medicine Teaching Service Daily Progress Note Intern Pager: [phone]  Patient name: James Moon Medical record number: [id number] Date of birth: 1945/11/23 Age: 69 y.o. Gender: male  Primary Care Provider: Lamar Blinks, MD Consultants: CHF Code Status: full code per daughter  Pt Overview and Major Events to Date:  6/15: Patient unresponsive in acute respiratory failure requiring BiPAP. Solu-medrol in ED  Assessment and Plan: James Moon is a 69 y.o. male presenting with AMS and respiratory failure . PMH is significant for COPD on home O2, chronic combined systolic and diastolic CHF, pulmonary HTN T2DM, tobacco abuse.  Acute on chronic respiratory failure, Respiratory acidosis in setting of COPD, CHF, pulmonary HTN: Presenting with altered mental status that has somewhat improved per EDP with BiPAP. Desats to 70s when taken off of BiPAP per ED (though not recorded). Afebrile, BP slightly soft to sBP in 100s, VS otherwise stable. ABG shows respiratory acidosis with pH 7.155, pCO2 100, pO2 319, bicarb 34 that improved slightly to pH 7.242, pCO2 82, and pO2 88.9 with BiPAP in the ED. Initial CXR shows chronic bronchitic changes with no infiltrates or edema. Initial troponin 0.06, EKG with LBBB (possibly new since last EKG), PVC, baseline wander, no ST elevations. Given changes in EKG and elevation in trops: 0.06>0.08>0.08, and significant cardiac h/o, ACS must be ruled out. Additionally, BNP noted to be elevated at 1851 and patient's last EF was noted to be 25-30%, there could be a component of CHF exacerbation as well. Very difficult to obtain a history from the patient, however given he was not using his home O2, this could be a COPD component. Pulmonary HTN with PA peak pressure of 39.  - Continue to monitor closely in SDU - Breathing and ABG improved overnight. - will transition to NRB and assess response.  - prednisone 50mg  to complete 1/5 days. - Levaquin IV 550mg  q24h -  can consider switching to PO Doxy  - scheduled duonebs q4h, q2h prn - CCM c/s: Given comorbidities, will try to avoid intubation given concerns of taking off ventilator, change symbicort to budesonide/brovana, continue prednisone  - cardiology c/s: diurese, currently Lasix 80mg  IV BID - trend lactic acid: 2.33>2.5>3.2>2.2 - blood cultures (after levaquin) pending   - Repeat CXR today - risk stratification labs pending - NPO for now, then progress - Given end state COPD, CHF, and pulmonary HTN in the setting of continued smoking, palliative consult. Unfortunately, we have not been able to get in touch with his daughter.   Altered Mental Status: Likely 2/2 respiratory acidosis with hypercapnia as above.Patient oriented to person, place, and situation. Patient states he tripped over a rocker and fell. Does not recall harming himself or hitting his head. On neurologic exam, L pupil constricted, both minimally reactive to light  - Given new information, will get CT head without contrast to assess for bleed - Avoid sedating medications  Ischemic cardiomyopathy, CAD s/p CABG, CHF: Last Echo (3/16) with EF 25-30% and akinesis of apex, anterior septum and anterior wall.Has AICD in place. Appears mildly volume overloaded on exam.  - Dry weight appears to be 143-145lbs from outpt visits, last 08/2014. On admission was 158lbs.  - resume home statin, plavix, metoprolol, aspirin when able to take PO (off BiPAP) - does not appear to be on an ACE-i - aggressive diuresis - cardiology following, appreciate recs - daily weights: [phone] -  Strict I&Os: -1.6L   Concerns for aspiration: Patient's daughter reports choking and coughing when attempting to eat for  last few months - Currently NPO with sips with meds - SLP eval after CTA findings  T2DM: Last A1c 10.5 (08/30/14). On SSI, 70/30 and Lantus at home per med rec. - monitor CBGs: 204- 185 - sensitive SSI - holding home 70/30 - will decrease Lantus  dose to 10 units qhs (home dose 20 units)  Tobacco abuse: Daughter reports he smokes at least 1 PPD - Nicotine patch 21mg  - tobacco cessation counseling after mental status improves  L leg poorly healing wound, chronic venous stasis: Does not appear acutely infected. Seen at wound care clinic as OP per daughter. - Wound care c/s for management inpatient - elevate feet  FEN/GI: NPO for now, then SLP for daughter concerns of aspiration, SLIV Prophylaxis: holding SQ heparin for now given concerns of intracranial bleed  Disposition: continue in SDU  Subjective:  Patient alert today. Trying to get his BiPAP. Does feels SOB with BiPAP on, feels better when we switch to NRB. No chest pain. Patient recalls falling on the day of admission. States he tripped over a rocker. Does not recall hurting himself. States he remembers his daughter was coming by to see him after work since it was his birthday.   Objective: Temp:  [97.4 F (36.3 C)-98.2 F (36.8 C)] 97.9 F (36.6 C) (06/17 0458) Pulse Rate:  [75-131] 93 (06/17 0500) Resp:  [12-23] 15 (06/17 0500) BP: (97-128)/(50-68) 116/51 mmHg (06/17 0500) SpO2:  [96 %-100 %] 98 % (06/17 0500) FiO2 (%):  [40 %] 40 % (06/17 0500) Weight:  [149 lb 14.6 oz (68 kg)] 149 lb 14.6 oz (68 kg) (06/17 0458) Physical Exam: General: lying in bed, very alert, pleasant. No evidence of trauma. Pt does has dried excoriation on top of head, states it's old. Cardiovascular: Tachycardic. Regular rhythm. No m/r/g noted. 3+ ptting edema b/l, worse on the L than the R. Respiratory: No increased WOB. Air movement slightly improved b/l. Some crackles in the bases b/l. No wheezing or rhonchi noted. Currently comfortable satting 92-99% on NRB Abdomen: +BS, soft, ND/NT Extremities: Chronic venous stasis changes to the LEs b/l. 4x2cm full thickness ulcer on left lower extremity with some yellow drainage, covered by foam dressing. Neuro: Alert. Oriented to person, place, time,  and situation. Facial movements symmetric. 5/5 strength in the LE and UE b/l. L pupil constricted on the L compared to the R. Minimally reactive pupils b/l.   Laboratory:  Recent Labs Lab 12/14/14 2001 12/15/14 0505 12/16/14 0211  WBC  --  4.7 6.8  HGB 16.3 10.9* 10.0*  HCT 48.0 41.3 37.0*  PLT  --  99* 101*    Recent Labs Lab 12/14/14 1950 12/14/14 2001 12/15/14 0505 12/16/14 0211  NA  --  138 138 141  K  --  5.2* 5.0 4.7  CL  --  95* 94* 92*  CO2  --   --  32 39*  BUN  --  45* 35* 41*  CREATININE  --  1.50* 1.40* 1.42*  CALCIUM  --   --  8.5* 9.3  PROT 6.9  --   --   --   BILITOT 1.2  --   --   --   ALKPHOS 56  --   --   --   ALT 10*  --   --   --   AST 19  --   --   --   GLUCOSE  --  199* 199* 205*  Lactic acid: 2.33>2.5>3.2 >2.2  ABG  Component Value Date/Time   PHART 7.402 12/16/2014 0409   PCO2ART 65.3* 12/16/2014 0409   PO2ART 90.0 12/16/2014 0409   HCO3 39.8* 12/16/2014 0409   TCO2 41.8 12/16/2014 0409   O2SAT 96.8 12/16/2014 0409    Risk Stratification Labs  TSH    Component Value Date/Time   TSH 2.29 12/09/2012 1030   Hemoglobin A1C    Component Value Date/Time   HGBA1C 10.5* 08/30/2014 2203   Lipid Panel     Component Value Date/Time   CHOL 89 12/15/2014 0245   TRIG 60 12/15/2014 0245   HDL 42 12/15/2014 0245   CHOLHDL 2.1 12/15/2014 0245   VLDL 12 12/15/2014 0245   LDLCALC 35 12/15/2014 0245    Imaging/Diagnostic Tests: CXR: Chronic bronchitic changes. No definite infiltrates. Possible small effusions.  CXR: Developing mild interstitial edema. Slight increase in bibasilar atelectasis. Probable small right pleural effusion.  Archie Patten, MD 12/16/2014, 6:36 AM PGY-1, [place] Intern pager: [phone], text pages welcome

## 2014-12-16 NOTE — Progress Notes (Signed)
Pt appears less agitated/paranoid at this time. CHG bath given, but pt continues to refuse heparin. Will continue to monitor.

## 2014-12-17 ENCOUNTER — Inpatient Hospital Stay (HOSPITAL_COMMUNITY): Payer: PPO

## 2014-12-17 DIAGNOSIS — I501 Left ventricular failure: Secondary | ICD-10-CM | POA: Insufficient documentation

## 2014-12-17 DIAGNOSIS — R4182 Altered mental status, unspecified: Secondary | ICD-10-CM

## 2014-12-17 LAB — GLUCOSE, CAPILLARY
GLUCOSE-CAPILLARY: 197 mg/dL — AB (ref 65–99)
Glucose-Capillary: 334 mg/dL — ABNORMAL HIGH (ref 65–99)
Glucose-Capillary: 274 mg/dL — ABNORMAL HIGH (ref 65–99)
Glucose-Capillary: 294 mg/dL — ABNORMAL HIGH (ref 65–99)

## 2014-12-17 LAB — BASIC METABOLIC PANEL
Anion gap: 8 (ref 5–15)
BUN: 41 mg/dL — ABNORMAL HIGH (ref 6–20)
CALCIUM: 9.4 mg/dL (ref 8.9–10.3)
CHLORIDE: 82 mmol/L — AB (ref 101–111)
CO2: 47 mmol/L — ABNORMAL HIGH (ref 22–32)
CREATININE: 1.43 mg/dL — AB (ref 0.61–1.24)
GFR calc Af Amer: 56 mL/min — ABNORMAL LOW (ref >60)
GFR calc non Af Amer: 48 mL/min — ABNORMAL LOW (ref >60)
GLUCOSE: 355 mg/dL — AB (ref 65–99)
POTASSIUM: 4.5 mmol/L (ref 3.5–5.1)
Sodium: 137 mmol/L (ref 135–145)

## 2014-12-17 LAB — CBC
HCT: 37.7 % — ABNORMAL LOW (ref 39.0–52.0)
Hemoglobin: 10.5 g/dL — ABNORMAL LOW (ref 13.0–17.0)
MCH: 23 pg — ABNORMAL LOW (ref 26.0–34.0)
MCHC: 27.9 g/dL — AB (ref 30.0–36.0)
MCV: 82.7 fL (ref 78.0–100.0)
PLATELETS: 105 10*3/uL — AB (ref 150–400)
RBC: 4.56 MIL/uL (ref 4.22–5.81)
RDW: 19.8 % — ABNORMAL HIGH (ref 11.5–15.5)
WBC: 6.5 10*3/uL (ref 4.0–10.5)

## 2014-12-17 MED ORDER — BUDESONIDE 0.5 MG/2ML IN SUSP
0.5000 mg | Freq: Two times a day (BID) | RESPIRATORY_TRACT | Status: DC
  Administered 2014-12-17 – 2014-12-22 (×10): 0.5 mg via RESPIRATORY_TRACT
  Filled 2014-12-17 (×13): qty 2

## 2014-12-17 MED ORDER — METOPROLOL TARTRATE 12.5 MG HALF TABLET
12.5000 mg | ORAL_TABLET | Freq: Two times a day (BID) | ORAL | Status: DC
  Administered 2014-12-17 – 2014-12-22 (×11): 12.5 mg via ORAL
  Filled 2014-12-17 (×12): qty 1

## 2014-12-17 MED ORDER — FUROSEMIDE 80 MG PO TABS
80.0000 mg | ORAL_TABLET | Freq: Two times a day (BID) | ORAL | Status: DC
  Administered 2014-12-17 – 2014-12-19 (×4): 80 mg via ORAL
  Filled 2014-12-17 (×6): qty 1

## 2014-12-17 MED ORDER — POLYETHYLENE GLYCOL 3350 17 G PO PACK
17.0000 g | PACK | Freq: Every day | ORAL | Status: DC | PRN
  Administered 2014-12-17: 17 g via ORAL
  Filled 2014-12-17 (×2): qty 1

## 2014-12-17 MED ORDER — INSULIN GLARGINE 100 UNIT/ML ~~LOC~~ SOLN
15.0000 [IU] | Freq: Every day | SUBCUTANEOUS | Status: DC
  Administered 2014-12-17 – 2014-12-20 (×4): 15 [IU] via SUBCUTANEOUS
  Filled 2014-12-17 (×5): qty 0.15

## 2014-12-17 MED ORDER — LEVOFLOXACIN 500 MG PO TABS
500.0000 mg | ORAL_TABLET | Freq: Every day | ORAL | Status: DC
  Filled 2014-12-17: qty 1

## 2014-12-17 MED ORDER — ATORVASTATIN CALCIUM 40 MG PO TABS
40.0000 mg | ORAL_TABLET | Freq: Every day | ORAL | Status: DC
  Administered 2014-12-17 – 2014-12-21 (×5): 40 mg via ORAL
  Filled 2014-12-17 (×6): qty 1

## 2014-12-17 MED ORDER — HEPARIN SODIUM (PORCINE) 5000 UNIT/ML IJ SOLN
5000.0000 [IU] | Freq: Three times a day (TID) | INTRAMUSCULAR | Status: DC
  Administered 2014-12-17 – 2014-12-22 (×16): 5000 [IU] via SUBCUTANEOUS
  Filled 2014-12-17 (×19): qty 1

## 2014-12-17 MED ORDER — FUROSEMIDE 10 MG/ML IJ SOLN: 80.0000 mg | Freq: Every day | INTRAMUSCULAR | Status: DC

## 2014-12-17 MED ORDER — IPRATROPIUM-ALBUTEROL 0.5-2.5 (3) MG/3ML IN SOLN
3.0000 mL | RESPIRATORY_TRACT | Status: DC | PRN
Start: 2014-12-17 — End: 2014-12-22

## 2014-12-17 MED ORDER — LEVOFLOXACIN 250 MG PO TABS
250.0000 mg | ORAL_TABLET | Freq: Every day | ORAL | Status: DC
  Administered 2014-12-17: 250 mg via ORAL
  Filled 2014-12-17 (×2): qty 1

## 2014-12-17 MED ORDER — IPRATROPIUM-ALBUTEROL 0.5-2.5 (3) MG/3ML IN SOLN
3.0000 mL | Freq: Four times a day (QID) | RESPIRATORY_TRACT | Status: DC
  Administered 2014-12-17: 3 mL via RESPIRATORY_TRACT
  Filled 2014-12-17: qty 3

## 2014-12-17 NOTE — Consult Note (Signed)
Consultation Note Date: 12/17/2014   Patient Name: James Moon  DOB: July 11, 1945  MRN: [id number]  Age / Sex: 69 y.o., male   PCP: Darreld Mclean, MD Referring Physician: Alveda Reasons, MD  Reason for Consultation: Establishing goals of care  Palliative Care Assessment and Plan Summary of Established Goals of Care and Medical Treatment Preferences   Clinical Assessment/Narrative: Pt is a 69 yo man admitted with acute on chronic hypercarbic respiratory failure  In the setting of CHF exacerbation. He has a PMH of ICM with EF 20-30%, 4 MI's, CABG (1996), DM, HTN, bilateral venous stasis non-healing BLE ulcers.He was placed on BiPaP, then transitioned to 50% VM . He was having trouble coming off higher 02 delivery until last night. Now on 5L [place] and has been sating about 93%. He is now eating small amounts but is beginning to show signs of choking after eating. Met with pt and his dtr, James Moon, today for [place] discussion. Pt at one point asked that I speak to daughter about these issues outside of the room. He did share that at one point he had advance directives and he would not want mechanical ventilation. Pt has a significant medical hx and has rebounded from critical events in the past but does recognize that may not be the case at this time. He himself referenced he would likely not be able to live in his apartment anymore. Spent time discussing disease progression r/t end stage COPD as well as CHF, ICM with EF 20-30% and what they could expect going forward. Also addressed aspiration as well as  aspiration pna given new sign of choking after eating. Introduced the concept of hospice and what they provide.  Contacts/Participants in Discussion: Primary Decision Maker: James Moon [phone] HCPOA: yes. No other living relatives. Only child and he is a widower   Code Status/Advance Care Planning:  Partial : Daughter felt comfortable changing Full Code to a Partial Code, not to  intubate at this point. She does not to discuss with her father other aspects of care going forward such CPR, His defibrillator status, etc. Daughter given MOST form as a tool as well as "Hard Choices for [employer]" booklet. Informed her our team could help her complete the MOST form if that would be helpful  Symptom Management:   Dyspnea: Managed by CCM.    Additional Recommendations (Limitations, Scope, Preferences):  Pt recognizing that he may not be able to live alone at this point. He may also be open to SNF for cardiopulmonary rehab and strenghtening Psycho-social/Spiritual:   Support System: Daughter  Desire for further Chaplaincy support:no  Prognosis: < 6 months. I did not share this with pt or daughter at this point. It is my opinion that he would meet hospice criteria at this point.  Discharge Planning:  James Moon for rehab with Palliative care service follow-up is what I would recommend but is still undetermined at this point. Did introduce the concept of comfort care, hospice, as well as symptom management for dyspnea.       Chief Complaint/History of Present Illness: Pt is a 69 yo man admitted with hypercarbic respiratory failure, with associated confusion. He has been treated with IV steroids, abx, fluids, as well as bipap and 50% VM and is now on 5 L [place]. Prior to this he was living alone in his apartment. 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 Respiratory distress . Respiratory failure, acute-on-chronic  Palliative Review of Systems: Mild confusion I have reviewed the medical record, interviewed the patient and family, and examined the patient. The following aspects are pertinent.  Past Medical History  Diagnosis Date  . Diabetes mellitus   . Colon cancer     s/p colectomy   . Hypertension   . CAD (coronary artery disease)     a. CABG in 1996. b. NSTEMI  06/2011: DES x 2 to SVG to 1st and 2nd OM and SVG to PDA. c. 09/2013: NSTEMI s/p DES to prox SVG-OM1, NCPTCA of mSVG-OM1.  . Ischemic cardiomyopathy Dec. 2012    a. EF 20 to 25% 2012. b. 30-35% echo 3/13. c. EF 40-45% by echo 09/2013 but then at time of NSTEMI was 20% by cath later that month.  . Tobacco abuse   . COPD (chronic obstructive pulmonary disease)   . Chronic systolic CHF (congestive heart failure)   . Chronic respiratory failure     a. On home O2 - due to COPD.  Marland Kitchen NSVT (nonsustained ventricular tachycardia)   . Hypotension   . AICD ([address] [place])   . Thrombocytopenia   . Pulmonary HTN   . Syncope     a. 03/2013 in setting of hypotension.  . Venous stasis ulcers of both lower extremities 08/2014   History   Social History  . Marital Status: Widowed    Spouse Name: N/A  . Number of Children: N/A  . Years of Education: N/A   Social History Main Topics  . Smoking status: Current Every Day Smoker -- 0.50 packs/day for 50 years    Types: Cigarettes    Start date: 02/26/2012  . Smokeless tobacco: Never Used     Comment: he has tried to quit many times.  . Alcohol Use: No  . Drug Use: No  . Sexual Activity: Not on file   Other Topics Concern  . None   Social History Narrative   Lives in [place], has 1 child.  He is widowed .  He works at [employer].   Family History  Problem Relation Age of Onset  . Heart attack Father   . Diabetes Father   . Breast cancer Mother    Scheduled Meds: . antiseptic oral rinse  7 mL Mouth Rinse BID  . arformoterol  15 mcg Nebulization BID  . aspirin EC  81 mg Oral Daily  . atorvastatin  40 mg Oral q1800  . budesonide  0.5 mg Nebulization BID  . Chlorhexidine Gluconate Cloth  6 each Topical Q0600  . clopidogrel  75 mg Oral Daily  . docusate sodium  100 mg Oral BID  . furosemide  80 mg Oral BID  . heparin subcutaneous  5,000 Units Subcutaneous 3 times per day  . insulin aspart  0-5 Units Subcutaneous QHS  . insulin aspart  0-9 Units  Subcutaneous TID WC  . insulin glargine  15 Units Subcutaneous Q2200  . levofloxacin  500 mg Oral QHS  . metoprolol tartrate  12.5 mg Oral BID  . mupirocin ointment  1 application Nasal BID  . nicotine  21 mg Transdermal Daily  . predniSONE  50 mg Oral Q breakfast  . sodium chloride  3 mL Intravenous Q12H   Continuous Infusions:  PRN Meds:.acetaminophen **OR** acetaminophen, ipratropium-albuterol, nitroGLYCERIN, ondansetron (ZOFRAN) IV Medications Prior to Admission:  Prior to Admission medications   Medication Sig Start Date End Date Taking? Authorizing Provider  aspirin EC 81  MG tablet Take 81 mg by mouth daily.    Yes Historical Provider, MD  atorvastatin (LIPITOR) 40 MG tablet TAKE ONE TABLET BY MOUTH ONCE DAILY. 12/01/14  Yes Thayer Headings, MD  clopidogrel (PLAVIX) 75 MG tablet Take 1 tablet (75 mg total) by mouth daily. 12/01/14  Yes Thayer Headings, MD  collagenase (SANTYL) ointment Apply topically daily. 09/03/14  Yes Hosie Poisson, MD  doxycycline (VIBRA-TABS) 100 MG tablet Take 1 tablet (100 mg total) by mouth every 12 (twelve) hours. 09/03/14  Yes Hosie Poisson, MD  furosemide (LASIX) 40 MG tablet Take 1 tablet (40 mg total) by mouth 2 (two) times daily. 12/01/14  Yes Thayer Headings, MD  insulin aspart (NOVOLOG) 100 UNIT/ML injection CBG 70 - 120: 0 units CBG 121 - 150: 2 units CBG 151 - 200: 3 units CBG 201 - 250: 5 units CBG 251 - 300: 8 units CBG 301 - 350: 11 units CBG 351 - 400: 15 units 09/03/14  Yes Hosie Poisson, MD  Insulin Glargine (LANTUS SOLOSTAR) 100 UNIT/ML Solostar Pen Inject 20 Units into the skin daily at 10 pm. Patient taking differently: Inject 20-24 Units into the skin every morning.  10/22/13  Yes Olam Idler, MD  insulin NPH-regular Human (NOVOLIN 70/30) (70-30) 100 UNIT/ML injection Inject 3 Units into the skin every 8 (eight) hours.   Yes Historical Provider, MD  ipratropium-albuterol (DUONEB) 0.5-2.5 (3) MG/3ML SOLN Take 3 mLs by nebulization every 6 (six)  hours as needed. 09/03/14  Yes Hosie Poisson, MD  metoprolol succinate (TOPROL-XL) 25 MG 24 hr tablet Take 0.5 tablets (12.5 mg total) by mouth daily. 12/01/14  Yes Thayer Headings, MD  NEEDLE, DISP, 22 G (B-D DISP NEEDLE 22GX3/4") 22G X 3/4" MISC 1 Syringe by Does not apply route daily. 10/22/13  Yes Olam Idler, MD  nitroGLYCERIN (NITROSTAT) 0.4 MG SL tablet Place 1 tablet (0.4 mg total) under the tongue every 5 (five) minutes as needed for chest pain. For chest pain 12/01/14  Yes Thayer Headings, MD  RELION INSULIN SYR 0.3ML/31G 31G X 5/16" 0.3 ML MISC USE THREE TIMES DAILY 10/07/14  Yes Darreld Mclean, MD  SYMBICORT 160-4.5 MCG/ACT inhaler INHALE TWO PUFFS BY MOUTH TWICE DAILY 11/24/14  Yes Gay Filler Copland, MD   No Known Allergies CBC:    Component Value Date/Time   WBC 6.5 12/17/2014 0228   HGB 10.5* 12/17/2014 0228   HCT 37.7* 12/17/2014 0228   PLT 105* 12/17/2014 0228   MCV 82.7 12/17/2014 0228   NEUTROABS 3.8 09/03/2014 0236   LYMPHSABS 0.4* 09/03/2014 0236   MONOABS 0.6 09/03/2014 0236   EOSABS 0.0 09/03/2014 0236   BASOSABS 0.0 09/03/2014 0236   Comprehensive Metabolic Panel:    Component Value Date/Time   NA 137 12/17/2014 0228   K 4.5 12/17/2014 0228   CL 82* 12/17/2014 0228   CO2 47* 12/17/2014 0228   BUN 41* 12/17/2014 0228   CREATININE 1.43* 12/17/2014 0228   CREATININE 1.05 10/27/2013 1146   GLUCOSE 355* 12/17/2014 0228   CALCIUM 9.4 12/17/2014 0228   AST 19 12/14/2014 1950   ALT 10* 12/14/2014 1950   ALKPHOS 56 12/14/2014 1950   BILITOT 1.2 12/14/2014 1950   PROT 6.9 12/14/2014 1950   ALBUMIN 3.3* 12/14/2014 1950    Physical Exam: Vital Signs: BP 96/45 mmHg  Pulse 94  Temp(Src) 97.2 F (36.2 C) (Oral)  Resp 15  Ht _0  (1.803 m)  Wt 63.7 kg (140 lb  6.9 oz)  BMI 19.60 kg/m2  SpO2 94% SpO2: SpO2: 94 % O2 Device: O2 Device: Nasal Cannula O2 Flow Rate: O2 Flow Rate (L/min): 5 L/min Intake/output summary:  Intake/Output Summary (Last 24 hours) at  12/17/14 1134 Last data filed at 12/17/14 1000  Gross per 24 hour  Intake    823 ml  Output   3600 ml  Net  -2777 ml   LBM:   Baseline Weight: Weight: 72 kg (158 lb 11.7 oz) Most recent weight: Weight: 63.7 kg (140 lb 6.9 oz)  Exam Findings:  General: Cachetic older man. Mild confusion noted. He is somnolent but awakens easily to voice  Resp: Mild work of breathing observed Musculoskeletal: MAE X4          Palliative Performance Scale: 40-50%              Additional Data Reviewed: Recent Labs     12/16/14  0211  12/17/14  0228  WBC  6.8  6.5  HGB  10.0*  10.5*  PLT  101*  105*  NA  141  137  BUN  41*  41*  CREATININE  1.42*  1.43*     Time In: 1000 Time Out: 1120 Time Total: 80 min Greater than 50%  of this time was spent counseling and coordinating care related to the above assessment and plan.  Signed by: Dory Horn, NP  Dory Horn, NP  12/17/2014, 11:34 AM  Please contact Palliative Medicine Team phone at [phone] for questions and concerns.

## 2014-12-17 NOTE — Progress Notes (Signed)
PULMONARY / CRITICAL CARE MEDICINE   Name: James Moon MRN: [id number] DOB: [birth year]    ADMISSION DATE:  12/14/2014 CONSULTATION DATE:  12/15/14   REFERRING MD :  Dr. Mingo Amber / [facility]   CHIEF COMPLAINT:  Acute on Chronic Respiratory Failure   INITIAL PRESENTATION: 69 y/o M, smoker, admitted on 6/15 with acute on chronic respiratory failure in the setting of suspected CHF exacerbation and confusion.  Found to have hypercarbic respiratory failure and placed on bipap support.  PCCM consulted for evaluation 6/16.    STUDIES:  6/16  CXR >> chronic bronchitic changes, small bilateral effusions  SIGNIFICANT EVENTS: 6/15  Admit with SOB, acute on chronic resp fx, confusion/lethargy, LE swelling. 6/16  Mild agitation overnight, resolved by am.   6/17  50% VM, mentation much improved 6/18 5L [place] sats better , off bipap     SUBJECTIVE: Pt remains on nasal cannula.  Not on bipap.  VITAL SIGNS: Temp:  [97.8 F (36.6 C)-98.8 F (37.1 C)] 98.6 F (37 C) (06/18 0410) Pulse Rate:  [94-107] 94 (06/18 0410) Resp:  [15-23] 15 (06/18 0410) BP: (96-118)/(45-73) 96/45 mmHg (06/18 0410) SpO2:  [92 %-100 %] 94 % (06/18 0906) FiO2 (%):  [45 %] 45 % (06/17 1215) Weight:  [63.7 kg (140 lb 6.9 oz)] 63.7 kg (140 lb 6.9 oz) (06/18 0414)   VENTILATOR SETTINGS: Vent Mode:  [-]  FiO2 (%):  [45 %] 45 %   INTAKE / OUTPUT:  Intake/Output Summary (Last 24 hours) at 12/17/14 0916 Last data filed at 12/17/14 0411  Gross per 24 hour  Intake    870 ml  Output   4000 ml  Net  -3130 ml    PHYSICAL EXAMINATION: General:  Chronically ill adult male in NAD Neuro:  Awake, alert, oriented.  MAE.  L pupil 71mm, R 20mm (pt states is normal) HEENT:  MM pink/moist, JVD+ Cardiovascular:  s1s2 rrr, distant tones  Lungs:  resp's even/non-labored, lungs bilaterally diminished but improved air movement compared to 6/16  Abdomen:  NTND, bsx4 active  Musculoskeletal:  No acute deformities  Skin:  Thin skin,  scattered bruises, BLE's with chronic venous stasis, odor, LLE venous wound  LABS:  CBC  Recent Labs Lab 12/15/14 0505 12/16/14 0211 12/17/14 0228  WBC 4.7 6.8 6.5  HGB 10.9* 10.0* 10.5*  HCT 41.3 37.0* 37.7*  PLT 99* 101* 105*   Coag's No results for input(s): APTT, INR in the last 168 hours.   BMET  Recent Labs Lab 12/15/14 0505 12/16/14 0211 12/17/14 0228  NA 138 141 137  K 5.0 4.7 4.5  CL 94* 92* 82*  CO2 32 39* 47*  BUN 35* 41* 41*  CREATININE 1.40* 1.42* 1.43*  GLUCOSE 199* 205* 355*   Electrolytes  Recent Labs Lab 12/15/14 0505 12/16/14 0211 12/17/14 0228  CALCIUM 8.5* 9.3 9.4   Sepsis Markers  Recent Labs Lab 12/15/14 0245 12/15/14 1010 12/15/14 1152  LATICACIDVEN 3.2* 2.0 2.2*   ABG  Recent Labs Lab 12/15/14 1026 12/15/14 1600 12/16/14 0409  PHART 7.282* 7.301* 7.402  PCO2ART 79.7* 76.1* 65.3*  PO2ART 72.0* 92.6 90.0   Liver Enzymes  Recent Labs Lab 12/14/14 1950  AST 19  ALT 10*  ALKPHOS 56  BILITOT 1.2  ALBUMIN 3.3*   Cardiac Enzymes  Recent Labs Lab 12/15/14 0012 12/15/14 0505 12/15/14 1010  TROPONINI 0.08* 0.08* 0.09*   Glucose  Recent Labs Lab 12/15/14 2109 12/16/14 0750 12/16/14 1157 12/16/14 1735 12/16/14 2141 12/17/14 9767  GLUCAP 191* 185* 194* 271* 375* 334*    Imaging Ct Head Wo Contrast  12/16/2014   CLINICAL DATA:  Confusion, fall, hypoxia, history colon cancer, CHF, diabetes mellitus, hypertension, coronary artery disease, ischemic cardiomyopathy, smoker, COPD  EXAM: CT HEAD WITHOUT CONTRAST  TECHNIQUE: Contiguous axial images were obtained from the base of the skull through the vertex without intravenous contrast.  COMPARISON:  04/21/2013  FINDINGS: Generalized atrophy.  Normal ventricular morphology.  No midline shift or mass effect.  Small vessel chronic ischemic changes of deep cerebral white matter.  No intracranial hemorrhage, mass lesion, or acute infarction.  Visualized paranasal sinuses  and mastoid air cells clear.  Bones unremarkable.  Significant atherosclerotic calcifications of the internal carotid and vertebral arteries at skullbase.  IMPRESSION: Atrophy with small vessel chronic ischemic changes of deep cerebral white matter.  No acute intracranial abnormalities.   Electronically Signed   By: Lavonia Dana M.D.   On: 12/16/2014 13:46   Dg Chest Port 1 View  12/16/2014   CLINICAL DATA:  Acute on chronic respiratory failure, pulmonary edema with CHF and reduced LEFT ventricular function, history hypertension, ischemic cardiomyopathy, diabetes mellitus, colon cancer, smoking, COPD, pulmonary hypertension  EXAM: PORTABLE CHEST - 1 VIEW  COMPARISON:  Portable exam 1011 hours compared 12/15/2014  FINDINGS: LEFT subclavian AICD lead tip projects over RIGHT ventricle.  Borderline enlargement of cardiac silhouette post CABG.  Slight pulmonary vascular congestion.  Mild perihilar infiltrates likely pulmonary edema, little changed.  Small RIGHT pleural effusion.  No pneumothorax.  IMPRESSION: Mild persistent CHF.   Electronically Signed   By: Lavonia Dana M.D.   On: 12/16/2014 10:20     ASSESSMENT / PLAN:  PULMONARY OETT A: Acute Hypercarbic on Chronic Respiratory Failure>>improved Severe O2 Dependent COPD Small Pleural Effusions  Tobacco Abuse Pulmonary edema >>improved  P:   Intermittent BiPAP support for increased WOB PRN CXR  We cont to  not recommend intubation for this patient with comorbids.  Very concerned he would not be able to come off MV.  Cont  budesonide/brovana while inpatient  Lasix per Cardiology  Cont Prednisone 50 mg QD Oxygen titration  CARDIOVASCULAR CVL A:  Acute on Chronic Systolic / Diastolic Heart Failure Sinus Tachycardia Mild Troponin Leak  CAD s/p CABG P:  SDU monitoring  Cardiology consulted, appreciate input  Trend troponin, EKG Daily weights  Home lopressor on hold, consider restart at reduced dose?  Will defer to Cardiology   RENAL A:    Mild Hyperkalemia  AKI  P:   Monitor BMP / UOP  Replace electrolytes as indicated   GASTROINTESTINAL A:   Protein Calorie Malnutrition - mild, in setting of chronic disease Possible Dysphagia  P:   Heart healthy diet as tolerated otherwise  SLP evaluation   HEMATOLOGIC A:   Anemia  Thrombocytopenia  P:  Trend CBC Per Primary MD  INFECTIOUS A:   AECOPD P:   BCx2 6/16 >>   Levaquin, start date 6/16, day 4/5  Cont  levaquin to 500 mg  ENDOCRINE A:   DM P:   SSI   NEUROLOGIC A:   Metabolic Encephalopathy - in setting of hypercarbia >>improved  P:  Minimize sedating medications  Serial neuro exams  CT head per primary in setting of anisocoria  FAMILY  - Updates:   - Inter-disciplinary family meet or Palliative Care meeting due by:  Palliative care meeting with family 12/17/2014   12/17/2014, 9:16 AM

## 2014-12-17 NOTE — Progress Notes (Signed)
Patient ID: James Moon, male   DOB: [birth year], 69 y.o.   MRN: [id number]    Patient Name: James Moon Date of Encounter: 12/17/2014     Active Problems:   Respiratory distress   Respiratory failure, acute-on-chronic   Pressure ulcer   Acute respiratory failure with hypoxia and hypercarbia   Acute exacerbation of CHF (congestive heart failure)   Elevated troponin   Somnolence   COPD exacerbation   Altered mental state   Palliative care encounter   Acute respiratory failure    SUBJECTIVE  Breathing improved. Denies chest pain.   CURRENT MEDS . antiseptic oral rinse  7 mL Mouth Rinse BID  . arformoterol  15 mcg Nebulization BID  . aspirin EC  81 mg Oral Daily  . budesonide  0.5 mg Nebulization BID  . Chlorhexidine Gluconate Cloth  6 each Topical Q0600  . clopidogrel  75 mg Oral Daily  . docusate sodium  100 mg Oral BID  . furosemide  80 mg Intravenous BID  . heparin subcutaneous  5,000 Units Subcutaneous 3 times per day  . insulin aspart  0-5 Units Subcutaneous QHS  . insulin aspart  0-9 Units Subcutaneous TID WC  . insulin glargine  15 Units Subcutaneous Q2200  . levofloxacin (LEVAQUIN) IV  500 mg Intravenous QHS  . mupirocin ointment  1 application Nasal BID  . nicotine  21 mg Transdermal Daily  . predniSONE  50 mg Oral Q breakfast  . sodium chloride  3 mL Intravenous Q12H    OBJECTIVE  Filed Vitals:   12/17/14 0410 12/17/14 0414 12/17/14 0730 12/17/14 0906  BP: 96/45     Pulse: 94     Temp: 98.6 F (37 C)  97.2 F (36.2 C)   TempSrc: Oral  Oral   Resp: 15     Height:      Weight:  140 lb 6.9 oz (63.7 kg)    SpO2: 93%   94%    Intake/Output Summary (Last 24 hours) at 12/17/14 1034 Last data filed at 12/17/14 0411  Gross per 24 hour  Intake    820 ml  Output   3600 ml  Net  -2780 ml   Filed Weights   12/15/14 0359 12/16/14 0458 12/17/14 0414  Weight: 159 lb 3.2 oz (72.213 kg) 149 lb 14.6 oz (68 kg) 140 lb 6.9 oz (63.7 kg)    PHYSICAL  EXAM  General: Pleasant, NAD. Neuro: Alert and oriented X 3. Moves all extremities spontaneously. Psych: Normal affect. HEENT:  Normal  Neck: Supple without bruits or JVD. Lungs:  Resp regular and unlabored, CTA. Heart: RRR no s3, s4, or murmurs. Abdomen: Soft, non-tender, non-distended, BS + x 4.  Extremities: No clubbing, cyanosis or edema. DP/PT/Radials 2+ and equal bilaterally.  Accessory Clinical Findings  CBC  Recent Labs  12/16/14 0211 12/17/14 0228  WBC 6.8 6.5  HGB 10.0* 10.5*  HCT 37.0* 37.7*  MCV 85.5 82.7  PLT 101* 144*   Basic Metabolic Panel  Recent Labs  12/16/14 0211 12/17/14 0228  NA 141 137  K 4.7 4.5  CL 92* 82*  CO2 39* 47*  GLUCOSE 205* 355*  BUN 41* 41*  CREATININE 1.42* 1.43*  CALCIUM 9.3 9.4   Liver Function Tests  Recent Labs  12/14/14 1950  AST 19  ALT 10*  ALKPHOS 56  BILITOT 1.2  PROT 6.9  ALBUMIN 3.3*   No results for input(s): LIPASE, AMYLASE in the last 72 hours. Cardiac Enzymes  Recent  Labs  12/15/14 0012 12/15/14 0505 12/15/14 1010  TROPONINI 0.08* 0.08* 0.09*   BNP Invalid input(s): POCBNP D-Dimer No results for input(s): DDIMER in the last 72 hours. Hemoglobin A1C  Recent Labs  12/15/14 0505  HGBA1C 7.1*   Fasting Lipid Panel  Recent Labs  12/15/14 0245  CHOL 89  HDL 42  LDLCALC 35  TRIG 60  CHOLHDL 2.1   Thyroid Function Tests No results for input(s): TSH, T4TOTAL, T3FREE, THYROIDAB in the last 72 hours.  Invalid input(s): FREET3  TELE  Sinus tachycardia  Radiology/Studies  Ct Head Wo Contrast  12/16/2014   CLINICAL DATA:  Confusion, fall, hypoxia, history colon cancer, CHF, diabetes mellitus, hypertension, coronary artery disease, ischemic cardiomyopathy, smoker, COPD  EXAM: CT HEAD WITHOUT CONTRAST  TECHNIQUE: Contiguous axial images were obtained from the base of the skull through the vertex without intravenous contrast.  COMPARISON:  04/21/2013  FINDINGS: Generalized atrophy.   Normal ventricular morphology.  No midline shift or mass effect.  Small vessel chronic ischemic changes of deep cerebral white matter.  No intracranial hemorrhage, mass lesion, or acute infarction.  Visualized paranasal sinuses and mastoid air cells clear.  Bones unremarkable.  Significant atherosclerotic calcifications of the internal carotid and vertebral arteries at skullbase.  IMPRESSION: Atrophy with small vessel chronic ischemic changes of deep cerebral white matter.  No acute intracranial abnormalities.   Electronically Signed   By: Lavonia Dana M.D.   On: 12/16/2014 13:46   Dg Chest Port 1 View  12/17/2014   CLINICAL DATA:  Acute onset of respiratory failure. Initial encounter.  EXAM: PORTABLE CHEST - 1 VIEW  COMPARISON:  Chest radiograph performed 12/16/2014  FINDINGS: The lungs are well expanded. Bibasilar airspace opacities may reflect pneumonia or pulmonary edema, similar in appearance to the prior study. Underlying vascular congestion is noted. The left costophrenic angle is incompletely imaged on this study. No definite pleural effusion or pneumothorax is seen.  The cardiomediastinal silhouette is borderline normal in size. The patient is status post median sternotomy, with evidence of prior CABG. An AICD is noted overlying the left chest wall, with a single lead ending overlying the right ventricle. No acute osseous abnormalities are seen.  IMPRESSION: Bibasilar airspace opacities may reflect pneumonia or pulmonary edema, similar in appearance to the prior study. Underlying vascular congestion noted.   Electronically Signed   By: Garald Balding M.D.   On: 12/17/2014 09:23   Dg Chest Port 1 View  12/16/2014   CLINICAL DATA:  Acute on chronic respiratory failure, pulmonary edema with CHF and reduced LEFT ventricular function, history hypertension, ischemic cardiomyopathy, diabetes mellitus, colon cancer, smoking, COPD, pulmonary hypertension  EXAM: PORTABLE CHEST - 1 VIEW  COMPARISON:  Portable  exam 1011 hours compared 12/15/2014  FINDINGS: LEFT subclavian AICD lead tip projects over RIGHT ventricle.  Borderline enlargement of cardiac silhouette post CABG.  Slight pulmonary vascular congestion.  Mild perihilar infiltrates likely pulmonary edema, little changed.  Small RIGHT pleural effusion.  No pneumothorax.  IMPRESSION: Mild persistent CHF.   Electronically Signed   By: Lavonia Dana M.D.   On: 12/16/2014 10:20   Dg Chest Port 1 View  12/15/2014   CLINICAL DATA:  Respiratory failure, acute on chronic.  EXAM: PORTABLE CHEST - 1 VIEW  COMPARISON:  1 day prior  FINDINGS: Pacer/AICD with tip at right ventricle. Patient rotated left. Prior median sternotomy. Midline trachea. Mild cardiomegaly. Trace right pleural fluid. No pneumothorax. Skin fold over the left hemi thorax laterally. New or increased  pulmonary interstitial prominence and indistinctness. Patchy bibasilar airspace disease.  IMPRESSION: Developing mild interstitial edema.  Slight increase in bibasilar atelectasis.  Probable small right pleural effusion.   Electronically Signed   By: Abigail Miyamoto M.D.   On: 12/15/2014 09:10   Dg Chest Portable 1 View  12/14/2014   CLINICAL DATA:  Shortness of breath today.  EXAM: PORTABLE CHEST - 1 VIEW  COMPARISON:  09/02/2014  FINDINGS: The heart is borderline enlarged but stable. Stable tortuosity and calcification of the thoracic aorta. Stable surgical changes from bypass surgery. Coronary artery stents are noted. The right ventricular pacer wire is stable. The lungs are clear. Chronic bronchitic changes but no infiltrates or edema. Possible small effusions.  IMPRESSION: Chronic bronchitic changes.  No definite infiltrates.  Possible small effusions.   Electronically Signed   By: Marijo Sanes M.D.   On: 12/14/2014 20:20    ASSESSMENT AND PLAN  1. Acute on chronic systolic heart failure 2. Ischemic cardiomyopathy 3. Chronic respiratory failure with chronic hypocapnea 4. Advanced COPD Rec: He has  lost 8 lbs in past 24 hours and is nearly euvolemic. His heart rate is up and will add back metoprolol and watch for wheezing. Will back off on diuretic therapy. Hopefully he will be ready for discharge in 36-48 hours.  Araeya Lamb,M.D.  12/17/2014 10:34 AM

## 2014-12-17 NOTE — Progress Notes (Signed)
Family Medicine Teaching Service Daily Progress Note Intern Pager: [phone]  Patient name: James Moon [facility] Medical record number: [id number] Date of birth: Nov 26, 1945 Age: 69 y.o. Gender: male  Primary Care Provider: Lamar Blinks, MD Consultants: CHF Code Status: full code per daughter  Pt Overview and Major Events to Date:  6/15: Patient unresponsive in acute respiratory failure requiring BiPAP. Solu-medrol in ED 6/16: continued to be on BiPAP 6/17: Weaned to [place]  Assessment and Plan: CAROLL CUNNINGTON is a 69 y.o. male presenting with AMS and respiratory failure . PMH is significant for COPD on home O2, chronic combined systolic and diastolic CHF, pulmonary HTN T2DM, tobacco abuse.  Acute on chronic respiratory failure, Respiratory acidosis in setting of CHF, pulmonary HTN, COPD: Presenting with altered mental status that has somewhat improved per EDP with BiPAP. Desats to 70s when taken off of BiPAP per ED (though not recorded). Afebrile, BP slightly soft to sBP in 100s, VS otherwise stable. ABG shows respiratory acidosis with pH 7.155, pCO2 100, pO2 319, bicarb 34 that improved slightly to pH 7.242, pCO2 82, and pO2 88.9 with BiPAP in the ED. Initial CXR shows chronic bronchitic changes with no infiltrates or edema. Initial troponin 0.06, EKG with LBBB (possibly new since last EKG), PVC, baseline wander, no ST elevations. Given changes in EKG and elevation in trops: 0.06>0.08>0.08, and significant cardiac history, ACS was ruled out. Additionally, BNP noted to be elevated at 1851 and patient's last EF was noted to be 25-30%, therefore a CHF exacerbation on his already poor cadiopulmonary stated (severe COPD with continued smoking) is the most likely etiology. Repeat CXR on 6/17 revealed mild persistent CHF.  - Patient currently on 5L [place], consider transfer out of SDU today.  - prednisone 50mg  to complete 3/5 days. - Levaquin IV 500mg  q24h, D3 > will transition to oral today - scheduled  duonebs spaced to q6hr - CCM c/s: changed symbicort to budesonide/brovana, continue prednisone, pt not a great candidate   - cardiology c/s: diurese, this was most likely 2/2 CHF exacerbation given quick turn around. Currently Lasix 80mg  IV BID> will transition to Lasix 80mg  PO BID. - trend lactic acid: 2.33>2.5>3.2>2.2 - blood cultures (after levaquin) NGTD - Given end state COPD, CHF, and pulmonary HTN in the setting of continued smoking, palliative consult: there will be a meeting today.  Altered Mental Status: Likely 2/2 respiratory acidosis with hypercapnia as above.Patient oriented to person, place, and situation. Patient states he tripped over a rocker and fell. Does not recall harming himself or hitting his head. On neurologic exam, L pupil constricted, both minimally reactive to light. CT of head without acute abnormalities.  - Avoid sedating medications  Ischemic cardiomyopathy, CAD s/p CABG, CHF: Last Echo (3/16) with EF 25-30% and akinesis of apex, anterior septum and anterior wall.Has AICD in place. Appears mildly volume overloaded on exam.  - Dry weight appears to be 143-145lbs from outpt visits, last 08/2014. On admission was 158lbs.  - home statin, plavix, aspirin AP - restart home metoprolol today: watch HR, BP - does not appear to be on an ACE-i - decreasing diuresis - cardiology following, appreciate recs - daily weights: 505>397>673 >140 -  Strict I&Os: -3.1L   Concerns for aspiration: Patient's daughter reports choking and coughing when attempting to eat for last few months - SLP with no concerns for aspiration.  T2DM: Last A1c 10.5 (08/30/14). On SSI, 70/30 and Lantus at home per med rec. - monitor CBGs: 375- 185 - sensitive SSI - holding  home 70/30 - will increase Lantus from 10  To 15 units qhs (home dose 20 units)  Tobacco abuse: Daughter reports he smokes at least 1 PPD - Nicotine patch 21mg  - tobacco cessation counseling after mental status improves  L leg  poorly healing wound, chronic venous stasis: Does not appear acutely infected. Seen at wound care clinic as OP per daughter. - Wound care c/s for management inpatient - elevate feet  Thrombocytopenia: Stable from previous hospitalizations. One note from 2005 notes possible hematology outpatient follow up, however unsure if this ever occurred. - continue to monitor  FEN/GI: Regular diet, SLIV Prophylaxis: SQ heparin, SCDs  Disposition: transfer out of SDU, PT c/s, d/c pending respiratory status  Subjective:  Patient with improved breathing. No chest pain. Minimal SOB. Wants to go home and states he could stay with his daughter for a while.  Objective: Temp:  [97.8 F (36.6 C)-98.8 F (37.1 C)] 98.6 F (37 C) (06/18 0410) Pulse Rate:  [94-107] 94 (06/18 0410) Resp:  [15-23] 15 (06/18 0410) BP: (96-118)/(45-73) 96/45 mmHg (06/18 0410) SpO2:  [92 %-100 %] 94 % (06/18 0906) FiO2 (%):  [45 %] 45 % (06/17 1215) Weight:  [140 lb 6.9 oz (63.7 kg)] 140 lb 6.9 oz (63.7 kg) (06/18 0414) Physical Exam: General: lying in bed, very alert, pleasant. NAD.  Cardiovascular: Tachycardic. Regular rhythm. No m/r/g noted. 1+ ptting edema b/l, worse on the L than the R. Respiratory: No increased WOB. Air movement improved b/l. Coarse crackles in the bases b/l. No wheezing or rhonchi noted. Currently comfortable satting 96% on 5L [place] Abdomen: +BS, soft, ND/NT Extremities: Chronic venous stasis changes to the LEs b/l. 4x2cm full thickness ulcer on left lower extremity with minimal yellow drainage, covered by foam dressing. Very dry, scaly skin Neuro: Alert. Oriented to person, place, time, and situation. Facial movements symmetric. 5/5 strength in the LE and UE b/l. L pupil constricted on the L compared to the R. Minimally reactive pupils b/l.   Laboratory:  Recent Labs Lab 12/15/14 0505 12/16/14 0211 12/17/14 0228  WBC 4.7 6.8 6.5  HGB 10.9* 10.0* 10.5*  HCT 41.3 37.0* 37.7*  PLT 99* 101* 105*     Recent Labs Lab 12/14/14 1950  12/15/14 0505 12/16/14 0211 12/17/14 0228  NA  --   < > 138 141 137  K  --   < > 5.0 4.7 4.5  CL  --   < > 94* 92* 82*  CO2  --   --  32 39* 47*  BUN  --   < > 35* 41* 41*  CREATININE  --   < > 1.40* 1.42* 1.43*  CALCIUM  --   --  8.5* 9.3 9.4  PROT 6.9  --   --   --   --   BILITOT 1.2  --   --   --   --   ALKPHOS 56  --   --   --   --   ALT 10*  --   --   --   --   AST 19  --   --   --   --   GLUCOSE  --   < > 199* 205* 355*  < > = values in this interval not displayed.Lactic acid: 2.33>2.5>3.2 >2.2  ABG    Component Value Date/Time   PHART 7.402 12/16/2014 0409   PCO2ART 65.3* 12/16/2014 0409   PO2ART 90.0 12/16/2014 0409   HCO3 39.8* 12/16/2014 0409   TCO2  41.8 12/16/2014 0409   O2SAT 96.8 12/16/2014 0409    Risk Stratification Labs  TSH    Component Value Date/Time   TSH 2.29 12/09/2012 1030   Hemoglobin A1C    Component Value Date/Time   HGBA1C 7.1* 12/15/2014 0505   Lipid Panel     Component Value Date/Time   CHOL 89 12/15/2014 0245   TRIG 60 12/15/2014 0245   HDL 42 12/15/2014 0245   CHOLHDL 2.1 12/15/2014 0245   VLDL 12 12/15/2014 0245   LDLCALC 35 12/15/2014 0245    Imaging/Diagnostic Tests: CXR: Chronic bronchitic changes. No definite infiltrates. Possible small effusions.  CXR: Developing mild interstitial edema. Slight increase in bibasilar atelectasis. Probable small right pleural effusion.  CXR 6/17: Mild persistent CHF.  CT head: Atrophy with small vessel chronic ischemic changes of deep cerebral white matter.No acute intracranial abnormalities.  CXR 6/18: Bibasilar airspace opacities may reflect pneumonia or pulmonary edema, similar in appearance to the prior study. Underlying vascular congestion noted.  Archie Patten, MD 12/17/2014, 9:25 AM PGY-1, [facility] Intern pager: [phone], text pages welcome

## 2014-12-17 NOTE — Discharge Summary (Signed)
[facility] Discharge Summary  Patient name: James Moon Medical record number: [id number] Date of birth: 1945-07-28 Age: 69 y.o. Gender: male Date of Admission: 12/14/2014  Date of Discharge: 12/22/2014 Admitting Physician: Alveda Reasons, MD  Primary Care Provider: Lamar Blinks, MD Consultants: Cardiology, PCCM, Palliative  Indication for Hospitalization: Acute respiratory failure  Discharge Diagnoses/Problem List:  Acute on chronic respiratory failure  Respiratory acidosis  Hypercapnia  CHF Pulmonary HTN COPD CAD  T2DM  Tobacco use  Left leg full thickness ulceration   Disposition: Discharge to SNF  Discharge Condition: Stable, improved  Discharge Exam:  Blood pressure 105/54, pulse 70, temperature 97.5 F (36.4 C), temperature source Oral, resp. rate 16, height 5\' 10"  (1.778 m), weight 131 lb 6.4 oz (59.603 kg), SpO2 95 %. General: sitting in bed eating in NAD  Cardiovascular: RRR. No m/r/g noted. No LE edema b/l. Respiratory: No increased WOB. CTAB. No wheezing or rhonchi noted. Currently comfortable on 5L [place] sleeping. Abdomen: +BS, soft, ND/NT Extremities: Chronic venous stasis changes to the LEs b/l. 4x2cm full thickness ulcer on left lower extremity with minimal yellow drainage, covered by foam dressing.  Neuro: Alert&Oriented. No gross deficits. R pupil slightly smaller than L, this is chronic. Reactive to light  Brief Hospital Course:  James Moon is a 69 y/o male who presented to the ED after his daughter found him at home without his home oxygen on with confusion and intermittent unresponsiveness consistent with his previous respiratory failure episodes. In the ED he received solu-medrol 125, duoneb x1, 250cc NS bolus, and was placed on BiPAP. Initial ABG showed a respiratory acidosis with pH 7.155, pCO2 100, pO2 319, bicarb 34 that improved slightly to pH 7.242, pCO2 82, and pO2 88.9 with BiPAP.He also had a lactic acid of  2.33.  CXR showed chronic bronchitic changes with no infiltrates or edema  He was admitted to the SDU. He was given Lasix 20mg  IV, started on prednisone and Levaquin, and troponins were trended. The subsequent day, he was given a 500cc bolus due to increasing lactic acidosis up to 3.2, AKI, and appearing dry on exam.  His ABGs did not improved overnight with BiPAP, therefore CCM and cardiology were consulted. On repeat exam, the patient was noted to have coarse crackles and 2+ pitting edema bilaterally. CXR revealed a developing mild interstitial edema. He was diuresed with Lasix 80mg  IV BID. With scheduled breathing treatments, prednisone, Levaquin, and diuresis, he was eventually weaned to [place] on 6/17.  Unfortunately, he was unable to be weaned down to his home 2L [place]. He was stable on 3L [place], however was noted to desaturate with movement, therefore supplemental O2 was increased with PT.  Given the patient's severe COPD, CHF, and pulmonary HTN with continued tobacco use, palliative care was consulted to discuss goals of care.  It was ultimately decided to become DNI and the daughter was given MOST forms to consider further goals of care.   Issues for Follow Up:  1. Continue to discuss Hospice as an option for the patient, if family is interested in the future.  2. Will need the addition of ACE-i if BPs can tolerate  3. Patient will need to be on prolonged prednisone taper down to 10mg , decreasing to 10mg  q 4 days. 4. Patient will f/u with pulmonology concerning steroids and COPD treatment. 5. Patient discharged with Lasix 40mg  every other day , was on 40mg  BID. Monitor SCr and fluid status and consider increasing back to previous home  regimen. Also monitor SCr.  6. Patient hypoglycemic on admission with Lantus 15u (no 70/30). He was discharged home with Lantus 10u. Consider increasing appropriately.  7. Patient may not tolerate aggressive PT secondary to decreased O2 saturations even while on nasal canula,  however he is very motivated.  8.  Significant Procedures: None  Significant Labs and Imaging:   Recent Labs Lab 12/17/14 0228 12/18/14 0312 12/19/14 0651  WBC 6.5 8.7 8.5  HGB 10.5* 12.2* 12.1*  HCT 37.7* 42.7 41.2  PLT 105* 122* 108*    Recent Labs Lab 12/17/14 0228 12/18/14 0312 12/19/14 0651 12/21/14 1159 12/22/14 0641  NA 137 138 138 143 142  K 4.5 5.1 4.4 4.2 4.1  CL 82* 82* 82* 86* 84*  CO2 47* 43* 45* 45* 45*  GLUCOSE 355* 293* 213* 158* 219*  BUN 41* 47* 70* 95* 101*  CREATININE 1.43* 1.51* 1.98* 2.53* 2.56*  CALCIUM 9.4 9.7 8.8* 8.5* 8.8*  MG  --   --   --   --  2.5*    Lactic acid: 2.33>2.5>3.2 >2.2  Blood culture: Negative   Risk Stratification Labs  TSH    Component Value Date/Time   TSH 2.29 12/09/2012 1030   Hemoglobin A1C    Component Value Date/Time   HGBA1C 7.1* 12/15/2014 0505   Lipid Panel     Component Value Date/Time   CHOL 89 12/15/2014 0245   TRIG 60 12/15/2014 0245   HDL 42 12/15/2014 0245   CHOLHDL 2.1 12/15/2014 0245   VLDL 12 12/15/2014 0245   LDLCALC 35 12/15/2014 0245   CXR: Chronic bronchitic changes. No definite infiltrates. Possible small effusions.  CXR: Developing mild interstitial edema. Slight increase in bibasilar atelectasis. Probable small right pleural effusion.  CXR 6/17: Mild persistent CHF.  CT head: Atrophy with small vessel chronic ischemic changes of deep cerebral white matter.No acute intracranial abnormalities.  CXR 6/18: Bibasilar airspace opacities may reflect pneumonia or pulmonary edema, similar in appearance to the prior study. Underlying vascular congestion noted.  Results/Tests Pending at Time of Discharge: None   Discharge Medications:    Medication List    STOP taking these medications        doxycycline 100 MG tablet  Commonly known as:  VIBRA-TABS     insulin aspart 100 UNIT/ML injection  Commonly known as:  novoLOG     insulin NPH-regular Human (70-30) 100 UNIT/ML  injection  Commonly known as:  NOVOLIN 70/30     metoprolol succinate 25 MG 24 hr tablet  Commonly known as:  TOPROL-XL      TAKE these medications        aspirin EC 81 MG tablet  Take 81 mg by mouth daily.     atorvastatin 40 MG tablet  Commonly known as:  LIPITOR  TAKE ONE TABLET BY MOUTH ONCE DAILY.     clopidogrel 75 MG tablet  Commonly known as:  PLAVIX  Take 1 tablet (75 mg total) by mouth daily.     collagenase ointment  Commonly known as:  SANTYL  Apply topically daily.     furosemide 40 MG tablet  Commonly known as:  LASIX  Take 1 tablet (40 mg total) by mouth every other day.     Insulin Glargine 100 UNIT/ML Solostar Pen  Commonly known as:  LANTUS SOLOSTAR  Inject 10 Units into the skin daily at 10 pm.     ipratropium-albuterol 0.5-2.5 (3) MG/3ML Soln  Commonly known as:  DUONEB  Take 3 mLs  by nebulization every 6 (six) hours as needed.     metoprolol tartrate 25 MG tablet  Commonly known as:  LOPRESSOR  Take 0.5 tablets (12.5 mg total) by mouth 2 (two) times daily.     NEEDLE (DISP) 22 G 22G X 3/4" Misc  Commonly known as:  B-D DISP NEEDLE 22GX3/4"  1 Syringe by Does not apply route daily.     nitroGLYCERIN 0.4 MG SL tablet  Commonly known as:  NITROSTAT  Place 1 tablet (0.4 mg total) under the tongue every 5 (five) minutes as needed for chest pain. For chest pain     predniSONE 10 MG tablet  Commonly known as:  DELTASONE  Starting 12/23/2014, take 30mg  (3 pills) for 3 days; then 20mg  (2 pills) for 4 days; then 10mg  (1 pill) daily     RELION INSULIN SYR 0.3ML/31G 31G X 5/16" 0.3 ML Misc  Generic drug:  Insulin Syringe-Needle U-100  USE THREE TIMES DAILY     SYMBICORT 160-4.5 MCG/ACT inhaler  Generic drug:  budesonide-formoterol  INHALE TWO PUFFS BY MOUTH TWICE DAILY        Discharge Instructions: Please refer to Patient Instructions section of EMR for full details.  Patient was counseled important signs and symptoms that should prompt return  to medical care, changes in medications, dietary instructions, activity restrictions, and follow up appointments.   Follow-Up Appointments: Follow-up Information    Follow up with Christinia Gully, MD On 01/06/2015.   Specialty:  Pulmonary Disease   Why:  at 10:15am    Contact information:   [address] [place] [address] [phone]       Crystal S Dorsey, MD 12/22/2014, 2:14 PM PGY-1, [facility]

## 2014-12-17 NOTE — Progress Notes (Signed)
Report received from [place] , Therapist, sports from [place] [facility] Awaiting pt's arrival.

## 2014-12-17 NOTE — Progress Notes (Signed)
Adjusted pt's condom catheter. James Moon is soiled but pt is refusing gown change at this time. Will continue to monitor.

## 2014-12-18 DIAGNOSIS — J9602 Acute respiratory failure with hypercapnia: Secondary | ICD-10-CM

## 2014-12-18 DIAGNOSIS — J9620 Acute and chronic respiratory failure, unspecified whether with hypoxia or hypercapnia: Secondary | ICD-10-CM

## 2014-12-18 LAB — BASIC METABOLIC PANEL
ANION GAP: 13 (ref 5–15)
BUN: 47 mg/dL — AB (ref 6–20)
CO2: 43 mmol/L — ABNORMAL HIGH (ref 22–32)
Calcium: 9.7 mg/dL (ref 8.9–10.3)
Chloride: 82 mmol/L — ABNORMAL LOW (ref 101–111)
Creatinine, Ser: 1.51 mg/dL — ABNORMAL HIGH (ref 0.61–1.24)
GFR calc non Af Amer: 45 mL/min — ABNORMAL LOW (ref >60)
GFR, EST AFRICAN AMERICAN: 53 mL/min — AB (ref >60)
Glucose, Bld: 293 mg/dL — ABNORMAL HIGH (ref 65–99)
POTASSIUM: 5.1 mmol/L (ref 3.5–5.1)
Sodium: 138 mmol/L (ref 135–145)

## 2014-12-18 LAB — CBC
HCT: 42.7 % (ref 39.0–52.0)
HEMOGLOBIN: 12.2 g/dL — AB (ref 13.0–17.0)
MCH: 23.6 pg — ABNORMAL LOW (ref 26.0–34.0)
MCHC: 28.6 g/dL — ABNORMAL LOW (ref 30.0–36.0)
MCV: 82.6 fL (ref 78.0–100.0)
Platelets: 122 10*3/uL — ABNORMAL LOW (ref 150–400)
RBC: 5.17 MIL/uL (ref 4.22–5.81)
RDW: 19.8 % — ABNORMAL HIGH (ref 11.5–15.5)
WBC: 8.7 10*3/uL (ref 4.0–10.5)

## 2014-12-18 LAB — GLUCOSE, CAPILLARY
Glucose-Capillary: 263 mg/dL — ABNORMAL HIGH (ref 65–99)
Glucose-Capillary: 180 mg/dL — ABNORMAL HIGH (ref 65–99)
Glucose-Capillary: 177 mg/dL — ABNORMAL HIGH (ref 65–99)
Glucose-Capillary: 197 mg/dL — ABNORMAL HIGH (ref 65–99)

## 2014-12-18 MED ORDER — PREDNISONE 20 MG PO TABS
40.0000 mg | ORAL_TABLET | Freq: Every day | ORAL | Status: DC
  Administered 2014-12-18 – 2014-12-21 (×4): 40 mg via ORAL
  Filled 2014-12-18 (×5): qty 2

## 2014-12-18 NOTE — Progress Notes (Signed)
Rt note: Pt has old bipap order.  No distress at nthis time. Rt will continue to monitor.

## 2014-12-18 NOTE — Progress Notes (Signed)
Family Medicine Teaching Service Daily Progress Note Intern Pager: [phone]  Patient name: James Moon Medical record number: [id number] Date of birth: [birth year] Age: 69 y.o. Gender: male  Primary Care Provider: Lamar Blinks, MD Consultants: CHF, CCM Code Status: partial code - DNI  Pt Overview and Major Events to Date:  6/15: Patient unresponsive in acute respiratory failure requiring BiPAP. Solu-medrol in ED 6/16: continued to be on BiPAP 6/17: Weaned to [place]  Assessment and Plan: James Moon is a 69 y.o. male presenting with AMS and respiratory failure . PMH is significant for COPD on home O2, chronic combined systolic and diastolic CHF, pulmonary HTN T2DM, tobacco abuse.  Acute on chronic respiratory failure, Respiratory acidosis in setting of CHF, pulmonary HTN, COPD: Presenting with altered mental status that has somewhat improved per EDP with BiPAP. Desats to 70s when taken off of BiPAP per ED (though not recorded). Afebrile, BP slightly soft to sBP in 100s, VS otherwise stable. ABG shows respiratory acidosis with pH 7.155, pCO2 100, pO2 319, bicarb 34 that improved slightly to pH 7.242, pCO2 82, and pO2 88.9 with BiPAP in the ED. Initial CXR shows chronic bronchitic changes with no infiltrates or edema. Initial troponin 0.06, EKG with LBBB (possibly new since last EKG), PVC, baseline wander, no ST elevations. Given changes in EKG and elevation in trops: 0.06>0.08>0.08, and significant cardiac history, ACS was ruled out. Additionally, BNP noted to be elevated at 1851 and patient's last EF was noted to be 25-30%, therefore a CHF exacerbation on his already poor cadiopulmonary stated (severe COPD with continued smoking) is the most likely etiology. Repeat CXR on 6/17 revealed mild persistent CHF.  - Patient currently on 3L [place] - prednisone 50mg  to complete 4/5 days. - Levaquin PO 500mg  q24h, D4 - scheduled duonebs spaced to q6hr - CCM c/s: changed symbicort to  budesonide/brovana, continue prednisone, pt not a great candidate for intubation  - cardiology c/s: diurese, this was most likely 2/2 CHF exacerbation given quick turn around. Currently Lasix 80mg  PO BID. - trend lactic acid: 2.33>2.5>3.2>2.2 - blood cultures (after levaquin) NGTD - Given end state COPD, CHF, and pulmonary HTN in the setting of continued smoking, palliative consult: rec SNF and would be candidate for hospice if desired  Altered Mental Status: Likely 2/2 respiratory acidosis with hypercapnia as above.Patient oriented to person, place, and situation. Patient states he tripped over a rocker and fell. Does not recall harming himself or hitting his head. On neurologic exam, L pupil constricted, both minimally reactive to light. CT of head without acute abnormalities.  - Avoid sedating medications  Ischemic cardiomyopathy, CAD s/p CABG, CHF: Last Echo (3/16) with EF 25-30% and akinesis of apex, anterior septum and anterior wall.Has AICD in place. Appears euvolemic on exam - Dry weight appears to be 143-145lbs from outpt visits, last 08/2014. On admission was 158lbs.  - home statin, plavix, aspirin AP - restart home metoprolol today: watch HR, BP - does not appear to be on an ACE-i - decreasing diuresis - cardiology following, appreciate recs - daily weights: 158>159>149 >140 > 133 -  Strict I&Os: -1.5L   Concerns for aspiration: Patient's daughter reports choking and coughing when attempting to eat for last few months - SLP with no concerns for aspiration.  T2DM: Last A1c 10.5 (08/30/14). On SSI, 70/30 and Lantus at home per med rec. - monitor CBGs: 274-294 - sensitive SSI - 17 units yesterday - holding home 70/30 - will increase Lantus from 15 to home 20 units  qhs  Tobacco abuse: Daughter reports he smokes at least 1 PPD - Nicotine patch 21mg  - tobacco cessation counseling after mental status improves  L leg poorly healing wound, chronic venous stasis: Does not appear  acutely infected. Seen at wound care clinic as OP per daughter. - Wound care c/s for management inpatient - elevate feet  Thrombocytopenia: Stable from previous hospitalizations. One note from 2005 notes possible hematology outpatient follow up, however unsure if this ever occurred. - continue to monitor  FEN/GI: Regular diet, SLIV Prophylaxis: SQ heparin  Disposition: floor status, PT c/s, d/c pending respiratory status - likely later today  Subjective:  Patient with improved breathing. No chest pain or SOB. Wants to go home and states he could stay with his daughter for a while. Not interested in SNF, but may look into a ALF in the future  Objective: Temp:  [97.8 F (36.6 C)-98.4 F (36.9 C)] 98 F (36.7 C) (06/19 0500) Pulse Rate:  [76-93] 76 (06/19 0500) Resp:  [15-19] 18 (06/19 0500) BP: (98-123)/(56-62) 98/60 mmHg (06/19 0500) SpO2:  [93 %-100 %] 93 % (06/19 0500) Weight:  [133 lb 6.1 oz (60.5 kg)-133 lb 9.6 oz (60.6 kg)] 133 lb 6.1 oz (60.5 kg) (06/19 0414) Physical Exam: General: sitting in bed, very alert, pleasant. NAD.  Cardiovascular: RRR. No m/r/g noted. Trace edema b/l. Respiratory: No increased WOB. Air movement improved b/l. Intermittent crackles in the bases b/l. No wheezing or rhonchi noted. Currently comfortable on 3L [place] Abdomen: +BS, soft, ND/NT Extremities: Chronic venous stasis changes to the LEs b/l. 4x2cm full thickness ulcer on left lower extremity with minimal yellow drainage, covered by foam dressing. Very dry, scaly skin Neuro: Alert. Oriented to person, place, time, and situation. Facial movements symmetric. 5/5 strength in the LE and UE b/l. L pupil constricted on the L compared to the R. Minimally reactive pupils b/l.   Laboratory:  Recent Labs Lab 12/16/14 0211 12/17/14 0228 12/18/14 0312  WBC 6.8 6.5 8.7  HGB 10.0* 10.5* 12.2*  HCT 37.0* 37.7* 42.7  PLT 101* 105* 122*    Recent Labs Lab 12/14/14 1950  12/16/14 0211 12/17/14 0228  12/18/14 0312  NA  --   < > 141 137 138  K  --   < > 4.7 4.5 5.1  CL  --   < > 92* 82* 82*  CO2  --   < > 39* 47* 43*  BUN  --   < > 41* 41* 47*  CREATININE  --   < > 1.42* 1.43* 1.51*  CALCIUM  --   < > 9.3 9.4 9.7  PROT 6.9  --   --   --   --   BILITOT 1.2  --   --   --   --   ALKPHOS 56  --   --   --   --   ALT 10*  --   --   --   --   AST 19  --   --   --   --   GLUCOSE  --   < > 205* 355* 293*  < > = values in this interval not displayed.Lactic acid: 2.33>2.5>3.2 >2.2  ABG    Component Value Date/Time   PHART 7.402 12/16/2014 0409   PCO2ART 65.3* 12/16/2014 0409   PO2ART 90.0 12/16/2014 0409   HCO3 39.8* 12/16/2014 0409   TCO2 41.8 12/16/2014 0409   O2SAT 96.8 12/16/2014 0409    Risk Stratification Labs  TSH  Component Value Date/Time   TSH 2.29 12/09/2012 1030   Hemoglobin A1C    Component Value Date/Time   HGBA1C 7.1* 12/15/2014 0505   Lipid Panel     Component Value Date/Time   CHOL 89 12/15/2014 0245   TRIG 60 12/15/2014 0245   HDL 42 12/15/2014 0245   CHOLHDL 2.1 12/15/2014 0245   VLDL 12 12/15/2014 0245   LDLCALC 35 12/15/2014 0245    Imaging/Diagnostic Tests: CXR: Chronic bronchitic changes. No definite infiltrates. Possible small effusions.  CXR: Developing mild interstitial edema. Slight increase in bibasilar atelectasis. Probable small right pleural effusion.  CXR 6/17: Mild persistent CHF.  CT head: Atrophy with small vessel chronic ischemic changes of deep cerebral white matter.No acute intracranial abnormalities.  CXR 6/18: Bibasilar airspace opacities may reflect pneumonia or pulmonary edema, similar in appearance to the prior study. Underlying vascular congestion noted.  Virginia Crews, MD 12/18/2014, 8:02 AM PGY-1, [place] Intern pager: [phone], text pages welcome

## 2014-12-18 NOTE — Progress Notes (Signed)
Patient ID: James Moon, male   DOB: 1945/07/10, 69 y.o.   MRN: [id number]    Patient Name: James Moon Date of Encounter: 12/18/2014     Active Problems:   Respiratory distress   Respiratory failure, acute-on-chronic   Pressure ulcer   Acute respiratory failure with hypoxia and hypercarbia   Acute exacerbation of CHF (congestive heart failure)   Elevated troponin   Somnolence   COPD exacerbation   Altered mental state   Palliative care encounter   Acute respiratory failure   Pulmonary edema with congestive heart failure with reduced left ventricular function    SUBJECTIVE  Dyspnea appears to be back to baseline. Denies chest pain or sob.  CURRENT MEDS . antiseptic oral rinse  7 mL Mouth Rinse BID  . arformoterol  15 mcg Nebulization BID  . aspirin EC  81 mg Oral Daily  . atorvastatin  40 mg Oral q1800  . budesonide (PULMICORT) nebulizer solution  0.5 mg Nebulization BID  . Chlorhexidine Gluconate Cloth  6 each Topical Q0600  . clopidogrel  75 mg Oral Daily  . docusate sodium  100 mg Oral BID  . furosemide  80 mg Oral BID  . heparin subcutaneous  5,000 Units Subcutaneous 3 times per day  . insulin aspart  0-5 Units Subcutaneous QHS  . insulin aspart  0-9 Units Subcutaneous TID WC  . insulin glargine  15 Units Subcutaneous Q2200  . levofloxacin  250 mg Oral QHS  . metoprolol tartrate  12.5 mg Oral BID  . mupirocin ointment  1 application Nasal BID  . nicotine  21 mg Transdermal Daily  . predniSONE  50 mg Oral Q breakfast  . sodium chloride  3 mL Intravenous Q12H    OBJECTIVE  Filed Vitals:   12/18/14 0018 12/18/14 0414 12/18/14 0500 12/18/14 0930  BP:   98/60   Pulse:   76   Temp:   98 F (36.7 C)   TempSrc:   Oral   Resp:   18   Height: 5\' 10"  (1.778 m)     Weight: 133 lb 9.6 oz (60.6 kg) 133 lb 6.1 oz (60.5 kg)    SpO2:   93% 95%    Intake/Output Summary (Last 24 hours) at 12/18/14 1031 Last data filed at 12/18/14 0634  Gross per 24 hour    Intake    240 ml  Output   1701 ml  Net  -1461 ml   Filed Weights   12/17/14 0414 12/18/14 0018 12/18/14 0414  Weight: 140 lb 6.9 oz (63.7 kg) 133 lb 9.6 oz (60.6 kg) 133 lb 6.1 oz (60.5 kg)    PHYSICAL EXAM  General: Pleasant, chronically ill appearing, NAD. Neuro: Alert and oriented X 3. Moves all extremities spontaneously. Psych: Normal affect. HEENT:  Normal  Neck: Supple without bruits or JVD. Lungs:  Resp regular and unlabored, CTA but decreased breath sounds. Heart: RRR no s3, s4, or murmurs. Abdomen: Soft, non-tender, non-distended, BS + x 4.  Extremities: bilateral resolving woody edema.  Accessory Clinical Findings  CBC  Recent Labs  12/17/14 0228 12/18/14 0312  WBC 6.5 8.7  HGB 10.5* 12.2*  HCT 37.7* 42.7  MCV 82.7 82.6  PLT 105* 427*   Basic Metabolic Panel  Recent Labs  12/17/14 0228 12/18/14 0312  NA 137 138  K 4.5 5.1  CL 82* 82*  CO2 47* 43*  GLUCOSE 355* 293*  BUN 41* 47*  CREATININE 1.43* 1.51*  CALCIUM 9.4 9.7  Liver Function Tests No results for input(s): AST, ALT, ALKPHOS, BILITOT, PROT, ALBUMIN in the last 72 hours. No results for input(s): LIPASE, AMYLASE in the last 72 hours. Cardiac Enzymes No results for input(s): CKTOTAL, CKMB, CKMBINDEX, TROPONINI in the last 72 hours. BNP Invalid input(s): POCBNP D-Dimer No results for input(s): DDIMER in the last 72 hours. Hemoglobin A1C No results for input(s): HGBA1C in the last 72 hours. Fasting Lipid Panel No results for input(s): CHOL, HDL, LDLCALC, TRIG, CHOLHDL, LDLDIRECT in the last 72 hours. Thyroid Function Tests No results for input(s): TSH, T4TOTAL, T3FREE, THYROIDAB in the last 72 hours.  Invalid input(s): FREET3  TELE  nsr with pac's  Radiology/Studies  Ct Head Wo Contrast  12/16/2014   CLINICAL DATA:  Confusion, fall, hypoxia, history colon cancer, CHF, diabetes mellitus, hypertension, coronary artery disease, ischemic cardiomyopathy, smoker, COPD  EXAM: CT HEAD  WITHOUT CONTRAST  TECHNIQUE: Contiguous axial images were obtained from the base of the skull through the vertex without intravenous contrast.  COMPARISON:  04/21/2013  FINDINGS: Generalized atrophy.  Normal ventricular morphology.  No midline shift or mass effect.  Small vessel chronic ischemic changes of deep cerebral white matter.  No intracranial hemorrhage, mass lesion, or acute infarction.  Visualized paranasal sinuses and mastoid air cells clear.  Bones unremarkable.  Significant atherosclerotic calcifications of the internal carotid and vertebral arteries at skullbase.  IMPRESSION: Atrophy with small vessel chronic ischemic changes of deep cerebral white matter.  No acute intracranial abnormalities.   Electronically Signed   By: Lavonia Dana M.D.   On: 12/16/2014 13:46   Dg Chest Port 1 View  12/17/2014   CLINICAL DATA:  Acute onset of respiratory failure. Initial encounter.  EXAM: PORTABLE CHEST - 1 VIEW  COMPARISON:  Chest radiograph performed 12/16/2014  FINDINGS: The lungs are well expanded. Bibasilar airspace opacities may reflect pneumonia or pulmonary edema, similar in appearance to the prior study. Underlying vascular congestion is noted. The left costophrenic angle is incompletely imaged on this study. No definite pleural effusion or pneumothorax is seen.  The cardiomediastinal silhouette is borderline normal in size. The patient is status post median sternotomy, with evidence of prior CABG. An AICD is noted overlying the left chest wall, with a single lead ending overlying the right ventricle. No acute osseous abnormalities are seen.  IMPRESSION: Bibasilar airspace opacities may reflect pneumonia or pulmonary edema, similar in appearance to the prior study. Underlying vascular congestion noted.   Electronically Signed   By: Garald Balding M.D.   On: 12/17/2014 09:23   Dg Chest Port 1 View  12/16/2014   CLINICAL DATA:  Acute on chronic respiratory failure, pulmonary edema with CHF and reduced  LEFT ventricular function, history hypertension, ischemic cardiomyopathy, diabetes mellitus, colon cancer, smoking, COPD, pulmonary hypertension  EXAM: PORTABLE CHEST - 1 VIEW  COMPARISON:  Portable exam 1011 hours compared 12/15/2014  FINDINGS: LEFT subclavian AICD lead tip projects over RIGHT ventricle.  Borderline enlargement of cardiac silhouette post CABG.  Slight pulmonary vascular congestion.  Mild perihilar infiltrates likely pulmonary edema, little changed.  Small RIGHT pleural effusion.  No pneumothorax.  IMPRESSION: Mild persistent CHF.   Electronically Signed   By: Lavonia Dana M.D.   On: 12/16/2014 10:20   Dg Chest Port 1 View  12/15/2014   CLINICAL DATA:  Respiratory failure, acute on chronic.  EXAM: PORTABLE CHEST - 1 VIEW  COMPARISON:  1 day prior  FINDINGS: Pacer/AICD with tip at right ventricle. Patient rotated left. Prior median sternotomy.  Midline trachea. Mild cardiomegaly. Trace right pleural fluid. No pneumothorax. Skin fold over the left hemi thorax laterally. New or increased pulmonary interstitial prominence and indistinctness. Patchy bibasilar airspace disease.  IMPRESSION: Developing mild interstitial edema.  Slight increase in bibasilar atelectasis.  Probable small right pleural effusion.   Electronically Signed   By: Abigail Miyamoto M.D.   On: 12/15/2014 09:10   Dg Chest Portable 1 View  12/14/2014   CLINICAL DATA:  Shortness of breath today.  EXAM: PORTABLE CHEST - 1 VIEW  COMPARISON:  09/02/2014  FINDINGS: The heart is borderline enlarged but stable. Stable tortuosity and calcification of the thoracic aorta. Stable surgical changes from bypass surgery. Coronary artery stents are noted. The right ventricular pacer wire is stable. The lungs are clear. Chronic bronchitic changes but no infiltrates or edema. Possible small effusions.  IMPRESSION: Chronic bronchitic changes.  No definite infiltrates.  Possible small effusions.   Electronically Signed   By: Marijo Sanes M.D.   On:  12/14/2014 20:20    ASSESSMENT AND PLAN  1. Acute on chronic systolic heart failure 2. Underlying severe COPD with chronic Co2 retention based on ABG 3. Longstanding tobacco abuse, states he stopped 4 days prior to admit Rec: from a heart failure treatment perspective, he is close to being ready for discharge. Spoke to his daughter today and she is concerned about his ability to live at home alone. Consider discharge to SNF would be appropriate at this point.   Gregg Taylor,M.D.  12/18/2014 10:31 AM

## 2014-12-18 NOTE — Progress Notes (Signed)
PULMONARY / CRITICAL CARE MEDICINE   Name: James Moon MRN: [id number] DOB: [birth year]    ADMISSION DATE:  12/14/2014 CONSULTATION DATE:  12/15/14   REFERRING MD :  Dr. Mingo Amber / [facility]   CHIEF COMPLAINT:  Acute on Chronic Respiratory Failure   INITIAL PRESENTATION: 69 y/o M, smoker, admitted on 6/15 with acute on chronic respiratory failure in the setting of suspected CHF exacerbation and confusion.  Found to have hypercarbic respiratory failure and placed on bipap support.  PCCM consulted for evaluation 6/16.    STUDIES:  6/16  CXR >> chronic bronchitic changes, small bilateral effusions  SIGNIFICANT EVENTS: 6/15  Admit with SOB, acute on chronic resp fx, confusion/lethargy, LE swelling. 6/16  Mild agitation overnight, resolved by am.   6/17  50% VM, mentation much improved 6/18 5L [place] sats better , off bipap     SUBJECTIVE: Pt remains on nasal cannula.  Not on bipap.  VITAL SIGNS: Temp:  [97.8 F (36.6 C)-98.4 F (36.9 C)] 98 F (36.7 C) (06/19 0500) Pulse Rate:  [76-93] 76 (06/19 0500) Resp:  [15-19] 18 (06/19 0500) BP: (98-123)/(56-62) 98/60 mmHg (06/19 0500) SpO2:  [93 %-100 %] 95 % (06/19 0930) Weight:  [60.5 kg (133 lb 6.1 oz)-60.6 kg (133 lb 9.6 oz)] 60.5 kg (133 lb 6.1 oz) (06/19 0414)   VENTILATOR SETTINGS:     INTAKE / OUTPUT:  Intake/Output Summary (Last 24 hours) at 12/18/14 1043 Last data filed at 12/18/14 3976  Gross per 24 hour  Intake    240 ml  Output   1701 ml  Net  -1461 ml    PHYSICAL EXAMINATION: General:  Chronically ill adult male in NAD Neuro:  Awake, alert, oriented.  MAE.  L pupil 33mm, R 58mm (pt states is normal) HEENT:  MM pink/moist, JVD+ Cardiovascular:  s1s2 rrr, distant tones  Lungs:  resp's even/non-labored, lungs bilaterally diminished but improved air movement compared to 6/16  Abdomen:  NTND, bsx4 active  Musculoskeletal:  No acute deformities  Skin:  Thin skin, scattered bruises, BLE's with chronic venous stasis, odor,  LLE venous wound  LABS:  CBC  Recent Labs Lab 12/16/14 0211 12/17/14 0228 12/18/14 0312  WBC 6.8 6.5 8.7  HGB 10.0* 10.5* 12.2*  HCT 37.0* 37.7* 42.7  PLT 101* 105* 122*   Coag's No results for input(s): APTT, INR in the last 168 hours.   BMET  Recent Labs Lab 12/16/14 0211 12/17/14 0228 12/18/14 0312  NA 141 137 138  K 4.7 4.5 5.1  CL 92* 82* 82*  CO2 39* 47* 43*  BUN 41* 41* 47*  CREATININE 1.42* 1.43* 1.51*  GLUCOSE 205* 355* 293*   Electrolytes  Recent Labs Lab 12/16/14 0211 12/17/14 0228 12/18/14 0312  CALCIUM 9.3 9.4 9.7   Sepsis Markers  Recent Labs Lab 12/15/14 0245 12/15/14 1010 12/15/14 1152  LATICACIDVEN 3.2* 2.0 2.2*   ABG  Recent Labs Lab 12/15/14 1026 12/15/14 1600 12/16/14 0409  PHART 7.282* 7.301* 7.402  PCO2ART 79.7* 76.1* 65.3*  PO2ART 72.0* 92.6 90.0   Liver Enzymes  Recent Labs Lab 12/14/14 1950  AST 19  ALT 10*  ALKPHOS 56  BILITOT 1.2  ALBUMIN 3.3*   Cardiac Enzymes  Recent Labs Lab 12/15/14 0012 12/15/14 0505 12/15/14 1010  TROPONINI 0.08* 0.08* 0.09*   Glucose  Recent Labs Lab 12/16/14 2141 12/17/14 0726 12/17/14 1140 12/17/14 1615 12/17/14 2148 12/18/14 0813  GLUCAP 375* 334* 274* 197* 294* 263*    Imaging No results found.  ASSESSMENT / PLAN:  PULMONARY OETT A: Acute Hypercarbic on Chronic Respiratory Failure>>improved Severe O2 Dependent COPD Former [facility] office pt, last seen 2012.  PFTs 2013: FeV1 50% Gold C, suspect much  worse now with ongoing tobacco use Small Pleural Effusions  Tobacco Abuse, says he has quit, will need ongoing support.  Pulmonary edema >>improved  P:   Now off bipap apprec pall care, pt agrees to DNI Cont  budesonide/brovana while inpatient  Lasix per Cardiology  Taper prednisone to 40mg  /d and slow taper to 10mg  per day by way of reduction by 10mg  every 4 days Oxygen titration, will need home oxygen  CARDIOVASCULAR CVL A:  Acute on Chronic  Systolic / Diastolic Heart Failure Sinus Tachycardia Mild Troponin Leak  CAD s/p CABG P:  Tele monitoring Cardiology consulted, appreciate input  Trend troponin, EKG Daily weights  Beta blocker per cards  RENAL A:   Mild Hyperkalemia resolved AKI resolved  P:   Monitor BMP / UOP  Replace electrolytes as indicated   GASTROINTESTINAL A:   Protein Calorie Malnutrition - mild, in setting of chronic disease Possible Dysphagia  P:   Heart healthy diet as tolerated otherwise     INFECTIOUS A:   AECOPD P:   BCx2 6/16 >> neg  Levaquin, start date 6/16, day 5/5  D/c ABX 6/19  ENDOCRINE A:   DM P:   SSI   NEUROLOGIC A:   Metabolic Encephalopathy -resolved,  CT head NEG Severe debility, lives alone P:  PT eval Agree with snf placement   FAMILY  - Updates: i updated daughter 6/19  apprec pall care help, now dni.    On d/c needs outpt f/u with Dr Selinda Orion in [place] WrightMD Beeper  [phone]  Cell  [phone]  If no response or cell goes to voicemail, call beeper [phone]    12/18/2014, 10:43 AM

## 2014-12-19 DIAGNOSIS — R410 Disorientation, unspecified: Secondary | ICD-10-CM

## 2014-12-19 DIAGNOSIS — R7989 Other specified abnormal findings of blood chemistry: Secondary | ICD-10-CM

## 2014-12-19 LAB — CBC
HCT: 41.2 % (ref 39.0–52.0)
HEMOGLOBIN: 12.1 g/dL — AB (ref 13.0–17.0)
MCH: 23.5 pg — AB (ref 26.0–34.0)
MCHC: 29.4 g/dL — ABNORMAL LOW (ref 30.0–36.0)
MCV: 80.2 fL (ref 78.0–100.0)
Platelets: 108 10*3/uL — ABNORMAL LOW (ref 150–400)
RBC: 5.14 MIL/uL (ref 4.22–5.81)
RDW: 20 % — ABNORMAL HIGH (ref 11.5–15.5)
WBC: 8.5 10*3/uL (ref 4.0–10.5)

## 2014-12-19 LAB — BASIC METABOLIC PANEL
Anion gap: 11 (ref 5–15)
BUN: 70 mg/dL — ABNORMAL HIGH (ref 6–20)
CO2: 45 mmol/L — ABNORMAL HIGH (ref 22–32)
Calcium: 8.8 mg/dL — ABNORMAL LOW (ref 8.9–10.3)
Chloride: 82 mmol/L — ABNORMAL LOW (ref 101–111)
Creatinine, Ser: 1.98 mg/dL — ABNORMAL HIGH (ref 0.61–1.24)
GFR calc non Af Amer: 33 mL/min — ABNORMAL LOW (ref >60)
GFR, EST AFRICAN AMERICAN: 38 mL/min — AB (ref >60)
Glucose, Bld: 213 mg/dL — ABNORMAL HIGH (ref 65–99)
POTASSIUM: 4.4 mmol/L (ref 3.5–5.1)
SODIUM: 138 mmol/L (ref 135–145)

## 2014-12-19 LAB — GLUCOSE, CAPILLARY
GLUCOSE-CAPILLARY: 170 mg/dL — AB (ref 65–99)
GLUCOSE-CAPILLARY: 151 mg/dL — AB (ref 65–99)
Glucose-Capillary: 309 mg/dL — ABNORMAL HIGH (ref 65–99)
Glucose-Capillary: 184 mg/dL — ABNORMAL HIGH (ref 65–99)

## 2014-12-19 MED ORDER — FUROSEMIDE 40 MG PO TABS
40.0000 mg | ORAL_TABLET | Freq: Two times a day (BID) | ORAL | Status: DC
  Filled 2014-12-19 (×2): qty 1

## 2014-12-19 MED ORDER — FUROSEMIDE 40 MG PO TABS
40.0000 mg | ORAL_TABLET | Freq: Two times a day (BID) | ORAL | Status: DC
  Filled 2014-12-19: qty 1

## 2014-12-19 NOTE — Progress Notes (Addendum)
Rehab Admissions Coordinator Note:  Patient was screened by Cleatrice Burke for appropriateness for an Inpatient Acute Rehab Consult per recommendation of PT. Recommend SNF at this time.  Cleatrice Burke 12/19/2014, 10:06 AM  I can be reached at [phone].

## 2014-12-19 NOTE — Progress Notes (Signed)
OT Cancellation Note  Patient Details Name: James Moon MRN: [id number] DOB: Nov 27, 1945   Cancelled Treatment:    Reason Eval/Treat Not Completed: Fatigue/lethargy limiting ability to participate. Pt. Asleep and difficult to arouse.  Once awake he requests "can you just check back later".  Will attempt back as able. Told pt. Today or tomorrow. Will notify other OT staff.  Janice Coffin, COTA/L 12/19/2014, 12:02 PM

## 2014-12-19 NOTE — Progress Notes (Signed)
Patient Name: James Moon Date of Encounter: 12/19/2014  Active Problems:   Respiratory distress   Respiratory failure, acute-on-chronic   Pressure ulcer   Acute respiratory failure with hypoxia and hypercarbia   Acute exacerbation of CHF (congestive heart failure)   Elevated troponin   Somnolence   COPD exacerbation   Altered mental state   Palliative care encounter   Acute respiratory failure   Pulmonary edema with congestive heart failure with reduced left ventricular function    SUBJECTIVE  Patient is sleepy. Answers question appropriately and goes back to sleep. Denies chest pain, sob or palpitation.   CURRENT MEDS . antiseptic oral rinse  7 mL Mouth Rinse BID  . arformoterol  15 mcg Nebulization BID  . aspirin EC  81 mg Oral Daily  . atorvastatin  40 mg Oral q1800  . budesonide (PULMICORT) nebulizer solution  0.5 mg Nebulization BID  . clopidogrel  75 mg Oral Daily  . docusate sodium  100 mg Oral BID  . furosemide  40 mg Oral BID  . heparin subcutaneous  5,000 Units Subcutaneous 3 times per day  . insulin aspart  0-5 Units Subcutaneous QHS  . insulin aspart  0-9 Units Subcutaneous TID WC  . insulin glargine  15 Units Subcutaneous Q2200  . metoprolol tartrate  12.5 mg Oral BID  . mupirocin ointment  1 application Nasal BID  . nicotine  21 mg Transdermal Daily  . predniSONE  40 mg Oral Q breakfast  . sodium chloride  3 mL Intravenous Q12H    OBJECTIVE  Filed Vitals:   12/18/14 2123 12/18/14 2215 12/19/14 0349 12/19/14 0536  BP: 119/62   106/57  Pulse: 88   77  Temp: 98 F (36.7 C)   97.5 F (36.4 C)  TempSrc: Oral   Oral  Resp: 20   18  Height:      Weight:   126 lb 5.2 oz (57.3 kg)   SpO2: 92% 95%  96%    Intake/Output Summary (Last 24 hours) at 12/19/14 0958 Last data filed at 12/19/14 0550  Gross per 24 hour  Intake    130 ml  Output    780 ml  Net   -650 ml   Filed Weights   12/18/14 0018 12/18/14 0414 12/19/14 0349  Weight: 133 lb  9.6 oz (60.6 kg) 133 lb 6.1 oz (60.5 kg) 126 lb 5.2 oz (57.3 kg)    PHYSICAL EXAM  General: Pleasant male laying in bed, NAD. Neuro: Alert and oriented X 3. Moves all extremities spontaneously. Psych: Normal affect. HEENT:  Normal  Neck: Supple without bruits or JVD. Lungs:  Resp regular and unlabored. Diminished breath sound bibasilar.  Heart: RRR no s3, s4, or murmurs. Abdomen: Soft, non-tender, non-distended, BS + x 4.  Extremities: No clubbing, cyanosis or edema. DP/PT/Radials 2+ and equal bilaterally. Chronic venous stasis changes bilateral LE.   Accessory Clinical Findings  CBC  Recent Labs  12/18/14 0312 12/19/14 0651  WBC 8.7 8.5  HGB 12.2* 12.1*  HCT 42.7 41.2  MCV 82.6 80.2  PLT 122* PENDING   Basic Metabolic Panel  Recent Labs  12/18/14 0312 12/19/14 0651  NA 138 138  K 5.1 4.4  CL 82* 82*  CO2 43* 45*  GLUCOSE 293* 213*  BUN 47* 70*  CREATININE 1.51* 1.98*  CALCIUM 9.7 8.8*    TELE  NSR at rate of 70-80s. Occasional PVCs and irregular rate.   Radiology/Studies Echo 08/31/14 LV EF: 25% -  30%  ------------------------------------------------------------------- Indications:   Edema 782.3. Dyspnea 786.09.  ------------------------------------------------------------------- History:  PMH:  Coronary artery disease. Congestive heart failure. Chronic obstructive pulmonary disease. PMH:  Myocardial infarction.  ------------------------------------------------------------------- Study Conclusions  - Left ventricle: Akinesis of apex, anterior septum, and anterior wall. No definite clot seen. The cavity size was normal. Wall thickness was increased in a pattern of mild LVH. Systolic function was severely reduced. The estimated ejection fraction was in the range of 25% to 30%. - Left atrium: The atrium was moderately dilated. - Right ventricle: The cavity size was mildly dilated. Pacer wire or catheter noted in right ventricle.  Systolic function was mildly reduced. - Pulmonary arteries: PA peak pressure: 39 mm Hg (S).  ASSESSMENT AND PLAN James Moon is a 69 y.o. male presenting with AMS and respiratory failure . PMH is significant for COPD on home O2, chronic combined systolic and diastolic CHF, pulmonary HTN T2DM, tobacco abuse.  1. Acute on chronic systolic heart failure - Appears euvolemic.  - Dr. Lovena Le recommended SNF discharge. Palliative care consulted.  - Home dose of Lasix 40mg  po BID. Given 80mg  this AM. Continue plan to switch to home dose from tonight.  - Total I/O negative 6.8L since admission. Weight decreased 158-->126. ? Accurate weight.  - Last Echo (3/16) with EF 25-30% and akinesis of apex, anterior septum and anterior wall. - Continue ASA, plavix, statin, BB - Not on ACE, likely due to low BP  2. CAD s/p CABG - as above  3. Acute on Chronic respiratory failure - Per primary  4. Current tobacco abuse  5. Mild elevated troponin - flat trend. Likely demand. No evidence of ACS  Signed, Reveca Desmarais PA-C Pager [phone]

## 2014-12-19 NOTE — Clinical Social Work Placement (Signed)
   CLINICAL SOCIAL WORK PLACEMENT  NOTE  Date:  12/19/2014  Patient Details  Name: James Moon MRN: [id number] Date of Birth: June 22, 1946  Clinical Social Work is seeking post-discharge placement for this patient at the [place] level of care (*CSW will initial, date and re-position this form in  chart as items are completed):  Yes   Patient/family provided with [facility] Work Department's list of facilities offering this level of care within the geographic area requested by the patient (or if unable, by the patient's family).  Yes   Patient/family informed of their freedom to choose among providers that offer the needed level of care, that participate in Medicare, Medicaid or managed care program needed by the patient, have an available bed and are willing to accept the patient.  Yes   Patient/family informed of [facility]'s ownership interest in [facility] and [facility], as well as of the fact that they are under no obligation to receive care at these facilities.  PASRR submitted to EDS on 12/19/14     PASRR number received on 12/19/14     Existing PASRR number confirmed on       FL2 transmitted to all facilities in geographic area requested by pt/family on 12/19/14     FL2 transmitted to all facilities within larger geographic area on       Patient informed that his/her managed care company has contracts with or will negotiate with certain facilities, including the following:            Patient/family informed of bed offers received.  Patient chooses bed at       Physician recommends and patient chooses bed at      Patient to be transferred to   on  .  Patient to be transferred to facility by       Patient family notified on   of transfer.  Name of family member notified:        PHYSICIAN       Additional Comment:    _______________________________________________ Rigoberto Noel, LCSW 12/19/2014, 2:19 PM

## 2014-12-19 NOTE — Progress Notes (Signed)
Family Medicine Teaching Service Daily Progress Note Intern Pager: [phone]  Patient name: James Moon Medical record number: [id number] Date of birth: 08-16-1945 Age: 69 y.o. Gender: male  Primary Care Provider: Lamar Blinks, MD Consultants: CHF, CCM Code Status: partial code - DNI  Pt Overview and Major Events to Date:  6/15: Patient unresponsive in acute respiratory failure requiring BiPAP. Solu-medrol in ED 6/16: continued to be on BiPAP 6/17: Weaned to [place]  Assessment and Plan: James Moon is a 69 y.o. male presenting with AMS and respiratory failure . PMH is significant for COPD on home O2, chronic combined systolic and diastolic CHF, pulmonary HTN T2DM, tobacco abuse.  Acute on chronic respiratory failure, Respiratory acidosis in setting of CHF, pulmonary HTN, COPD: Presenting with altered mental status that has somewhat improved per EDP with BiPAP. Desats to 70s when taken off of BiPAP per ED (though not recorded). Afebrile, BP slightly soft to sBP in 100s, VS otherwise stable. ABG shows respiratory acidosis with pH 7.155, pCO2 100, pO2 319, bicarb 34 that improved slightly to pH 7.242, pCO2 82, and pO2 88.9 with BiPAP in the ED. Initial CXR shows chronic bronchitic changes with no infiltrates or edema. Initial troponin 0.06, EKG with LBBB (possibly new since last EKG), PVC, baseline wander, no ST elevations. Given changes in EKG and elevation in trops: 0.06>0.08>0.08, and significant cardiac history, ACS was ruled out. Additionally, BNP noted to be elevated at 1851 and patient's last EF was noted to be 25-30%, therefore a CHF exacerbation on his already poor cadiopulmonary stated (severe COPD with continued smoking) is the most likely etiology. Repeat CXR on 6/17 revealed mild persistent CHF and it was stable on 6/18. - Patient desatted to 85% and required 8L [place] with PT this AM. PT suggested some sort of rehab placement. - Patient currently on 6L [place] satting 94% after PT.   - prednisone 50mg  to complete 5/5 days. - Levaquin PO 500mg  q24h, D5/5 - PRN duonebs q4hr - CCM c/s: symbicort to budesonide/brovana while inpt, continue prednisone, pt not a great candidate for intubation  - cardiology c/s: diurese, this was most likely 2/2 CHF exacerbation given quick turn around. Currently Lasix 80mg  PO BID. - trend lactic acid: 2.33>2.5>3.2>2.2 - blood cultures (after levaquin) NGTD - Given end state COPD, CHF, and pulmonary HTN in the setting of continued smoking, palliative consult: rec SNF and would be candidate for hospice if desired  Altered Mental Status: Likely 2/2 respiratory acidosis with hypercapnia as above.Patient oriented to person, place, and situation. Patient states he tripped over a rocker and fell. Does not recall harming himself or hitting his head. On neurologic exam, L pupil constricted, both minimally reactive to light. CT of head without acute abnormalities.  - Avoid sedating medications  Ischemic cardiomyopathy, CAD s/p CABG, CHF: Last Echo (3/16) with EF 25-30% and akinesis of apex, anterior septum and anterior wall.Has AICD in place. Appears euvolemic on exam - Dry weight appears to be 143-145lbs from outpt visits, last 08/2014. On admission was 158lbs.  - home statin, plavix, aspirin AP - restart home metoprolol today: watch HR, BP - does not appear to be on an ACE-i - decreasing to home lasix 40mg  PO BID (pt appears well below his dry weight).  - cardiology following, appreciate recs - daily weights: 158>159>149 >140 > 133 >126 -  Strict I&Os: -650cc   Concerns for aspiration: Patient's daughter reports choking and coughing when attempting to eat for last few months - SLP with no concerns  for aspiration.  T2DM: Last A1c 10.5 (08/30/14). On SSI, 70/30 and Lantus at home per med rec. - monitor CBGs: 177-309 - sensitive SSI - 16 units yesterday - holding home 70/30 - will increase Lantus from 15 to home 20 units qhs  Tobacco abuse:  Daughter reports he smokes at least 1 PPD - Nicotine patch 21mg  - tobacco cessation counseling after mental status improves  L leg poorly healing wound, chronic venous stasis: Does not appear acutely infected. Seen at wound care clinic as OP per daughter. - Wound care c/s for management inpatient - elevate feet  Thrombocytopenia: Stable from previous hospitalizations. One note from 2005 notes possible hematology outpatient follow up, however unsure if this ever occurred. - continue to monitor  FEN/GI: Regular diet, SLIV Prophylaxis: SQ heparin  Disposition: floor status, PT c/s, d/c pending respiratory status - likely later today  Subjective:  Patient with SOB with ambulating. Eating/drinking well. No pain.  Objective: Temp:  [97.5 F (36.4 C)-98.2 F (36.8 C)] 97.5 F (36.4 C) (06/20 0536) Pulse Rate:  [77-88] 77 (06/20 0536) Resp:  [18-22] 18 (06/20 0536) BP: (103-119)/(53-62) 106/57 mmHg (06/20 0536) SpO2:  [91 %-96 %] 96 % (06/20 0536) Weight:  [126 lb 5.2 oz (57.3 kg)] 126 lb 5.2 oz (57.3 kg) (06/20 0349) Physical Exam: General: sitting in bed, smiling, in NAD   Cardiovascular: RRR. No m/r/g noted. Trace edema b/l. Respiratory: No increased WOB. Intermittent crackles in the bases b/l. No wheezing or rhonchi noted. Currently comfortable on 6L [place] Abdomen: +BS, soft, ND/NT Extremities: Chronic venous stasis changes to the LEs b/l. 4x2cm full thickness ulcer on left lower extremity with minimal yellow drainage, covered by foam dressing. Very dry, scaly skin Neuro: Alert. Oriented to person, place, time, and situation. Facial movements symmetric. 5/5 strength in the LE and UE b/l. L pupil constricted on the L compared to the R. Minimally reactive pupils b/l.   Laboratory:  Recent Labs Lab 12/16/14 0211 12/17/14 0228 12/18/14 0312  WBC 6.8 6.5 8.7  HGB 10.0* 10.5* 12.2*  HCT 37.0* 37.7* 42.7  PLT 101* 105* 122*    Recent Labs Lab 12/14/14 1950  12/16/14 0211  12/17/14 0228 12/18/14 0312  NA  --   < > 141 137 138  K  --   < > 4.7 4.5 5.1  CL  --   < > 92* 82* 82*  CO2  --   < > 39* 47* 43*  BUN  --   < > 41* 41* 47*  CREATININE  --   < > 1.42* 1.43* 1.51*  CALCIUM  --   < > 9.3 9.4 9.7  PROT 6.9  --   --   --   --   BILITOT 1.2  --   --   --   --   ALKPHOS 56  --   --   --   --   ALT 10*  --   --   --   --   AST 19  --   --   --   --   GLUCOSE  --   < > 205* 355* 293*  < > = values in this interval not displayed.Lactic acid: 2.33>2.5>3.2 >2.2  ABG    Component Value Date/Time   PHART 7.402 12/16/2014 0409   PCO2ART 65.3* 12/16/2014 0409   PO2ART 90.0 12/16/2014 0409   HCO3 39.8* 12/16/2014 0409   TCO2 41.8 12/16/2014 0409   O2SAT 96.8 12/16/2014 0409  Risk Stratification Labs  TSH    Component Value Date/Time   TSH 2.29 12/09/2012 1030   Hemoglobin A1C    Component Value Date/Time   HGBA1C 7.1* 12/15/2014 0505   Lipid Panel     Component Value Date/Time   CHOL 89 12/15/2014 0245   TRIG 60 12/15/2014 0245   HDL 42 12/15/2014 0245   CHOLHDL 2.1 12/15/2014 0245   VLDL 12 12/15/2014 0245   LDLCALC 35 12/15/2014 0245    Imaging/Diagnostic Tests: CXR: Chronic bronchitic changes. No definite infiltrates. Possible small effusions.  CXR: Developing mild interstitial edema. Slight increase in bibasilar atelectasis. Probable small right pleural effusion.  CXR 6/17: Mild persistent CHF.  CT head: Atrophy with small vessel chronic ischemic changes of deep cerebral white matter.No acute intracranial abnormalities.  CXR 6/18: Bibasilar airspace opacities may reflect pneumonia or pulmonary edema, similar in appearance to the prior study. Underlying vascular congestion noted.  Archie Patten, MD 12/19/2014, 8:14 AM PGY-1, [place] Intern pager: [phone], text pages welcome

## 2014-12-19 NOTE — Clinical Social Work Note (Signed)
Clinical Social Work Assessment  Patient Details  Name: James Moon MRN: [id number] Date of Birth: 1945-08-18  Date of referral:  12/19/14               Reason for consult:  Discharge Planning, Facility Placement                Permission sought to share information with:  Facility Art therapist granted to share information::  Yes, Verbal Permission Granted  Name::        Agency::  SNFs  Relationship::     Contact Information:     Housing/Transportation Living arrangements for the past 2 months:  Apartment Source of Information:  Patient Patient Interpreter Needed:  None Criminal Activity/Legal Involvement Pertinent to Current Situation/Hospitalization:  No - Comment as needed Significant Relationships:  Adult Children Lives with:  Self Do you feel safe going back to the place where you live?  Yes Need for family participation in patient care:  No (Coment)  Care giving concerns:  Patient agrees that he needs to go to SNF at discharge due to his difficulty with breathing and his currently mobility issues.   Social Worker assessment / plan:  CSW met with patient at bedside to complete assessment. Patient states that he lives here in [place] by himself. He reports that he does have a daughter and she lives in this area. CSW explained that PT has recommended SNF placement for the patient due to his breathing. The patient is agreeable to placement but is not familiar with any of the local facilities. CSW explained the SNF search/placement process and answered the patient's questions. CSW will follow up with bed offers.   Employment status:  Retired Nurse, adult PT Recommendations:  [place] / Referral to community resources:  [place]  Patient/Family's Response to care:  Patient does not have any complaints about his care and seems to be pleased wit the care he is receiving.    Patient/Family's Understanding of and Emotional Response to Diagnosis, Current Treatment, and Prognosis:  Patient appears to have fair insight into reason for admission and understands what his post DC needs will be.   Emotional Assessment Appearance:  Appears older than stated age Attitude/Demeanor/Rapport:  Other (Appropriate) Affect (typically observed):  Accepting, Appropriate, Calm, Stable Orientation:  Oriented to Self, Oriented to Place, Oriented to  Time, Oriented to Situation Alcohol / Substance use:  Tobacco Use Psych involvement (Current and /or in the community):  No (Comment)  Discharge Needs  Concerns to be addressed:  Discharge Planning Concerns Readmission within the last 30 days:  No Current discharge risk:  Physical Impairment, Chronically ill Barriers to Discharge:  Continued Medical Work up   [employer] MSW, [place], [place], [phone]

## 2014-12-19 NOTE — Progress Notes (Signed)
Pt.had 3 runs of Vtach.pt.is asymptomatic,DR.Gottschalk made aware.

## 2014-12-19 NOTE — Progress Notes (Addendum)
PULMONARY / CRITICAL CARE MEDICINE   Name: James Moon MRN: [id number] DOB: September 07, 1945    ADMISSION DATE:  12/14/2014 CONSULTATION DATE:  12/15/14   REFERRING MD :  Dr. Mingo Amber / [facility]PT[facility]   CHIEF COMPLAINT:  Acute on Chronic Respiratory Failure   INITIAL PRESENTATION: 69 y/o M, smoker, admitted on 6/15 with acute on chronic respiratory failure in the setting of suspected CHF exacerbation and confusion.  Found to have hypercarbic respiratory failure and placed on bipap support.  PCCM consulted for evaluation 6/16.    STUDIES:  6/16  CXR >> chronic bronchitic changes, small bilateral effusions  SIGNIFICANT EVENTS: 6/15  Admit with SOB, acute on chronic resp fx, confusion/lethargy, LE swelling. 6/16  Mild agitation overnight, resolved by am.   6/17  50% VM, mentation much improved 6/18 5L [place] sats better , off bipap     SUBJECTIVE: desatn with PT Required 8L [place] & 5 mins to recover  VITAL SIGNS: Temp:  [97.2 F (36.2 C)-98.2 F (36.8 C)] 97.2 F (36.2 C) (06/20 1100) Pulse Rate:  [77-88] 86 (06/20 1100) Resp:  [18-22] 18 (06/20 0536) BP: (102-119)/(53-62) 102/57 mmHg (06/20 1100) SpO2:  [91 %-98 %] 97 % (06/20 1100) Weight:  [57.3 kg (126 lb 5.2 oz)] 57.3 kg (126 lb 5.2 oz) (06/20 0349)   VENTILATOR SETTINGS:     INTAKE / OUTPUT:  Intake/Output Summary (Last 24 hours) at 12/19/14 1233 Last data filed at 12/19/14 0930  Gross per 24 hour  Intake    280 ml  Output    880 ml  Net   -600 ml    PHYSICAL EXAMINATION: General:  Chronically ill adult male in NAD Neuro:  Awake, alert, oriented.  MAE.  L pupil 40mm, R 49mm (pt states is normal) HEENT:  MM pink/moist, JVD+ Cardiovascular:  s1s2 rrr, distant tones  Lungs:  resp's even/non-labored, lungs bilaterally diminished  Abdomen:  NTND, bsx4 active  Musculoskeletal:  No acute deformities  Skin:  Thin skin, scattered bruises, BLE's with chronic venous stasis, odor, LLE venous wound  LABS:  CBC  Recent Labs Lab  12/17/14 0228 12/18/14 0312 12/19/14 0651  WBC 6.5 8.7 8.5  HGB 10.5* 12.2* 12.1*  HCT 37.7* 42.7 41.2  PLT 105* 122* 108*   Coag's No results for input(s): APTT, INR in the last 168 hours.   BMET  Recent Labs Lab 12/17/14 0228 12/18/14 0312 12/19/14 0651  NA 137 138 138  K 4.5 5.1 4.4  CL 82* 82* 82*  CO2 47* 43* 45*  BUN 41* 47* 70*  CREATININE 1.43* 1.51* 1.98*  GLUCOSE 355* 293* 213*   Electrolytes  Recent Labs Lab 12/17/14 0228 12/18/14 0312 12/19/14 0651  CALCIUM 9.4 9.7 8.8*   Sepsis Markers  Recent Labs Lab 12/15/14 0245 12/15/14 1010 12/15/14 1152  LATICACIDVEN 3.2* 2.0 2.2*   ABG  Recent Labs Lab 12/15/14 1026 12/15/14 1600 12/16/14 0409  PHART 7.282* 7.301* 7.402  PCO2ART 79.7* 76.1* 65.3*  PO2ART 72.0* 92.6 90.0   Liver Enzymes  Recent Labs Lab 12/14/14 1950  AST 19  ALT 10*  ALKPHOS 56  BILITOT 1.2  ALBUMIN 3.3*   Cardiac Enzymes  Recent Labs Lab 12/15/14 0012 12/15/14 0505 12/15/14 1010  TROPONINI 0.08* 0.08* 0.09*   Glucose  Recent Labs Lab 12/17/14 2148 12/18/14 0813 12/18/14 1148 12/18/14 1752 12/18/14 2121 12/19/14 0856  GLUCAP 294* 263* 180* 177* 197* 309*    Imaging No results found.   ASSESSMENT / PLAN:  PULMONARY  A: Acute Hypercarbic on Chronic Respiratory Failure>>improved Severe O2 Dependent COPD Former [place] office pt, last seen 2012.  PFTs 2013: FeV1 50% Gold C, suspect much  worse now with ongoing tobacco use Small Pleural Effusions  Tobacco Abuse, says he has quit, will need ongoing support.  Pulmonary edema >>improved  P:   Cont  budesonide/brovana while inpatient  Lasix per Cardiology  Taper prednisone to 40mg  /d and slow taper to 10mg  per day , drop by 10mg  every 4 days Oxygen titration, will need home oxygen  CARDIOVASCULAR CVL A:  Acute on Chronic Systolic / Diastolic Heart Failure Mild Troponin Leak  CAD s/p CABG P:  Beta blocker per cards  RENAL A:   Mild  Hyperkalemia resolved -cr rising again AKI resolved  P:   Hold lasix Replace electrolytes as indicated    INFECTIOUS A:   AECOPD P:   BCx2 6/16 >> neg  Levaquin, start date 6/16, day 5/5  D/c ABX 6/19   NEUROLOGIC A:   Metabolic Encephalopathy -resolved,  CT head NEG Severe debility, lives alone P:  PT eval -OK to sue venti mask during PT Agree with snf placement   FAMILY  - Updates:  updated daughter 6/19  apprec pall care help, now dni.    On d/c needs outpt f/u with Dr Selinda Orion in [place] to sign off   [facility] V.MD  Kara Mead MD. [facility]. [place] [facility] Pager [phone] If no response call [phone]   12/19/2014, 12:33 PM

## 2014-12-19 NOTE — Progress Notes (Signed)
Physical Therapy Treatment Patient Details Name: James Moon MRN: [id number] DOB: Mar 03, 1946 Today's Date: 12/19/2014    History of Present Illness This 69 y.o. smoker admitted with progressive SOB, confusion and LE swelling.   Pt started on BiPAP.  Diagnosed with acute on chronic respiratory failure with CHF adn COPD exacerbation.  Troponin elevated, but per cardiology likey due to respiratory distress and not cardiac related.  PMH includes:  COPD, DM, colon CA, HTN, CAD, ischemic cardiomyopahty, tobacco abuse, chronic systolic CHF, NSVT, AICD ([facility]), thrombocytopenia, implantable cardioverter defibrillator implant, s/p CABG    PT Comments    Overall very good participation and effort, given this is the first time he has walked since on the Unit; Fatigued relatively quickly as noted below; Will need post-acute rehab to maximize independence and safety with mobility prior to dc home; have placed CIR screen (it's a longshot, but worth consideration) -- if unable to go to CIR, then SNF;  Session initiated on 4L O2; sats decr to 81% (observed lowest with good waveform), increased supplemental O2 to 6L then 8L; O2 sats incr back to 90% with 5-7 minutes of seated rest on 8 liters O2, decr to 6 L O2; Notifed RN and Respiratory Therapy arrived; ended session in bed on 6 L supplemental O2, sats 95%; MDs aware of status   Follow Up Recommendations  Other (comment) (post-acute rehab)     Equipment Recommendations  Rolling walker with 5" wheels;3in1 (PT);Other (comment) (home O2)    Recommendations for Other Services       Precautions / Restrictions Precautions Precautions: Fall Precaution Comments: May need O2 via mask with activity Restrictions Weight Bearing Restrictions: No    Mobility  Bed Mobility Overal bed mobility: Needs Assistance Bed Mobility: Supine to Sit;Sit to Supine     Supine to sit: Min assist Sit to supine: Supervision   General bed mobility comments: Min  handheld assist to pull to sit  Transfers Overall transfer level: Needs assistance Equipment used: Rolling walker (2 wheeled) Transfers: Sit to/from Stand Sit to Stand: Min assist         General transfer comment: min assist to power up  Ambulation/Gait Ambulation/Gait assistance: Min assist;Mod assist Ambulation Distance (Feet): 20 Feet Assistive device: Rolling walker (2 wheeled) Gait Pattern/deviations: Step-through pattern;Trunk flexed;Narrow base of support Gait velocity: slowed   General Gait Details: Cues to self-monitor for activity tolerance; Fatigued quickly, and required more assist on the way back to bed with tendency to lose balance posteriorly (bed to door and back to bed)   Stairs            Wheelchair Mobility    Modified Rankin (Stroke Patients Only)       Balance Overall balance assessment: Needs assistance           Standing balance-Leahy Scale: Poor Standing balance comment: REquires UEs for support                    Cognition Arousal/Alertness: Awake/alert Behavior During Therapy: WFL for tasks assessed/performed Overall Cognitive Status: Within Functional Limits for tasks assessed                      Exercises      General Comments General comments (skin integrity, edema, etc.): Session initiated on 4L O2; sats decr to 81% (observed lowest with good waveform), increased supplemental O2 to 6L then 8L; O2 sats incr back to 90% with 5-7 minutes of seated rest on  8 liters O2, decr to 6 L O2; Notifed RN and Respiratory Therapy arrived; ended session in bed on 6 L supplemental O2, sats 95%; MDs aware of status      Pertinent Vitals/Pain Pain Assessment: No/denies pain    Home Living                      Prior Function            PT Goals (current goals can now be found in the care plan section) Acute Rehab PT Goals Patient Stated Goal: Hopes to not need a mask for O2 PT Goal Formulation: With  patient Time For Goal Achievement: 12/29/14 Potential to Achieve Goals: Good Progress towards PT goals: Progressing toward goals    Frequency  Min 3X/week    PT Plan Discharge plan needs to be updated    Co-evaluation             End of Session Equipment Utilized During Treatment: Oxygen Activity Tolerance: Patient tolerated treatment well;Patient limited by fatigue Patient left: in bed;with call bell/phone within reach     Time: 3846-6599 PT Time Calculation (min) (ACUTE ONLY): 35 min  Charges:  $Gait Training: 8-22 mins $Therapeutic Activity: 8-22 mins                    G Codes:      James Moon 12/19/2014, 9:38 AM  James Moon, [name] Pager [phone] Office [phone]

## 2014-12-20 LAB — CULTURE, BLOOD (ROUTINE X 2)
Culture: NO GROWTH
Culture: NO GROWTH

## 2014-12-20 LAB — GLUCOSE, CAPILLARY
GLUCOSE-CAPILLARY: 77 mg/dL (ref 65–99)
GLUCOSE-CAPILLARY: 133 mg/dL — AB (ref 65–99)
Glucose-Capillary: 125 mg/dL — ABNORMAL HIGH (ref 65–99)
Glucose-Capillary: 165 mg/dL — ABNORMAL HIGH (ref 65–99)

## 2014-12-20 NOTE — Plan of Care (Signed)
Problem: Phase II Progression Outcomes Goal: ADLs completed with minimal assistance Outcome: Adequate for Discharge Pt from SNF going to SNF.

## 2014-12-20 NOTE — Progress Notes (Addendum)
Occupational Therapy Treatment Patient Details Name: James Moon MRN: [id number] DOB: 1945-09-30 Today's Date: 12/20/2014    History of present illness This 69 y.o. smoker admitted with progressive SOB, confusion and LE swelling.   Pt started on BiPAP.  Diagnosed with acute on chronic respiratory failure with CHF adn COPD exacerbation.  Troponin elevated, but per cardiology likey due to respiratory distress and not cardiac related.  PMH includes:  COPD, DM, colon CA, HTN, CAD, ischemic cardiomyopahty, tobacco abuse, chronic systolic CHF, NSVT, AICD ([facility]), thrombocytopenia, implantable cardioverter defibrillator implant, s/p CABG   OT comments  Pt agreeable to session. Pt on around 4L of O2 in session and O2 sats dropping in 80s, however pulse oximeter having difficulty reading at times. O2 in 90s at end of session.  Follow Up Recommendations    SNF vs. LTACH   Equipment Recommendations  Tub/shower bench    Recommendations for Other Services      Precautions / Restrictions Precautions Precautions: Fall Precaution Comments: watch O2 sats Restrictions Weight Bearing Restrictions: No       Mobility Bed Mobility Overal bed mobility: Needs Assistance Bed Mobility: Supine to Sit;Sit to Supine     Supine to sit: Mod assist Sit to supine: Supervision   General bed mobility comments: assist with trunk to come to sitting position.  Transfers Overall transfer level: Needs assistance   Transfers: Sit to/from Stand Sit to Stand: Min assist              Balance Overall balance assessment: Needs assistance    Pt with decreased sitting balance requiring Min-Mod assist at times. Other times, pt not requiring physical assist.                             ADL Overall ADL's : Needs assistance/impaired     Grooming: Wash/dry face;Applying deodorant;Set up;Supervision/safety;Sitting   Upper Body Bathing: Minimal assitance;Sitting Upper Body Bathing Details  (indicate cue type and reason): assist for balance Lower Body Bathing: Sit to/from stand;Minimal assistance   Upper Body Dressing : Set up;Supervision/safety;Sitting   Lower Body Dressing: Sitting/lateral leans;Minimal assistance (socks) Lower Body Dressing Details (indicate cue type and reason): assist to get pt upright after donning socks-leaning on bed; assist getting socks started over feet Toilet Transfer: Minimal assistance (sit to stand from bed)             General ADL Comments: Pt performed ADLs sitting EOB and stood to wash bottom and peri area. Educated on energy conservation. pt with decreased balance sitting EOB.  Cues for deep breathing technique.      Vision                     Perception     Praxis      Cognition  Awake/Alert Behavior During Therapy: WFL for tasks assessed/performed Overall Cognitive Status: Within Functional Limits for tasks assessed/difficult to assess due to [facility]                       Extremity/Trunk Assessment               Exercises     Shoulder Instructions       General Comments      Pertinent Vitals/ Pain       Pain Assessment: No/denies pain; Pt on around 4L of O2 in session and O2 dropping in 80s, however pulse oximeter having difficulty  reading at times. O2 in 90s at end of session.   Home Living                                          Prior Functioning/Environment              Frequency Min 2X/week     Progress Toward Goals  OT Goals(current goals can now be found in the care plan section)  Progress towards OT goals: Not progressing toward goals - comment  Acute Rehab OT Goals Patient Stated Goal: not stated OT Goal Formulation: With patient Time For Goal Achievement: 12/29/14 Potential to Achieve Goals: Good ADL Goals Pt Will Perform Grooming: with modified independence;standing Pt Will Perform Upper Body Bathing: with modified independence;sitting Pt Will  Perform Lower Body Bathing: with modified independence;sit to/from stand Pt Will Perform Upper Body Dressing: with modified independence;sitting Pt Will Perform Lower Body Dressing: with modified independence;sit to/from stand Pt Will Transfer to Toilet: with modified independence;ambulating;regular height toilet;bedside commode;grab bars Pt Will Perform Toileting - Clothing Manipulation and hygiene: with modified independence;sit to/from stand Pt Will Perform Tub/Shower Transfer: Tub transfer;with modified independence;ambulating;shower seat;tub bench;rolling walker  Plan Discharge plan needs to be updated    Co-evaluation                 End of Session Equipment Utilized During Treatment: Gait belt;Oxygen   Activity Tolerance Patient tolerated treatment well   Patient Left in bed;with call bell/phone within reach;with bed alarm set   Nurse Communication Other (comment) (O2 sats; bathed off)        Time: 6754-4920 (interrupted by case manager and pt talked on phone briefly; pulse oximeter having difficulty reading) OT Time Calculation (min): 24 min  Charges: OT General Charges $OT Visit: 1 Procedure OT Treatments $Self Care/Home Management : 8-22 mins  Benito Mccreedy OTR/L [phone] 12/20/2014, 4:54 PM

## 2014-12-20 NOTE — Progress Notes (Signed)
NCM received information from CSW that Dr. Lorenso Courier wanted to see if patient is a candidate for LTAC.  NCM made referral to LTAC to see if patient would be a candidate, per Seth Bake at [facility] states yes, there must be a precert required, $169 /day for the day 1-7 until they meet out of pocket max which is $5100.00,  Patient has met $2200.00 of this already, but not sure how much he has met with this hospital stay.  [facility] will not bill up front but will bill after his stay there per Seth Bake.  This information was relayed to patient and his daughter , Lenna Sciara [phone]. Patient and Lenna Sciara state they are interested in LTAC, informed Melissa that we have to get authorization from insurance first of all, because they may not authorize this if not then patient will need to go back to snf.  MD will put order in for LTAC, so that Seth Bake can send to insurance company to get authorization.

## 2014-12-20 NOTE — Clinical Social Work Note (Signed)
Bed offers given to patient.  Liz Beach MSW, [place], [place], [phone]

## 2014-12-20 NOTE — Progress Notes (Signed)
PULMONARY / CRITICAL CARE MEDICINE   Name: James Moon MRN: [id number] DOB: 1945-10-20    ADMISSION DATE:  12/14/2014 CONSULTATION DATE:  12/15/14   REFERRING MD :  Dr. Mingo Amber / FPTS   CHIEF COMPLAINT:  Acute on Chronic Respiratory Failure   INITIAL PRESENTATION: 69 y/o M, smoker, admitted on 6/15 with acute on chronic respiratory failure in the setting of suspected CHF exacerbation and confusion.  Found to have hypercarbic respiratory failure and placed on bipap support.  PCCM consulted for evaluation 6/16.   He has ischemic CMP & AICD  STUDIES:  6/16  CXR >> chronic bronchitic changes, small bilateral effusions  SIGNIFICANT EVENTS: 6/15  Admit with SOB, acute on chronic resp fx, confusion/lethargy, LE swelling. 6/16  Mild agitation overnight, resolved by am.   6/17  50% VM, mentation much improved 6/18 5L [place] sats better , off bipap     SUBJECTIVE: Requires 5l [place] at rest Denies CP, dyspnea  -improving  VITAL SIGNS: Temp:  [97.2 F (36.2 C)-98.5 F (36.9 C)] 97.9 F (36.6 C) (06/21 0900) Pulse Rate:  [71-86] 74 (06/21 0900) Resp:  [16-18] 18 (06/21 0900) BP: (102-115)/(56-86) 112/56 mmHg (06/21 0900) SpO2:  [90 %-100 %] 94 % (06/21 0900) Weight:  [129 lb 8 oz (58.741 kg)] 129 lb 8 oz (58.741 kg) (06/21 0500)   VENTILATOR SETTINGS:     INTAKE / OUTPUT:  Intake/Output Summary (Last 24 hours) at 12/20/14 0955 Last data filed at 12/19/14 2200  Gross per 24 hour  Intake    130 ml  Output    200 ml  Net    -70 ml    PHYSICAL EXAMINATION: General:  Chronically ill adult male in NAD Neuro:  Awake, alert, oriented.  MAE.  L pupil 56mm, R 40mm (chronic) HEENT:  MM pink/moist, JVD+ Cardiovascular:  s1s2 rrr, distant tones  Lungs:  resp's even/non-labored, lungs bilaterally diminished  Abdomen:  NTND, bsx4 active  Musculoskeletal:  No acute deformities  Skin:  Thin skin, scattered bruises, BLE's with chronic venous stasis, odor, LLE venous  wound  LABS:  CBC  Recent Labs Lab 12/17/14 0228 12/18/14 0312 12/19/14 0651  WBC 6.5 8.7 8.5  HGB 10.5* 12.2* 12.1*  HCT 37.7* 42.7 41.2  PLT 105* 122* 108*   Coag's No results for input(s): APTT, INR in the last 168 hours.   BMET  Recent Labs Lab 12/17/14 0228 12/18/14 0312 12/19/14 0651  NA 137 138 138  K 4.5 5.1 4.4  CL 82* 82* 82*  CO2 47* 43* 45*  BUN 41* 47* 70*  CREATININE 1.43* 1.51* 1.98*  GLUCOSE 355* 293* 213*   Electrolytes  Recent Labs Lab 12/17/14 0228 12/18/14 0312 12/19/14 0651  CALCIUM 9.4 9.7 8.8*   Sepsis Markers  Recent Labs Lab 12/15/14 0245 12/15/14 1010 12/15/14 1152  LATICACIDVEN 3.2* 2.0 2.2*   ABG  Recent Labs Lab 12/15/14 1026 12/15/14 1600 12/16/14 0409  PHART 7.282* 7.301* 7.402  PCO2ART 79.7* 76.1* 65.3*  PO2ART 72.0* 92.6 90.0   Liver Enzymes  Recent Labs Lab 12/14/14 1950  AST 19  ALT 10*  ALKPHOS 56  BILITOT 1.2  ALBUMIN 3.3*   Cardiac Enzymes  Recent Labs Lab 12/15/14 0012 12/15/14 0505 12/15/14 1010  TROPONINI 0.08* 0.08* 0.09*   Glucose  Recent Labs Lab 12/18/14 2121 12/19/14 0856 12/19/14 1227 12/19/14 1724 12/19/14 2150 12/20/14 0818  GLUCAP 197* 309* 170* 184* 151* 77    Imaging No results found.   ASSESSMENT / PLAN:  PULMONARY  A: Acute Hypercarbic on Chronic Respiratory Failure>>improved Severe O2 Dependent COPD [place] office pt, last seen 2012.  PFTs 2013: FeV1 50% Gold C, suspect much worse now with ongoing tobacco use Small Pleural Effusions  Tobacco Abuse, says he has quit, will need ongoing support.  Pulmonary edema >>improved  P:   Cont  budesonide/brovana while inpatient  -back to symbicort & duonebs on dc Taper prednisone to 40mg  /d and slow taper to 10mg  per day , drop by 10mg  every 4 days Oxygen titration, will need home oxygen  CARDIOVASCULAR  A:  Acute on Chronic Systolic / Diastolic Heart Failure Mild Troponin Leak  CAD s/p  CABG Echo (3/16) with EF 25-30% and akinesis of apex, anterior septum and anterior wall. P:  Beta blocker per cards Not on ACEI  RENAL A:   Mild Hyperkalemia resolved AKI resolved  -cr rising again P:   Hold lasix Replace electrolytes as indicated    NEUROLOGIC A:   Metabolic Encephalopathy -resolved,  CT head NEG Severe debility, lives alone P:  PT eval -OK to sue venti mask during PT Agree with snf placement   FAMILY  - Updates:  updated daughter 6/19  apprec pall care help, now dni.    On d/c needs outpt f/u with Dr Selinda Orion in [place] to sign off  - hypoxia may take a few days to resolve to baseline  [facility].MD  Kara Mead MD. [facility]. [place] [facility] Pager [phone] If no response call [phone]   12/20/2014, 9:55 AM

## 2014-12-20 NOTE — Progress Notes (Signed)
Family Medicine Teaching Service Daily Progress Note Intern Pager: [phone]  Patient name: James Moon Medical record number: [id number] Date of birth: 12-11-1945 Age: 69 y.o. Gender: male  Primary Care Provider: Lamar Blinks, MD Consultants: CHF, CCM Code Status: partial code - DNI  Pt Overview and Major Events to Date:  6/15: Patient unresponsive in acute respiratory failure requiring BiPAP. Solu-medrol in ED 6/16: continued to be on BiPAP 6/17: Weaned to [place]  Assessment and Plan: James Moon is a 69 y.o. male presenting with AMS and respiratory failure . PMH is significant for COPD on home O2, chronic combined systolic and diastolic CHF, pulmonary HTN T2DM, tobacco abuse.  Acute on chronic respiratory failure, Respiratory acidosis in setting of CHF, pulmonary HTN, COPD: Presenting with altered mental status that has somewhat improved per EDP with BiPAP. Desats to 70s when taken off of BiPAP per ED (though not recorded). Afebrile, BP slightly soft to sBP in 100s, VS otherwise stable. ABG shows respiratory acidosis with pH 7.155, pCO2 100, pO2 319, bicarb 34 that improved slightly to pH 7.242, pCO2 82, and pO2 88.9 with BiPAP in the ED. Initial CXR shows chronic bronchitic changes with no infiltrates or edema. Initial troponin 0.06, EKG with LBBB (possibly new since last EKG), PVC, baseline wander, no ST elevations. Given changes in EKG and elevation in trops: 0.06>0.08>0.08, and significant cardiac history, ACS was ruled out. Additionally, BNP noted to be elevated at 1851 and patient's last EF was noted to be 25-30%, therefore a CHF exacerbation on his already poor cadiopulmonary stated (severe COPD with continued smoking) is the most likely etiology. Repeat CXR on 6/17 revealed mild persistent CHF and it was stable on 6/18. - Patient currently on 6L [place] after bed change, decreased to 5L while I was in the room - prednisone 40mg  D3/4, then taper 10mg  q 4 days, then decrease 10mg   each time to 10mg . - s/p 5 days of Levaquin daily. - PRN duonebs q4hr - CCM c/s: symbicort to budesonide/brovana while inpt, continue prednisone taper, pt not a great candidate for intubation  - cardiology c/s: patient appears dry on exam. Done with aggressive diuresis.  - Lactic acid improved: 2.33>2.5>3.2>2.2 - blood cultures (after levaquin) NGTD - Given end state COPD, CHF, and pulmonary HTN in the setting of continued smoking, palliative consult: rec SNF and would be candidate for hospice if desired  Altered Mental Status: Likely 2/2 respiratory acidosis with hypercapnia as above.Patient oriented to person, place, and situation. Patient states he tripped over a rocker and fell. Does not recall harming himself or hitting his head. On neurologic exam, L pupil constricted, both minimally reactive to light. CT of head without acute abnormalities.  - Avoid sedating medications  Ischemic cardiomyopathy, CAD s/p CABG, CHF: Last Echo (3/16) with EF 25-30% and akinesis of apex, anterior septum and anterior wall.Has AICD in place. Appears euvolemic on exam - Dry weight appears to be 143-145lbs from outpt visits, last 08/2014. On admission was 158lbs.  - home statin, plavix, aspirin AP - restart home metoprolol today: watch HR, BP - does not appear to be on an ACE-i - holding home lasix 40mg  PO BID as pt appears dry and bump in creatinine to 1.98.  - cardiology following, appreciate recs - daily weights: 158>159>149 >140 > 133 >126 >129 -  Strict I&Os: -20cc   Concerns for aspiration: Patient's daughter reports choking and coughing when attempting to eat for last few months - SLP with no concerns for aspiration.  T2DM: Last  A1c 10.5 (08/30/14). On SSI, 70/30 and Lantus at home per med rec. - monitor CBGs: 77-309 - sensitive SSI - 11 units yesterday - holding home 70/30 - continue Lantus 15 units qhs   Tobacco abuse: Daughter reports he smokes at least 1 PPD - Nicotine patch 21mg  - tobacco  cessation counseling after mental status improves  L leg poorly healing wound, chronic venous stasis: Does not appear acutely infected. Seen at wound care clinic as OP per daughter. - Wound care c/s for management inpatient - elevate feet  Thrombocytopenia: Stable from previous hospitalizations. One note from 2005 notes possible hematology outpatient follow up, however unsure if this ever occurred. - continue to monitor  FEN/GI: Regular diet, SLIV Prophylaxis: SQ heparin  Disposition: floor status, PT c/s, d/c pending respiratory status - likely later today  Subjective:  Patient with no SOB or chest pain. Stated he spoke to his daughter (after I spoke with her). They agreed he should go to a SNF as he needs 24hr supervision that she cannot provide.   Objective: Temp:  [97.2 F (36.2 C)-98.5 F (36.9 C)] 97.8 F (36.6 C) (06/21 0531) Pulse Rate:  [71-86] 71 (06/21 0531) Resp:  [16-18] 18 (06/21 0531) BP: (102-115)/(56-86) 115/86 mmHg (06/21 0531) SpO2:  [90 %-100 %] 97 % (06/21 0732) Weight:  [129 lb 8 oz (58.741 kg)] 129 lb 8 oz (58.741 kg) (06/21 0500) Physical Exam: General: sitting in bed, smiling, in NAD   Cardiovascular: RRR. No m/r/g noted. No LE edema b/l. Respiratory: No increased WOB. Crackles in the bases b/l. No wheezing or rhonchi noted. Currently comfortable on 5L [place] Abdomen: +BS, soft, ND/NT Extremities: Chronic venous stasis changes to the LEs b/l. 4x2cm full thickness ulcer on left lower extremity with minimal yellow drainage, covered by foam dressing. Very dry, scaly skin Neuro: Alert. Oriented to person, place, and situation, but not to date. Facial movements symmetric. 5/5 strength in the LE and UE b/l. L pupil constricted on the L compared to the R. Minimally reactive pupils b/l.   Laboratory:  Recent Labs Lab 12/17/14 0228 12/18/14 0312 12/19/14 0651  WBC 6.5 8.7 8.5  HGB 10.5* 12.2* 12.1*  HCT 37.7* 42.7 41.2  PLT 105* 122* 108*    Recent  Labs Lab 12/14/14 1950  12/17/14 0228 12/18/14 0312 12/19/14 0651  NA  --   < > 137 138 138  K  --   < > 4.5 5.1 4.4  CL  --   < > 82* 82* 82*  CO2  --   < > 47* 43* 45*  BUN  --   < > 41* 47* 70*  CREATININE  --   < > 1.43* 1.51* 1.98*  CALCIUM  --   < > 9.4 9.7 8.8*  PROT 6.9  --   --   --   --   BILITOT 1.2  --   --   --   --   ALKPHOS 56  --   --   --   --   ALT 10*  --   --   --   --   AST 19  --   --   --   --   GLUCOSE  --   < > 355* 293* 213*  < > = values in this interval not displayed.Lactic acid: 2.33>2.5>3.2 >2.2  ABG    Component Value Date/Time   PHART 7.402 12/16/2014 0409   PCO2ART 65.3* 12/16/2014 0409   PO2ART 90.0 12/16/2014 0409  HCO3 39.8* 12/16/2014 0409   TCO2 41.8 12/16/2014 0409   O2SAT 96.8 12/16/2014 0409    Risk Stratification Labs  TSH    Component Value Date/Time   TSH 2.29 12/09/2012 1030   Hemoglobin A1C    Component Value Date/Time   HGBA1C 7.1* 12/15/2014 0505   Lipid Panel     Component Value Date/Time   CHOL 89 12/15/2014 0245   TRIG 60 12/15/2014 0245   HDL 42 12/15/2014 0245   CHOLHDL 2.1 12/15/2014 0245   VLDL 12 12/15/2014 0245   LDLCALC 35 12/15/2014 0245    Imaging/Diagnostic Tests: CXR: Chronic bronchitic changes. No definite infiltrates. Possible small effusions.  CXR: Developing mild interstitial edema. Slight increase in bibasilar atelectasis. Probable small right pleural effusion.  CXR 6/17: Mild persistent CHF.  CT head: Atrophy with small vessel chronic ischemic changes of deep cerebral white matter.No acute intracranial abnormalities.  CXR 6/18: Bibasilar airspace opacities may reflect pneumonia or pulmonary edema, similar in appearance to the prior study. Underlying vascular congestion noted.  Archie Patten, MD 12/20/2014, 7:34 AM PGY-1, [place] Intern pager: [phone], text pages welcome

## 2014-12-21 LAB — GLUCOSE, CAPILLARY
GLUCOSE-CAPILLARY: 161 mg/dL — AB (ref 65–99)
Glucose-Capillary: 28 mg/dL — CL (ref 65–99)
Glucose-Capillary: 29 mg/dL — CL (ref 65–99)
Glucose-Capillary: 145 mg/dL — ABNORMAL HIGH (ref 65–99)
Glucose-Capillary: 288 mg/dL — ABNORMAL HIGH (ref 65–99)
Glucose-Capillary: 320 mg/dL — ABNORMAL HIGH (ref 65–99)

## 2014-12-21 LAB — BASIC METABOLIC PANEL
ANION GAP: 12 (ref 5–15)
BUN: 95 mg/dL — ABNORMAL HIGH (ref 6–20)
CALCIUM: 8.5 mg/dL — AB (ref 8.9–10.3)
CO2: 45 mmol/L — ABNORMAL HIGH (ref 22–32)
CREATININE: 2.53 mg/dL — AB (ref 0.61–1.24)
Chloride: 86 mmol/L — ABNORMAL LOW (ref 101–111)
GFR, EST AFRICAN AMERICAN: 28 mL/min — AB (ref >60)
GFR, EST NON AFRICAN AMERICAN: 24 mL/min — AB (ref >60)
Glucose, Bld: 158 mg/dL — ABNORMAL HIGH (ref 65–99)
Potassium: 4.2 mmol/L (ref 3.5–5.1)
Sodium: 143 mmol/L (ref 135–145)

## 2014-12-21 MED ORDER — FUROSEMIDE 40 MG PO TABS: 40.0000 mg | ORAL_TABLET | Freq: Every day | ORAL | Status: DC

## 2014-12-21 MED ORDER — PREDNISONE 20 MG PO TABS
30.0000 mg | ORAL_TABLET | Freq: Every day | ORAL | Status: DC
  Administered 2014-12-22: 30 mg via ORAL
  Filled 2014-12-21 (×2): qty 1

## 2014-12-21 MED ORDER — PREDNISONE 10 MG PO TABS: ORAL_TABLET | ORAL | Status: DC

## 2014-12-21 MED ORDER — DEXTROSE 50 % IV SOLN
INTRAVENOUS | Status: AC
  Administered 2014-12-21: 50 mL
  Filled 2014-12-21: qty 50

## 2014-12-21 MED ORDER — INSULIN GLARGINE 100 UNIT/ML SOLOSTAR PEN: 10.0000 [IU] | PEN_INJECTOR | Freq: Every day | SUBCUTANEOUS | Status: DC

## 2014-12-21 MED ORDER — METOPROLOL TARTRATE 25 MG PO TABS: 12.5000 mg | ORAL_TABLET | Freq: Two times a day (BID) | ORAL | Status: AC

## 2014-12-21 MED ORDER — INSULIN GLARGINE 100 UNIT/ML ~~LOC~~ SOLN
10.0000 [IU] | Freq: Every day | SUBCUTANEOUS | Status: DC
  Administered 2014-12-21: 10 [IU] via SUBCUTANEOUS
  Filled 2014-12-21 (×2): qty 0.1

## 2014-12-21 NOTE — Progress Notes (Addendum)
Physical Therapy Treatment Patient Details Name: James Moon MRN: [id number] DOB: [birth year] Today's Date: 12/21/2014    History of Present Illness This 69 y.o. smoker admitted with progressive SOB, confusion and LE swelling.   Pt started on BiPAP.  Diagnosed with acute on chronic respiratory failure with CHF adn COPD exacerbation.  Troponin elevated, but per cardiology likey due to respiratory distress and not cardiac related.  PMH includes:  COPD, DM, colon CA, HTN, CAD, ischemic cardiomyopahty, tobacco abuse, chronic systolic CHF, NSVT, AICD ([facility]), thrombocytopenia, implantable cardioverter defibrillator implant, s/p CABG    PT Comments    Pt requires increase supplemental O2 during short distance ambulation with RW (25' total) up to 6-8L via [place]. See details below. Pt required increased physical A (up to mod A needed for transfers and gait due to decreased balance and decreased safety awareness). Pt requires 24/7 supervision for safety. Pt will continue to benefit from PT services to address impairments in strength, balance, gait, endurance, and overall functional mobility.    Follow Up Recommendations  LTACH;SNF     Equipment Recommendations  Rolling walker with 5" wheels;3in1 (PT);Other (comment) (O2)    Recommendations for Other Services       Precautions / Restrictions Precautions Precautions: Fall Precaution Comments: watch O2 sats Restrictions Weight Bearing Restrictions: No    Mobility  Bed Mobility Overal bed mobility: Needs Assistance Bed Mobility: Supine to Sit;Sit to Supine     Supine to sit: Mod assist Sit to supine: Min guard   General bed mobility comments: requires physical A with trunk to get to EOB and pt with posterior and R lateral lean in sitting  Transfers Overall transfer level: Needs assistance Equipment used: Rolling walker (2 wheeled) Transfers: Sit to/from Stand Sit to Stand: Mod assist Stand pivot transfers: Min assist;Mod  assist       General transfer comment: min to mod A needed to go from sit to stand and steady for balance. Cues for safety as impulsive with RW  Ambulation/Gait Ambulation/Gait assistance: Min assist;Mod assist Ambulation Distance (Feet): 25 Feet Assistive device: Rolling walker (2 wheeled)       General Gait Details: LOB to R on multiple occasions; decreased safety awareness and control with RW during turns and obstacle negotiation. Pt maintains flexed posture   Stairs            Wheelchair Mobility    Modified Rankin (Stroke Patients Only)       Balance Overall balance assessment: Needs assistance   Sitting balance-Leahy Scale: Poor Sitting balance - Comments: Pt required up to min A for sitting balance EOB with LOB posterior and to the R   Standing balance support: Bilateral upper extremity supported Standing balance-Leahy Scale: Poor                      Cognition Arousal/Alertness: Awake/alert Behavior During Therapy: WFL for tasks assessed/performed Overall Cognitive Status: Within Functional Limits for tasks assessed                      Exercises      General Comments General comments (skin integrity, edema, etc.): At rest O2 sats on 4L via [place] = 90-92%; With mobility required 6 LO2 as it dropped to 80%  and unable to get above 85% without increasing to 8 L. Once back to bed and resting x 5 min able to return to 4L with O2 = 94%      Pertinent  Vitals/Pain Pain Assessment: No/denies pain    Home Living                      Prior Function            PT Goals (current goals can now be found in the care plan section) Acute Rehab PT Goals PT Goal Formulation: With patient Time For Goal Achievement: 12/29/14 Potential to Achieve Goals: Fair Progress towards PT goals: Progressing toward goals    Frequency  Min 3X/week    PT Plan Current plan remains appropriate    Co-evaluation             End of Session  Equipment Utilized During Treatment: Gait belt;Oxygen Activity Tolerance: Other (comment) (limited due to O2 sats dropping) Patient left: in bed;with bed alarm set     Time: 1320-1350 PT Time Calculation (min) (ACUTE ONLY): 30 min  Charges:  $Gait Training: 8-22 mins $Therapeutic Activity: 8-22 mins                    G Codes:      Canary Brim Ivory Broad, PT, DPT Pager #: [phone]  12/21/2014, 1:59 PM

## 2014-12-21 NOTE — Progress Notes (Addendum)
Hypoglycemic Event  CBG: 29  Treatment: OJ  Symptoms: NONE  Follow-up CBG: Time: 0811 CBG Result:  28, patient was then given half amp of D50 IV by charge nurse Ramonita Lab his CBG then came up to 161 at 0841  Possible Reasons for Event: unknown  Comments/MD notified:    Mardi Mainland  Remember to initiate Hypoglycemia Order Set & complete

## 2014-12-21 NOTE — Progress Notes (Signed)
Family Medicine Teaching Service Daily Progress Note Intern Pager: [phone]  Patient name: James Moon Medical record number: [id number] Date of birth: [birth year] Age: 69 y.o. Gender: male  Primary Care Provider: Lamar Blinks, MD Consultants: CHF, CCM Code Status: partial code - DNI  Pt Overview and Major Events to Date:  6/15: Patient unresponsive in acute respiratory failure requiring BiPAP. Solu-medrol in ED 6/16: continued to be on BiPAP 6/17: Weaned to [facility] Iv 6/20: Desatting with movement  Assessment and Plan: James Moon is a 69 y.o. male presenting with AMS and respiratory failure . PMH is significant for COPD on home O2, chronic combined systolic and diastolic CHF, pulmonary HTN T2DM, tobacco abuse.  Acute on chronic respiratory failure, Respiratory acidosis in setting of CHF, pulmonary HTN, COPD: Presenting with altered mental status that has somewhat improved per EDP with BiPAP. Desats to 70s when taken off of BiPAP per ED (though not recorded). Afebrile, BP slightly soft to sBP in 100s, VS otherwise stable. ABG shows respiratory acidosis with pH 7.155, pCO2 100, pO2 319, bicarb 34 that improved slightly to pH 7.242, pCO2 82, and pO2 88.9 with BiPAP in the ED. Initial CXR shows chronic bronchitic changes with no infiltrates or edema. Initial troponin 0.06, EKG with LBBB (possibly new since last EKG), PVC, baseline wander, no ST elevations. Given changes in EKG and elevation in trops: 0.06>0.08>0.08, and significant cardiac history, ACS was ruled out. Additionally, BNP noted to be elevated at 1851 and patient's last EF was noted to be 25-30%, therefore a CHF exacerbation on his already poor cadiopulmonary stated (severe COPD with continued smoking) is the most likely etiology. Repeat CXR on 6/17 revealed mild persistent CHF and it was stable on 6/18. - Patient was able to be weaned to 3L overnight, however RN notes it has to be increased during the day with movement due to  desats - prednisone 40mg  D4/4, then taper 10mg  q 4 days, then decrease 10mg  each time to 10mg . - s/p 5 days of Levaquin daily. - PRN duonebs q4hr - CCM c/s: symbicort to budesonide/brovana while inpt, continue prednisone taper, pt not a great candidate for intubation  - cardiology c/s: patient appears dry on exam. Done with aggressive diuresis.  - Lactic acid improved: 2.33>2.5>3.2>2.2 - blood cultures (after levaquin) NGTD - Given end state COPD, CHF, and pulmonary HTN in the setting of continued smoking, palliative consult: rec SNF and would be candidate for hospice if desired - Discussed importance of goals of care with daughter and pt and poor prognosis. Both voice understanding.   Altered Mental Status: Likely 2/2 respiratory acidosis with hypercapnia as above.Patient oriented to person, place, and situation. Patient states he tripped over a rocker and fell. Does not recall harming himself or hitting his head. On neurologic exam, L pupil constricted, both minimally reactive to light. CT of head without acute abnormalities.  - Avoid sedating medications  Ischemic cardiomyopathy, CAD s/p CABG, CHF: Last Echo (3/16) with EF 25-30% and akinesis of apex, anterior septum and anterior wall.Has AICD in place. Appears euvolemic on exam - Dry weight appears to be 143-145lbs from outpt visits, last 08/2014. On admission was 158lbs.  - home statin, plavix, aspirin AP - restart home metoprolol today: watch HR, BP - does not appear to be on an ACE-i, however BPs soft at 105/59  - f/u SCr to determine whether to resume home Lasix - cardiology following, appreciate recs - daily weights: 158>159>149 >140 > 133 >126 >129> 131 -  Strict I&Os: +150cc  Concerns for aspiration: Patient's daughter reports choking and coughing when attempting to eat for last few months - SLP with no concerns for aspiration.  T2DM: Last A1c 10.5 (08/30/14). On SSI, 70/30 and Lantus at home per med rec. - monitor CBGs:  77-165 - sensitive SSI - 11 units yesterday - holding home 70/30 - continue Lantus 15 units qhs   Tobacco abuse: Daughter reports he smokes at least 1 PPD - Nicotine patch 21mg  - tobacco cessation counseling after mental status improves  L leg poorly healing wound, chronic venous stasis: Does not appear acutely infected. Seen at wound care clinic as OP per daughter. - Wound care c/s for management inpatient - elevate feet  Thrombocytopenia: Stable from previous hospitalizations. One note from 2005 notes possible hematology outpatient follow up, however unsure if this ever occurred. - continue to monitor  FEN/GI: Regular diet, SLIV Prophylaxis: SQ heparin  Disposition: D/c to SNF vs LTAC   Subjective:  Patient doing well. No SOB at rest, does admit to some DOE. Eating well. No chest pain. No cough.  Objective: Temp:  [97.7 F (36.5 C)-98.6 F (37 C)] 98.6 F (37 C) (06/21 2137) Pulse Rate:  [69-80] 80 (06/21 2317) Resp:  [16-18] 16 (06/21 2137) BP: (104-118)/(56-65) 105/59 mmHg (06/21 2317) SpO2:  [79 %-100 %] 79 % (06/21 2317) Physical Exam: General: sitting in bed, initially smiling, in NAD   Cardiovascular: RRR. No m/r/g noted. No LE edema b/l. Respiratory: No increased WOB. CTAB. No wheezing or rhonchi noted. Currently comfortable on 3L [place] sleeping. Abdomen: +BS, soft, ND/NT Extremities: Chronic venous stasis changes to the LEs b/l. 4x2cm full thickness ulcer on left lower extremity with minimal yellow drainage, covered by foam dressing.  Neuro: Alert. Oriented to person, place, and situation, but not to date (6/21). At rest appears to have minimal facial droop on R lip, however on neuro exam and with speaking, facial movements symmetric. 5/5 strength in the LE and UE b/l. L pupil slightly smaller compared to the R, reactive to light.  Laboratory:  Recent Labs Lab 12/17/14 0228 12/18/14 0312 12/19/14 0651  WBC 6.5 8.7 8.5  HGB 10.5* 12.2* 12.1*  HCT 37.7* 42.7  41.2  PLT 105* 122* 108*    Recent Labs Lab 12/14/14 1950  12/17/14 0228 12/18/14 0312 12/19/14 0651  NA  --   < > 137 138 138  K  --   < > 4.5 5.1 4.4  CL  --   < > 82* 82* 82*  CO2  --   < > 47* 43* 45*  BUN  --   < > 41* 47* 70*  CREATININE  --   < > 1.43* 1.51* 1.98*  CALCIUM  --   < > 9.4 9.7 8.8*  PROT 6.9  --   --   --   --   BILITOT 1.2  --   --   --   --   ALKPHOS 56  --   --   --   --   ALT 10*  --   --   --   --   AST 19  --   --   --   --   GLUCOSE  --   < > 355* 293* 213*  < > = values in this interval not displayed.Lactic acid: 2.33>2.5>3.2 >2.2  ABG    Component Value Date/Time   PHART 7.402 12/16/2014 0409   PCO2ART 65.3* 12/16/2014 0409   PO2ART 90.0 12/16/2014 0409  HCO3 39.8* 12/16/2014 0409   TCO2 41.8 12/16/2014 0409   O2SAT 96.8 12/16/2014 0409    Risk Stratification Labs  TSH    Component Value Date/Time   TSH 2.29 12/09/2012 1030   Hemoglobin A1C    Component Value Date/Time   HGBA1C 7.1* 12/15/2014 0505   Lipid Panel     Component Value Date/Time   CHOL 89 12/15/2014 0245   TRIG 60 12/15/2014 0245   HDL 42 12/15/2014 0245   CHOLHDL 2.1 12/15/2014 0245   VLDL 12 12/15/2014 0245   LDLCALC 35 12/15/2014 0245    Imaging/Diagnostic Tests: CXR: Chronic bronchitic changes. No definite infiltrates. Possible small effusions.  CXR: Developing mild interstitial edema. Slight increase in bibasilar atelectasis. Probable small right pleural effusion.  CXR 6/17: Mild persistent CHF.  CT head: Atrophy with small vessel chronic ischemic changes of deep cerebral white matter.No acute intracranial abnormalities.  CXR 6/18: Bibasilar airspace opacities may reflect pneumonia or pulmonary edema, similar in appearance to the prior study. Underlying vascular congestion noted.  Archie Patten, MD 12/21/2014, 6:26 AM PGY-1, [place] Intern pager: [phone], text pages welcome

## 2014-12-21 NOTE — Clinical Social Work Note (Signed)
[employer] representative Edwena Felty is determining if patient can go to [facility] at discharge due to the patient high oxygen requirements. [facility] has responded to CSW updates on patient stating that they can manage the patient with 8L during PT treatments but would need 24 hours to get the special concentrator that is needed for this. CSW has updated patient's daughter. Daughter is agreeable to waiting for response from [facility] and [facility] regarding placement at [facility] until noon tomorrow. If we have not received authorization for this, the patient will be discharged to [facility].    Liz Beach MSW, [place], [place], [phone]

## 2014-12-21 NOTE — Progress Notes (Signed)
Central monitoring notified me of a 12 beat run of Vtach that appeared true for pt.  I checked on pt and pt was resting comfortably and asymptomatic.  MD Netty notified.  No new orders at this time. Graceann Congress

## 2014-12-21 NOTE — Discharge Instructions (Signed)
You came in with severe respiratory distress. This was mostly secondary to both your bad lungs due to COPD and your heart.  The best thing you could do is STOP SMOKING.   Chronic Obstructive Pulmonary Disease Chronic obstructive pulmonary disease (COPD) is a common lung condition in which airflow from the lungs is limited. COPD is a general term that can be used to describe many different lung problems that limit airflow, including both chronic bronchitis and emphysema. If you have COPD, your lung function will probably never return to normal, but there are measures you can take to improve lung function and make yourself feel better.  CAUSES   Smoking (common).   Exposure to secondhand smoke.   Genetic problems.  Chronic inflammatory lung diseases or recurrent infections. SYMPTOMS   Shortness of breath, especially with physical activity.   Deep, persistent (chronic) cough with a large amount of thick mucus.   Wheezing.   Rapid breaths (tachypnea).   Gray or bluish discoloration (cyanosis) of the skin, especially in fingers, toes, or lips.   Fatigue.   Weight loss.   Frequent infections or episodes when breathing symptoms become much worse (exacerbations).   Chest tightness. DIAGNOSIS  Your health care provider will take a medical history and perform a physical examination to make the initial diagnosis. Additional tests for COPD may include:   Lung (pulmonary) function tests.  Chest X-ray.  CT scan.  Blood tests. TREATMENT  Treatment available to help you feel better when you have COPD includes:   Inhaler and nebulizer medicines. These help manage the symptoms of COPD and make your breathing more comfortable.  Supplemental oxygen. Supplemental oxygen is only helpful if you have a low oxygen level in your blood.   Exercise and physical activity. These are beneficial for nearly all people with COPD. Some people may also benefit from a pulmonary  rehabilitation program. HOME CARE INSTRUCTIONS   Take all medicines (inhaled or pills) as directed by your health care provider.  Avoid over-the-counter medicines or cough syrups that dry up your airway (such as antihistamines) and slow down the elimination of secretions unless instructed otherwise by your health care provider.   If you are a smoker, the most important thing that you can do is stop smoking. Continuing to smoke will cause further lung damage and breathing trouble. Ask your health care provider for help with quitting smoking. He or she can direct you to community resources or hospitals that provide support.  Avoid exposure to irritants such as smoke, chemicals, and fumes that aggravate your breathing.  Use oxygen therapy and pulmonary rehabilitation if directed by your health care provider. If you require home oxygen therapy, ask your health care provider whether you should purchase a pulse oximeter to measure your oxygen level at home.   Avoid contact with individuals who have a contagious illness.  Avoid extreme temperature and humidity changes.  Eat healthy foods. Eating smaller, more frequent meals and resting before meals may help you maintain your strength.  Stay active, but balance activity with periods of rest. Exercise and physical activity will help you maintain your ability to do things you want to do.  Preventing infection and hospitalization is very important when you have COPD. Make sure to receive all the vaccines your health care provider recommends, especially the pneumococcal and influenza vaccines. Ask your health care provider whether you need a pneumonia vaccine.  Learn and use relaxation techniques to manage stress.  Learn and use controlled breathing techniques as  directed by your health care provider. Controlled breathing techniques include:   Pursed lip breathing. Start by breathing in (inhaling) through your nose for 1 second. Then, purse your  lips as if you were going to whistle and breathe out (exhale) through the pursed lips for 2 seconds.   Diaphragmatic breathing. Start by putting one hand on your abdomen just above your waist. Inhale slowly through your nose. The hand on your abdomen should move out. Then purse your lips and exhale slowly. You should be able to feel the hand on your abdomen moving in as you exhale.   Learn and use controlled coughing to clear mucus from your lungs. Controlled coughing is a series of short, progressive coughs. The steps of controlled coughing are:  1. Lean your head slightly forward.  2. Breathe in deeply using diaphragmatic breathing.  3. Try to hold your breath for 3 seconds.  4. Keep your mouth slightly open while coughing twice.  5. Spit any mucus out into a tissue.  6. Rest and repeat the steps once or twice as needed. SEEK MEDICAL CARE IF:   You are coughing up more mucus than usual.   There is a change in the color or thickness of your mucus.   Your breathing is more labored than usual.   Your breathing is faster than usual.  SEEK IMMEDIATE MEDICAL CARE IF:   You have shortness of breath while you are resting.   You have shortness of breath that prevents you from:  Being able to talk.   Performing your usual physical activities.   You have chest pain lasting longer than 5 minutes.   Your skin color is more cyanotic than usual.  You measure low oxygen saturations for longer than 5 minutes with a pulse oximeter. MAKE SURE YOU:   Understand these instructions.  Will watch your condition.  Will get help right away if you are not doing well or get worse. Document Released: 03/27/2005 Document Revised: 11/01/2013 Document Reviewed: 02/11/2013 [facility] Patient Information 2015 [place], [place]. This information is not intended to replace advice given to you by your health care provider. Make sure you discuss any questions you have with your health care  provider.

## 2014-12-21 NOTE — Progress Notes (Signed)
Per CSW, he was told that [employer] insurance is not in net work with long term acute care facilities.  CSW will work up for snf.  Physical therapy working with patient now to see how his saturations are with exertion.

## 2014-12-22 ENCOUNTER — Telehealth: Payer: Self-pay | Admitting: Family Medicine

## 2014-12-22 LAB — BASIC METABOLIC PANEL
Anion gap: 13 (ref 5–15)
BUN: 101 mg/dL — ABNORMAL HIGH (ref 6–20)
CO2: 45 mmol/L — ABNORMAL HIGH (ref 22–32)
Calcium: 8.8 mg/dL — ABNORMAL LOW (ref 8.9–10.3)
Chloride: 84 mmol/L — ABNORMAL LOW (ref 101–111)
Creatinine, Ser: 2.56 mg/dL — ABNORMAL HIGH (ref 0.61–1.24)
GFR calc Af Amer: 28 mL/min — ABNORMAL LOW (ref >60)
GFR, EST NON AFRICAN AMERICAN: 24 mL/min — AB (ref >60)
Glucose, Bld: 219 mg/dL — ABNORMAL HIGH (ref 65–99)
POTASSIUM: 4.1 mmol/L (ref 3.5–5.1)
Sodium: 142 mmol/L (ref 135–145)

## 2014-12-22 LAB — MAGNESIUM: MAGNESIUM: 2.5 mg/dL — AB (ref 1.7–2.4)

## 2014-12-22 LAB — GLUCOSE, CAPILLARY
Glucose-Capillary: 193 mg/dL — ABNORMAL HIGH (ref 65–99)
Glucose-Capillary: 188 mg/dL — ABNORMAL HIGH (ref 65–99)

## 2014-12-22 MED ORDER — FUROSEMIDE 40 MG PO TABS: 40.0000 mg | ORAL_TABLET | ORAL | Status: DC

## 2014-12-22 MED ORDER — PREDNISONE 10 MG PO TABS: ORAL_TABLET | ORAL | Status: DC

## 2014-12-22 NOTE — Progress Notes (Signed)
Pt prepared for d/c to SNF. IV d/c'd. Skin intact except as most recently charted. Vitals are stable. Report called to receiving facility. Pt to be transported by ambulance service. 

## 2014-12-22 NOTE — Telephone Encounter (Signed)
[facility] Pt had requested to be sent to [facility] but it was denied. If Dr Mingo Amber wants to have a peer to peer review, please contact Edwena Felty

## 2014-12-22 NOTE — Telephone Encounter (Signed)
Its being taken care of on the inpatient side.  Thanks, Archie Patten, MD [facility] Family Medicine Resident  12/22/2014, 1:09 PM

## 2014-12-22 NOTE — Clinical Social Work Note (Signed)
Clinical Social Worker facilitated patient discharge including contacting patient family and facility to confirm patient discharge plans.  Clinical information faxed to facility and family agreeable with plan.  Insurance authorization received from [employer]  CSW arranged ambulance transport via [facility] to [facility].  RN to call report prior to discharge.  Clinical Social Worker will sign off for now as social work intervention is no longer needed. Please consult [name] again if new need arises.  Barbette Or, [place]

## 2014-12-22 NOTE — Telephone Encounter (Signed)
Pt is not established with [name].  He is admitted on our service at this time.  Will forward to resident who is working with him. James Moon, [name]

## 2014-12-22 NOTE — Care Management Note (Signed)
Case Management Note  Patient Details  Name: James Moon MRN: [id number] Date of Birth: 08/11/1945  Subjective/Objective:         Patient is for dc to [facility], [facility] following.           Action/Plan:   Expected Discharge Date:                  Expected Discharge Plan:  [place]  In-House Referral:  Clinical Social Work  Discharge planning Services  CM Consult  Post Acute Care Choice:    Choice offered to:     DME Arranged:  Oxygen (active with advance homecare services) DME Agency:     [place]:    [place] Agency:     Status of Service:  Completed, signed off  Medicare Important Message Given:  Yes Date Medicare IM Given:  12/19/14 Medicare IM give by:  Tomi Bamberger RN Date Additional Medicare IM Given:  12/22/14 Additional Medicare Important Message give by:  Tomi Bamberger RN  If discussed at Long Length of Stay Meetings, dates discussed:    Additional Comments:  Zenon Mayo, RN 12/22/2014, 1:25 PM

## 2014-12-22 NOTE — Progress Notes (Signed)
Family Medicine Teaching Service Daily Progress Note Intern Pager: [phone]  Patient name: James Moon Medical record number: [id number] Date of birth: May 03, 1946 Age: 69 y.o. Gender: male  Primary Care Provider: Lamar Blinks, MD Consultants: CHF, CCM Code Status: partial code - DNI  Pt Overview and Major Events to Date:  6/15: Patient unresponsive in acute respiratory failure requiring BiPAP. Solu-medrol in ED 6/16: continued to be on BiPAP 6/17: Weaned to [facility] 6/20: Desatting with movement 6/22: Able to participate with PT with 6-8L [place].   Assessment and Plan: James Moon is a 69 y.o. male presenting with AMS and respiratory failure . PMH is significant for COPD on home O2, chronic combined systolic and diastolic CHF, pulmonary HTN T2DM, tobacco abuse.  Acute on chronic respiratory failure, Respiratory acidosis in setting of CHF, pulmonary HTN, COPD: Presenting with altered mental status that has somewhat improved per EDP with BiPAP. Desats to 70s when taken off of BiPAP per ED (though not recorded). Afebrile, BP slightly soft to SBP in 100s, VS otherwise stable. ABG shows respiratory acidosis with pH 7.155, pCO2 100, pO2 319, bicarb 34 that improved slightly to pH 7.242, pCO2 82, and pO2 88.9 with BiPAP in the ED. Initial CXR shows chronic bronchitic changes with no infiltrates or edema. Initial troponin 0.06, EKG with LBBB (possibly new since last EKG), PVC, baseline wander, no ST elevations. Given changes in EKG and elevation in trops: 0.06>0.08>0.08, and significant cardiac history, ACS was ruled out. Additionally, BNP noted to be elevated at 1851 and patient's last EF was noted to be 25-30%, therefore a CHF exacerbation on his already poor cadiopulmonary state (severe COPD with continued smoking) is the most likely etiology. Repeat CXR on 6/17 revealed mild persistent CHF and it was stable on 6/18. - Patient was able to be weaned to 4L overnight, however requires 6-8L [place] with  PT - prednisone 30mg  D1/4, taper 10mg  q 4 days, down to10mg . - s/p 5 days of Levaquin daily. - PRN duonebs q4hr - CCM c/s: symbicort to budesonide/brovana while inpt, continue prednisone taper, pt not a great candidate for intubation  - cardiology no longer following - Lactic acid improved: 2.33>2.5>3.2>2.2 - blood cultures (after levaquin) NGTD - Given end state COPD, CHF, and pulmonary HTN in the setting of continued smoking, palliative consult: rec SNF and would be candidate for hospice if desired - Discussed importance of goals of care with daughter and pt and poor prognosis. Both voice understanding.   Altered Mental Status: Likely 2/2 respiratory acidosis with hypercapnia as above.Patient oriented to person, place, and situation. Patient states he tripped over a rocker and fell. Does not recall harming himself or hitting his head. On neurologic exam, L pupil constricted, both minimally reactive to light. CT of head without acute abnormalities.  - Avoid sedating medications  Ischemic cardiomyopathy, CAD s/p CABG, CHF: Last Echo (3/16) with EF 25-30% and akinesis of apex, anterior septum and anterior wall.Has AICD in place. Appears euvolemic on exam - Dry weight appears to be 143-145lbs from outpt visits, last 08/2014. On admission was 158lbs.  - home statin, plavix, aspirin AP - restart home metoprolol today: watch HR, BP - does not appear to be on an ACE-i, however BPs soft at 105/59  - will continue to hold lasix 2/2 SCr. - cardiology following, appreciate recs - daily weights: 158>159>149 >140 > 133 >126 >129> 131 >not recorded. -  Strict I&Os: +700cc   Concerns for aspiration: Patient's daughter reports choking and coughing when attempting to  eat for last few months - SLP with no concerns for aspiration.  T2DM: Last A1c 10.5 (08/30/14). On SSI, 70/30 and Lantus at home per med rec. Patient noted to have an episode of hypoglycemia on 6/22, down to 29- was asymptomatic. Resolved with  1amp D50.  - monitor CBGs:29-320 - sensitive SSI - 9 units yesterday - holding home 70/30 - decreased Lantus 15 to 10 units qhs given hypoglycemia. - will d/c on Lantus 10u and HOLD 70/30.   Tobacco abuse: Daughter reports he smokes at least 1 PPD - Nicotine patch 21mg  - tobacco cessation counseling after mental status improves  L leg poorly healing wound, chronic venous stasis: Does not appear acutely infected. Seen at wound care clinic as OP per daughter. - Wound care c/s for management inpatient - elevate feet  Thrombocytopenia: Stable from previous hospitalizations. One note from 2005 notes possible hematology outpatient follow up, however unsure if this ever occurred. - continue to monitor  FEN/GI: Regular diet, SLIV Prophylaxis: SQ heparin  Disposition: D/c to SNF vs LTAC   Subjective:  Patient doing well. No SOB.  Objective: Temp:  [97.8 F (36.6 C)-98.3 F (36.8 C)] 98.3 F (36.8 C) (06/22 2158) Pulse Rate:  [67-81] 81 (06/22 2158) Resp:  [14-18] 18 (06/22 2158) BP: (105-112)/(54-65) 112/59 mmHg (06/22 2158) SpO2:  [94 %-100 %] 94 % (06/22 2158) Physical Exam: General: sitting in bed eating in NAD   Cardiovascular: RRR. No m/r/g noted. No LE edema b/l. Respiratory: No increased WOB. CTAB. No wheezing or rhonchi noted. Currently comfortable on 5L [place] sleeping. Abdomen: +BS, soft, ND/NT Extremities: Chronic venous stasis changes to the LEs b/l. 4x2cm full thickness ulcer on left lower extremity with minimal yellow drainage, covered by foam dressing.  Neuro: Alert&Oriented. No gross deficits.   Laboratory:  Recent Labs Lab 12/17/14 0228 12/18/14 0312 12/19/14 0651  WBC 6.5 8.7 8.5  HGB 10.5* 12.2* 12.1*  HCT 37.7* 42.7 41.2  PLT 105* 122* 108*    Recent Labs Lab 12/18/14 0312 12/19/14 0651 12/21/14 1159  NA 138 138 143  K 5.1 4.4 4.2  CL 82* 82* 86*  CO2 43* 45* 45*  BUN 47* 70* 95*  CREATININE 1.51* 1.98* 2.53*  CALCIUM 9.7 8.8* 8.5*  GLUCOSE  293* 213* 158*  Lactic acid: 2.33>2.5>3.2 >2.2  ABG    Component Value Date/Time   PHART 7.402 12/16/2014 0409   PCO2ART 65.3* 12/16/2014 0409   PO2ART 90.0 12/16/2014 0409   HCO3 39.8* 12/16/2014 0409   TCO2 41.8 12/16/2014 0409   O2SAT 96.8 12/16/2014 0409    Risk Stratification Labs  TSH    Component Value Date/Time   TSH 2.29 12/09/2012 1030   Hemoglobin A1C    Component Value Date/Time   HGBA1C 7.1* 12/15/2014 0505   Lipid Panel     Component Value Date/Time   CHOL 89 12/15/2014 0245   TRIG 60 12/15/2014 0245   HDL 42 12/15/2014 0245   CHOLHDL 2.1 12/15/2014 0245   VLDL 12 12/15/2014 0245   LDLCALC 35 12/15/2014 0245    Imaging/Diagnostic Tests: CXR: Chronic bronchitic changes. No definite infiltrates. Possible small effusions.  CXR: Developing mild interstitial edema. Slight increase in bibasilar atelectasis. Probable small right pleural effusion.  CXR 6/17: Mild persistent CHF.  CT head: Atrophy with small vessel chronic ischemic changes of deep cerebral white matter.No acute intracranial abnormalities.  CXR 6/18: Bibasilar airspace opacities may reflect pneumonia or pulmonary edema, similar in appearance to the prior study. Underlying vascular congestion  noted.  Archie Patten, MD 12/22/2014, 7:03 AM PGY-1, [place] Intern pager: [phone], text pages welcome

## 2014-12-22 NOTE — Clinical Social Work Placement (Signed)
   CLINICAL SOCIAL WORK PLACEMENT  NOTE  Date:  12/22/2014  Patient Details  Name: James Moon MRN: [id number] Date of Birth: October 04, 1945  Clinical Social Work is seeking post-discharge placement for this patient at the [place] level of care (*CSW will initial, date and re-position this form in  chart as items are completed):  Yes   Patient/family provided with [place] Work Department's list of facilities offering this level of care within the geographic area requested by the patient (or if unable, by the patient's family).  Yes   Patient/family informed of their freedom to choose among providers that offer the needed level of care, that participate in Medicare, Medicaid or managed care program needed by the patient, have an available bed and are willing to accept the patient.  Yes   Patient/family informed of [place]'s ownership interest in [facility] and [facility], as well as of the fact that they are under no obligation to receive care at these facilities.  PASRR submitted to EDS on 12/19/14     PASRR number received on 12/19/14     Existing PASRR number confirmed on       FL2 transmitted to all facilities in geographic area requested by pt/family on 12/19/14     FL2 transmitted to all facilities within larger geographic area on       Patient informed that his/her managed care company has contracts with or will negotiate with certain facilities, including the following:        Yes   Patient/family informed of bed offers received.  Patient chooses bed at [facility]     Physician recommends and patient chooses bed at      Patient to be transferred to [facility] on 12/22/14.  Patient to be transferred to facility by ambulance     Patient family notified on 12/22/14 of transfer.  Name of family member notified:  Lenna Sciara (patient daughter over the phone)     PHYSICIAN       Additional Comment:    Barbette Or,  [place]

## 2014-12-26 ENCOUNTER — Non-Acute Institutional Stay: Payer: PPO | Admitting: Internal Medicine

## 2014-12-26 DIAGNOSIS — J449 Chronic obstructive pulmonary disease, unspecified: Secondary | ICD-10-CM

## 2014-12-26 DIAGNOSIS — I2510 Atherosclerotic heart disease of native coronary artery without angina pectoris: Secondary | ICD-10-CM

## 2014-12-26 DIAGNOSIS — I5042 Chronic combined systolic (congestive) and diastolic (congestive) heart failure: Secondary | ICD-10-CM

## 2014-12-26 DIAGNOSIS — J9622 Acute and chronic respiratory failure with hypercapnia: Secondary | ICD-10-CM | POA: Diagnosis not present

## 2014-12-26 DIAGNOSIS — N179 Acute kidney failure, unspecified: Secondary | ICD-10-CM | POA: Diagnosis not present

## 2014-12-26 DIAGNOSIS — R5381 Other malaise: Secondary | ICD-10-CM | POA: Diagnosis not present

## 2014-12-26 NOTE — Progress Notes (Signed)
Patient ID: James Moon, male   DOB: December 02, 1945, 69 y.o.   MRN: [id number]     Facility: [facility]    PCP: Lamar Blinks, MD  Code Status: DNR  No Known Allergies  Chief Complaint  Patient presents with  . New Admit To SNF     HPI:  69 year old patient is here for short term rehabilitation post hospital admission from 12/14/14-12/22/14 with acute on chronic respiratory failure from copd and chf exacerbation. he responded well to antibiotics, steroids and diuretics. He is seen in his room today. He feels tired. His appetite is fair. He has been working with therapy team at present. He continues to be on oxygen.   Review of Systems:  Constitutional: Negative for fever, chills, diaphoresis.  HENT: Negative for headache, congestion, nasal discharge Eyes: Negative for eye pain, blurred vision, double vision and discharge.  Respiratory: Negative for cough, shortness of breath and wheezing.   Cardiovascular: Negative for chest pain, palpitations, leg swelling.  Gastrointestinal: Negative for heartburn, nausea, vomiting, abdominal pain Genitourinary: Negative for dysuria, urgency, frequency Musculoskeletal: Negative for back pain, falls Skin: Negative for itching, rash.  Neurological: Negative for dizziness, tingling, focal weakness Psychiatric/Behavioral: Negative for depression   Past Medical History  Diagnosis Date  . Diabetes mellitus   . Colon cancer     s/p colectomy   . Hypertension   . CAD (coronary artery disease)     a. CABG in 1996. b. NSTEMI 06/2011: DES x 2 to SVG to 1st and 2nd OM and SVG to PDA. c. 09/2013: NSTEMI s/p DES to prox SVG-OM1, NCPTCA of mSVG-OM1.  . Ischemic cardiomyopathy Dec. 2012    a. EF 20 to 25% 2012. b. 30-35% echo 3/13. c. EF 40-45% by echo 09/2013 but then at time of NSTEMI was 20% by cath later that month.  . Tobacco abuse   . COPD (chronic obstructive pulmonary disease)   . Chronic systolic CHF (congestive  heart failure)   . Chronic respiratory failure     a. On home O2 - due to COPD.  Marland Kitchen NSVT (nonsustained ventricular tachycardia)   . Hypotension   . AICD ([address] [place])   . Thrombocytopenia   . Pulmonary HTN   . Syncope     a. 03/2013 in setting of hypotension.  . Venous stasis ulcers of both lower extremities 08/2014   Past Surgical History  Procedure Laterality Date  . Colon surgery    . Open heart surgery  01/1995  . Coronary stent placement  Dec. 2012    DES to SVG to 1st and 2nd OM and SVG to the PDA  . Coronary artery bypass graft  01/1995  . Coronary angioplasty    . Colon surgery      FOR HISTORY OF COLON CANCER  . Left heart catheterization with coronary angiogram N/A 06/10/2011    Procedure: LEFT HEART CATHETERIZATION WITH CORONARY ANGIOGRAM;  Surgeon: Minus Breeding, MD;  Location: [facility] CATH LAB;  Service: Cardiovascular;  Laterality: N/A;  . Graft(s) angiogram  06/10/2011    Procedure: GRAFT(S) Cyril Loosen;  Surgeon: Minus Breeding, MD;  Location: [facility] CATH LAB;  Service: Cardiovascular;;  . Percutaneous coronary stent intervention (pci-s) N/A 06/11/2011    Procedure: PERCUTANEOUS CORONARY STENT INTERVENTION (PCI-S);  Surgeon: Peter M Martinique, MD;  Location: [facility] CATH LAB;  Service: Cardiovascular;  Laterality: N/A;  . Implantable cardioverter defibrillator implant N/A 12/11/2011    Procedure: IMPLANTABLE CARDIOVERTER DEFIBRILLATOR IMPLANT;  Surgeon: Deboraha Sprang, MD;  Location: [place] CATH LAB;  Service: Cardiovascular;  Laterality: N/A;  . Left and right heart catheterization with coronary/graft angiogram N/A 10/20/2013    Procedure: LEFT AND RIGHT HEART CATHETERIZATION WITH Beatrix Fetters;  Surgeon: Troy Sine, MD;  Location: [facility] C[facility] LAB;  Service: Cardiovascular;  Laterality: N/A;   Social History:   reports that he has been smoking Cigarettes.  He started smoking about 2 years ago. He has a 25 pack-year smoking history. He has never used smokeless tobacco. He reports that  he does not drink alcohol or use illicit drugs.  Family History  Problem Relation Age of Onset  . Heart attack Father   . Diabetes Father   . Breast cancer Mother     Medications: Patient's Medications  New Prescriptions   No medications on file  Previous Medications   ASPIRIN EC 81 MG TABLET    Take 81 mg by mouth daily.    ATORVASTATIN (LIPITOR) 40 MG TABLET    TAKE ONE TABLET BY MOUTH ONCE DAILY.   CLOPIDOGREL (PLAVIX) 75 MG TABLET    Take 1 tablet (75 mg total) by mouth daily.   COLLAGENASE (SANTYL) OINTMENT    Apply topically daily.   FUROSEMIDE (LASIX) 40 MG TABLET    Take 1 tablet (40 mg total) by mouth every other day.   INSULIN GLARGINE (LANTUS SOLOSTAR) 100 UNIT/ML SOLOSTAR PEN    Inject 10 Units into the skin daily at 10 pm.   IPRATROPIUM-ALBUTEROL (DUONEB) 0.5-2.5 (3) MG/3ML SOLN    Take 3 mLs by nebulization every 6 (six) hours as needed.   METOPROLOL TARTRATE (LOPRESSOR) 25 MG TABLET    Take 0.5 tablets (12.5 mg total) by mouth 2 (two) times daily.   NEEDLE, DISP, 22 G (B-D DISP NEEDLE 22GX3/4") 22G X 3/4" MISC    1 Syringe by Does not apply route daily.   NITROGLYCERIN (NITROSTAT) 0.4 MG SL TABLET    Place 1 tablet (0.4 mg total) under the tongue every 5 (five) minutes as needed for chest pain. For chest pain   PREDNISONE (DELTASONE) 10 MG TABLET    Starting 12/23/2014, take 30mg  (3 pills) for 3 days; then 20mg  (2 pills) for 4 days; then 10mg  (1 pill) daily   RELION INSULIN SYR 0.3ML/31G 31G X 5/16" 0.3 ML MISC    USE THREE TIMES DAILY   SYMBICORT 160-4.5 MCG/ACT INHALER    INHALE TWO PUFFS BY MOUTH TWICE DAILY  Modified Medications   No medications on file  Discontinued Medications   No medications on file     Physical Exam: Filed Vitals:   12/26/14 1430  BP: 115/61  Pulse: 83  Temp: 97.4 F (36.3 C)  Resp: 18  SpO2: 93%    General- elderly male, in no acute distress Head- normocephalic, atraumatic Throat- moist mucus membrane Eyes- PERRLA, EOMI, no  pallor, no icterus, no discharge, normal conjunctiva, normal sclera Neck- no cervical lymphadenopathy Cardiovascular- normal s1,s2, no murmurs, palpable dorsalis pedis, no leg edema Respiratory- bilateral poor air entry, no wheeze, no rhonchi, no crackles, no use of accessory muscles Abdomen- bowel sounds present, soft, non tender Musculoskeletal- able to move all 4 extremities, generalized weakness Neurological- no focal deficit Skin- warm and dry, easy bruising Psychiatry- alert and oriented to person, place, normal mood and affect    Labs reviewed: Basic Metabolic Panel:  Recent Labs  09/03/14 0236  12/19/14 0651 12/21/14 1159 12/22/14 0641  NA 136  < > 138 143 142  K 4.6  < >  4.4 4.2 4.1  CL 90*  < > 82* 86* 84*  CO2 39*  < > 45* 45* 45*  GLUCOSE 332*  < > 213* 158* 219*  BUN 30*  < > 70* 95* 101*  CREATININE 1.31  < > 1.98* 2.53* 2.56*  CALCIUM 8.6  < > 8.8* 8.5* 8.8*  MG 1.9  --   --   --  2.5*  PHOS 3.0  --   --   --   --   < > = values in this interval not displayed. Liver Function Tests:  Recent Labs  08/31/14 0420 09/01/14 0500 12/14/14 1950  AST 24 21 19   ALT 18 15 10*  ALKPHOS 57 54 56  BILITOT 0.7 1.0 1.2  PROT 6.7 5.9* 6.9  ALBUMIN 2.8* 2.6* 3.3*   No results for input(s): LIPASE, AMYLASE in the last 8760 hours. No results for input(s): AMMONIA in the last 8760 hours. CBC:  Recent Labs  09/01/14 0500 09/02/14 0411 09/03/14 0236  12/17/14 0228 12/18/14 0312 12/19/14 0651  WBC 7.5 6.5 4.8  < > 6.5 8.7 8.5  NEUTROABS 6.1 5.2 3.8  --   --   --   --   HGB 11.8* 11.7* 11.6*  < > 10.5* 12.2* 12.1*  HCT 41.8 40.6 40.3  < > 37.7* 42.7 41.2  MCV 83.3 81.5 83.1  < > 82.7 82.6 80.2  PLT 114* 119* 117*  < > 105* 122* 108*  < > = values in this interval not displayed. Cardiac Enzymes:  Recent Labs  12/15/14 0012 12/15/14 0505 12/15/14 1010  TROPONINI 0.08* 0.08* 0.09*   BNP: Invalid input(s): POCBNP CBG:  Recent Labs  12/21/14 2157  12/22/14 0757 12/22/14 1156  GLUCAP 320* 193* 188*     Assessment/Plan  Physical deconditioning Will have him work with physical therapy and occupational therapy team to help with gait training and muscle strengthening exercises.fall precautions. Skin care. Encourage to be out of bed.   Acute on Chronic respiratory failure From copd and chf exacerbation. Responded well to diuresis and antibiotics. Continue o2 by nasal canula.   CHF Appears euvolemic on exam. Continue lasix 40 mg every other day with lopressor 12.5 mg bid, monitor weight  COPD Breathing stable, continue o2, prednisone tapering course until 12/30/14 and then 10 mg daily. Continue symbicort and prn duonebs  Acute renal failure Monitor renal function given the renal impairment, on lasix  Type 2 dm Monitor cbg, elevated readings in the facility.currently on lantus 15 u daily, SSI humalog. Increase lantus to 20 u daily for now. Continue aspirin and statin Lab Results  Component Value Date   HGBA1C 7.1* 12/15/2014   CAD Remains chest pain free. Continue aspirin 81 mg daily, plavix 75 mg daily, lipitor 40 mg daily, lopressor 12.5 mg bid and prn NTG  Goals of care: short term rehabilitation   Labs/tests ordered: bmp in 1 week  Family/ staff Communication: reviewed care plan with patient and nursing supervisor    Blanchie Serve, MD  [place] [phone] (Monday-Friday 8 am - 5 pm) [phone] (afterhours)

## 2015-01-04 ENCOUNTER — Encounter: Payer: Self-pay | Admitting: Nurse Practitioner

## 2015-01-04 ENCOUNTER — Non-Acute Institutional Stay: Payer: PPO | Admitting: Nurse Practitioner

## 2015-01-04 DIAGNOSIS — N179 Acute kidney failure, unspecified: Secondary | ICD-10-CM

## 2015-01-04 DIAGNOSIS — J449 Chronic obstructive pulmonary disease, unspecified: Secondary | ICD-10-CM

## 2015-01-04 DIAGNOSIS — J9622 Acute and chronic respiratory failure with hypercapnia: Secondary | ICD-10-CM | POA: Diagnosis not present

## 2015-01-04 NOTE — Progress Notes (Signed)
Patient ID: James Moon, male   DOB: Jul 06, 1945, 69 y.o.   MRN: [id number]    Nursing Home Location:  [place] of Service: SNF (31)  PCP: Lamar Blinks, MD  No Known Allergies  Chief Complaint  Patient presents with  . Acute Visit    HPI:  Patient is a 69 y.o. male seen today at [facility] due to decline in condition and decisions for comfort care. Pt with pmh of CHF, COPD, DM, renal failure. Pt recently admitted to [facility] for rehab after hospitalization for acute respiratory failure. Pt was unable to wean down to his home O2, palliative care was consulted in the hospital to discuss goals of care. Recent labs revealed worsening renal function and pt and daughter did not wish to go back to hospital therefore wit was decided to start comfort measures at [facility] place. Pt is currently in bed resting, daughter at bedside. No signs of distress or discomfort. Daughter has reported use of morphine once today. Pt denies worsening shortness of breath or pain.   Review of Systems:  Review of Systems  Constitutional: Positive for activity change, appetite change (not eating, rarely drinks) and fatigue.  Eyes: Negative.   Respiratory: Negative for cough and shortness of breath.   Cardiovascular: Negative for chest pain, palpitations and leg swelling.  Gastrointestinal: Negative for abdominal pain, diarrhea and constipation.  Genitourinary: Negative for dysuria and difficulty urinating.  Musculoskeletal: Negative for myalgias and arthralgias.  Skin: Negative for color change and wound.  Neurological: Negative for dizziness and weakness.  Psychiatric/Behavioral: Positive for confusion. Negative for behavioral problems and agitation.    Past Medical History  Diagnosis Date  . Diabetes mellitus   . Colon cancer     s/p colectomy   . Hypertension   . CAD (coronary artery disease)     a. CABG in 1996. b. NSTEMI 06/2011: DES x 2 to SVG to 1st  and 2nd OM and SVG to PDA. c. 09/2013: NSTEMI s/p DES to prox SVG-OM1, NCPTCA of mSVG-OM1.  . Ischemic cardiomyopathy Dec. 2012    a. EF 20 to 25% 2012. b. 30-35% echo 3/13. c. EF 40-45% by echo 09/2013 but then at time of NSTEMI was 20% by cath later that month.  . Tobacco abuse   . COPD (chronic obstructive pulmonary disease)   . Chronic systolic CHF (congestive heart failure)   . Chronic respiratory failure     a. On home O2 - due to COPD.  Marland Kitchen NSVT (nonsustained ventricular tachycardia)   . Hypotension   . AICD ([address] [place])   . Thrombocytopenia   . Pulmonary HTN   . Syncope     a. 03/2013 in setting of hypotension.  . Venous stasis ulcers of both lower extremities 08/2014   Past Surgical History  Procedure Laterality Date  . Colon surgery    . Open heart surgery  01/1995  . Coronary stent placement  Dec. 2012    DES to SVG to 1st and 2nd OM and SVG to the PDA  . Coronary artery bypass graft  01/1995  . Coronary angioplasty    . Colon surgery      FOR HISTORY OF COLON CANCER  . Left heart catheterization with coronary angiogram N/A 06/10/2011    Procedure: LEFT HEART CATHETERIZATION WITH CORONARY ANGIOGRAM;  Surgeon: Minus Breeding, MD;  Location: [facility] CATH LAB;  Service: Cardiovascular;  Laterality: N/A;  . Graft(s) angiogram  06/10/2011  Procedure: GRAFT(S) ANGIOGRAM;  Surgeon: Minus Breeding, MD;  Location: [facility] CATH LAB;  Service: Cardiovascular;;  . Percutaneous coronary stent intervention (pci-s) N/A 06/11/2011    Procedure: PERCUTANEOUS CORONARY STENT INTERVENTION (PCI-S);  Surgeon: Peter M Martinique, MD;  Location: [facility] CATH LAB;  Service: Cardiovascular;  Laterality: N/A;  . Implantable cardioverter defibrillator implant N/A 12/11/2011    Procedure: IMPLANTABLE CARDIOVERTER DEFIBRILLATOR IMPLANT;  Surgeon: Deboraha Sprang, MD;  Location: [facility] CATH LAB;  Service: Cardiovascular;  Laterality: N/A;  . Left and right heart catheterization with coronary/graft angiogram N/A 10/20/2013     Procedure: LEFT AND RIGHT HEART CATHETERIZATION WITH Beatrix Fetters;  Surgeon: Troy Sine, MD;  Location: [facility] CATH LAB;  Service: Cardiovascular;  Laterality: N/A;   Social History:   reports that he has been smoking Cigarettes.  He started smoking about 2 years ago. He has a 25 pack-year smoking history. He has never used smokeless tobacco. He reports that he does not drink alcohol or use illicit drugs.  Family History  Problem Relation Age of Onset  . Heart attack Father   . Diabetes Father   . Breast cancer Mother     Medications: Patient's Medications  New Prescriptions   No medications on file  Previous Medications   IPRATROPIUM-ALBUTEROL (DUONEB) 0.5-2.5 (3) MG/3ML SOLN    Take 3 mLs by nebulization every 6 (six) hours as needed.   LORAZEPAM (ATIVAN) 2 MG/ML CONCENTRATED SOLUTION    0.5 mg. Every hour as needed   METOPROLOL TARTRATE (LOPRESSOR) 25 MG TABLET    Take 0.5 tablets (12.5 mg total) by mouth 2 (two) times daily.   MORPHINE (ROXANOL) 20 MG/ML CONCENTRATED SOLUTION    Take 5 mg by mouth every 2 (two) hours as needed for severe pain (For pain or distress).   NITROGLYCERIN (NITROSTAT) 0.4 MG SL TABLET    Place 1 tablet (0.4 mg total) under the tongue every 5 (five) minutes as needed for chest pain. For chest pain  Modified Medications   No medications on file  Discontinued Medications   ASPIRIN EC 81 MG TABLET    Take 81 mg by mouth daily.    ATORVASTATIN (LIPITOR) 40 MG TABLET    TAKE ONE TABLET BY MOUTH ONCE DAILY.   CLOPIDOGREL (PLAVIX) 75 MG TABLET    Take 1 tablet (75 mg total) by mouth daily.   COLLAGENASE (SANTYL) OINTMENT    Apply topically daily.   FUROSEMIDE (LASIX) 40 MG TABLET    Take 1 tablet (40 mg total) by mouth every other day.   INSULIN GLARGINE (LANTUS SOLOSTAR) 100 UNIT/ML SOLOSTAR PEN    Inject 10 Units into the skin daily at 10 pm.   NEEDLE, DISP, 22 G (B-D DISP NEEDLE 22GX3/4") 22G X 3/4" MISC    1 Syringe by Does not apply route daily.     PREDNISONE (DELTASONE) 10 MG TABLET    Starting 12/23/2014, take 30mg  (3 pills) for 3 days; then 20mg  (2 pills) for 4 days; then 10mg  (1 pill) daily   RELION INSULIN SYR 0.3ML/31G 31G X 5/16" 0.3 ML MISC    USE THREE TIMES DAILY   SYMBICORT 160-4.5 MCG/ACT INHALER    INHALE TWO PUFFS BY MOUTH TWICE DAILY     Physical Exam: Filed Vitals:   01/04/15 1041  BP: 105/64  Pulse: 75  Temp: 98.6 F (37 C)  TempSrc: Oral  Resp: 19  Height: 5\' 10"  (1.778 m)  Weight: 125 lb 3.2 oz (56.79 kg)  SpO2: 97%  Physical Exam  Constitutional: He appears lethargic. No distress.  Frail thin male, NAD  HENT:  Head: Normocephalic and atraumatic.  Mouth/Throat: No oropharyngeal exudate.  Neck: Normal range of motion. Neck supple.  Cardiovascular: Normal rate, regular rhythm and normal heart sounds.   Pulmonary/Chest: Effort normal.  Diminished breath sound throughout  Abdominal: Soft. Bowel sounds are normal.  Musculoskeletal: He exhibits no edema or tenderness.  Neurological: He appears lethargic.  Skin: Skin is warm and dry. He is not diaphoretic.  Psychiatric: He has a normal mood and affect.    Labs reviewed: Basic Metabolic Panel:  Recent Labs  09/03/14 0236  12/19/14 0651 12/21/14 1159 12/22/14 0641  NA 136  < > 138 143 142  K 4.6  < > 4.4 4.2 4.1  CL 90*  < > 82* 86* 84*  CO2 39*  < > 45* 45* 45*  GLUCOSE 332*  < > 213* 158* 219*  BUN 30*  < > 70* 95* 101*  CREATININE 1.31  < > 1.98* 2.53* 2.56*  CALCIUM 8.6  < > 8.8* 8.5* 8.8*  MG 1.9  --   --   --  2.5*  PHOS 3.0  --   --   --   --   < > = values in this interval not displayed. Liver Function Tests:  Recent Labs  08/31/14 0420 09/01/14 0500 12/14/14 1950  AST 24 21 19   ALT 18 15 10*  ALKPHOS 57 54 56  BILITOT 0.7 1.0 1.2  PROT 6.7 5.9* 6.9  ALBUMIN 2.8* 2.6* 3.3*   No results for input(s): LIPASE, AMYLASE in the last 8760 hours. No results for input(s): AMMONIA in the last 8760 hours. CBC:  Recent Labs   09/01/14 0500 09/02/14 0411 09/03/14 0236  12/17/14 0228 12/18/14 0312 12/19/14 0651  WBC 7.5 6.5 4.8  < > 6.5 8.7 8.5  NEUTROABS 6.1 5.2 3.8  --   --   --   --   HGB 11.8* 11.7* 11.6*  < > 10.5* 12.2* 12.1*  HCT 41.8 40.6 40.3  < > 37.7* 42.7 41.2  MCV 83.3 81.5 83.1  < > 82.7 82.6 80.2  PLT 114* 119* 117*  < > 105* 122* 108*  < > = values in this interval not displayed. TSH: No results for input(s): TSH in the last 8760 hours. A1C: Lab Results  Component Value Date   HGBA1C 7.1* 12/15/2014   Lipid Panel:  Recent Labs  12/15/14 0245  CHOL 89  HDL 42  LDLCALC 35  TRIG 60  CHOLHDL 2.1    Result Date: 01/03/15 12:14 PM      Analyte   Result Value   Ref. Range    Units   Out of Range   Lab  Sodium  142  135-145  mEq/L    SLN  Potassium  5.1  3.5-5.3  mEq/L      Chloride  92  96-112  mEq/L  L    CO2  31  19-32  mEq/L      Glucose  99  70-99  mg/dL      BUN  181  6-23  mg/dL  H     Result repeated and verified. Result confirmed by automatic dilution.  Creatinine  7.99  0.50-1.35  mg/dL  H     Result repeated and verified.  Calcium  8.8    Assessment/Plan  1. Acute on chronic respiratory failure with hypercapnia In the setting of CHF, COPD, pulmonary HTN.  Family and pt have decided against aggressive treatment, pt is a DNR, comfort measure have already been implemented, stopping all routine medication and starting ativan and Roxanol for pain and anxiety management  -will consult Hospice at this time for ongoing and further support for pt and family  2. Acute renal failure, unspecified acute renal failure type Cr and BUN treading up in hospital, repeat blood work revealed much worse renal function, pt and daughter opted not to go back to hospital for further treatment.   3. COPD GOLD III Advanced COPD    Obinna Ehresman K. Harle Battiest  [facility] [phone] 8 am - 5 pm) [phone] (after hours)

## 2015-01-06 ENCOUNTER — Ambulatory Visit: Payer: PPO | Admitting: Internal Medicine

## 2015-01-06 ENCOUNTER — Telehealth: Payer: Self-pay | Admitting: *Deleted

## 2015-01-06 NOTE — Telephone Encounter (Signed)
Received call from nursing staff, Mariane Baumgarten, at [place] facility requesting patient's ICD be deactivated. Advised to send fax requesting order for Dr. Caryl Comes to sign upon his arrival. [employer] contacted- representative travelling toward nursing facility to turn ICD off once order received. SK signed order- faxed back to nursing facility (2 different fax numbers). Kenney Houseman, nursing supervisor and facility NP called and made aware that fax was sent and a rep should be there shortly.

## 2015-01-06 NOTE — Telephone Encounter (Signed)
Confirmed with [employer] representative- ICD disabled.

## 2015-01-10 ENCOUNTER — Telehealth: Payer: Self-pay | Admitting: *Deleted

## 2015-01-11 NOTE — Telephone Encounter (Signed)
error 

## 2015-01-30 DEATH — deceased

## 2015-11-08 IMAGING — CR DG CHEST 1V PORT
1 series · 1 of 1 positions shown · non-contrast
Comparison: 1 day prior

CLINICAL DATA: Respiratory failure, acute on chronic.

EXAM:
PORTABLE CHEST - 1 VIEW

[AP]
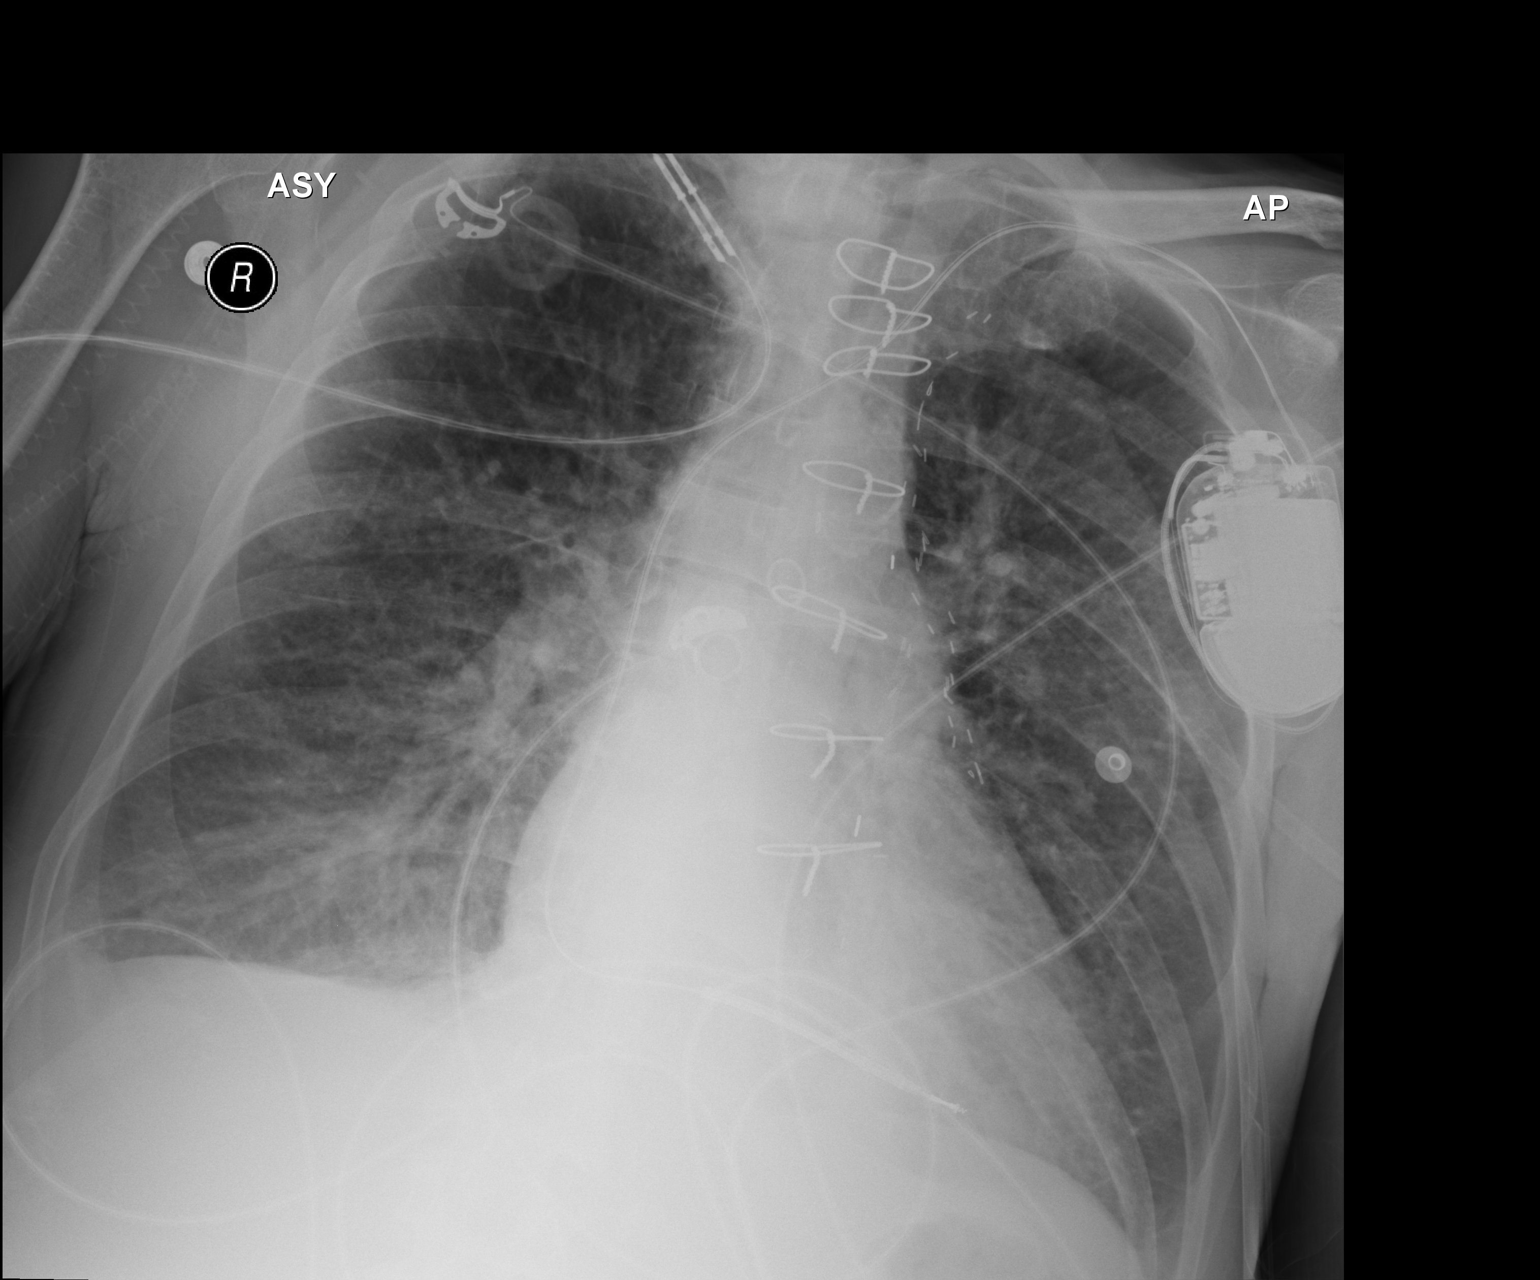

[1 of 1 positions shown; findings below may reference images not displayed]

FINDINGS: Pacer/AICD with tip at right ventricle. Patient rotated left. Prior
median sternotomy. Midline trachea. Mild cardiomegaly. Trace right
pleural fluid. No pneumothorax. Skin fold over the left hemi thorax
laterally. New or increased pulmonary interstitial prominence and
indistinctness. Patchy bibasilar airspace disease.
IMPRESSION: Developing mild interstitial edema.

Slight increase in bibasilar atelectasis.

Probable small right pleural effusion.

## 2015-11-09 IMAGING — CR DG CHEST 1V PORT
1 series · 1 of 1 positions shown · non-contrast
Comparison: Portable exam 6366 hours compared 12/15/2014

CLINICAL DATA: Acute on chronic respiratory failure, pulmonary
edema with CHF and reduced LEFT ventricular function, history
hypertension, ischemic cardiomyopathy, diabetes mellitus, colon
cancer, smoking, COPD, pulmonary hypertension

EXAM:
PORTABLE CHEST - 1 VIEW

[AP]
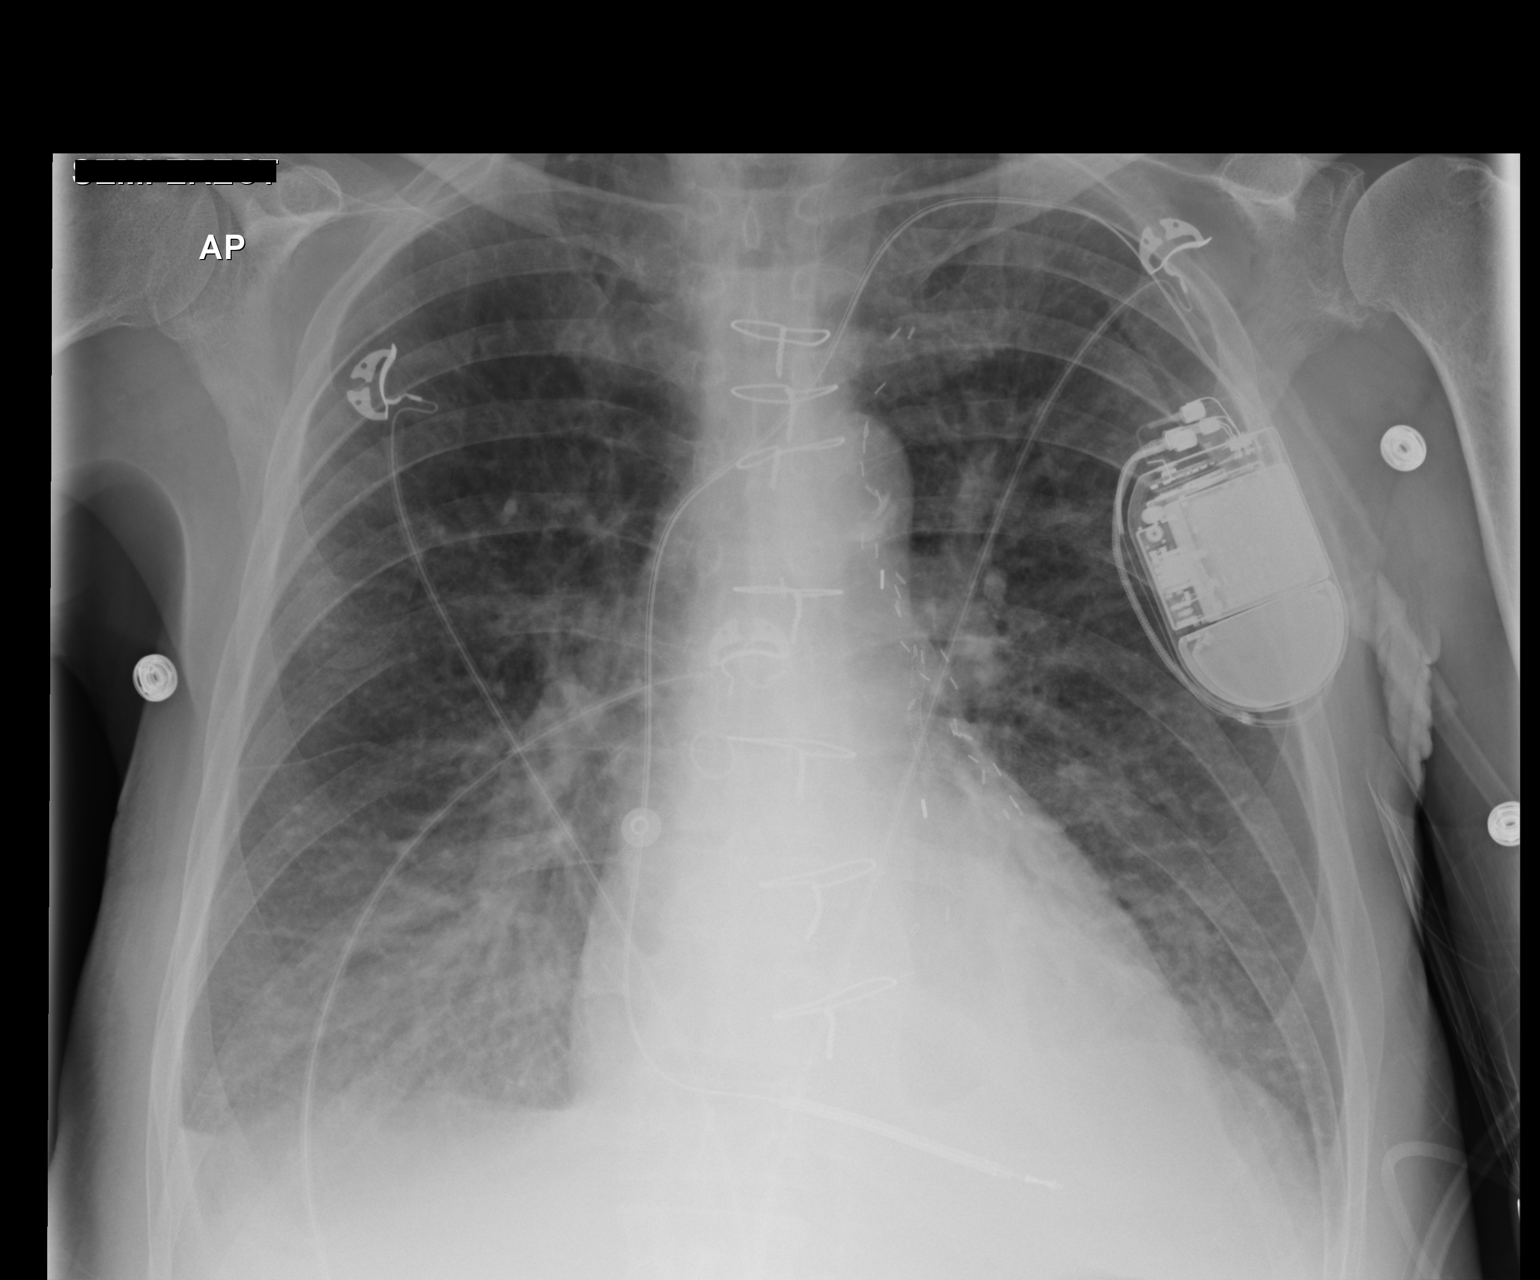

[1 of 1 positions shown; findings below may reference images not displayed]

FINDINGS: LEFT subclavian AICD lead tip projects over RIGHT ventricle.

Borderline enlargement of cardiac silhouette post CABG.

Slight pulmonary vascular congestion.

Mild perihilar infiltrates likely pulmonary edema, little changed.

Small RIGHT pleural effusion.

No pneumothorax.
IMPRESSION: Mild persistent CHF.

## 2015-11-09 IMAGING — CT CT HEAD W/O CM
1 of 2 series · 16 of 30 positions shown, 20 images · non-contrast
Comparison: 04/21/2013

CLINICAL DATA: Confusion, fall, hypoxia, history colon cancer, CHF,
diabetes mellitus, hypertension, coronary artery disease, ischemic
cardiomyopathy, smoker, COPD

EXAM:
CT HEAD WITHOUT CONTRAST
TECHNIQUE: Contiguous axial images were obtained from the base of the skull
through the vertex without intravenous contrast.

[Series 3: head 5.0 h30s · axial · 0.43mm/px · z∈[-148,-8]mm · 16 of 32 slices shown, 20 images]
[im 2/32  brain]
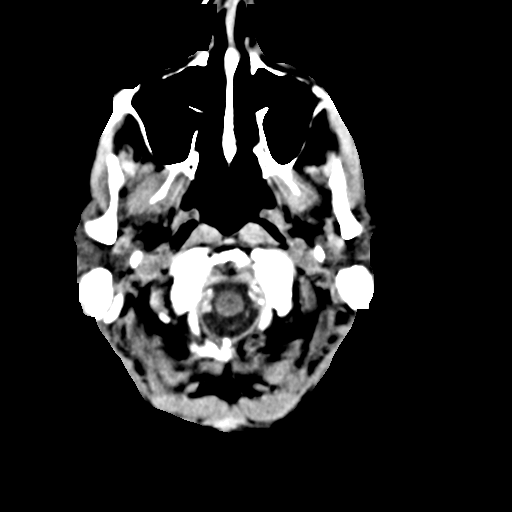
[im 2/32  bone]
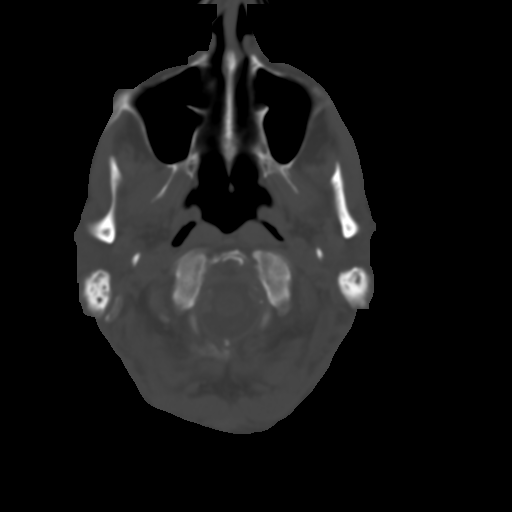
[im 3/32  brain]
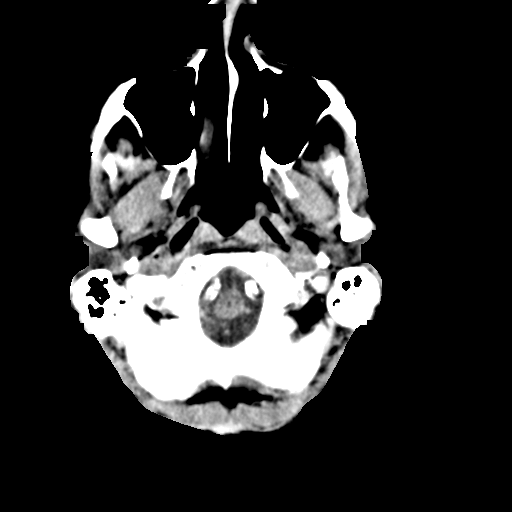
[im 6/32  brain]
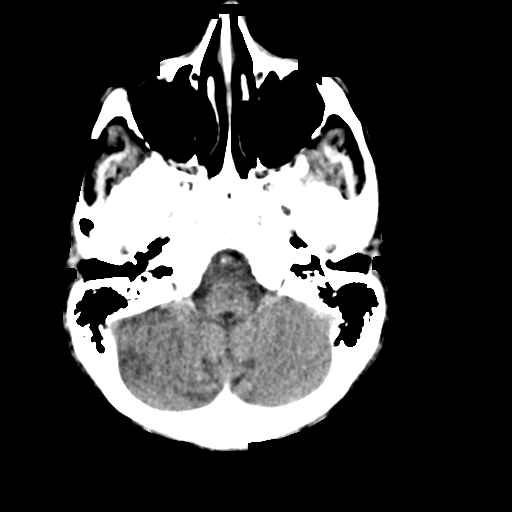
[im 8/32  brain]
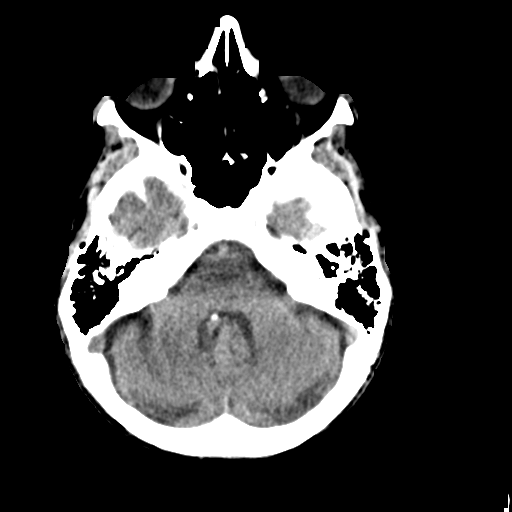
[im 9/32  brain]
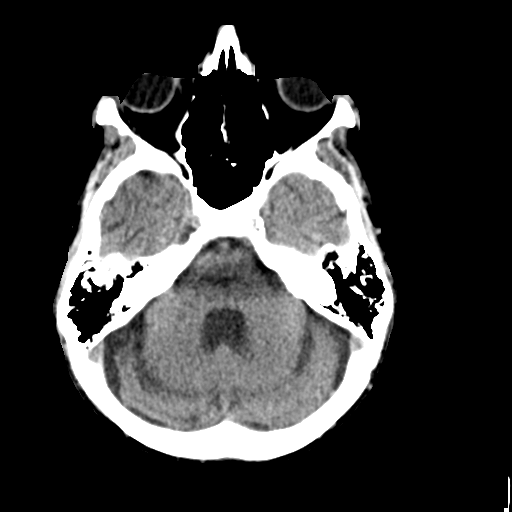
[im 9/32  bone]
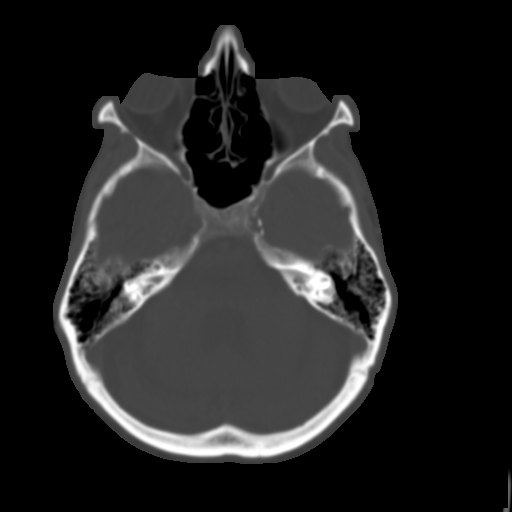
[im 11/32  brain]
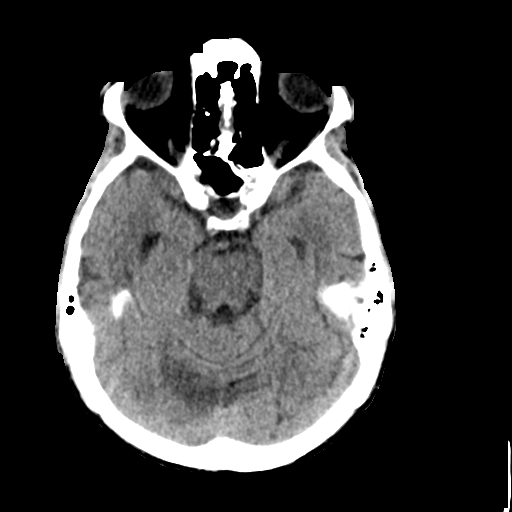
[im 14/32  brain]
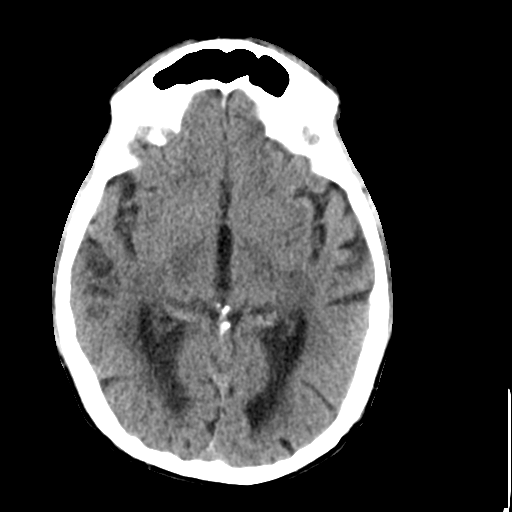
[im 15/32  brain]
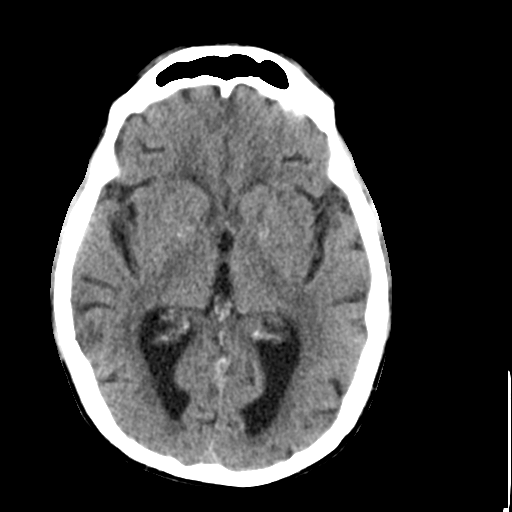
[im 17/32  brain]
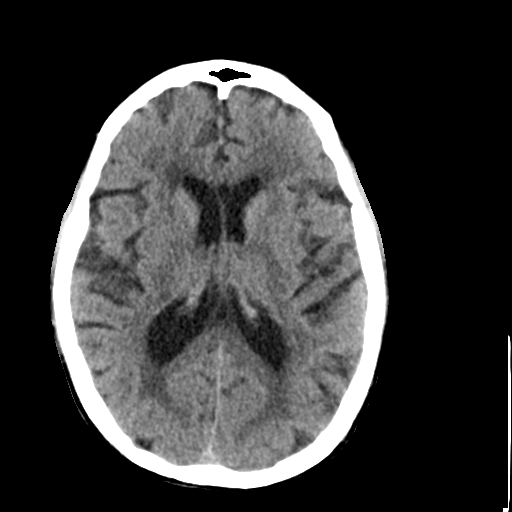
[im 17/32  bone]
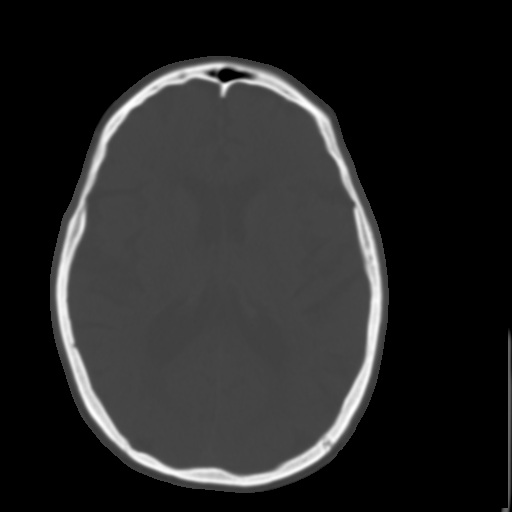
[im 18/32  brain]
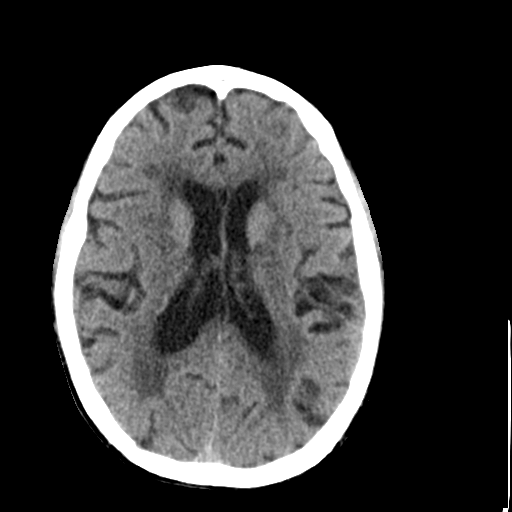
[im 21/32  brain]
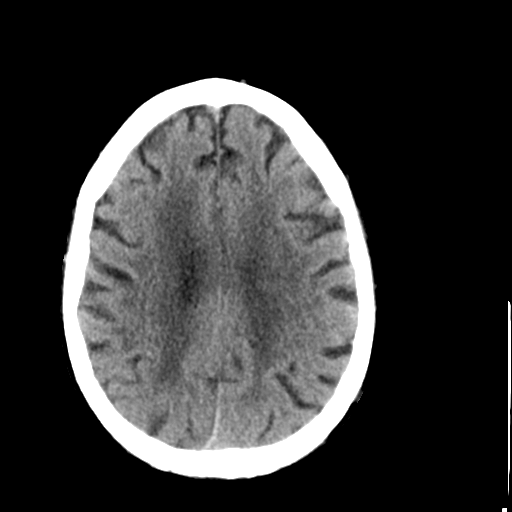
[im 23/32  brain]
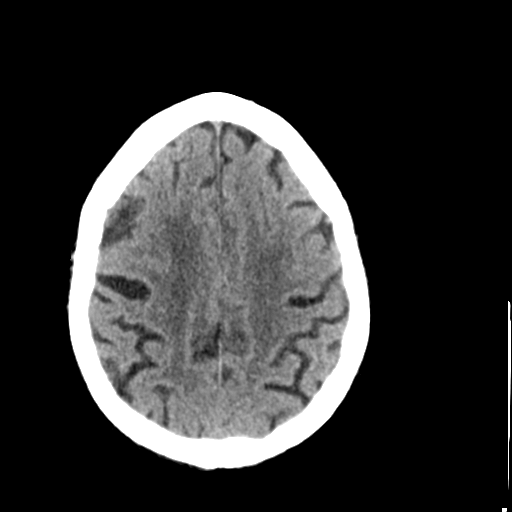
[im 24/32  brain]
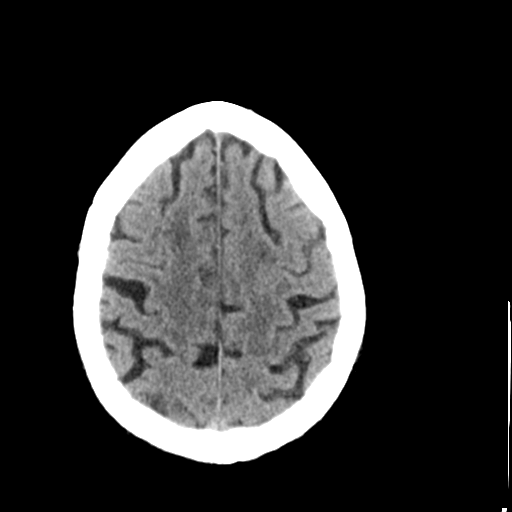
[im 24/32  bone]
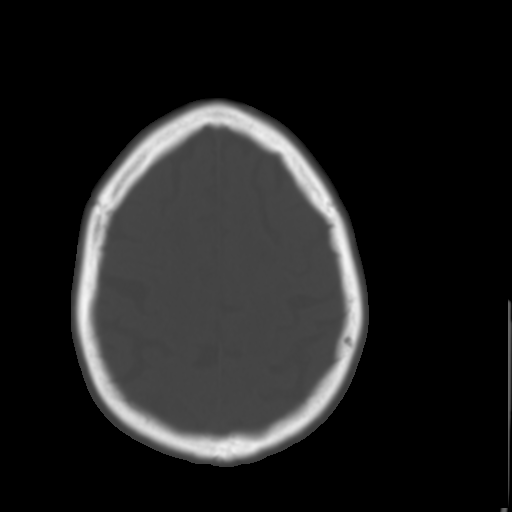
[im 26/32  brain]
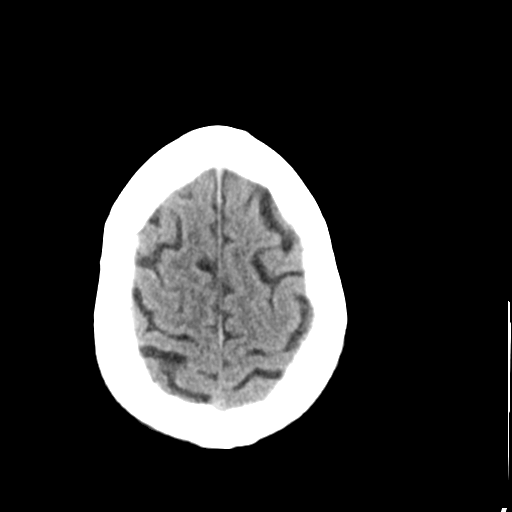
[im 29/32  brain]
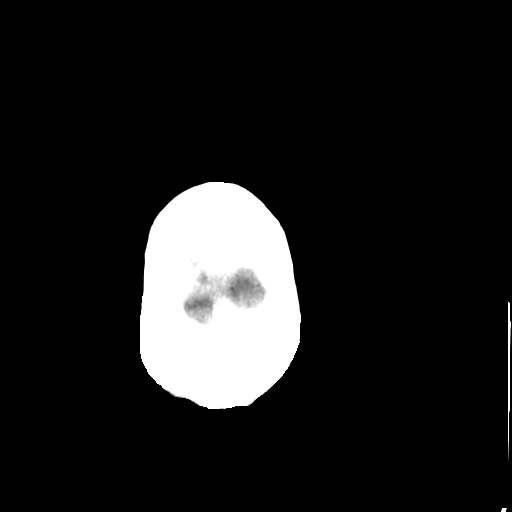
[im 30/32  brain]
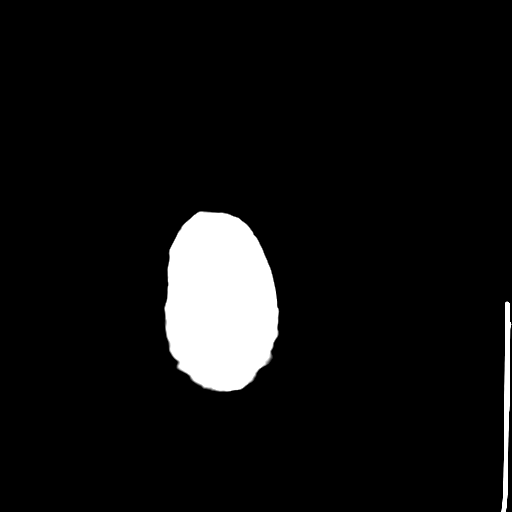

[16 of 30 positions shown; findings below may reference images not displayed]

FINDINGS: Generalized atrophy.

Normal ventricular morphology.

No midline shift or mass effect.

Small vessel chronic ischemic changes of deep cerebral white matter.

No intracranial hemorrhage, mass lesion, or acute infarction.

Visualized paranasal sinuses and mastoid air cells clear.

Bones unremarkable.

Significant atherosclerotic calcifications of the internal carotid
and vertebral arteries at skullbase.
IMPRESSION: Atrophy with small vessel chronic ischemic changes of deep cerebral
white matter.

No acute intracranial abnormalities.
